# Patient Record
Sex: Male | Born: 1960 | Race: White | Hispanic: No | Marital: Married | Smoking: Former smoker
Health system: Southern US, Community
[De-identification: ages and names within clinical notes are randomized; demographics above are authoritative.]

## PROBLEM LIST (undated history)

## (undated) DIAGNOSIS — K219 Gastro-esophageal reflux disease without esophagitis: Secondary | ICD-10-CM

## (undated) DIAGNOSIS — F1911 Other psychoactive substance abuse, in remission: Secondary | ICD-10-CM

## (undated) DIAGNOSIS — F319 Bipolar disorder, unspecified: Secondary | ICD-10-CM

## (undated) DIAGNOSIS — M1990 Unspecified osteoarthritis, unspecified site: Secondary | ICD-10-CM

## (undated) DIAGNOSIS — I10 Essential (primary) hypertension: Secondary | ICD-10-CM

## (undated) DIAGNOSIS — E785 Hyperlipidemia, unspecified: Secondary | ICD-10-CM

## (undated) HISTORY — PX: COLON SURGERY: SHX602

## (undated) HISTORY — PX: CERVICAL FUSION: SHX112

## (undated) HISTORY — PX: CHOLECYSTECTOMY: SHX55

## (undated) HISTORY — PX: HERNIA REPAIR: SHX51

## (undated) HISTORY — PX: APPENDECTOMY: SHX54

## (undated) HISTORY — DX: Bipolar disorder, unspecified: F31.9

## (undated) HISTORY — DX: Unspecified osteoarthritis, unspecified site: M19.90

## (undated) HISTORY — DX: Hyperlipidemia, unspecified: E78.5

## (undated) HISTORY — DX: Gastro-esophageal reflux disease without esophagitis: K21.9

## (undated) HISTORY — DX: Essential (primary) hypertension: I10

## (undated) HISTORY — PX: ANKLE FRACTURE SURGERY: SHX122

---

## 2015-10-30 DIAGNOSIS — G43909 Migraine, unspecified, not intractable, without status migrainosus: Secondary | ICD-10-CM | POA: Insufficient documentation

## 2015-10-30 DIAGNOSIS — M5481 Occipital neuralgia: Secondary | ICD-10-CM | POA: Insufficient documentation

## 2015-10-30 DIAGNOSIS — F419 Anxiety disorder, unspecified: Secondary | ICD-10-CM | POA: Insufficient documentation

## 2015-10-30 DIAGNOSIS — F329 Major depressive disorder, single episode, unspecified: Secondary | ICD-10-CM | POA: Insufficient documentation

## 2015-10-30 DIAGNOSIS — M542 Cervicalgia: Secondary | ICD-10-CM | POA: Insufficient documentation

## 2015-10-30 DIAGNOSIS — R05 Cough: Secondary | ICD-10-CM | POA: Diagnosis not present

## 2015-10-30 DIAGNOSIS — J0140 Acute pansinusitis, unspecified: Secondary | ICD-10-CM | POA: Diagnosis not present

## 2015-10-30 DIAGNOSIS — R110 Nausea: Secondary | ICD-10-CM | POA: Insufficient documentation

## 2015-10-30 DIAGNOSIS — R0789 Other chest pain: Secondary | ICD-10-CM | POA: Diagnosis not present

## 2015-11-06 DIAGNOSIS — J189 Pneumonia, unspecified organism: Secondary | ICD-10-CM | POA: Diagnosis not present

## 2015-11-28 DIAGNOSIS — J189 Pneumonia, unspecified organism: Secondary | ICD-10-CM | POA: Diagnosis not present

## 2015-11-28 DIAGNOSIS — R05 Cough: Secondary | ICD-10-CM | POA: Diagnosis not present

## 2015-12-10 DIAGNOSIS — G4733 Obstructive sleep apnea (adult) (pediatric): Secondary | ICD-10-CM | POA: Diagnosis not present

## 2015-12-10 DIAGNOSIS — J181 Lobar pneumonia, unspecified organism: Secondary | ICD-10-CM | POA: Diagnosis not present

## 2015-12-10 DIAGNOSIS — R0602 Shortness of breath: Secondary | ICD-10-CM | POA: Diagnosis not present

## 2015-12-10 DIAGNOSIS — J986 Disorders of diaphragm: Secondary | ICD-10-CM | POA: Diagnosis not present

## 2015-12-13 DIAGNOSIS — Q791 Other congenital malformations of diaphragm: Secondary | ICD-10-CM | POA: Diagnosis not present

## 2015-12-13 DIAGNOSIS — J986 Disorders of diaphragm: Secondary | ICD-10-CM | POA: Diagnosis not present

## 2015-12-27 DIAGNOSIS — I10 Essential (primary) hypertension: Secondary | ICD-10-CM | POA: Diagnosis not present

## 2015-12-27 DIAGNOSIS — E785 Hyperlipidemia, unspecified: Secondary | ICD-10-CM | POA: Diagnosis not present

## 2015-12-27 DIAGNOSIS — Z0000 Encounter for general adult medical examination without abnormal findings: Secondary | ICD-10-CM | POA: Diagnosis not present

## 2016-01-02 DIAGNOSIS — Z125 Encounter for screening for malignant neoplasm of prostate: Secondary | ICD-10-CM | POA: Diagnosis not present

## 2016-01-02 DIAGNOSIS — R69 Illness, unspecified: Secondary | ICD-10-CM | POA: Diagnosis not present

## 2016-01-02 DIAGNOSIS — J309 Allergic rhinitis, unspecified: Secondary | ICD-10-CM | POA: Diagnosis not present

## 2016-01-02 DIAGNOSIS — Z0000 Encounter for general adult medical examination without abnormal findings: Secondary | ICD-10-CM | POA: Diagnosis not present

## 2016-01-02 DIAGNOSIS — K219 Gastro-esophageal reflux disease without esophagitis: Secondary | ICD-10-CM | POA: Diagnosis not present

## 2016-01-02 DIAGNOSIS — E785 Hyperlipidemia, unspecified: Secondary | ICD-10-CM | POA: Diagnosis not present

## 2016-01-02 DIAGNOSIS — K589 Irritable bowel syndrome without diarrhea: Secondary | ICD-10-CM | POA: Diagnosis not present

## 2016-01-02 DIAGNOSIS — I10 Essential (primary) hypertension: Secondary | ICD-10-CM | POA: Diagnosis not present

## 2016-01-02 DIAGNOSIS — G4733 Obstructive sleep apnea (adult) (pediatric): Secondary | ICD-10-CM | POA: Diagnosis not present

## 2016-03-19 DIAGNOSIS — R69 Illness, unspecified: Secondary | ICD-10-CM | POA: Diagnosis not present

## 2016-03-19 DIAGNOSIS — Z79899 Other long term (current) drug therapy: Secondary | ICD-10-CM | POA: Diagnosis not present

## 2016-05-04 ENCOUNTER — Encounter: Payer: Self-pay | Admitting: Physician Assistant

## 2016-05-04 ENCOUNTER — Ambulatory Visit (INDEPENDENT_AMBULATORY_CARE_PROVIDER_SITE_OTHER): Payer: Self-pay | Admitting: Physician Assistant

## 2016-05-04 VITALS — BP 123/77 | HR 107 | Ht 71.0 in | Wt 228.0 lb

## 2016-05-04 DIAGNOSIS — M62830 Muscle spasm of back: Secondary | ICD-10-CM

## 2016-05-04 DIAGNOSIS — M545 Low back pain: Secondary | ICD-10-CM

## 2016-05-04 MED ORDER — KETOROLAC TROMETHAMINE 60 MG/2ML IM SOLN
60.0000 mg | Freq: Once | INTRAMUSCULAR | Status: AC
Start: 2016-05-04 — End: 2016-05-04
  Administered 2016-05-04: 60 mg via INTRAMUSCULAR

## 2016-05-04 MED ORDER — METHYLPREDNISOLONE ACETATE 40 MG/ML IJ SUSP
40.0000 mg | Freq: Once | INTRAMUSCULAR | Status: AC
Start: 2016-05-04 — End: 2016-05-04
  Administered 2016-05-04: 40 mg via INTRAMUSCULAR

## 2016-05-04 MED ORDER — METHYLPREDNISOLONE SODIUM SUCC 125 MG IJ SOLR
125.0000 mg | Freq: Once | INTRAMUSCULAR | Status: AC
  Administered 2016-05-04: 125 mg via INTRAMUSCULAR

## 2016-05-04 MED ORDER — MELOXICAM 15 MG PO TABS: 15.0000 mg | ORAL_TABLET | Freq: Every day | ORAL | 1 refills | Status: DC

## 2016-05-04 NOTE — Patient Instructions (Addendum)
3 shots given today.  Consider massage, heat, ice, biofreeze.  Start [employer]. Can take with tylenol up to 4000mg  daily.  Do stretches/exercises 2-3 times a day.  No work until Friday.

## 2016-05-04 NOTE — Progress Notes (Signed)
Subjective:    Patient ID: Jake [name], male    DOB: 02-04-1961, 55 y.o.   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 Patient works for [employer] and does do a lot of lifting. On Thursday approximately 5 days ago he was doing some lifting and he felt a twinge in his left lower back. It went away and he continued working. 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 The pain gradually got worse during his shift. That night his whole left side radiating into his left posterior leg and into his calf worsen. At rest there is little to no pain. With any twisting or movement or lifting of left leg there is pain. He has some numbness and tingling radiating down his left leg but he does have a history of neuropathy. He denies any bowel or bladder dysfunction. He denies any saddle anesthesia. He is taking Tylenol regularly for the last 2 days with little relief.    .. Active Ambulatory Problems    Diagnosis Date Noted  . Low back pain 05/05/2016  . Muscle spasm of back 05/05/2016   Resolved Ambulatory Problems    Diagnosis Date Noted  . No Resolved Ambulatory Problems   Past Medical History:  Diagnosis Date  . Bipolar disorder ([place])   . GERD (gastroesophageal reflux disease)   . Hyperlipidemia   . Hypertension    .[name] Kitchen Family History  Problem Relation Age of Onset  . Cancer Mother     breast  . Depression Mother   . Hyperlipidemia Mother   . Alcohol abuse Father   . Cancer Father     skin  . Hyperlipidemia Father   . Alzheimer's disease Father   . Cancer Maternal Grandfather   . Cancer Paternal Grandmother    .[name] Kitchen Social History   Social History  . Marital status: Married    Spouse name: N/A  . Number of  children: N/A  . Years of education: N/A   Occupational History  . Not on file.   Social History Main Topics  . Smoking status: Former Research scientist (life sciences)  . Smokeless tobacco: Never Used  . Alcohol use No  . Drug use: No  . Sexual activity: Yes   Other Topics Concern  . Not on file   Social History Narrative  . No narrative on file     Review of Systems See HPI.     Objective:   Physical Exam  Constitutional: He is oriented to person, place, and time. He appears well-developed and well-nourished.  HENT:  Head: Normocephalic and atraumatic.  Musculoskeletal:  Rock hard tightness and tenderness over left lower back and paraspinous muscles. No pain over lumbar spine.  Left, Straight leg test increased pain in left lower back but no radiation of pain down left leg.  ROM at waist decreased in all directions due to pain.   Neurological: He is alert and oriented to person, place, and time.  Psychiatric: He has a normal mood and affect. His behavior is normal.          Assessment & Plan:  Left lower back pain with sciatca/muscle spasms of back- will avoid all addictive drugs with patient. Oral prednisone makes patient agitated. Solumedrol  125mg , Depo Medrol 40mg  and toradol 60mg  IM given today. Tomorrow start with mobic. Discussed low back exercises to do after heating back up then icing and using biofreeze. Consider tens unit. Written out of work through Thursday. May need to consider PT.since there is some sciatica involvement and has a hx of lumbar disc herniation need to consider disc herniation . Will continue to monitor. Follow up in 2-4 weeks or as needed.

## 2016-05-05 ENCOUNTER — Encounter: Payer: Self-pay | Admitting: Physician Assistant

## 2016-05-05 DIAGNOSIS — M62830 Muscle spasm of back: Secondary | ICD-10-CM | POA: Insufficient documentation

## 2016-05-05 DIAGNOSIS — M545 Low back pain, unspecified: Secondary | ICD-10-CM | POA: Insufficient documentation

## 2016-05-19 ENCOUNTER — Encounter: Payer: Self-pay | Admitting: Physician Assistant

## 2016-05-19 DIAGNOSIS — I10 Essential (primary) hypertension: Secondary | ICD-10-CM | POA: Insufficient documentation

## 2016-05-19 DIAGNOSIS — K5730 Diverticulosis of large intestine without perforation or abscess without bleeding: Secondary | ICD-10-CM | POA: Insufficient documentation

## 2016-05-19 DIAGNOSIS — J309 Allergic rhinitis, unspecified: Secondary | ICD-10-CM | POA: Insufficient documentation

## 2016-05-19 DIAGNOSIS — E781 Pure hyperglyceridemia: Secondary | ICD-10-CM | POA: Insufficient documentation

## 2016-05-19 DIAGNOSIS — K219 Gastro-esophageal reflux disease without esophagitis: Secondary | ICD-10-CM | POA: Insufficient documentation

## 2016-05-19 DIAGNOSIS — F411 Generalized anxiety disorder: Secondary | ICD-10-CM | POA: Insufficient documentation

## 2016-05-19 DIAGNOSIS — G4733 Obstructive sleep apnea (adult) (pediatric): Secondary | ICD-10-CM | POA: Insufficient documentation

## 2016-05-19 DIAGNOSIS — Z9989 Dependence on other enabling machines and devices: Secondary | ICD-10-CM | POA: Insufficient documentation

## 2016-05-19 DIAGNOSIS — K589 Irritable bowel syndrome without diarrhea: Secondary | ICD-10-CM | POA: Insufficient documentation

## 2016-05-19 DIAGNOSIS — E785 Hyperlipidemia, unspecified: Secondary | ICD-10-CM | POA: Insufficient documentation

## 2016-05-19 DIAGNOSIS — R4586 Emotional lability: Secondary | ICD-10-CM | POA: Insufficient documentation

## 2016-06-09 ENCOUNTER — Encounter: Payer: Self-pay | Admitting: Family Medicine

## 2016-06-09 ENCOUNTER — Ambulatory Visit (INDEPENDENT_AMBULATORY_CARE_PROVIDER_SITE_OTHER): Payer: Medicare HMO

## 2016-06-09 ENCOUNTER — Ambulatory Visit (INDEPENDENT_AMBULATORY_CARE_PROVIDER_SITE_OTHER): Payer: Medicare HMO | Admitting: Family Medicine

## 2016-06-09 VITALS — BP 143/85 | HR 96 | Wt 238.0 lb

## 2016-06-09 DIAGNOSIS — M5442 Lumbago with sciatica, left side: Secondary | ICD-10-CM

## 2016-06-09 DIAGNOSIS — M4726 Other spondylosis with radiculopathy, lumbar region: Secondary | ICD-10-CM | POA: Diagnosis not present

## 2016-06-09 DIAGNOSIS — M545 Low back pain: Secondary | ICD-10-CM

## 2016-06-09 DIAGNOSIS — M5441 Lumbago with sciatica, right side: Secondary | ICD-10-CM

## 2016-06-09 MED ORDER — METHYLPREDNISOLONE ACETATE 80 MG/ML IJ SUSP
80.0000 mg | Freq: Once | INTRAMUSCULAR | Status: AC
  Administered 2016-06-09: 80 mg via INTRAMUSCULAR

## 2016-06-09 NOTE — Progress Notes (Signed)
   Subjective:    I'm seeing this patient as a consultation for:  [name], PA-C   CC: Back pain  HPI: Patient notes a several day history of left-sided low back pain radiating to the left leg. Symptoms are moderate to severe however he denies any weakness or numbness. Leg pain is worse with sitting and laying extension. He had a similar episode a month ago that was treated with in the office intramuscular steroid injections. He notes he is intolerant to high-dose long-term oral steroids due to mental health issues. He denies any injury or bladder dysfunction fevers or chills. He's tried meloxicam which did not help his current pain.  Past medical history, Surgical history, Family history not pertinant except as noted below, Social history, Allergies, and medications have been entered into the medical record, reviewed, and no changes needed.   Review of Systems: No headache, visual changes, nausea, vomiting, diarrhea, constipation, dizziness, abdominal pain, skin rash, fevers, chills, night sweats, weight loss, swollen lymph nodes, body aches, joint swelling, muscle aches, chest pain, shortness of breath, mood changes, visual or auditory hallucinations.   Objective:    Vitals:   06/09/16 1114  BP: (!) 143/85  Pulse: 96   General: Well Developed, well nourished, and in no acute distress.  Neuro/Psych: Alert and oriented x3, extra-ocular muscles intact, able to move all 4 extremities, sensation grossly intact. Skin: Warm and dry, no rashes noted.  Respiratory: Not using accessory muscles, speaking in full sentences, trachea midline.  Cardiovascular: Pulses palpable, no extremity edema. Abdomen: Does not appear distended. MSK: L spine: Nontender to midline. Tender palpation left lumbar paraspinal muscle group. Lumbar motion is limited in flexion by pain. Positive left-sided slump test. Lower extremity reflexes strength and sensation are equal and normal throughout. Normal  gait.   No results found for this or any previous visit (from the past 24 hour(s)). No results found.  Impression and Recommendations:    Assessment and Plan: 55 y.o. male with Lumbago with left-sided sciatica. 80 mg of intramuscular Depo-Medrol were given prior to discharge. Additionally we'll obtain x-ray of lumbar spine and refer to physical therapy. If worsening will return to clinic soon otherwise recheck in 4 weeks. If not better would recommend MRI for epidural steroid injection planning..   Discussed warning signs or symptoms. Please see discharge instructions. Patient expresses understanding.

## 2016-06-09 NOTE — Patient Instructions (Signed)
Thank you for coming in today. Attend PT.  Return in 4 weeks or sooner if needed.  Come back or go to the emergency room if you notice new weakness new numbness problems walking or bowel or bladder problems.   Sciatica Sciatica is pain, weakness, numbness, or tingling along the path of the sciatic nerve. The nerve starts in the lower back and runs down the back of each leg. The nerve controls the muscles in the lower leg and in the back of the knee, while also providing sensation to the back of the thigh, lower leg, and the sole of your foot. Sciatica is a symptom of another medical condition. For instance, nerve damage or certain conditions, such as a herniated disk or bone spur on the spine, pinch or put pressure on the sciatic nerve. This causes the pain, weakness, or other sensations normally associated with sciatica. Generally, sciatica only affects one side of the body. CAUSES   Herniated or slipped disc.  Degenerative disk disease.  A pain disorder involving the narrow muscle in the buttocks (piriformis syndrome).  Pelvic injury or fracture.  Pregnancy.  Tumor (rare). SYMPTOMS  Symptoms can vary from mild to very severe. The symptoms usually travel from the low back to the buttocks and down the back of the leg. Symptoms can include:  Mild tingling or dull aches in the lower back, leg, or hip.  Numbness in the back of the calf or sole of the foot.  Burning sensations in the lower back, leg, or hip.  Sharp pains in the lower back, leg, or hip.  Leg weakness.  Severe back pain inhibiting movement. These symptoms may get worse with coughing, sneezing, laughing, or prolonged sitting or standing. Also, being overweight may worsen symptoms. DIAGNOSIS  Your caregiver will perform a physical exam to look for common symptoms of sciatica. He or she may ask you to do certain movements or activities that would trigger sciatic nerve pain. Other tests may be performed to find the cause  of the sciatica. These may include:  Blood tests.  X-rays.  Imaging tests, such as an MRI or CT scan. TREATMENT  Treatment is directed at the cause of the sciatic pain. Sometimes, treatment is not necessary and the pain and discomfort goes away on its own. If treatment is needed, your caregiver may suggest:  Over-the-counter medicines to relieve pain.  Prescription medicines, such as anti-inflammatory medicine, muscle relaxants, or narcotics.  Applying heat or ice to the painful area.  Steroid injections to lessen pain, irritation, and inflammation around the nerve.  Reducing activity during periods of pain.  Exercising and stretching to strengthen your abdomen and improve flexibility of your spine. Your caregiver may suggest losing weight if the extra weight makes the back pain worse.  Physical therapy.  Surgery to eliminate what is pressing or pinching the nerve, such as a bone spur or part of a herniated disk. HOME CARE INSTRUCTIONS   Only take over-the-counter or prescription medicines for pain or discomfort as directed by your caregiver.  Apply ice to the affected area for 20 minutes, 3-4 times a day for the first 48-72 hours. Then try heat in the same way.  Exercise, stretch, or perform your usual activities if these do not aggravate your pain.  Attend physical therapy sessions as directed by your caregiver.  Keep all follow-up appointments as directed by your caregiver.  Do not wear high heels or shoes that do not provide proper support.  Check your mattress to  see if it is too soft. A firm mattress may lessen your pain and discomfort. SEEK IMMEDIATE MEDICAL CARE IF:   You lose control of your bowel or bladder (incontinence).  You have increasing weakness in the lower back, pelvis, buttocks, or legs.  You have redness or swelling of your back.  You have a burning sensation when you urinate.  You have pain that gets worse when you lie down or awakens you at  night.  Your pain is worse than you have experienced in the past.  Your pain is lasting longer than 4 weeks.  You are suddenly losing weight without reason. MAKE SURE YOU:  Understand these instructions.  Will watch your condition.  Will get help right away if you are not doing well or get worse.   This information is not intended to replace advice given to you by your health care provider. Make sure you discuss any questions you have with your health care provider.   Document Released: 08/18/2001 Document Revised: 05/15/2015 Document Reviewed: 01/03/2012 Elsevier Interactive Patient Education [employer].

## 2016-06-14 DIAGNOSIS — R69 Illness, unspecified: Secondary | ICD-10-CM | POA: Diagnosis not present

## 2016-06-16 ENCOUNTER — Other Ambulatory Visit: Payer: Self-pay | Admitting: *Deleted

## 2016-06-16 MED ORDER — SODIUM BICARBONATE 650 MG PO TABS: ORAL_TABLET | ORAL | 3 refills | Status: DC

## 2016-06-26 ENCOUNTER — Encounter: Payer: Self-pay | Admitting: Rehabilitative and Restorative Service Providers"

## 2016-06-26 ENCOUNTER — Ambulatory Visit (INDEPENDENT_AMBULATORY_CARE_PROVIDER_SITE_OTHER): Payer: Medicare HMO | Admitting: Rehabilitative and Restorative Service Providers"

## 2016-06-26 DIAGNOSIS — M545 Low back pain: Secondary | ICD-10-CM

## 2016-06-26 DIAGNOSIS — R29898 Other symptoms and signs involving the musculoskeletal system: Secondary | ICD-10-CM

## 2016-06-26 NOTE — Patient Instructions (Signed)
Abdominal Bracing With Pelvic Floor (Hook-Lying)    With neutral spine, tighten pelvic floor and abdominals sucking belly button to back bone; tighten muscles in LB at waist. Hold 10 sec  Repeat _10__ times. Do __several_ times a day. Progress to do this in sitting; standing; walking and with functional activities.  Trunk: Prone Extension (Press-Ups)    Lie on stomach on firm, flat surface. Relax bottom and legs. Raise chest in air with elbows straight. Keep hips flat on surface, sag stomach. Hold __2-3__ seconds. Repeat _10___ times. Do _4-5___ sessions per day. CAUTION: Movement should be gentle and slow. Do this if LE pain increases   Elbow Prop (Extension)    Prop body up on elbows for _1-2 minutes. Slowly lower it. Do __2-3 __ sessions per day.   Backward Bend (Standing)    Arch backward to make hollow of back deeper. Hold _2-3___ seconds. Repeat __-3__ times per set. Do _several___ sessions per day.   TENS UNIT: This is helpful for muscle pain and spasm.   Search and Purchase a TENS 7000 2nd edition at www.tenspros.com. It should be less than $30.     TENS unit instructions: Do not shower or bathe with the unit on Turn the unit off before removing electrodes or batteries If the electrodes lose stickiness add a drop of water to the electrodes after they are disconnected from the unit and place on plastic sheet. If you continued to have difficulty, call the TENS unit company to purchase more electrodes. Do not apply lotion on the skin area prior to use. Make sure the skin is clean and dry as this will help prolong the life of the electrodes. After use, always check skin for unusual red areas, rash or other skin difficulties. If there are any skin problems, does not apply electrodes to the same area. Never remove the electrodes from the unit by pulling the wires. Do not use the TENS unit or electrodes other than as directed. Do not change electrode placement without  consultating your therapist or physician. Keep 2 fingers with between each electrode.   Sleeping on Back  Place pillow under knees. A pillow with cervical support and a roll around waist are also helpful. Copyright  VHI. All rights reserved.  Sleeping on Side Place pillow between knees. Use cervical support under neck and a roll around waist as needed. Copyright  VHI. All rights reserved.   Sleeping on Stomach   If this is the only desirable sleeping position, place pillow under lower legs, and under stomach or chest as needed.  Posture - Sitting   Sit upright, head facing forward. Try using a roll to support lower back. Keep shoulders relaxed, and avoid rounded back. Keep hips level with knees. Avoid crossing legs for long periods. Stand to Sit / Sit to Stand   To sit: Bend knees to lower self onto front edge of chair, then scoot back on seat. To stand: Reverse sequence by placing one foot forward, and scoot to front of seat. Use rocking motion to stand up.   Work Height and Reach  Ideal work height is no more than 2 to 4 inches below elbow level when standing, and at elbow level when sitting. Reaching should be limited to arm's length, with elbows slightly bent.  Bending  Bend at hips and knees, not back. Keep feet shoulder-width apart.    Posture - Standing   Good posture is important. Avoid slouching and forward head thrust. Maintain curve in low  back and align ears over shoul- ders, hips over ankles.  Alternating Positions   Alternate tasks and change positions frequently to reduce fatigue and muscle tension. Take rest breaks. Computer Work   Position work to Programmer, multimedia. Use proper work and seat height. Keep shoulders back and down, wrists straight, and elbows at right angles. Use chair that provides full back support. Add footrest and lumbar roll as needed.  Getting Into / Out of Car  Lower self onto seat, scoot back, then bring in one leg at a time.  Reverse sequence to get out.  Dressing  Lie on back to pull socks or slacks over feet, or sit and bend leg while keeping back straight.    Housework - Sink  Place one foot on ledge of cabinet under sink when standing at sink for prolonged periods.   Pushing / Pulling  Pushing is preferable to pulling. Keep back in proper alignment, and use leg muscles to do the work.  Deep Squat   Squat and lift with both arms held against upper trunk. Tighten stomach muscles without holding breath. Use smooth movements to avoid jerking.  Avoid Twisting   Avoid twisting or bending back. Pivot around using foot movements, and bend at knees if needed when reaching for articles.  Carrying Luggage   Distribute weight evenly on both sides. Use a cart whenever possible. Do not twist trunk. Move body as a unit.   Lifting Principles .Maintain proper posture and head alignment. .Slide object as close as possible before lifting. .Move obstacles out of the way. .Test before lifting; ask for help if too heavy. .Tighten stomach muscles without holding breath. .Use smooth movements; do not jerk. .Use legs to do the work, and pivot with feet. .Distribute the work load symmetrically and close to the center of trunk. .Push instead of pull whenever possible.   Ask For Help   Ask for help and delegate to others when possible. Coordinate your movements when lifting together, and maintain the low back curve.  Log Roll   Lying on back, bend left knee and place left arm across chest. Roll all in one movement to the right. Reverse to roll to the left. Always move as one unit. Housework - Sweeping  Use long-handled equipment to avoid stooping.   Housework - Wiping  Position yourself as close as possible to reach work surface. Avoid straining your back.  Laundry - Unloading Wash   To unload small items at bottom of washer, lift leg opposite to arm being used to reach.  [place] close to area to be raked. Use arm movements to do the work. Keep back straight and avoid twisting.     Cart  When reaching into cart with one arm, lift opposite leg to keep back straight.   Getting Into / Out of Bed  Lower self to lie down on one side by raising legs and lowering head at the same time. Use arms to assist moving without twisting. Bend both knees to roll onto back if desired. To sit up, start from lying on side, and use same move-ments in reverse. Housework - Vacuuming  Hold the vacuum with arm held at side. Step back and forth to move it, keeping head up. Avoid twisting.   Laundry - IT consultant so that bending and twisting can be avoided.   Laundry - Unloading Dryer  Squat down to reach into clothes dryer or use a reacher.  Gardening - Weeding / Probation officer or Kneel. Knee pads may be helpful.

## 2016-06-26 NOTE — Therapy (Addendum)
[place], [place], [address] Phone: [phone]   Fax:  [phone]  Physical Therapy Evaluation  Patient Details  Name: Jake [name] MRN: [id number] Date of Birth: 1960/12/23 Referring Provider: Dr Lynne Leader   Encounter Date: 06/26/2016      PT End of Session - 06/26/16 1448    Visit Number 1   Number of Visits 12   Date for PT Re-Evaluation 08/07/16   PT Start Time 7824   PT Stop Time 1448   PT Time Calculation (min) 61 min   Activity Tolerance Patient tolerated treatment well      Past Medical History:  Diagnosis Date  . Bipolar disorder ([place])   . GERD (gastroesophageal reflux disease)   . Hyperlipidemia   . Hypertension     Past Surgical History:  Procedure Laterality Date  . ANKLE FRACTURE SURGERY Left   . APPENDECTOMY    . CERVICAL FUSION    . CHOLECYSTECTOMY    . COLON SURGERY    . HERNIA REPAIR      There were no vitals filed for this visit.       Subjective Assessment - 06/26/16 1350    Subjective Patient reports 3-4 week history of LBP and Lt LE pain after lifting something wrong at work. He felt a pop and then experienced pain from the LB into the back of both legs - was just in the Lt LE but is now in both.    Pertinent History HNP x2 lumbar spine ~ 12 years ago; cervical disc surgery C6/7 fusion ~15 yrs ago; gall bladder colon; 2 hernia surgeries within the past 10 years; Lt ankle surgery 1982; peripherial neuropathy bilat feet x 3 yrs no known cause    How long can you sit comfortably? 5-10 min    How long can you stand comfortably? 1 hour    How long can you walk comfortably? 2 hours    Diagnostic tests xrays    Patient Stated Goals get rid of the pain    Currently in Pain? Yes   Pain Score 5    Pain Location Back   Pain Orientation Lower;Mid   Pain Descriptors / Indicators Throbbing   Pain Type Acute pain   Pain Radiating Towards into both buttocks and  posterior thighs to knees    Pain Onset More than a month ago   Pain Frequency Constant   Aggravating Factors  sitting; bend forward; lifting; holding weight in front away from body   Pain Relieving Factors heat; tylenol             [facility] PT Assessment - 06/26/16 0001      Assessment   Medical Diagnosis LBP; bilat LE pain    Referring Provider Dr Lynne Leader    Onset Date/Surgical Date 05/27/16   Hand Dominance Right   Next MD Visit 07/07/16   Prior Therapy yes years ago for HNP      Precautions   Precautions None;Other (comment)   Precaution Comments no heavy lifting      Balance Screen   Has the patient fallen in the past 6 months No   Has the patient had a decrease in activity level because of a fear of falling?  No   Is the patient reluctant to leave their home because of a fear of falling?  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 Standing - head forward; shoudlers rounded and elevated; increased thoracic kyphosis; scapulae abducted and rotated along the thoracic wall; slightly flexed forward at hips      AROM   Overall AROM Comments tightness noted through end ranges bilat hips    AROM Assessment Site --  radiating pain bilat LE's with fwd flex; lbp with ext    Lumbar Flexion 60%   Lumbar Extension 50%   Lumbar - Right Side Bend 65%   Lumbar - Left Side Bend 60%   Lumbar - Right Rotation 30%   Lumbar - Left Rotation 20%     Strength   Overall  Strength Comments 5/5 bilat LE's pain with hip extension    Strength Assessment Site --  poor core/abdominal bracing      Flexibility   Hamstrings tight with LE pain at ~ 65 deg 60 deg Lt    Quadriceps tight ~ 115 deg bilat with increase in LBP    ITB tight bilat with increase in LBP    Piriformis tight Lt > Rt      Palpation   Spinal mobility pain with CPA and UPA - L1 to L5    Palpation comment pain with palpation bilat lumbar and QL; Lt piriformis and hip abductors      Special Tests    Special Tests --  (+) SLR at 65 deg Rt; 60 deg LT; pain w/ figure 4                   [facility] Adult PT Treatment/Exercise - 06/26/16 0001      Therapeutic Activites    Therapeutic Activities --  education re sitting & lying positions/spine care initiated     Lumbar Exercises: Stretches   Standing Extension --  3 reps x 2-3 sec    Prone on Elbows Stretch --  2-3 min x 1   Press Ups --  2-3 sec x 10      Lumbar Exercises: Supine   Ab Set --  trial of 3 part core - patient has difficulty w/ ab bracing   AB Set Limitations unable to preform core stabilization with mult trials including verbal and tactile cues - will continue to try belly button to back bone at home  - no pain with efforts but he does hold his breath      Moist Heat Therapy   Number Minutes Moist Heat 20 Minutes   Moist Heat Location Lumbar Spine     Electrical Stimulation   Electrical Stimulation Location bilat lumbar   Electrical Stimulation Action IFC   Electrical Stimulation Parameters to tolerance   Electrical Stimulation Goals Pain                PT Education - 06/26/16 1445    Education provided Yes   Education Details HEP - extension program; TENS; back care    Person(s) Educated Patient   Methods Explanation;Demonstration;Tactile cues;Verbal cues;Handout   Comprehension Verbalized understanding;Returned demonstration;Verbal cues required;Tactile cues required  PT Long  Term Goals - 06/26/16 1502      PT LONG TERM GOAL #1   Title Imrprove core stabilization allowing patient to preform 10-15 min of core stabilization exercises without difficulty 08/07/16   Time 6   Period Weeks   Status New     PT LONG TERM GOAL #2   Title Improve sitting tolerance to 20-30 min with no increase in radicular symptoms 08/07/16   Time 6   Period Weeks   Status New     PT LONG TERM GOAL #3   Title Decrease radicular LE pain with patient to report resolution of bilat buttock and posterior thigh pain 80-90% of the day 08/07/16   Time 6   Period Weeks   Status New     PT LONG TERM GOAL #4   Title Independent in HEP and proper back care 08/07/16   Time 6   Period Weeks   Status New     PT LONG TERM GOAL #5   Title Improve FOTO to </= 31% limitation 08/07/16   Time 6   Period Weeks   Status New               Plan - 06/26/16 1449    Clinical Impression Statement Patient presents with signs and symptoms consistent with lumbar dysfunction which is discogenic in nature. He has a history of recurrent intermittent LBP in the past; limited trunk and LE mobilty and ROM; pain with spring testing lumbar spine; pain at rest and with functional activities.    Rehab Potential Good   PT Frequency 2x / week   PT Duration 6 weeks   PT Treatment/Interventions Patient/family education;ADLs/Self Care Home Management;Cryotherapy;Electrical Stimulation;Iontophoresis 73m/ml Dexamethasone;Moist Heat;Traction;Ultrasound;Dry needling;Manual techniques;Therapeutic activities;Therapeutic exercise   PT Next Visit Plan progress with extension program; core stabilization; manaul work as indicated; consider trial of lumbar traction if radicular symptoms have not responded to extension and neutral spine rest   Consulted and Agree with Plan of Care Patient      Patient will benefit from skilled therapeutic intervention in order to improve the following deficits and impairments:  Postural  dysfunction, Improper body mechanics, Pain, Increased muscle spasms, Decreased range of motion, Decreased mobility, Decreased activity tolerance  Visit Diagnosis: Acute bilateral low back pain, with sciatica presence unspecified - Plan: PT plan of care cert/re-cert  Other symptoms and signs involving the musculoskeletal system - Plan: PT plan of care cert/re-cert     Problem List Patient Active Problem List   Diagnosis Date Noted  . Hyperlipidemia 05/19/2016  . Hypertriglyceridemia 05/19/2016  . Diverticulosis of colon without hemorrhage 05/19/2016  . IBS (irritable bowel syndrome) 05/19/2016  . Essential hypertension, benign 05/19/2016  . GERD (gastroesophageal reflux disease) 05/19/2016  . Allergic rhinitis 05/19/2016  . OSA on CPAP 05/19/2016  . Generalized anxiety disorder 05/19/2016  . Mood changes ([place] 05/19/2016  . Low back pain 05/05/2016  . Muscle spasm of back 05/05/2016    Jake [name] PNilda SimmerPT, MPH  06/26/2016, 3:09 PM  [facility] [address] [address]Phone: [phone]  Fax:  [phone] Name: Jake FortinMRN: [id number] of Birth: [birth year] PHYSICAL THERAPY DISCHARGE SUMMARY  Visits from Start of Care: Evaluation only  Current functional level related to goals / functional outcomes: Unchanged    Remaining deficits: Unchanged    Education / Equipment: Initial home exercise program Plan: Patient agrees to discharge.  Patient goals were not  met. Patient is being discharged due to not returning since the last visit.  ?????    Jake Gutierres P. Helene Kelp PT, MPH 08/19/16 10:07 AM

## 2016-07-07 ENCOUNTER — Ambulatory Visit: Payer: Medicare HMO | Admitting: Family Medicine

## 2016-07-22 ENCOUNTER — Other Ambulatory Visit: Payer: Self-pay | Admitting: Physician Assistant

## 2016-07-28 ENCOUNTER — Other Ambulatory Visit: Payer: Self-pay | Admitting: *Deleted

## 2016-07-28 ENCOUNTER — Telehealth: Payer: Self-pay | Admitting: *Deleted

## 2016-07-28 MED ORDER — PANTOPRAZOLE SODIUM 40 MG PO TBEC: 40.0000 mg | DELAYED_RELEASE_TABLET | Freq: Every day | ORAL | 2 refills | Status: DC

## 2016-07-28 MED ORDER — LOSARTAN POTASSIUM 50 MG PO TABS: 50.0000 mg | ORAL_TABLET | Freq: Every day | ORAL | 2 refills | Status: DC

## 2016-07-28 MED ORDER — DICYCLOMINE HCL 20 MG PO TABS: 20.0000 mg | ORAL_TABLET | Freq: Three times a day (TID) | ORAL | 2 refills | Status: DC

## 2016-07-28 NOTE — Telephone Encounter (Signed)
Ok to refill for 2 months. Need med follow up before next refill to check BP.

## 2016-07-28 NOTE — Telephone Encounter (Signed)
Refill request for Dicyclomine, pantoprazole and losartan. I have to route  These and initial fill sent by provider because they are all historical medications that have not been prescribed by our office

## 2016-07-29 ENCOUNTER — Other Ambulatory Visit: Payer: Self-pay

## 2016-07-29 MED ORDER — SODIUM BICARBONATE 650 MG PO TABS: ORAL_TABLET | ORAL | 3 refills | Status: DC

## 2016-08-19 ENCOUNTER — Other Ambulatory Visit: Payer: Self-pay | Admitting: *Deleted

## 2016-08-19 MED ORDER — PANTOPRAZOLE SODIUM 40 MG PO TBEC: 40.0000 mg | DELAYED_RELEASE_TABLET | Freq: Two times a day (BID) | ORAL | 2 refills | Status: DC

## 2016-10-12 DIAGNOSIS — R69 Illness, unspecified: Secondary | ICD-10-CM | POA: Diagnosis not present

## 2016-10-31 ENCOUNTER — Other Ambulatory Visit: Payer: Self-pay | Admitting: Physician Assistant

## 2016-11-13 ENCOUNTER — Ambulatory Visit (INDEPENDENT_AMBULATORY_CARE_PROVIDER_SITE_OTHER): Payer: Medicare HMO

## 2016-11-13 ENCOUNTER — Other Ambulatory Visit: Payer: Self-pay | Admitting: Physician Assistant

## 2016-11-13 ENCOUNTER — Encounter: Payer: Self-pay | Admitting: Physician Assistant

## 2016-11-13 ENCOUNTER — Ambulatory Visit (INDEPENDENT_AMBULATORY_CARE_PROVIDER_SITE_OTHER): Payer: Medicare HMO | Admitting: Physician Assistant

## 2016-11-13 VITALS — BP 150/82 | HR 99 | Temp 97.7°F | Ht 71.0 in | Wt 239.0 lb

## 2016-11-13 DIAGNOSIS — R059 Cough, unspecified: Secondary | ICD-10-CM

## 2016-11-13 DIAGNOSIS — R05 Cough: Secondary | ICD-10-CM | POA: Diagnosis not present

## 2016-11-13 DIAGNOSIS — R079 Chest pain, unspecified: Secondary | ICD-10-CM

## 2016-11-13 DIAGNOSIS — J189 Pneumonia, unspecified organism: Secondary | ICD-10-CM

## 2016-11-13 DIAGNOSIS — J181 Lobar pneumonia, unspecified organism: Secondary | ICD-10-CM

## 2016-11-13 DIAGNOSIS — R0989 Other specified symptoms and signs involving the circulatory and respiratory systems: Secondary | ICD-10-CM

## 2016-11-13 DIAGNOSIS — R062 Wheezing: Secondary | ICD-10-CM | POA: Diagnosis not present

## 2016-11-13 MED ORDER — LEVOFLOXACIN 750 MG PO TABS: 750.0000 mg | ORAL_TABLET | Freq: Every day | ORAL | 0 refills | Status: DC

## 2016-11-13 MED ORDER — METHYLPREDNISOLONE SODIUM SUCC 125 MG IJ SOLR
125.0000 mg | Freq: Once | INTRAMUSCULAR | Status: AC
  Administered 2016-11-13: 125 mg via INTRAMUSCULAR

## 2016-11-13 MED ORDER — IPRATROPIUM-ALBUTEROL 0.5-2.5 (3) MG/3ML IN SOLN
3.0000 mL | Freq: Once | RESPIRATORY_TRACT | Status: AC
  Administered 2016-11-13: 3 mL via RESPIRATORY_TRACT

## 2016-11-13 NOTE — Progress Notes (Signed)
   Subjective:    Patient ID: Nochum Fenter, male    DOB: 26-Apr-1961, 56 y.o.   MRN: [id number]  HPI  Pt is a 56 yo male who presents to the clinic with cough, SOB, chest tightness, fatigue, weakness. He had albuterol from previous dx of pneumonia. Albuterol helping some. Taking OtC mucinex and benadral with little relief. Hurts to cough. Not able to cough up anything. Low grade fever at home. No energy.    Review of Systems    see HPI.  Objective:   Physical Exam  Constitutional: He is oriented to person, place, and time. He appears well-developed and well-nourished.  HENT:  Head: Normocephalic and atraumatic.  Eyes: Conjunctivae are normal. Right eye exhibits no discharge. Left eye exhibits no discharge.  Neck: Normal range of motion. Neck supple.  Cardiovascular: Normal rate, regular rhythm and normal heart sounds.   Pulmonary/Chest: Effort normal and breath sounds normal. He has no wheezes.  Lymphadenopathy:    He has no cervical adenopathy.  Neurological: He is alert and oriented to person, place, and time.  Psychiatric: He has a normal mood and affect. His behavior is normal.          Assessment & Plan:  [name] KitchenMarland KitchenHolley was seen today for cough and headache.  Diagnoses and all orders for this visit:  Community acquired pneumonia of left lower lobe of lung (HCC) -     methylPREDNISolone sodium succinate (SOLU-MEDROL) 125 mg/2 mL injection 125 mg; Inject 2 mLs (125 mg total) into the muscle once.  Cough -     ipratropium-albuterol (DUONEB) 0.5-2.5 (3) MG/3ML nebulizer solution 3 mL; Take 3 mLs by nebulization once.  Wheezing -     ipratropium-albuterol (DUONEB) 0.5-2.5 (3) MG/3ML nebulizer solution 3 mL; Take 3 mLs by nebulization once.  Other orders -     Cancel: DG Chest 2 View -     levofloxacin (LEVAQUIN) 750 MG tablet; Take 1 tablet (750 mg total) by mouth daily.  duoneb given in office today.  CXR showed infiltrate at lower left lung.will treat for pneumonia Pulse  ox 93 percent.  Solumedrol 125mg  given in office today.  Start levaquin for 10 days.  Continue albuterol as needed.  Discussed deep breathing exercises.

## 2016-11-13 NOTE — Patient Instructions (Signed)
Community-Acquired Pneumonia, Adult °Pneumonia is an infection of the lungs. One type of pneumonia can happen while a person is in a hospital. A different type can happen when a person is not in a hospital (community-acquired pneumonia). It is easy for this kind to spread from person to person. It can spread to you if you breathe near an infected person who coughs or sneezes. Some symptoms include: °· A dry cough. °· A wet (productive) cough. °· Fever. °· Sweating. °· Chest pain. °Follow these instructions at home: °· Take over-the-counter and prescription medicines only as told by your doctor. °¨ Only take cough medicine if you are losing sleep. °¨ If you were prescribed an antibiotic medicine, take it as told by your doctor. Do not stop taking the antibiotic even if you start to feel better. °· Sleep with your head and neck raised (elevated). You can do this by putting a few pillows under your head, or you can sleep in a recliner. °· Do not use tobacco products. These include cigarettes, chewing tobacco, and e-cigarettes. If you need help quitting, ask your doctor. °· Drink enough water to keep your pee (urine) clear or pale yellow. °A shot (vaccine) can help prevent pneumonia. Shots are often suggested for: °· People older than 56 years of age. °· People older than 56 years of age: °¨ Who are having cancer treatment. °¨ Who have long-term (chronic) lung disease. °¨ Who have problems with their body's defense system (immune system). °You may also prevent pneumonia if you take these actions: °· Get the flu (influenza) shot every year. °· Go to the dentist as often as told. °· Wash your hands often. If soap and water are not available, use hand sanitizer. °Contact a doctor if: °· You have a fever. °· You lose sleep because your cough medicine does not help. °Get help right away if: °· You are short of breath and it gets worse. °· You have more chest pain. °· Your sickness gets worse. This is very serious if: °¨ You  are an older adult. °¨ Your body's defense system is weak. °· You cough up blood. °This information is not intended to replace advice given to you by your health care provider. Make sure you discuss any questions you have with your health care provider. °Document Released: 02/10/2008 Document Revised: 01/30/2016 Document Reviewed: 12/19/2014 °Elsevier Interactive Patient Education © 2017 [employer] Inc. ° °

## 2016-11-13 NOTE — Addendum Note (Signed)
Addended by: Donella Stade on: 11/13/2016 05:11 PM   Modules accepted: Level of Service

## 2016-11-16 ENCOUNTER — Other Ambulatory Visit: Payer: Self-pay | Admitting: Physician Assistant

## 2016-11-20 ENCOUNTER — Encounter: Payer: Self-pay | Admitting: Physician Assistant

## 2016-11-20 ENCOUNTER — Ambulatory Visit (INDEPENDENT_AMBULATORY_CARE_PROVIDER_SITE_OTHER): Payer: Medicare HMO | Admitting: Physician Assistant

## 2016-11-20 VITALS — BP 97/67 | HR 102 | Ht 71.0 in | Wt 236.0 lb

## 2016-11-20 DIAGNOSIS — J9811 Atelectasis: Secondary | ICD-10-CM

## 2016-11-20 DIAGNOSIS — J189 Pneumonia, unspecified organism: Secondary | ICD-10-CM | POA: Insufficient documentation

## 2016-11-20 DIAGNOSIS — J181 Lobar pneumonia, unspecified organism: Secondary | ICD-10-CM

## 2016-11-20 MED ORDER — METHYLPREDNISOLONE ACETATE 80 MG/ML IJ SUSP
80.0000 mg | Freq: Once | INTRAMUSCULAR | Status: AC
  Administered 2016-11-20: 80 mg via INTRAMUSCULAR

## 2016-11-20 MED ORDER — METHYLPREDNISOLONE 4 MG PO TBPK: ORAL_TABLET | ORAL | 0 refills | Status: DC

## 2016-11-20 MED ORDER — METHYLPREDNISOLONE SODIUM SUCC 125 MG IJ SOLR
125.0000 mg | Freq: Once | INTRAMUSCULAR | Status: AC
  Administered 2016-11-20: 125 mg via INTRAMUSCULAR

## 2016-11-20 NOTE — Patient Instructions (Signed)
4 times a day lung rehab with peak flow meter.  2 times a day albuterol inhaler.  flinish levaquin.  2 steroid shots given in office today.  If needed medrol dose pak.

## 2016-11-20 NOTE — Progress Notes (Signed)
   Subjective:    Patient ID: Jake [name], male    DOB: 25-May-1961, 56 y.o.   MRN: [id number]  HPI Pt is a 56 yo male who presents to the clinic for 1 week follow up on CAP left lobe pneumonia. He feels 70 percent better but still complains of SOB, cough, wheezing. No fever, chills body aches. Cough is mildly productive. He still has 2 days of levaquin left. He is using as needed albuterol inhaler.   Review of Systems See HPI.     Objective:   Physical Exam  Constitutional: He is oriented to person, place, and time. He appears well-developed and well-nourished.  HENT:  Head: Normocephalic and atraumatic.  Right Ear: External ear normal.  Left Ear: External ear normal.  Nose: Nose normal.  Mouth/Throat: Oropharynx is clear and moist. No oropharyngeal exudate.  Eyes: Conjunctivae are normal. Right eye exhibits no discharge. Left eye exhibits no discharge.  Neck: Normal range of motion. Neck supple.  Cardiovascular: Normal rate, regular rhythm and normal heart sounds.   Pulmonary/Chest: Effort normal.  Crackles at the bilateral lung bases. No wheezing or rhonchi.  Lymphadenopathy:    He has no cervical adenopathy.  Neurological: He is alert and oriented to person, place, and time.  Psychiatric: He has a normal mood and affect. His behavior is normal.          Assessment & Plan:  [name] KitchenMarland KitchenDiagnoses and all orders for this visit:  Community acquired pneumonia of left lower lobe of lung (HCC) -     methylPREDNISolone sodium succinate (SOLU-MEDROL) 125 mg/2 mL injection 125 mg; Inject 2 mLs (125 mg total) into the muscle once. -     methylPREDNISolone acetate (DEPO-MEDROL) injection 80 mg; Inject 1 mL (80 mg total) into the muscle once.  Atelectasis of both lungs -     methylPREDNISolone sodium succinate (SOLU-MEDROL) 125 mg/2 mL injection 125 mg; Inject 2 mLs (125 mg total) into the muscle once. -     methylPREDNISolone acetate (DEPO-MEDROL) injection 80 mg; Inject 1 mL (80 mg total)  into the muscle once.  Other orders -     methylPREDNISolone (MEDROL DOSEPAK) 4 MG TBPK tablet; Take as directed by package insert.  Peak flows 1 yellow, 2 green.  Pt does not want to do oral prednisone which is what I feel like he needs. Hx of prednisone causing severe mood changes. Medrol dose only if not improving.  Solumedrol 125mg  and depo medrol 80mg  IM today.  Reassured pt that seems to be improving.  4 times a day lung rehab with peak flow meter.  2 times a day albuterol inhaler.  flinish levaquin.

## 2016-11-23 ENCOUNTER — Other Ambulatory Visit: Payer: Self-pay | Admitting: Physician Assistant

## 2016-11-23 ENCOUNTER — Ambulatory Visit (HOSPITAL_COMMUNITY)
Admission: RE | Admit: 2016-11-23 | Discharge: 2016-11-23 | Disposition: A | Discharge status: Home / Self Care | Payer: Medicare HMO | Source: Ambulatory Visit | Attending: Physician Assistant | Admitting: Physician Assistant

## 2016-11-23 ENCOUNTER — Telehealth: Payer: Self-pay | Admitting: Physician Assistant

## 2016-11-23 ENCOUNTER — Encounter (HOSPITAL_COMMUNITY)
Admission: RE | Admit: 2016-11-23 | Discharge: 2016-11-23 | Disposition: A | Discharge status: Home / Self Care | Payer: Medicare HMO | Source: Ambulatory Visit | Attending: Physician Assistant | Admitting: Physician Assistant

## 2016-11-23 DIAGNOSIS — R0600 Dyspnea, unspecified: Secondary | ICD-10-CM | POA: Diagnosis not present

## 2016-11-23 DIAGNOSIS — R0602 Shortness of breath: Secondary | ICD-10-CM | POA: Insufficient documentation

## 2016-11-23 DIAGNOSIS — Z87891 Personal history of nicotine dependence: Secondary | ICD-10-CM

## 2016-11-23 DIAGNOSIS — I10 Essential (primary) hypertension: Secondary | ICD-10-CM | POA: Diagnosis not present

## 2016-11-23 LAB — COMPLETE METABOLIC PANEL WITH GFR
ALK PHOS: 59 U/L (ref 40–115)
ALT: 26 U/L (ref 9–46)
AST: 16 U/L (ref 10–35)
Albumin: 4.4 g/dL (ref 3.6–5.1)
BUN: 24 mg/dL (ref 7–25)
CO2: 30 mmol/L (ref 20–31)
CREATININE: 1.17 mg/dL (ref 0.70–1.33)
Calcium: 9.5 mg/dL (ref 8.6–10.3)
Chloride: 102 mmol/L (ref 98–110)
GFR, Est African American: 81 mL/min (ref >=60)
GFR, Est Non African American: 70 mL/min (ref >=60)
GLUCOSE: 87 mg/dL (ref 65–99)
Potassium: 4 mmol/L (ref 3.5–5.3)
SODIUM: 138 mmol/L (ref 135–146)
Total Bilirubin: 0.4 mg/dL (ref 0.2–1.2)
Total Protein: 6.8 g/dL (ref 6.1–8.1)

## 2016-11-23 LAB — D-DIMER, QUANTITATIVE (NOT AT ARMC): D DIMER QUANT: 0.5 ug{FEU}/mL — AB (ref <0.50)

## 2016-11-23 MED ORDER — TECHNETIUM TC 99M DIETHYLENETRIAME-PENTAACETIC ACID
33.0000 | Freq: Once | INTRAVENOUS | Status: AC | PRN
  Administered 2016-11-23: 33 via RESPIRATORY_TRACT

## 2016-11-23 MED ORDER — TECHNETIUM TO 99M ALBUMIN AGGREGATED
4.0000 | Freq: Once | INTRAVENOUS | Status: AC
  Administered 2016-11-23: 4.2 via INTRAVENOUS

## 2016-11-23 NOTE — Telephone Encounter (Signed)
Labs ordered. Imaging authorized. Imaging notified.

## 2016-11-23 NOTE — Progress Notes (Signed)
Pt was called and results discussed.

## 2016-11-23 NOTE — Addendum Note (Signed)
Addended by: Huel Cote on: 11/23/2016 11:40 AM   Modules accepted: Orders

## 2016-11-23 NOTE — Telephone Encounter (Signed)
Done

## 2016-11-23 NOTE — Telephone Encounter (Signed)
Pts wife called and states that she has been talking to [facility] and she would like to go ahead and order the CT for her husband to have the CT scan

## 2016-11-23 NOTE — Addendum Note (Signed)
Addended by: Huel Cote on: 11/23/2016 11:54 AM   Modules accepted: Orders

## 2016-11-23 NOTE — Addendum Note (Signed)
Addended by: Huel Cote on: 11/23/2016 11:31 AM   Modules accepted: Orders

## 2016-11-23 NOTE — Telephone Encounter (Signed)
CTA ok for downstairs to rule out PE due to SOB for 3 weeks/hx of smoking. Please do STAT today.

## 2016-11-24 ENCOUNTER — Telehealth: Payer: Self-pay | Admitting: Physician Assistant

## 2016-11-24 MED ORDER — BUDESONIDE-FORMOTEROL FUMARATE 160-4.5 MCG/ACT IN AERO: 2.0000 | INHALATION_SPRAY | Freq: Two times a day (BID) | RESPIRATORY_TRACT | 3 refills | Status: DC

## 2016-11-24 NOTE — Telephone Encounter (Signed)
Pt calls and still SOB. He cannot take steroids. He tried again and just feels too angry. Will send symbicort to use for next month. 2 puffs bid.

## 2016-11-25 NOTE — Telephone Encounter (Signed)
Symbicort sample left in urgent care for pt to pick up.

## 2016-11-27 ENCOUNTER — Other Ambulatory Visit: Payer: Self-pay | Admitting: Physician Assistant

## 2016-11-30 ENCOUNTER — Other Ambulatory Visit: Payer: Self-pay | Admitting: Physician Assistant

## 2016-12-01 ENCOUNTER — Other Ambulatory Visit: Payer: Self-pay | Admitting: Physician Assistant

## 2016-12-03 ENCOUNTER — Telehealth: Payer: Self-pay | Admitting: Physician Assistant

## 2016-12-03 MED ORDER — DICYCLOMINE HCL 20 MG PO TABS: ORAL_TABLET | ORAL | 5 refills | Status: DC

## 2016-12-03 NOTE — Telephone Encounter (Signed)
Pt calls in for dicyclomine refill. Sent to pharmacy.

## 2017-01-04 DIAGNOSIS — R69 Illness, unspecified: Secondary | ICD-10-CM | POA: Diagnosis not present

## 2017-01-28 ENCOUNTER — Other Ambulatory Visit: Payer: Self-pay | Admitting: Physician Assistant

## 2017-02-12 ENCOUNTER — Other Ambulatory Visit: Payer: Self-pay | Admitting: Physician Assistant

## 2017-03-01 ENCOUNTER — Other Ambulatory Visit: Payer: Self-pay | Admitting: Physician Assistant

## 2017-03-18 DIAGNOSIS — R69 Illness, unspecified: Secondary | ICD-10-CM | POA: Diagnosis not present

## 2017-04-23 ENCOUNTER — Telehealth: Payer: Self-pay

## 2017-04-23 MED ORDER — COLESTIPOL HCL 1 G PO TABS: 1.0000 g | ORAL_TABLET | Freq: Two times a day (BID) | ORAL | 0 refills | Status: DC

## 2017-04-23 NOTE — Telephone Encounter (Signed)
Jake [name] wants a refill on colestipol. Historical provider.

## 2017-04-23 NOTE — Telephone Encounter (Signed)
Call pt: he does need a physical/med refill appt. Gave one month.

## 2017-04-26 NOTE — Telephone Encounter (Signed)
Pt advised.

## 2017-05-17 ENCOUNTER — Other Ambulatory Visit: Payer: Self-pay | Admitting: Physician Assistant

## 2017-05-26 ENCOUNTER — Encounter: Payer: Self-pay | Admitting: Physician Assistant

## 2017-05-26 ENCOUNTER — Ambulatory Visit (HOSPITAL_BASED_OUTPATIENT_CLINIC_OR_DEPARTMENT_OTHER)
Admission: RE | Admit: 2017-05-26 | Discharge: 2017-05-26 | Disposition: A | Discharge status: Home / Self Care | Payer: Medicare HMO | Source: Ambulatory Visit | Attending: Physician Assistant | Admitting: Physician Assistant

## 2017-05-26 ENCOUNTER — Other Ambulatory Visit: Payer: Self-pay | Admitting: Medical

## 2017-05-26 ENCOUNTER — Ambulatory Visit (INDEPENDENT_AMBULATORY_CARE_PROVIDER_SITE_OTHER): Payer: Medicare HMO | Admitting: Physician Assistant

## 2017-05-26 VITALS — BP 157/72 | HR 101 | Temp 97.5°F | Wt 244.0 lb

## 2017-05-26 DIAGNOSIS — K760 Fatty (change of) liver, not elsewhere classified: Secondary | ICD-10-CM | POA: Diagnosis not present

## 2017-05-26 DIAGNOSIS — R760 Raised antibody titer: Secondary | ICD-10-CM | POA: Diagnosis not present

## 2017-05-26 DIAGNOSIS — K5792 Diverticulitis of intestine, part unspecified, without perforation or abscess without bleeding: Secondary | ICD-10-CM | POA: Diagnosis not present

## 2017-05-26 DIAGNOSIS — R938 Abnormal findings on diagnostic imaging of other specified body structures: Secondary | ICD-10-CM | POA: Insufficient documentation

## 2017-05-26 DIAGNOSIS — R198 Other specified symptoms and signs involving the digestive system and abdomen: Secondary | ICD-10-CM | POA: Diagnosis not present

## 2017-05-26 DIAGNOSIS — R110 Nausea: Secondary | ICD-10-CM | POA: Diagnosis not present

## 2017-05-26 DIAGNOSIS — Z91041 Radiographic dye allergy status: Secondary | ICD-10-CM

## 2017-05-26 DIAGNOSIS — K5732 Diverticulitis of large intestine without perforation or abscess without bleeding: Secondary | ICD-10-CM | POA: Diagnosis not present

## 2017-05-26 DIAGNOSIS — M549 Dorsalgia, unspecified: Secondary | ICD-10-CM

## 2017-05-26 DIAGNOSIS — K56699 Other intestinal obstruction unspecified as to partial versus complete obstruction: Secondary | ICD-10-CM | POA: Diagnosis not present

## 2017-05-26 DIAGNOSIS — R1084 Generalized abdominal pain: Secondary | ICD-10-CM | POA: Diagnosis not present

## 2017-05-26 DIAGNOSIS — I2510 Atherosclerotic heart disease of native coronary artery without angina pectoris: Secondary | ICD-10-CM | POA: Insufficient documentation

## 2017-05-26 DIAGNOSIS — Z8719 Personal history of other diseases of the digestive system: Secondary | ICD-10-CM | POA: Diagnosis not present

## 2017-05-26 LAB — COMPLETE METABOLIC PANEL WITH GFR
AG RATIO: 1.9 (calc) (ref 1.0–2.5)
ALKALINE PHOSPHATASE (APISO): 67 U/L (ref 40–115)
ALT: 46 U/L (ref 9–46)
AST: 27 U/L (ref 10–35)
Albumin: 4.8 g/dL (ref 3.6–5.1)
BILIRUBIN TOTAL: 0.6 mg/dL (ref 0.2–1.2)
BUN: 22 mg/dL (ref 7–25)
CHLORIDE: 100 mmol/L (ref 98–110)
CO2: 28 mmol/L (ref 20–32)
Calcium: 9.9 mg/dL (ref 8.6–10.3)
Creat: 1.15 mg/dL (ref 0.70–1.33)
GFR, Est African American: 82 mL/min/{1.73_m2} (ref >=60)
GFR, Est Non African American: 71 mL/min/{1.73_m2} (ref >=60)
GLOBULIN: 2.5 g/dL (ref 1.9–3.7)
Glucose, Bld: 87 mg/dL (ref 65–99)
POTASSIUM: 4 mmol/L (ref 3.5–5.3)
SODIUM: 136 mmol/L (ref 135–146)
Total Protein: 7.3 g/dL (ref 6.1–8.1)

## 2017-05-26 LAB — CBC WITH DIFFERENTIAL/PLATELET
BASOS PCT: 0.3 %
Basophils Absolute: 24 cells/uL (ref 0–200)
EOS ABS: 128 {cells}/uL (ref 15–500)
EOS PCT: 1.6 %
HCT: 45.8 % (ref 38.5–50.0)
Hemoglobin: 15.7 g/dL (ref 13.2–17.1)
Lymphs Abs: 1896 cells/uL (ref 850–3900)
MCH: 28 pg (ref 27.0–33.0)
MCHC: 34.3 g/dL (ref 32.0–36.0)
MCV: 81.6 fL (ref 80.0–100.0)
MONOS PCT: 10.2 %
MPV: 8.7 fL (ref 7.5–12.5)
NEUTROS ABS: 5136 {cells}/uL (ref 1500–7800)
Neutrophils Relative %: 64.2 %
PLATELETS: 253 10*3/uL (ref 140–400)
RBC: 5.61 10*6/uL (ref 4.20–5.80)
RDW: 15.6 % — ABNORMAL HIGH (ref 11.0–15.0)
TOTAL LYMPHOCYTE: 23.7 %
WBC mixed population: 816 cells/uL (ref 200–950)
WBC: 8 10*3/uL (ref 3.8–10.8)

## 2017-05-26 LAB — POCT URINALYSIS DIPSTICK
BILIRUBIN UA: NEGATIVE
GLUCOSE UA: NEGATIVE
Ketones, UA: NEGATIVE
Leukocytes, UA: NEGATIVE
Nitrite, UA: NEGATIVE
PH UA: 6 (ref 5.0–8.0)
Protein, UA: NEGATIVE
RBC UA: NEGATIVE
SPEC GRAV UA: 1.02 (ref 1.010–1.025)
Urobilinogen, UA: 0.2 E.U./dL

## 2017-05-26 LAB — LIPASE: LIPASE: 24 U/L (ref 7–60)

## 2017-05-26 MED ORDER — CIPROFLOXACIN HCL 500 MG PO TABS: 500.0000 mg | ORAL_TABLET | Freq: Two times a day (BID) | ORAL | 0 refills | Status: DC

## 2017-05-26 MED ORDER — METRONIDAZOLE 500 MG PO TABS: 500.0000 mg | ORAL_TABLET | Freq: Three times a day (TID) | ORAL | 0 refills | Status: DC

## 2017-05-26 NOTE — Progress Notes (Signed)
Subjective:    Patient ID: Jake [name], male    DOB: 09/12/1960, 56 y.o.   MRN: [id number]  HPI  Pt is a 56 yo male with past medical hx of colon resection and diverticulitis and pancreatitis who presents with loose stools, abdominal pain, nausea for 3 days. Symptoms started with nausea without vomiting. He has now stopped going to the bathroom but still has urge 5-10 times a day. No fever, chills. He is taking zofran. No one else is sick. He has not eaten anything out of ordinary. No travel. He has IBS symptoms and takes bentyl twice a day.   .. Active Ambulatory Problems    Diagnosis Date Noted  . Low back pain 05/05/2016  . Muscle spasm of back 05/05/2016  . Hyperlipidemia 05/19/2016  . Hypertriglyceridemia 05/19/2016  . Diverticulosis of colon without hemorrhage 05/19/2016  . IBS (irritable bowel syndrome) 05/19/2016  . Essential hypertension, benign 05/19/2016  . GERD (gastroesophageal reflux disease) 05/19/2016  . Allergic rhinitis 05/19/2016  . OSA on CPAP 05/19/2016  . Generalized anxiety disorder 05/19/2016  . Mood changes ([place]) 05/19/2016  . Atelectasis of both lungs 11/20/2016  . Community acquired pneumonia of left lower lobe of lung ([place]) 11/20/2016  . 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 History of pancreatitis 05/27/2017  . Diverticulitis 05/27/2017   Resolved Ambulatory Problems    Diagnosis Date Noted  . No Resolved Ambulatory Problems   Past Medical History:  Diagnosis Date  . Bipolar disorder ([place])   . GERD (gastroesophageal reflux disease)   . Hyperlipidemia   . Hypertension         Review of Systems See HPI>     Objective:   Physical Exam  Constitutional: He is oriented to person, place, and time. He appears well-developed.  HENT:  Head: Normocephalic and atraumatic.  Cardiovascular: Regular rhythm and normal heart sounds.   Tachycardia   Pulmonary/Chest: Effort normal and breath sounds normal.  Left CVA tenderness.   Abdominal: He  exhibits distension. He exhibits no mass. There is tenderness. There is guarding. There is no rebound.  Generalized abdominal tenderness with guarding and distention.  Bowel sounds decreased in all quadrants.   Neurological: He is alert and oriented to person, place, and time.  Skin: He is diaphoretic.  Psychiatric: He has a normal mood and affect. His behavior is normal.          Assessment & Plan:  [name] KitchenMarland KitchenAbhijot was seen today for diarrhea.  Diagnoses and all orders for this visit:  Diverticulitis -     ciprofloxacin (CIPRO) 500 MG tablet; Take 1 tablet (500 mg total) by mouth 2 (two) times daily. For 10 days. -     metroNIDAZOLE (FLAGYL) 500 MG tablet; Take 1 tablet (500 mg total) by mouth 3 (three) times daily. For 10 days. -     Ambulatory referral to Gastroenterology  Nausea -     CT ABDOMEN PELVIS WO CONTRAST; Future -     CBC with Differential/Platelet -     Lipase -     COMPLETE METABOLIC PANEL WITH GFR  Abdominal guarding -     CT ABDOMEN PELVIS WO CONTRAST; Future -     CBC with Differential/Platelet -     Lipase -     COMPLETE METABOLIC PANEL WITH GFR  CVA tenderness -     CBC with Differential/Platelet -     Lipase -     COMPLETE METABOLIC PANEL WITH GFR -  POCT urinalysis dipstick  Allergic to IV contrast  Generalized abdominal pain -     CT ABDOMEN PELVIS WO CONTRAST; Future -     CT ABDOMEN PELVIS WO CONTRAST -     CBC with Differential/Platelet -     Lipase -     COMPLETE METABOLIC PANEL WITH GFR  History of pancreatitis -     Lipase  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 Showed diverticulitis with possible stricture.  Treated with cipro and flagyl.  Referral made to GI.  BRAT diet.  Reassurance labs are negative for pancreatitis and look quite good.  UA dipstick negative  for leuks, blood, nitrates.

## 2017-05-27 DIAGNOSIS — Z8719 Personal history of other diseases of the digestive system: Secondary | ICD-10-CM | POA: Insufficient documentation

## 2017-05-27 DIAGNOSIS — K56699 Other intestinal obstruction unspecified as to partial versus complete obstruction: Secondary | ICD-10-CM | POA: Insufficient documentation

## 2017-05-27 DIAGNOSIS — K5792 Diverticulitis of intestine, part unspecified, without perforation or abscess without bleeding: Secondary | ICD-10-CM | POA: Insufficient documentation

## 2017-05-31 ENCOUNTER — Other Ambulatory Visit: Payer: Self-pay | Admitting: Physician Assistant

## 2017-06-02 ENCOUNTER — Encounter: Payer: Self-pay | Admitting: Internal Medicine

## 2017-06-07 DIAGNOSIS — K5730 Diverticulosis of large intestine without perforation or abscess without bleeding: Secondary | ICD-10-CM | POA: Diagnosis not present

## 2017-06-07 DIAGNOSIS — K219 Gastro-esophageal reflux disease without esophagitis: Secondary | ICD-10-CM | POA: Diagnosis not present

## 2017-06-07 DIAGNOSIS — Z9049 Acquired absence of other specified parts of digestive tract: Secondary | ICD-10-CM | POA: Diagnosis not present

## 2017-06-08 ENCOUNTER — Encounter: Payer: Medicare HMO | Admitting: Physician Assistant

## 2017-06-09 ENCOUNTER — Encounter: Payer: Self-pay | Admitting: Physician Assistant

## 2017-06-09 ENCOUNTER — Ambulatory Visit (INDEPENDENT_AMBULATORY_CARE_PROVIDER_SITE_OTHER): Payer: Medicare HMO | Admitting: Physician Assistant

## 2017-06-09 VITALS — BP 112/71 | HR 96 | Ht 71.0 in | Wt 237.0 lb

## 2017-06-09 DIAGNOSIS — Z0000 Encounter for general adult medical examination without abnormal findings: Secondary | ICD-10-CM

## 2017-06-09 DIAGNOSIS — E781 Pure hyperglyceridemia: Secondary | ICD-10-CM | POA: Diagnosis not present

## 2017-06-09 DIAGNOSIS — R7301 Impaired fasting glucose: Secondary | ICD-10-CM

## 2017-06-09 DIAGNOSIS — R7989 Other specified abnormal findings of blood chemistry: Secondary | ICD-10-CM | POA: Diagnosis not present

## 2017-06-09 DIAGNOSIS — R5383 Other fatigue: Secondary | ICD-10-CM

## 2017-06-09 DIAGNOSIS — R4586 Emotional lability: Secondary | ICD-10-CM

## 2017-06-09 DIAGNOSIS — N400 Enlarged prostate without lower urinary tract symptoms: Secondary | ICD-10-CM

## 2017-06-09 DIAGNOSIS — Z1329 Encounter for screening for other suspected endocrine disorder: Secondary | ICD-10-CM | POA: Diagnosis not present

## 2017-06-09 DIAGNOSIS — E559 Vitamin D deficiency, unspecified: Secondary | ICD-10-CM | POA: Diagnosis not present

## 2017-06-09 DIAGNOSIS — Z131 Encounter for screening for diabetes mellitus: Secondary | ICD-10-CM

## 2017-06-09 DIAGNOSIS — R69 Illness, unspecified: Secondary | ICD-10-CM | POA: Diagnosis not present

## 2017-06-09 DIAGNOSIS — Z13220 Encounter for screening for lipoid disorders: Secondary | ICD-10-CM | POA: Diagnosis not present

## 2017-06-09 DIAGNOSIS — E782 Mixed hyperlipidemia: Secondary | ICD-10-CM | POA: Diagnosis not present

## 2017-06-09 NOTE — Patient Instructions (Signed)
Benign Prostatic Hyperplasia Treatment Decision Aid Introduction If you have benign prostatic hyperplasia (BPH), you may have several treatment options. This document will review three of those options.  What are my treatment options? ? Medicine ? Active surveillance or "watchful waiting" is a way of monitoring the prostate by letting time pass to see if treatment is needed.: Medicines can help to stop prostate gland growth, shrink the prostate gland, and provide relief of symptoms. ? This may involve having regular checkups to measure the prostate size and keep track of symptoms.: ? : ? Surgery ? Active surveillance or "watchful waiting" is a way of monitoring the prostate by letting time pass to see if treatment is needed.: Surgery or procedures can reduce the size of the prostate or widen the urethra. ? This may involve having regular checkups to measure the prostate size and keep track of symptoms.: Surgery is often done if symptoms are severe or if there are serious complications from an enlarged prostate. ? : Surgery and procedure options may vary, and some may be less invasive than others.  Who is eligible?  Good candidates may include: ? Medicine ? Men with mild to moderate symptoms that are not affecting their quality of life.: Men with mild to moderate symptoms that are affecting their quality of life. ? : Men who are not able to tolerate a procedure or surgery. Medicines may be a good choice for older men. ? : ? Surgery ? Men with mild to moderate symptoms that are not affecting their quality of life.: Men with moderate to severe symptoms. ? : Men who have tried medicines that did not relieve symptoms or were not tolerated. ? : Men with a blocked urethra or kidney problems. What are the benefits?  Benefits may include: ? Medicine ? Not needing to involve treatment, procedures, or recovery time.: Quick relief of symptoms with some medicines. ? Being able to monitor at home to  see if symptoms improve.: Decreased need for a surgery or procedure. ? Surgery ? Not needing to involve treatment, procedures, or recovery time.: No chance for prostate tissue growth because it was removed. ? Being able to monitor at home to see if symptoms improve.: Ability to choose from a variety of options for procedures, including minimally invasive procedures. What are the risks?  Risks may include: ? Medicine ? Difficulty treating symptoms later.: Side effects from medicines that are needed to continuously treat symptoms, including sexual side effects. ? : Possibility of needing a combination of medicines in some cases. ? : ? : ? : ? Surgery ? Difficulty treating symptoms later.: Possibility of surgery complications, such as infection, or reaction to anesthetic medicines. ? : Possibility of pain and discomfort following the surgery or procedure. How long this lasts depends on the procedure. ? : Possibility of needing additional surgery or procedures in the future. ? : Possibility of needing a urinary catheter for a period of time after the surgery or procedure. ? : Side effects, including sexual side effects. What preparation is needed?  Preparation may include: ? Medicine ? None: Tests to determine which medicine will work best. ? : Trying various medicines before finding the one that works best. ? : ? Surgery ? None: Physical evaluation, lab work, and imaging studies (such as X-rays and scans). ? : Tests to measure prostate size and tests for other purposes. (This depends on the surgery or procedure.) ? : Arranging for help at home during the recovery period. The exact tests  and frequency of office visits will be decided by your health care provider based on your individual condition. What are the restrictions after?  Restrictions may include: ? Medicine ? None: Not receiving certain medicines if cataract surgery is needed in the future. ? Surgery ? None: Limited  physical and sexual function during the recovery period. What can I expect from recovery?  You may experience: ? Medicine ? Regular checkups to assess symptoms and measure prostate size.: Regular checkups to assess symptoms and measure prostate size. ? : Side effects from the medicines, such as dizziness and retrograde ejaculation. ? : ? : ? Surgery ? Regular checkups to assess symptoms and measure prostate size.: Pain from incisions or the procedure. ? : Unexpected complications. ? : Additional procedures and tests. ? : Regular checkups to assess symptoms and measure prostate size. What is the impact to quality of life?  Impact to quality of life may include: ? Medicine ? Worsening symptoms if the condition progresses.: Improved symptoms. ? : Needing to take medicine continuously to treat symptoms. ? : Side effects from the medicines. ? : Losing the ability to ejaculate. ? Surgery ? Worsening symptoms if the condition progresses.: Improved symptoms. ? : Limited physical activity during recovery. ? : Making time in your regular schedule for follow-up visits after surgery or procedures. ? : Unexpected side effects, including sexual side effects or complications. The frequency of office visits, therapy, and treatments will be decided by your health care provider and care team based on your individual condition. What is the cost?  Costs may include: ? Medicine - $$ ? Office visits with provider.: Medicine (or combination of medicines). ? Exams.: Office visits with provider. ? Lab tests.: Exams. ? : Lab tests. ? : ? Surgery - $$$ ? Office visits with provider.: Procedures. Keeping you healthy  Get these tests  Blood pressure- Have your blood pressure checked once a year by your healthcare provider.  Normal blood pressure is 120/80  Weight- Have your body mass index (BMI) calculated to screen for obesity.  BMI is a measure of body fat based on height and weight. You can also  calculate your own BMI at [email or web address].  Cholesterol- Have your cholesterol checked every year.  Diabetes- Have your blood sugar checked regularly if you have high blood pressure, high cholesterol, have a family history of diabetes or if you are overweight.  Screening for Colon Cancer- Colonoscopy starting at age 89.  Screening may begin sooner depending on your family history and other health conditions. Follow up colonoscopy as directed by your Gastroenterologist.  Screening for Prostate Cancer- Both blood work (PSA) and a rectal exam help screen for Prostate Cancer.  Screening begins at age 82 with African-American men and at age 40 with Caucasian men.  Screening may begin sooner depending on your family history.  Take these medicines  Aspirin- One aspirin daily can help prevent Heart disease and Stroke.  Flu shot- Every fall.  Tetanus- Every 10 years.  Zostavax- Once after the age of 89 to prevent Shingles.  Pneumonia shot- Once after the age of 75; if you are younger than 67, ask your healthcare provider if you need a Pneumonia shot.  Take these steps  Don't smoke- If you do smoke, talk to your doctor about quitting.  For tips on how to quit, go to www.smokefree.gov or call 1-800-QUIT-NOW.  Be physically active- Exercise 5 days a week for at least 30 minutes.  If you are not already physically active  start slow and gradually work up to 30 minutes of moderate physical activity.  Examples of moderate activity include walking briskly, mowing the yard, dancing, swimming, bicycling, etc.  Eat a healthy diet- Eat a variety of healthy food such as fruits, vegetables, low fat milk, low fat cheese, yogurt, lean meant, poultry, fish, beans, tofu, etc. For more information go to www.thenutritionsource.org  Drink alcohol in moderation- Limit alcohol intake to less than two drinks a day. Never drink and drive.  Dentist- Brush and floss twice daily; visit your dentist twice a  year.  Depression- Your emotional health is as important as your physical health. If you're feeling down, or losing interest in things you would normally enjoy please talk to your healthcare provider.  Eye exam- Visit your eye doctor every year.  Safe sex- If you may be exposed to a sexually transmitted infection, use a condom.  Seat belts- Seat belts can save your life; always wear one.  Smoke/Carbon Monoxide detectors- These detectors need to be installed on the appropriate level of your home.  Replace batteries at least once a year.  Skin cancer- When out in the sun, cover up and use sunscreen 15 SPF or higher.  Violence- If anyone is threatening you, please tell your healthcare provider.  Living Will/ Health care power of attorney- Speak with your healthcare provider and family. Benign Prostatic Hypertrophy The prostate gland is part of the reproductive system of men. A normal prostate is about the size and shape of a walnut. The prostate gland produces a fluid that is mixed with sperm to make semen. This gland surrounds the urethra and is located in front of the rectum and just below the bladder. The bladder is where urine is stored. The urethra is the tube through which urine passes from the bladder to get out of the body. The prostate grows as a man ages. An enlarged prostate not caused by cancer is called benign prostatic hypertrophy (BPH). An enlarged prostate can press on the urethra. This can make it harder to pass urine. In the early stages of enlargement, the bladder can get by with a narrowed urethra by forcing the urine through. If the problem gets worse, medical or surgical treatment may be required. This condition should be followed by your health care provider. The accumulation of urine in the bladder can cause infection. Back pressure and infection can progress to bladder damage and kidney (renal) failure. If needed, your health care provider may refer you to a specialist in  kidney and prostate disease (urologist). What are the causes? BPH is a common health problem in men older than 50 years. This condition is a normal part of aging. However, not all men will develop problems from this condition. If the enlargement grows away from the urethra, then there will not be any compression of the urethra and resistance to urine flow.If the growth is toward the urethra and compresses it, you will experience difficulty urinating. What are the signs or symptoms? Not able to completely empty your bladder. Getting up often during the night to urinate. Need to urinate frequently during the day. Difficultly starting urine flow. Decrease in size and strength of your urine stream. Dribbling after urination. Pain on urination (more common with infection). Inability to pass urine. This needs immediate treatment. The development of a urinary tract infection. How is this diagnosed? These tests will help your health care provider understand your problem: A thorough history and physical examination. A urination history, with the number  of times you urinate, the amounts of urine, the strength of the urine stream, and the feeling of emptiness or fullness after urinating. A postvoid bladder scan that measures any amount of urine that may remain in your bladder after you finish urinating. Digital rectal exam. In a rectal exam, your health care provider checks your prostate by putting a gloved, lubricated finger into your rectum to feel the back of your prostate gland. This exam detects the size of your gland and abnormal lumps or growths. Exam of your urine (urinalysis). Prostate specific antigen (PSA) screening. This is a blood test used to screen for prostate cancer. Rectal ultrasonography. This test uses sound waves to electronically produce a picture of your prostate gland.  How is this treated? Once symptoms begin, your health care provider will monitor your condition. Of the men  with this condition, one third will have symptoms that stabilize, one third will have symptoms that improve, and one third will have symptoms that progress in the first year. Mild symptoms may not need treatment. Simple observation and yearly exams may be all that is required. Medicines and surgery are options for more severe problems. Your health care provider can help you make an informed decision for what is best. Two classes of medicines are available for relief of prostate symptoms: Medicines that shrink the prostate. This helps relieve symptoms. These medicines take time to work, and it may be months before any improvement is seen. Uncommon side effects include problems with sexual function. Medicines to relax the muscle of the prostate. This also relieves the obstruction by reducing any compression on the urethra.This group of medicines work much faster than those that reduce the size of the prostate gland. Usually, one can experience improvement in days to weeks.. Side effects can include dizziness, fatigue, lightheadedness, and retrograde ejaculation (diminished volume of ejaculate).  Several types of surgical treatments are available for relief of prostate symptoms: Transurethral resection of the prostate (TURP)-In this treatment, an instrument is inserted through opening at the tip of the penis. It is used to cut away pieces of the inner core of the prostate. The pieces are removed through the same opening of the penis. This removes the obstruction and helps get rid of the symptoms. Transurethral incision (TUIP)-In this procedure, small cuts are made in the prostate. This lessens the prostates pressure on the urethra. Transurethral microwave thermotherapy (TUMT)-This procedure uses microwaves to create heat. The heat destroys and removes a small amount of prostate tissue. Transurethral needle ablation (TUNA)-This is a procedure that uses radio frequencies to do the same as TUMT. Interstitial  laser coagulation (ILC)-This is a procedure that uses a laser to do the same as TUMT and TUNA. Transurethral electrovaporization (TUVP)-This is a procedure that uses electrodes to do the same as the procedures listed above.  Contact a health care provider if: You develop a fever. There is unexplained back pain. Symptoms are not helped by medicines prescribed. You develop side effects from the medicine you are taking. Your urine becomes very dark or has a bad smell. Your lower abdomen becomes distended and you have difficulty passing your urine. Get help right away if: You are suddenly unable to urinate. This is an emergency. You should be seen immediately. There are large amounts of blood or clots in the urine. Your urinary problems become unmanageable. You develop lightheadedness, severe dizziness, or you feel faint. You develop moderate to severe low back or flank pain. You develop chills or fever. This information is  not intended to replace advice given to you by your health care provider. Make sure you discuss any questions you have with your health care provider. Document Released: 08/24/2005 Document Revised: 02/05/2016 Document Reviewed: 03/09/2013 Elsevier Interactive Patient Education  2017 [employer]. ? Exams.: Hospital. ? Lab tests.: Follow-up visits with provider. ? : Exams. ? : Lab tests. Exact costs will vary based on your location and insurance plan. Contact your insurance company to find out what is covered. Questions to ask your health care provider:  Ask: ? Medicine ? Am I eligible for this option?: Am I eligible for this option? ? How often will I need blood tests?: How often will I need blood tests? ? How often will I need an office appointment?: How often will I need an office appointment? ? What imaging tests are recommended?: What imaging tests are recommended? ? How often will I need imaging tests?: How often will I need imaging tests? ? What symptoms  should I watch for that indicate my condition is changing?: What symptoms should I watch for that indicate my condition is changing? ? What are my personal risks?: What are the side effects? ? : How likely am I to experience loss of sexual function? Will it ever return? ? : What are the costs? ? : What are my personal risks? ? : ? : ? Surgery ? Am I eligible for this option?: Am I eligible for this option? ? How often will I need blood tests?: How often will I need blood tests? ? How often will I need an office appointment?: How often will I need an office appointment? ? What imaging tests are recommended?: What imaging tests are recommended? ? How often will I need imaging tests?: How often will I need imaging tests? ? What symptoms should I watch for that indicate my condition is changing?: What symptoms should I watch for that indicate my condition is changing? ? What are my personal risks?: What are the side effects? ? : What are the potential complications? ? : How likely am I to experience loss of sexual function? Will it ever return? ? : How effective is surgery for men my age? ? : What are the costs? ? : What are my personal risks? Follow these instructions at home: Review these options and decide which one may be right for you.  My decision: ? Active Surveillance ? This is right for me:: ? This is not right for me:: ? I am unsure right now:: ? Medicine ? This is right for me:: ? This is not right for me:: ? I am unsure right now:: ? Surgery ? This is right for me:: ? This is not right for me:: ? I am unsure right now::  This information is not intended to replace advice given to you by your health care provider. Make sure you discuss any questions you have with your health care provider. Document Released: 07/16/2016 Document Revised: 07/16/2016 Document Reviewed: 07/16/2016 Elsevier Interactive Patient Education  [employer].

## 2017-06-09 NOTE — Progress Notes (Signed)
Subjective:    Patient ID: Jake [name], male    DOB: 1961/09/05, 56 y.o.   MRN: [id number]  HPI Pt is a 56 yo male who presents to the clinic for CPE.   He does complain of no energy. He has a mood disorder and sees psychiatry. He just wants to make sure "his labs are ok".   He did have recent episode of diverticulitis. He was seen by [facility] and given another round of cipro/flagyl. He will follow up in one month.   .. Active Ambulatory Problems    Diagnosis Date Noted  . Low back pain 05/05/2016  . Muscle spasm of back 05/05/2016  . Hyperlipidemia 05/19/2016  . Hypertriglyceridemia 05/19/2016  . Diverticulosis of colon without hemorrhage 05/19/2016  . IBS (irritable bowel syndrome) 05/19/2016  . Essential hypertension, benign 05/19/2016  . GERD (gastroesophageal reflux disease) 05/19/2016  . Allergic rhinitis 05/19/2016  . OSA on CPAP 05/19/2016  . Generalized anxiety disorder 05/19/2016  . Mood changes 05/19/2016  . Atelectasis of both lungs 11/20/2016  . Community acquired pneumonia of left lower lobe of lung ([place]) 11/20/2016  . 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 History of pancreatitis 05/27/2017  . Diverticulitis 05/27/2017  . Elevated fasting glucose 06/10/2017  . Low testosterone 06/10/2017  . BPH without obstruction/lower urinary tract symptoms 06/12/2017  . No energy 06/12/2017   Resolved Ambulatory Problems    Diagnosis Date Noted  . No Resolved Ambulatory Problems   Past Medical History:  Diagnosis Date  . Bipolar disorder ([place])   . GERD (gastroesophageal reflux disease)   . Hyperlipidemia   . Hypertension    .[name] Kitchen Family History  Problem Relation Age of Onset  . Cancer Mother        breast  . Depression Mother   . Hyperlipidemia Mother   . Alcohol abuse Father   . Cancer Father        skin  . Hyperlipidemia Father   . Alzheimer's disease Father   . Cancer Maternal Grandfather   . Cancer Paternal Grandmother       Review of  Systems  All other systems reviewed and are negative.      Objective:   Physical Exam  BP 112/71   Pulse 96   Ht 5\' 11"  (1.803 m)   Wt 237 lb (107.5 kg)   BMI 33.05 kg/m   General Appearance:    Alert, cooperative, no distress, appears stated age  Head:    Normocephalic, without obvious abnormality, atraumatic  Eyes:    PERRL, conjunctiva/corneas clear, EOM's intact, fundi    benign, both eyes       Ears:    Normal TM's and external ear canals, both ears  Nose:   Nares normal, septum midline, mucosa normal, no drainage    or sinus tenderness  Throat:   Lips, mucosa, and tongue normal; teeth and gums normal  Neck:   Supple, symmetrical, trachea midline, no adenopathy;       thyroid:  No enlargement/tenderness/nodules; no carotid   bruit or JVD  Back:     Symmetric, no curvature, ROM normal, no CVA tenderness  Lungs:     Clear to auscultation bilaterally, respirations unlabored  Chest wall:    No tenderness or deformity  Heart:    Regular rate and rhythm, S1 and S2 normal, no murmur, rub   or gallop  Abdomen:     Soft, non-tender, bowel sounds active all four quadrants,    no masses,  no organomegaly        Extremities:   Extremities normal, atraumatic, no cyanosis or edema  Pulses:   2+ and symmetric all extremities  Skin:   Skin color, texture, turgor normal, no rashes or lesions. Multiple skin tags around neck and back. Irritated SK's on left lower back.   Lymph nodes:   Cervical, supraclavicular, and axillary nodes normal  Neurologic:   CNII-XII intact. Normal strength, sensation and reflexes      throughout        Assessment & Plan:   [name] KitchenMarland KitchenLamark was seen today for annual exam.  Diagnoses and all orders for this visit:  Encounter for Medicare annual wellness exam -     Lipid Panel w/reflex Direct LDL -     COMPLETE METABOLIC PANEL WITH GFR -     TSH -     PSA, total and free -     CBC with Differential/Platelet -     Testosterone -     VITAMIN D 25 Hydroxy  (Vit-D Deficiency, Fractures) -     Vitamin B12  Screening for lipid disorders -     Lipid Panel w/reflex Direct LDL  Screening for diabetes mellitus -     COMPLETE METABOLIC PANEL WITH GFR  BPH without obstruction/lower urinary tract symptoms -     PSA, total and free  No energy -     TSH -     PSA, total and free -     CBC with Differential/Platelet -     Testosterone -     VITAMIN D 25 Hydroxy (Vit-D Deficiency, Fractures) -     Vitamin B12  Mood changes  Other orders -     Hemoglobin A1C w/out eAG -     TEST AUTHORIZATION                                                                                             .. Depression screen PHQ 2/9 06/09/2017  Decreased Interest 1  Down, Depressed, Hopeless 1  PHQ - 2 Score 2  Altered sleeping 0  Tired, decreased energy 3  Change in appetite 0  Feeling bad or failure about yourself  1  Trouble concentrating 0  Moving slowly or fidgety/restless 0  Suicidal thoughts 0  PHQ-9 Score 6  Difficult doing work/chores Somewhat difficult   AUA is 10. Discussed options of treating. Pt wishes to try saw palmetto first. PSA ordred. Pt decilined DRE.   [name] Kitchen Discussed 150 minutes of exercise a week.  Encouraged vitamin D 1000 units and Calcium 1300mg  or 4 servings of dairy a day.  Strongly encouraged some weight loss.   Pt is seeing GI and will get colonoscopy.  Pt declined flu shot.   Will send labs to pyschiatry, dr. Ellene Route.

## 2017-06-10 ENCOUNTER — Encounter: Payer: Self-pay | Admitting: Physician Assistant

## 2017-06-10 DIAGNOSIS — R7989 Other specified abnormal findings of blood chemistry: Secondary | ICD-10-CM | POA: Insufficient documentation

## 2017-06-10 DIAGNOSIS — R7301 Impaired fasting glucose: Secondary | ICD-10-CM | POA: Insufficient documentation

## 2017-06-12 DIAGNOSIS — R3915 Urgency of urination: Secondary | ICD-10-CM

## 2017-06-12 DIAGNOSIS — R5383 Other fatigue: Secondary | ICD-10-CM | POA: Insufficient documentation

## 2017-06-12 DIAGNOSIS — N401 Enlarged prostate with lower urinary tract symptoms: Secondary | ICD-10-CM | POA: Insufficient documentation

## 2017-06-12 LAB — COMPLETE METABOLIC PANEL WITH GFR
AG Ratio: 2 (calc) (ref 1.0–2.5)
ALBUMIN MSPROF: 4.7 g/dL (ref 3.6–5.1)
ALT: 43 U/L (ref 9–46)
AST: 24 U/L (ref 10–35)
Alkaline phosphatase (APISO): 61 U/L (ref 40–115)
BUN: 21 mg/dL (ref 7–25)
CALCIUM: 9.8 mg/dL (ref 8.6–10.3)
CO2: 29 mmol/L (ref 20–32)
CREATININE: 1.19 mg/dL (ref 0.70–1.33)
Chloride: 104 mmol/L (ref 98–110)
GFR, EST AFRICAN AMERICAN: 79 mL/min/{1.73_m2} (ref >=60)
GFR, Est Non African American: 68 mL/min/{1.73_m2} (ref >=60)
GLUCOSE: 105 mg/dL — AB (ref 65–99)
Globulin: 2.3 g/dL (calc) (ref 1.9–3.7)
Potassium: 4.2 mmol/L (ref 3.5–5.3)
Sodium: 140 mmol/L (ref 135–146)
TOTAL PROTEIN: 7 g/dL (ref 6.1–8.1)
Total Bilirubin: 0.5 mg/dL (ref 0.2–1.2)

## 2017-06-12 LAB — TESTOSTERONE: TESTOSTERONE: 301 ng/dL (ref 250–827)

## 2017-06-12 LAB — CBC WITH DIFFERENTIAL/PLATELET
BASOS ABS: 37 {cells}/uL (ref 0–200)
Basophils Relative: 0.6 %
EOS PCT: 2.1 %
Eosinophils Absolute: 130 cells/uL (ref 15–500)
HEMATOCRIT: 45.5 % (ref 38.5–50.0)
HEMOGLOBIN: 15.5 g/dL (ref 13.2–17.1)
LYMPHS ABS: 1308 {cells}/uL (ref 850–3900)
MCH: 27.5 pg (ref 27.0–33.0)
MCHC: 34.1 g/dL (ref 32.0–36.0)
MCV: 80.7 fL (ref 80.0–100.0)
MPV: 9.7 fL (ref 7.5–12.5)
Monocytes Relative: 11.8 %
NEUTROS ABS: 3993 {cells}/uL (ref 1500–7800)
Neutrophils Relative %: 64.4 %
Platelets: 284 10*3/uL (ref 140–400)
RBC: 5.64 10*6/uL (ref 4.20–5.80)
RDW: 14.4 % (ref 11.0–15.0)
Total Lymphocyte: 21.1 %
WBC mixed population: 732 cells/uL (ref 200–950)
WBC: 6.2 10*3/uL (ref 3.8–10.8)

## 2017-06-12 LAB — LIPID PANEL W/REFLEX DIRECT LDL
Cholesterol: 175 mg/dL (ref <200)
HDL: 38 mg/dL — ABNORMAL LOW (ref >40)
LDL Cholesterol (Calc): 103 mg/dL (calc) — ABNORMAL HIGH
Non-HDL Cholesterol (Calc): 137 mg/dL (calc) — ABNORMAL HIGH (ref <130)
Total CHOL/HDL Ratio: 4.6 (calc) (ref <5.0)
Triglycerides: 226 mg/dL — ABNORMAL HIGH (ref <150)

## 2017-06-12 LAB — PSA, TOTAL AND FREE
PSA, % FREE: 17 % — AB (ref >25)
PSA, Free: 0.2 ng/mL
PSA, Total: 1.2 ng/mL (ref <=4.0)

## 2017-06-12 LAB — TEST AUTHORIZATION

## 2017-06-12 LAB — HEMOGLOBIN A1C W/OUT EAG: Hgb A1c MFr Bld: 5.9 % of total Hgb — ABNORMAL HIGH (ref <5.7)

## 2017-06-12 LAB — TSH: TSH: 1.32 m[IU]/L (ref 0.40–4.50)

## 2017-06-12 LAB — VITAMIN D 25 HYDROXY (VIT D DEFICIENCY, FRACTURES): Vit D, 25-Hydroxy: 35 ng/mL (ref 30–100)

## 2017-06-12 LAB — VITAMIN B12: VITAMIN B 12: 479 pg/mL (ref 200–1100)

## 2017-06-12 NOTE — Progress Notes (Signed)
Please send copy of labs to schubert, psychiatrist..

## 2017-06-16 ENCOUNTER — Other Ambulatory Visit: Payer: Self-pay | Admitting: Physician Assistant

## 2017-06-16 DIAGNOSIS — R197 Diarrhea, unspecified: Secondary | ICD-10-CM | POA: Diagnosis not present

## 2017-06-16 MED ORDER — COLESTIPOL HCL 1 G PO TABS: 1.0000 g | ORAL_TABLET | Freq: Every day | ORAL | 4 refills | Status: DC

## 2017-06-21 ENCOUNTER — Other Ambulatory Visit: Payer: Self-pay | Admitting: *Deleted

## 2017-06-21 DIAGNOSIS — R69 Illness, unspecified: Secondary | ICD-10-CM | POA: Diagnosis not present

## 2017-06-21 MED ORDER — COLESTIPOL HCL 1 G PO TABS: 1.0000 g | ORAL_TABLET | Freq: Every day | ORAL | 4 refills | Status: DC

## 2017-07-12 ENCOUNTER — Ambulatory Visit: Payer: Medicare HMO | Admitting: Physician Assistant

## 2017-07-19 ENCOUNTER — Telehealth: Payer: Self-pay | Admitting: Physician Assistant

## 2017-07-19 DIAGNOSIS — R69 Illness, unspecified: Secondary | ICD-10-CM | POA: Diagnosis not present

## 2017-07-21 ENCOUNTER — Encounter: Payer: Self-pay | Admitting: Physician Assistant

## 2017-07-21 ENCOUNTER — Ambulatory Visit: Payer: Medicare HMO | Admitting: Physician Assistant

## 2017-07-21 VITALS — BP 92/73 | HR 92 | Wt 239.0 lb

## 2017-07-21 DIAGNOSIS — R05 Cough: Secondary | ICD-10-CM | POA: Diagnosis not present

## 2017-07-21 DIAGNOSIS — G478 Other sleep disorders: Secondary | ICD-10-CM | POA: Diagnosis not present

## 2017-07-21 DIAGNOSIS — G4733 Obstructive sleep apnea (adult) (pediatric): Secondary | ICD-10-CM | POA: Diagnosis not present

## 2017-07-21 DIAGNOSIS — R7989 Other specified abnormal findings of blood chemistry: Secondary | ICD-10-CM

## 2017-07-21 DIAGNOSIS — R059 Cough, unspecified: Secondary | ICD-10-CM

## 2017-07-21 DIAGNOSIS — Z23 Encounter for immunization: Secondary | ICD-10-CM | POA: Diagnosis not present

## 2017-07-21 DIAGNOSIS — Z87891 Personal history of nicotine dependence: Secondary | ICD-10-CM

## 2017-07-21 DIAGNOSIS — Z9989 Dependence on other enabling machines and devices: Secondary | ICD-10-CM | POA: Diagnosis not present

## 2017-07-21 MED ORDER — SODIUM BICARBONATE 650 MG PO TABS: 650.0000 mg | ORAL_TABLET | Freq: Four times a day (QID) | ORAL | 5 refills | Status: DC

## 2017-07-21 NOTE — Progress Notes (Signed)
flu

## 2017-07-21 NOTE — Progress Notes (Signed)
Subjective:    Patient ID: Jake [name], male    DOB: 1961-06-14, 56 y.o.   MRN: [id number]  HPI  Pt is a 56 yo male who presents to the clinic with his wife to discuss sleep apnea. He was dx with sleep apnea more than 5 years ago and still has the same machine. He has not been using lately because does not seem to be helping. He was interested in new machine. He admits to waking up feeling not well rested.   Pt does have a dry cough intermittent but ongoing. Hx of smoking 30 years 2 packs a day. Given symbicort after bad case of bronchitis but stopped using after 1 month. No wheezing. No fever, chills, sinus pressure. Rescue inhaler helps some.   Had previous low T lab. He has not had his 2nd recheck. He wonders if this could be contributing to his no energy.   His psychiatrist is also allowing him to taper down off bipolar medication. Pt believes that when he was DX of bipolar he was also in detox and not convienced he has bipolar. He is down to zyprexa 15mg  a night.   .. Active Ambulatory Problems    Diagnosis Date Noted  . Low back pain 05/05/2016  . Muscle spasm of back 05/05/2016  . Hyperlipidemia 05/19/2016  . Hypertriglyceridemia 05/19/2016  . Diverticulosis of colon without hemorrhage 05/19/2016  . IBS (irritable bowel syndrome) 05/19/2016  . Essential hypertension, benign 05/19/2016  . GERD (gastroesophageal reflux disease) 05/19/2016  . Allergic rhinitis 05/19/2016  . OSA on CPAP 05/19/2016  . Generalized anxiety disorder 05/19/2016  . Mood changes 05/19/2016  . Atelectasis of both lungs 11/20/2016  . 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 History of pancreatitis 05/27/2017  . Diverticulitis 05/27/2017  . Elevated fasting glucose 06/10/2017  . Low testosterone 06/10/2017  . BPH without obstruction/lower urinary tract symptoms 06/12/2017  . No energy 06/12/2017  . Former smoker 07/25/2017  . Cough 07/25/2017  . 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  Non-restorative sleep 07/25/2017   Resolved Ambulatory Problems    Diagnosis Date Noted  . Community acquired pneumonia of left lower lobe of lung ([place]) 11/20/2016   Past Medical History:  Diagnosis Date  . Bipolar disorder ([place])   . GERD (gastroesophageal reflux disease)   . Hyperlipidemia   . Hypertension      Review of Systems  All other systems reviewed and are negative.      Objective:   Physical Exam  Constitutional: He is oriented to person, place, and time. He appears well-developed and well-nourished.  Obese.   HENT:  Head: Normocephalic and atraumatic.  Cardiovascular: Normal rate, regular rhythm and normal heart sounds.  Pulmonary/Chest: Effort normal and breath sounds normal.  Neurological: He is alert and oriented to person, place, and time.  Psychiatric: He has a normal mood and affect. His behavior is normal.          Assessment & Plan:  [name] KitchenMarland KitchenBexley was seen today for sleep apnea.  Diagnoses and all orders for this visit:  OSA on CPAP -     Home sleep test  Low testosterone in male -     Testosterone  Influenza vaccine needed -     Flu Vaccine QUAD 6+ mos PF IM (Fluarix Quad PF)  Former smoker  Cough  Non-restorative sleep -     Home sleep test  Other orders -     sodium bicarbonate 650 MG tablet; Take 1 tablet (  650 mg total) 4 (four) times daily by mouth.   Will schedule new sleep study to look into new machines.   Concern with cough. Needs spironmetry. Come back for this.   Certainly pt has a few potential causes for no energy. Pt needs to get back on CPAP, will evaluate lungs, psychiatry working on [employer]. Perhaps medications are making him more lethargic.   Discussed testosterone supplementation and side effects. Pt aware of risk. Would like to try topical testosterone first if levels are not where they should be.

## 2017-07-22 LAB — TESTOSTERONE: Testosterone: 278 ng/dL (ref 250–827)

## 2017-07-25 DIAGNOSIS — R05 Cough: Secondary | ICD-10-CM | POA: Insufficient documentation

## 2017-07-25 DIAGNOSIS — G478 Other sleep disorders: Secondary | ICD-10-CM | POA: Insufficient documentation

## 2017-07-25 DIAGNOSIS — R059 Cough, unspecified: Secondary | ICD-10-CM | POA: Insufficient documentation

## 2017-07-25 DIAGNOSIS — R7989 Other specified abnormal findings of blood chemistry: Secondary | ICD-10-CM | POA: Insufficient documentation

## 2017-07-25 DIAGNOSIS — Z87891 Personal history of nicotine dependence: Secondary | ICD-10-CM | POA: Insufficient documentation

## 2017-07-25 MED ORDER — TESTOSTERONE 4 MG/24HR TD PT24: 1.0000 | MEDICATED_PATCH | Freq: Every day | TRANSDERMAL | 1 refills | Status: DC

## 2017-07-25 NOTE — Telephone Encounter (Signed)
Call pt: testosterone remained low. Sent patches to try. Once patch every night. Recheck in 6 weeks testosteorne level.

## 2017-07-26 NOTE — Telephone Encounter (Signed)
LMOM for pt to return call. 

## 2017-07-27 ENCOUNTER — Ambulatory Visit: Payer: Medicare HMO | Admitting: Internal Medicine

## 2017-07-27 NOTE — Telephone Encounter (Signed)
Pt called back & was notified of results & rx.

## 2017-08-03 ENCOUNTER — Telehealth: Payer: Self-pay | Admitting: *Deleted

## 2017-08-03 NOTE — Telephone Encounter (Signed)
Pre Authorization sent to cover my meds. Jake [name]

## 2017-08-04 NOTE — Telephone Encounter (Signed)
Medication has been approved. Effective dates are 09/05/2016 through 09/06/2018. Tried to call patient and vm states unavailable and to try again later. vm left with pharmacy

## 2017-08-05 ENCOUNTER — Other Ambulatory Visit: Payer: Self-pay | Admitting: Physician Assistant

## 2017-08-05 MED ORDER — TESTOSTERONE 10 MG/ACT (2%) TD GEL: 40.0000 mg | TRANSDERMAL | 2 refills | Status: DC

## 2017-08-05 NOTE — Progress Notes (Signed)
Patch was on backorder sent gel.

## 2017-08-12 ENCOUNTER — Telehealth: Payer: Self-pay | Admitting: Physician Assistant

## 2017-08-12 NOTE — Telephone Encounter (Signed)
Pt states he has still not been called for the sleep study. Please follow up on this. He request the home study not in the lab.

## 2017-08-17 ENCOUNTER — Other Ambulatory Visit: Payer: Self-pay | Admitting: Physician Assistant

## 2017-08-25 ENCOUNTER — Other Ambulatory Visit: Payer: Self-pay | Admitting: Physician Assistant

## 2017-08-25 MED ORDER — IPRATROPIUM BROMIDE 0.06 % NA SOLN: 2.0000 | Freq: Four times a day (QID) | NASAL | 1 refills | Status: DC

## 2017-08-27 ENCOUNTER — Ambulatory Visit: Payer: Medicare HMO | Admitting: Physician Assistant

## 2017-08-27 ENCOUNTER — Encounter: Payer: Self-pay | Admitting: Physician Assistant

## 2017-08-27 VITALS — BP 143/70 | HR 106 | Temp 98.3°F | Ht 71.0 in | Wt 235.0 lb

## 2017-08-27 DIAGNOSIS — J329 Chronic sinusitis, unspecified: Secondary | ICD-10-CM | POA: Diagnosis not present

## 2017-08-27 DIAGNOSIS — J0100 Acute maxillary sinusitis, unspecified: Secondary | ICD-10-CM | POA: Diagnosis not present

## 2017-08-27 MED ORDER — METHYLPREDNISOLONE SODIUM SUCC 125 MG IJ SOLR
125.0000 mg | Freq: Once | INTRAMUSCULAR | Status: AC
  Administered 2017-08-27: 125 mg via INTRAMUSCULAR

## 2017-08-27 MED ORDER — AMOXICILLIN-POT CLAVULANATE 875-125 MG PO TABS: 1.0000 | ORAL_TABLET | Freq: Two times a day (BID) | ORAL | 0 refills | Status: DC

## 2017-08-27 NOTE — Patient Instructions (Signed)

## 2017-08-28 NOTE — Progress Notes (Signed)
   Subjective:    Patient ID: Jake [name], male    DOB: Jun 18, 1961, 56 y.o.   MRN: [id number]  HPI Pt is a 56 yo male who presents to the clinic with sinus pressure, headache, nasal congestion, ST, dry cough for 2 weeks but continues to progress. His wife and daughter have both been sick. No fever, chills, body aches. He wants to make sure he is seen before christmas and the holiday. He has been trying dayquil and nyquil.   .. Active Ambulatory Problems    Diagnosis Date Noted  . Low back pain 05/05/2016  . Muscle spasm of back 05/05/2016  . Hyperlipidemia 05/19/2016  . Hypertriglyceridemia 05/19/2016  . Diverticulosis of colon without hemorrhage 05/19/2016  . IBS (irritable bowel syndrome) 05/19/2016  . Essential hypertension, benign 05/19/2016  . GERD (gastroesophageal reflux disease) 05/19/2016  . Allergic rhinitis 05/19/2016  . OSA on CPAP 05/19/2016  . Generalized anxiety disorder 05/19/2016  . Mood changes 05/19/2016  . Atelectasis of both lungs 11/20/2016  . 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 History of pancreatitis 05/27/2017  . Diverticulitis 05/27/2017  . Elevated fasting glucose 06/10/2017  . Low testosterone 06/10/2017  . BPH without obstruction/lower urinary tract symptoms 06/12/2017  . No energy 06/12/2017  . Former smoker 07/25/2017  . Cough 07/25/2017  . 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 Non-restorative sleep 07/25/2017   Resolved Ambulatory Problems    Diagnosis Date Noted  . Community acquired pneumonia of left lower lobe of lung ([place]) 11/20/2016   Past Medical History:  Diagnosis Date  . Bipolar disorder ([place])   . GERD (gastroesophageal reflux disease)   . Hyperlipidemia   . Hypertension       Review of Systems  All other systems reviewed and are negative.      Objective:   Physical Exam  Constitutional: He is oriented to person, place, and time. He appears well-developed and well-nourished.  HENT:  Head: Normocephalic and  atraumatic.  Right Ear: External ear normal.  Left Ear: External ear normal.  Mouth/Throat: Oropharynx is clear and moist.  TM's clear bilaterally.  Tenderness over maxillary sinuses.  Nasal turbinates swollen.   Eyes: Conjunctivae are normal. Right eye exhibits no discharge. Left eye exhibits no discharge.  Neck: Normal range of motion. Neck supple. No thyromegaly present.  Cardiovascular: Normal rate, regular rhythm and normal heart sounds.  Pulmonary/Chest: Effort normal and breath sounds normal. He has no wheezes.  Lymphadenopathy:    He has cervical adenopathy.  Neurological: He is alert and oriented to person, place, and time.  Psychiatric: He has a normal mood and affect. His behavior is normal.          Assessment & Plan:  [name] KitchenMarland KitchenDiagnoses and all orders for this visit:  Acute non-recurrent maxillary sinusitis -     methylPREDNISolone sodium succinate (SOLU-MEDROL) 125 mg/2 mL injection 125 mg -     amoxicillin-clavulanate (AUGMENTIN) 875-125 MG tablet; Take 1 tablet by mouth 2 (two) times daily. For 10 days.   abx and solumedrol given. HO for symptomatic care. Follow up if not improving.

## 2017-09-03 ENCOUNTER — Other Ambulatory Visit: Payer: Self-pay | Admitting: Physician Assistant

## 2017-09-03 MED ORDER — AMBULATORY NON FORMULARY MEDICATION: 0 refills | Status: DC

## 2017-09-03 NOTE — Progress Notes (Unsigned)
Call pt: let him know home sleep study received. Confirmed moderate sleep apnea. I am placing order for CPAP and supplies.

## 2017-09-03 NOTE — Progress Notes (Signed)
Pt advised. Will send off order.

## 2017-09-03 NOTE — Progress Notes (Signed)
Sent to [facility].  

## 2017-09-22 DIAGNOSIS — G4733 Obstructive sleep apnea (adult) (pediatric): Secondary | ICD-10-CM | POA: Diagnosis not present

## 2017-09-24 DIAGNOSIS — R69 Illness, unspecified: Secondary | ICD-10-CM | POA: Diagnosis not present

## 2017-10-23 DIAGNOSIS — G4733 Obstructive sleep apnea (adult) (pediatric): Secondary | ICD-10-CM | POA: Diagnosis not present

## 2017-11-11 ENCOUNTER — Other Ambulatory Visit: Payer: Self-pay | Admitting: Physician Assistant

## 2017-11-11 ENCOUNTER — Other Ambulatory Visit: Payer: Self-pay | Admitting: *Deleted

## 2017-11-20 DIAGNOSIS — G4733 Obstructive sleep apnea (adult) (pediatric): Secondary | ICD-10-CM | POA: Diagnosis not present

## 2017-11-21 ENCOUNTER — Other Ambulatory Visit: Payer: Self-pay | Admitting: Physician Assistant

## 2017-11-21 MED ORDER — OSELTAMIVIR PHOSPHATE 75 MG PO CAPS: 75.0000 mg | ORAL_CAPSULE | Freq: Every day | ORAL | 0 refills | Status: DC

## 2017-11-21 NOTE — Progress Notes (Signed)
Household exposure to flu

## 2017-12-13 ENCOUNTER — Other Ambulatory Visit: Payer: Self-pay | Admitting: Physician Assistant

## 2017-12-14 NOTE — Telephone Encounter (Signed)
Is refill appropriate  

## 2017-12-15 MED ORDER — DICYCLOMINE HCL 20 MG PO TABS: ORAL_TABLET | ORAL | 5 refills | Status: DC

## 2017-12-15 NOTE — Addendum Note (Signed)
Addended by: Narda Rutherford on: 12/15/2017 09:47 AM   Modules accepted: Orders

## 2017-12-30 ENCOUNTER — Ambulatory Visit (INDEPENDENT_AMBULATORY_CARE_PROVIDER_SITE_OTHER): Payer: Medicare HMO | Admitting: Family Medicine

## 2017-12-30 ENCOUNTER — Encounter: Payer: Self-pay | Admitting: Family Medicine

## 2017-12-30 VITALS — BP 123/80 | HR 80 | Ht 70.98 in | Wt 233.0 lb

## 2017-12-30 DIAGNOSIS — M76892 Other specified enthesopathies of left lower limb, excluding foot: Secondary | ICD-10-CM | POA: Diagnosis not present

## 2017-12-30 DIAGNOSIS — S39012A Strain of muscle, fascia and tendon of lower back, initial encounter: Secondary | ICD-10-CM | POA: Diagnosis not present

## 2017-12-30 MED ORDER — METHYLPREDNISOLONE ACETATE 80 MG/ML IJ SUSP
80.0000 mg | Freq: Once | INTRAMUSCULAR | Status: AC
  Administered 2017-12-30: 80 mg via INTRAMUSCULAR

## 2017-12-30 NOTE — Progress Notes (Signed)
Jevon Shells is a 57 y.o. male who presents to [facility] today for back pain.  Miciah notes a one-week history of left low back pain radiating to the left posterior thigh.  He denies any injury.  He notes the pain worsened in the shower.  He denies any pain radiating below the level of his knees weakness or numbness prior bladder dysfunction.  He denies any fevers or chills vomiting.  He has tried heat ice TENS unit Biofreeze and ibuprofen which helped a little.  He had a similar episode in 2017 that resolved with one episode of physical therapy with home exercise program and one intramuscular Depo-Medrol injection.  He would like to repeat this course of treatment if possible.   Past Medical History:  Diagnosis Date  . Bipolar disorder ([place])   . GERD (gastroesophageal reflux disease)   . Hyperlipidemia   . Hypertension    Past Surgical History:  Procedure Laterality Date  . ANKLE FRACTURE SURGERY Left   . APPENDECTOMY    . CERVICAL FUSION    . CHOLECYSTECTOMY    . COLON SURGERY    . HERNIA REPAIR     Social History   Tobacco Use  . Smoking status: Former Research scientist (life sciences)  . Smokeless tobacco: Never Used  Substance Use Topics  . Alcohol use: No     ROS:  As above   Medications: Current Outpatient Medications  Medication Sig Dispense Refill  . AMBULATORY NON FORMULARY MEDICATION Continuous positive airway pressure (CPAP) machine set at autotitration of H2O pressure, with all supplemental supplies as needed. 1 each 0  . colestipol (COLESTID) 1 g tablet Take 1 tablet (1 g total) by mouth daily. 90 tablet 4  . dicyclomine (BENTYL) 20 MG tablet TAKE 1 TABLET BY MOUTH THREE TIMES DAILY -  MUST  SCHEDULE  FOLLOW  UP  APPOINTMENT 90 tablet 5  . ipratropium (ATROVENT) 0.06 % nasal spray Place 2 sprays into both nostrils 4 (four) times daily. 15 mL 1  . loratadine (CLARITIN) 10 MG tablet Take 10 mg by mouth daily.    [name] Kitchen OLANZapine (ZYPREXA)  15 MG tablet Take 15 mg by mouth at bedtime.    . ondansetron (ZOFRAN) 8 MG tablet Take by mouth as needed for nausea or vomiting.    . pantoprazole (PROTONIX) 40 MG tablet TAKE 1 TABLET BY MOUTH TWICE DAILY 180 tablet 1  . sertraline (ZOLOFT) 100 MG tablet Take 100 mg daily by mouth.    . sodium bicarbonate 650 MG tablet Take 1 tablet (650 mg total) 4 (four) times daily by mouth. 120 tablet 5   No current facility-administered medications for this visit.    Allergies  Allergen Reactions  . Nitroglycerin Nausea And Vomiting  . Contrast Media [Iodinated Diagnostic Agents] Palpitations     Exam:  BP 123/80   Pulse 80   Ht 5' 10.98" (1.803 m)   Wt 233 lb (105.7 kg)   SpO2 96%   BMI 32.51 kg/m  General: Well Developed, well nourished, and in no acute distress.  Neuro/Psych: Alert and oriented x3, extra-ocular muscles intact, able to move all 4 extremities, sensation grossly intact. Skin: Warm and dry, no rashes noted.  Respiratory: Not using accessory muscles, speaking in full sentences, trachea midline.  Cardiovascular: Pulses palpable, no extremity edema. Abdomen: Does not appear distended. MSK:  L-spine: Nontender to palpation left lumbar paraspinal muscle group SI joint and upper buttocks. Lumbar motion significantly limited by pain. Lower extremity  strength is intact throughout. Reflexes are intact throughout. Negative slump test bilaterally. Normal sensation.  CT scan Abd and pelvis from 05/26/17 images reviewed showing mild or moderate degenerative changes mostly at L5-S1    Assessment and Plan: 57 y.o. male with Lumbar pain likely myofascial spasm and dysfunction.  Doubtful for neuropathy as pain does not radiate below the knee which is normal for S1.  Additionally slump test is negative. Plan 80 mg IM Depo-Medrol, physical therapy, TENS unit, heating pad.  Recheck if not better.     Orders Placed This Encounter  Procedures  . Ambulatory referral to Physical  Therapy    Referral Priority:   Routine    Referral Type:   Physical Medicine    Referral Reason:   Specialty Services Required    Requested Specialty:   Physical Therapy   Meds ordered this encounter  Medications  . methylPREDNISolone acetate (DEPO-MEDROL) injection 80 mg    Discussed warning signs or symptoms. Please see discharge instructions. Patient expresses understanding.

## 2017-12-30 NOTE — Patient Instructions (Signed)
Thank you for coming in today. We will do the depomedrol shot now.  Attend PT as needed.  Use a heating pad and tens unit.  Recheck with me if not getting better.  Come back or go to the emergency room if you notice new weakness new numbness problems walking or bowel or bladder problems.

## 2017-12-31 ENCOUNTER — Ambulatory Visit: Payer: Medicare HMO | Admitting: Family Medicine

## 2018-01-12 ENCOUNTER — Telehealth: Payer: Self-pay | Admitting: *Deleted

## 2018-01-12 ENCOUNTER — Other Ambulatory Visit: Payer: Self-pay | Admitting: Physician Assistant

## 2018-01-12 DIAGNOSIS — S39012S Strain of muscle, fascia and tendon of lower back, sequela: Secondary | ICD-10-CM

## 2018-01-12 NOTE — Telephone Encounter (Signed)
Pt left vm stating that he had talked with you about dry needling maybe.  He wants to know if you can order that for him or if you would need to see him first.  Please advise.

## 2018-01-12 NOTE — Telephone Encounter (Signed)
Made PT referral.

## 2018-01-12 NOTE — Telephone Encounter (Signed)
Pt.notified

## 2018-01-18 ENCOUNTER — Encounter: Payer: Self-pay | Admitting: Physical Therapy

## 2018-01-18 ENCOUNTER — Ambulatory Visit (INDEPENDENT_AMBULATORY_CARE_PROVIDER_SITE_OTHER): Payer: Medicare HMO | Admitting: Physical Therapy

## 2018-01-18 DIAGNOSIS — R29898 Other symptoms and signs involving the musculoskeletal system: Secondary | ICD-10-CM

## 2018-01-18 DIAGNOSIS — M545 Low back pain: Secondary | ICD-10-CM | POA: Diagnosis not present

## 2018-01-18 DIAGNOSIS — M62830 Muscle spasm of back: Secondary | ICD-10-CM | POA: Diagnosis not present

## 2018-01-18 NOTE — Therapy (Signed)
[facility] [address] [place] [place], [place], [address] Phone: [phone]   Fax:  [phone]  Physical Therapy Evaluation  Patient Details  Name: Jake [name] MRN: [id number] Date of Birth: 05-14-1961 Referring Provider: [name] PA   Encounter Date: 01/18/2018  PT End of Session - 01/18/18 1150    Visit Number  1    Number of Visits  4    Date for PT Re-Evaluation  02/15/18    PT Start Time  1150    PT Stop Time  1257    PT Time Calculation (min)  67 min    Activity Tolerance  Patient tolerated treatment well       Past Medical History:  Diagnosis Date  . Bipolar disorder ([place])   . GERD (gastroesophageal reflux disease)   . Hyperlipidemia   . Hypertension     Past Surgical History:  Procedure Laterality Date  . ANKLE FRACTURE SURGERY Left   . APPENDECTOMY    . CERVICAL FUSION    . CHOLECYSTECTOMY    . COLON SURGERY    . HERNIA REPAIR      There were no vitals filed for this visit.   Subjective Assessment - 01/18/18 1151    Subjective  Pt reports he bent over to pick something up about 3 wks ago, had a pop and the back has been hurting since.  Has been stretching - LTR, KTC, HS stretching - helps some, using ibuprofen and rest .  Has tried heat, tens and ice.  Also had an injection with minimal results.     How long can you sit comfortably?  no limitations, 2/10    How long can you walk comfortably?  10-20'    Patient Stated Goals  get rid of pain and go back to work.  WOrks in garden center at [employer]    Currently in Pain?  Yes    Pain Score  6     Pain Location  Back    Pain Orientation  Left;Right;Mid;Lower lumbar    Pain Descriptors / Indicators  Tightness    Pain Type  Acute pain    Pain Onset  1 to 4 weeks ago    Pain Frequency  Intermittent    Aggravating Factors   transitioning sit to stand, in/out of car.     Pain Relieving Factors  stretches some.          [facility] PT Assessment  - 01/18/18 0001      Assessment   Medical Diagnosis  Lumbosacral strain    Referring Provider  [name] PA    Onset Date/Surgical Date  12/28/17    Hand Dominance  Right    Next MD Visit  PRN    Prior Therapy  a couple years ago      Precautions   Precautions  None      Balance Screen   Has the patient fallen in the past 6 months  No      [place] residence    Living Arrangements  Spouse/significant other      Prior Function   Level of Independence  Needs assistance with ADLs difficulty with reaching feet    Vocation  Full time employment    Vocation Requirements  in garden center , currently out of work    Leisure  working outside in yard.       Observation/Other Assessments   Focus on Therapeutic  Outcomes (FOTO)   66% limited      Posture/Postural Control   Posture/Postural Control  Postural limitations    Postural Limitations  Rounded Shoulders;Forward head      ROM / Strength   AROM / PROM / Strength  AROM;Strength      AROM   AROM Assessment Site  Lumbar    Lumbar Flexion  mid shin, pain    Lumbar Extension  50% with pain    Lumbar - Right Rotation  WNL slight pain     Lumbar - Left Rotation  WNL slight pain.       Strength   Strength Assessment Site  Lumbar;Hip;Knee;Ankle    Right/Left Hip  -- grossly WNL    Right/Left Knee  -- WNL    Right/Left Ankle  -- WNL    Lumbar Flexion  -- TA poor    Lumbar Extension  -- multifidi poor      Flexibility   Soft Tissue Assessment /Muscle Length  yes    Hamstrings  supine SLR Lt 82, Rt 85    Piriformis  sligth tightness bilat      Palpation   Spinal mobility  hypomobile and tender through out sacrum and lumbar spine with CPA mobs UPA not assessed due to tight muscles.     Palpation comment  tight and tender in bilat lumbar paraspinals Lt > Rt and bilat gluts.       Special Tests    Special Tests  Lumbar    Lumbar Tests  Slump Test;Straight Leg Raise      Slump test    Findings  Positive    Side  Left    Comment  pain up leg and back.       Straight Leg Raise   Findings  Negative    Side   Left                Objective measurements completed on examination: See above findings.      [facility] Adult PT Treatment/Exercise - 01/18/18 0001      Self-Care   Self-Care  Other Self-Care Comments    Other Self-Care Comments   wallet out of the back pocket and self trigger point release with a ball on wall, VC for form to protect knees.       Exercises   Exercises  Lumbar      Lumbar Exercises: Stretches   Double Knee to Chest Stretch  1 rep;30 seconds    Lower Trunk Rotation  2 reps    Other Lumbar Stretch Exercise  cat/cow x 10       Modalities   Modalities  Electrical Stimulation;Moist Heat      Moist Heat Therapy   Number Minutes Moist Heat  20 Minutes    Moist Heat Location  Lumbar Spine      Electrical Stimulation   Electrical Stimulation Location  lumbar bilat    Electrical Stimulation Action  IFC    Electrical Stimulation Parameters  to tolerance    Electrical Stimulation Goals  Tone;Pain      Manual Therapy   Manual Therapy  Soft tissue mobilization    Soft tissue mobilization  STM bilat lumbar paraspinals and gluts       Trigger Point Dry Needling - 01/18/18 1219    Consent Given?  Yes    Education Handout Provided  Yes    Muscles Treated Upper Body  Longissimus    Longissimus Response  Palpable increased muscle length;Twitch  response elicited with stim, bilat lumbar paraspinals           PT Education - 01/18/18 1218    Education provided  Yes    Education Details  DN    Person(s) Educated  Patient    Methods  Explanation;Handout    Comprehension  Verbalized understanding          PT Long Term Goals - 01/18/18 1305      PT LONG TERM GOAL #1   Title  I with advanced HEP ( 02/15/18)     Time  4    Period  Weeks    Status  New      PT LONG TERM GOAL #2   Title  report =/> 75% reduction of LBP to allow  him to return to work ( 02/15/18)     Time  4    Period  Weeks    Status  New      PT LONG TERM GOAL #3   Title  demo lumbar ROM WFL and minimal to no pain ( 02/15/18)     Time  4    Period  Weeks    Status  New      PT LONG TERM GOAL #4   Title  improve FOTO =/< 49% limited ( 02/15/18)     Time  4    Period  Weeks    Status  New             Plan - 01/18/18 1303    Clinical Impression Statement  57 yo male with ~ 3 wk h/o low back pain after bending over and hearing a pop.  He has signifcant tightness in his lumbar paraspinals and gluts. These are limiting his ROM and ability to perform work and ADLs.     Clinical Presentation  Stable    Clinical Decision Making  Low    Rehab Potential  Excellent    PT Frequency  1x / week    PT Duration  4 weeks    PT Treatment/Interventions  Dry needling;Manual techniques;Moist Heat;Traction;Ultrasound;Patient/family education;Taping;Therapeutic exercise;Cryotherapy;Electrical Stimulation    PT Next Visit Plan  assess response to DN/manual work continue PRN, teach deep core strengthening.     Consulted and Agree with Plan of Care  Patient       Patient will benefit from skilled therapeutic intervention in order to improve the following deficits and impairments:  Pain, Increased muscle spasms, Decreased range of motion, Obesity  Visit Diagnosis: Acute bilateral low back pain, with sciatica presence unspecified - Plan: PT plan of care cert/re-cert  Other symptoms and signs involving the musculoskeletal system - Plan: PT plan of care cert/re-cert  Muscle spasm of back - Plan: PT plan of care cert/re-cert     Problem List Patient Active Problem List   Diagnosis Date Noted  . Former smoker 07/25/2017  . Cough 07/25/2017  . 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 Non-restorative sleep 07/25/2017  . BPH without obstruction/lower urinary tract symptoms 06/12/2017  . No energy 06/12/2017  . Elevated fasting glucose 06/10/2017  . Low  testosterone 06/10/2017  . 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 History of pancreatitis 05/27/2017  . Diverticulitis 05/27/2017  . Atelectasis of both lungs 11/20/2016  . Hyperlipidemia 05/19/2016  . Hypertriglyceridemia 05/19/2016  . Diverticulosis of colon without hemorrhage 05/19/2016  . IBS (irritable bowel syndrome) 05/19/2016  . Essential hypertension, benign 05/19/2016  . GERD (gastroesophageal reflux disease) 05/19/2016  . Allergic rhinitis 05/19/2016  .  OSA on CPAP 05/19/2016  . Generalized anxiety disorder 05/19/2016  . Mood changes 05/19/2016  . Low back pain 05/05/2016  . Muscle spasm of back 05/05/2016    Jeral Pinch PT  01/18/2018, 1:11 PM  [facility] [address] [place], [place], [address] Phone: [phone]   Fax:  [phone]  Name: Jake [name] MRN: [id number] Date of Birth: [birth year]

## 2018-01-18 NOTE — Patient Instructions (Signed)

## 2018-01-25 DIAGNOSIS — R69 Illness, unspecified: Secondary | ICD-10-CM | POA: Diagnosis not present

## 2018-01-28 ENCOUNTER — Encounter: Payer: Self-pay | Admitting: Physical Therapy

## 2018-01-28 ENCOUNTER — Ambulatory Visit (INDEPENDENT_AMBULATORY_CARE_PROVIDER_SITE_OTHER): Payer: Medicare HMO | Admitting: Physical Therapy

## 2018-01-28 DIAGNOSIS — M62830 Muscle spasm of back: Secondary | ICD-10-CM

## 2018-01-28 DIAGNOSIS — R29898 Other symptoms and signs involving the musculoskeletal system: Secondary | ICD-10-CM

## 2018-01-28 DIAGNOSIS — M545 Low back pain: Secondary | ICD-10-CM | POA: Diagnosis not present

## 2018-01-28 NOTE — Therapy (Addendum)
[facility] [place], [place], [address] Phone: [phone]   Fax:  [phone]  Physical Therapy Treatment  Patient Details  Name: Jake [name] MRN: [id number] Date of Birth: 08-04-1961 Referring Provider: [name] PA   Encounter Date: 01/28/2018  PT End of Session - 01/28/18 1404    Visit Number  2    Number of Visits  4    Date for PT Re-Evaluation  02/15/18    PT Start Time  6606    PT Stop Time  1505    PT Time Calculation (min)  61 min    Activity Tolerance  Patient tolerated treatment well       Past Medical History:  Diagnosis Date  . Bipolar disorder ([place])   . GERD (gastroesophageal reflux disease)   . Hyperlipidemia   . Hypertension     Past Surgical History:  Procedure Laterality Date  . ANKLE FRACTURE SURGERY Left   . APPENDECTOMY    . CERVICAL FUSION    . CHOLECYSTECTOMY    . COLON SURGERY    . HERNIA REPAIR      There were no vitals filed for this visit.  Subjective Assessment - 01/28/18 1405    Subjective  Pt reports that the needling helped however he is still having back pain, states 30% improvement,  He is still out of work . Rt shoulder and arm are doing numb everyday - icing neck helps it.     Patient Stated Goals  get rid of pain and go back to work.  WOrks in garden center at [employer]    Currently in Pain?  Yes    Pain Score  5     Pain Location  Back    Pain Orientation  Lower    Pain Descriptors / Indicators  Tightness    Pain Type  Acute pain    Pain Onset  More than a month ago    Pain Frequency  Intermittent    Aggravating Factors   bending forward, trying to lift    Pain Relieving Factors  stretches and ice on neck.                        [facility] PT Treatment/Exercise - 01/28/18 0001      Lumbar Exercises: Aerobic   UBE (Upper Arm Bike)  L2x4' in standing using some trunk rotation, FWD/BWD      Lumbar Exercises: Supine    Pelvic Tilt  10 reps;5 seconds    Bridge  10 reps      Lumbar Exercises: Prone   Opposite Arm/Leg Raise  Right arm/Left leg;Left arm/Right leg;10 reps      Modalities   Modalities  Electrical Stimulation;Moist Heat      Moist Heat Therapy   Number Minutes Moist Heat  20 Minutes    Moist Heat Location  Lumbar Spine      Electrical Stimulation   Electrical Stimulation Location  lumbar bilat    Electrical Stimulation Action  IFC    Electrical Stimulation Parameters  to tolerance    Electrical Stimulation Goals  Tone;Pain      Manual Therapy   Manual Therapy  Soft tissue mobilization    Soft tissue mobilization  STM to bilat lumbar and lower thoracic paraspinals, Rt tighter than Lt, TPR into Rt QL and piriformis       Trigger Point Dry Needling - 01/28/18 1417    Consent  Given?  Yes    Education Handout Provided  No    Muscles Treated Upper Body  Longissimus;Quadratus Lumborum Rt with stim to QL     Longissimus Response  Palpable increased muscle length;Twitch response elicited P6-P95 with stim           PT Education - 01/28/18 1410    Education provided  Yes    Education Details  HEP     Person(s) Educated  Patient    Methods  Explanation;Handout    Comprehension  Returned demonstration;Verbalized understanding          PT Long Term Goals - 01/28/18 1434      PT LONG TERM GOAL #1   Title  I with advanced HEP ( 02/15/18)     Status  On-going      PT LONG TERM GOAL #2   Title  report =/> 75% reduction of LBP to allow him to return to work ( 02/15/18)     Status  On-going 30% improvement      PT LONG TERM GOAL #3   Title  demo lumbar ROM WFL and minimal to no pain ( 02/15/18)     Status  On-going      PT LONG TERM GOAL #4   Title  improve FOTO =/< 49% limited ( 02/15/18)     Status  On-going            Plan - 01/28/18 1438    Clinical Impression Statement  Ronalee Belts has not been able to return to work yet, still very limited in function due to back  pain/tightness.  He is beginning to have neck and arm symptoms most likely due to restrictions in lumbar spine placing stress up the chain. The lumbar paraspinals are not as tight as last week however they are still tight. It is his econd treatment, no goals met yet.  Ronalee Belts was encouraged to increase his walking.     PT Treatment/Interventions  Dry needling;Manual techniques;Moist Heat;Traction;Ultrasound;Patient/family education;Taping;Therapeutic exercise;Cryotherapy;Electrical Stimulation    PT Next Visit Plan  cont DN and manual work, when read add core strengthening    Consulted and Agree with Plan of Care  Patient       Patient will benefit from skilled therapeutic intervention in order to improve the following deficits and impairments:     Visit Diagnosis: Acute bilateral low back pain, with sciatica presence unspecified  Other symptoms and signs involving the musculoskeletal system  Muscle spasm of back     Problem List Patient Active Problem List   Diagnosis Date Noted  . Former smoker 07/25/2017  . Cough 07/25/2017  . 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 Non-restorative sleep 07/25/2017  . BPH without obstruction/lower urinary tract symptoms 06/12/2017  . No energy 06/12/2017  . Elevated fasting glucose 06/10/2017  . Low testosterone 06/10/2017  . 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 History of pancreatitis 05/27/2017  . Diverticulitis 05/27/2017  . Atelectasis of both lungs 11/20/2016  . Hyperlipidemia 05/19/2016  . Hypertriglyceridemia 05/19/2016  . Diverticulosis of colon without hemorrhage 05/19/2016  . IBS (irritable bowel syndrome) 05/19/2016  . Essential hypertension, benign 05/19/2016  . GERD (gastroesophageal reflux disease) 05/19/2016  . Allergic rhinitis 05/19/2016  . OSA on CPAP 05/19/2016  . Generalized anxiety disorder 05/19/2016  . Mood changes 05/19/2016  . Low back pain 05/05/2016  . Muscle spasm of back 05/05/2016    Jeral Pinch PT   01/28/2018, 2:56 PM  [facility] [phone]  [place], [place], [address] Phone: [phone]   Fax:  [phone]  Name: Jakwon Gayton MRN: [id number] Date of Birth: Mar 17, 1961   PHYSICAL THERAPY DISCHARGE SUMMARY  Visits from Start of Care: 2  Current functional level related to goals / functional outcomes: Unknown, patient has not returned since this visit and MD note reports he is going to have an MRI   Remaining deficits: unknown   Education / Equipment: Initial HEP and DN Plan: Patient agrees to discharge.  Patient goals were not met. Patient is being discharged due to the patient's request.  ?????   Jeral Pinch, PT 02/16/18 9:15 AM

## 2018-01-28 NOTE — Patient Instructions (Addendum)
Flexibility: Neck Retraction    Pull head straight back, keeping eyes and jaw level. Repeat _5-10___ times per set. Do __1__ sets per session. Do __multiple__ sessions per day.  Scapular Retraction (Standing)    With arms at sides, pinch shoulder blades together. Repeat _5-10___ times per set. Do __1__ sets per session. Do _multiple___ sessions per day.

## 2018-02-07 ENCOUNTER — Ambulatory Visit: Payer: Medicare HMO | Admitting: Family Medicine

## 2018-02-07 VITALS — BP 160/80 | HR 95 | Ht 71.0 in | Wt 237.0 lb

## 2018-02-07 DIAGNOSIS — M545 Low back pain, unspecified: Secondary | ICD-10-CM

## 2018-02-07 NOTE — Patient Instructions (Signed)
Thank you for coming in today. Recheck a day or two after MRI.   You should hear about MRI soon.

## 2018-02-07 NOTE — Progress Notes (Signed)
Jake [name] is a 57 y.o. male who presents to [facility] today for follow up back pain.   Jake [name] is here to follow up back pain. He developed pain on or around 12/24/17. He was seen in clinic on 12/30/17. He was though to have a lumbosacral strain.  CT scan of the abdomen pelvis images from September 2018 reviewed showing mild L-spine DDD.  He was treated conservatively with physical therapy trial.  He notes he continues to experience moderate back pain interfering with his ability to work.  He has difficulty bending prolonged standing or sitting squatting.  He has difficulties with his activities at home and has trouble cleaning occasionally bathing or putting clothes on.  He denies severe radiating pain weakness or new numbness.  He notes that physical therapy was not very helpful.    ROS:  As above  Exam:  BP (!) 160/80   Pulse 95   Ht 5\' 11"  (1.803 m)   Wt 237 lb (107.5 kg)   BMI 33.05 kg/m  General: Well Developed, well nourished, and in no acute distress.  Neuro/Psych: Alert and oriented x3, extra-ocular muscles intact, able to move all 4 extremities, sensation grossly intact. Skin: Warm and dry, no rashes noted.  Respiratory: Not using accessory muscles, speaking in full sentences, trachea midline.  Cardiovascular: Pulses palpable, no extremity edema. Abdomen: Does not appear distended. MSK:  L-spine: Nontender to palpation left lumbar paraspinal muscle group SI joint and upper buttocks. Lumbar motion significantly limited by pain. Lower extremity strength is intact throughout. Reflexes are intact throughout.   Lab and Radiology Results CT scan Abd and pelvis from 05/26/17 images reviewed showing mild or moderate degenerative changes mostly at L5-S1  Assessment and Plan: 57 y.o. male with  Low back pain interfering with work.  Patient at this point has failed conservative management and will obtain MRI.  He had a trial of  physical therapy longer than 6 weeks and is still painful.  Plan for MRI for Facet injection trial.   I spent 15 minutes with this patient, greater than 50% was face-to-face time counseling regarding treatment plan and facet injection and medial branch ablation if needed.  ST disability form filled out.   Orders Placed This Encounter  Procedures  . MR Lumbar Spine Wo Contrast    Standing Status:   Future    Standing Expiration Date:   04/10/2019    Order Specific Question:   What is the patient's sedation requirement?    Answer:   No Sedation    Order Specific Question:   Does the patient have a pacemaker or implanted devices?    Answer:   No    Order Specific Question:   Preferred imaging location?    Answer:   Product/process development scientist (table limit-350lbs)    Order Specific Question:   Radiology Contrast Protocol - do NOT remove file path    Answer:   [email or web address]   No orders of the defined types were placed in this encounter.   Historical information moved to improve visibility of documentation.  Past Medical History:  Diagnosis Date  . Bipolar disorder ([place])   . GERD (gastroesophageal reflux disease)   . Hyperlipidemia   . Hypertension    Past Surgical History:  Procedure Laterality Date  . ANKLE FRACTURE SURGERY Left   . APPENDECTOMY    . CERVICAL FUSION    . CHOLECYSTECTOMY    . COLON SURGERY    . HERNIA REPAIR  Social History   Tobacco Use  . Smoking status: Former Research scientist (life sciences)  . Smokeless tobacco: Never Used  Substance Use Topics  . Alcohol use: No   family history includes Alcohol abuse in his father; Alzheimer's disease in his father; Cancer in his father, maternal grandfather, mother, and paternal grandmother; Depression in his mother; Hyperlipidemia in his father and mother.  Medications: Current Outpatient Medications  Medication Sig Dispense Refill  . AMBULATORY NON FORMULARY MEDICATION Continuous positive airway pressure  (CPAP) machine set at autotitration of H2O pressure, with all supplemental supplies as needed. 1 each 0  . colestipol (COLESTID) 1 g tablet Take 1 tablet (1 g total) by mouth daily. 90 tablet 4  . dicyclomine (BENTYL) 20 MG tablet TAKE 1 TABLET BY MOUTH THREE TIMES DAILY -  MUST  SCHEDULE  FOLLOW  UP  APPOINTMENT 90 tablet 5  . ipratropium (ATROVENT) 0.06 % nasal spray Place 2 sprays into both nostrils 4 (four) times daily. 15 mL 1  . loratadine (CLARITIN) 10 MG tablet Take 10 mg by mouth daily.    [name] Kitchen OLANZapine (ZYPREXA) 15 MG tablet Take 15 mg by mouth at bedtime.    . ondansetron (ZOFRAN) 8 MG tablet Take by mouth as needed for nausea or vomiting.    . pantoprazole (PROTONIX) 40 MG tablet TAKE 1 TABLET BY MOUTH TWICE DAILY 180 tablet 1  . sertraline (ZOLOFT) 100 MG tablet Take 100 mg daily by mouth.    . sodium bicarbonate 650 MG tablet Take 1 tablet (650 mg total) 4 (four) times daily by mouth. 120 tablet 5   No current facility-administered medications for this visit.    Allergies  Allergen Reactions  . Nitroglycerin Nausea And Vomiting  . Contrast Media [Iodinated Diagnostic Agents] Palpitations      Discussed warning signs or symptoms. Please see discharge instructions. Patient expresses understanding.

## 2018-02-14 ENCOUNTER — Ambulatory Visit (INDEPENDENT_AMBULATORY_CARE_PROVIDER_SITE_OTHER): Payer: Medicare HMO

## 2018-02-14 DIAGNOSIS — M47816 Spondylosis without myelopathy or radiculopathy, lumbar region: Secondary | ICD-10-CM

## 2018-02-14 DIAGNOSIS — M545 Low back pain, unspecified: Secondary | ICD-10-CM

## 2018-02-16 ENCOUNTER — Encounter: Payer: Self-pay | Admitting: Family Medicine

## 2018-02-16 ENCOUNTER — Ambulatory Visit: Payer: Medicare HMO | Admitting: Family Medicine

## 2018-02-16 VITALS — BP 152/72 | HR 85 | Wt 238.0 lb

## 2018-02-16 DIAGNOSIS — N401 Enlarged prostate with lower urinary tract symptoms: Secondary | ICD-10-CM | POA: Diagnosis not present

## 2018-02-16 DIAGNOSIS — M545 Low back pain, unspecified: Secondary | ICD-10-CM

## 2018-02-16 DIAGNOSIS — R3915 Urgency of urination: Secondary | ICD-10-CM | POA: Diagnosis not present

## 2018-02-16 MED ORDER — TAMSULOSIN HCL 0.4 MG PO CAPS: 0.4000 mg | ORAL_CAPSULE | Freq: Every day | ORAL | 3 refills | Status: DC

## 2018-02-16 NOTE — Patient Instructions (Signed)
Thank you for coming in today. Start flomax for BPH symptoms.  You should hear about injections soon.  Let me know if you do not hear anything.  Return as needed.    Benign Prostatic Hyperplasia Benign prostatic hyperplasia (BPH) is an enlarged prostate gland that is caused by the normal aging process and not by cancer. The prostate is a walnut-sized gland that is involved in the production of semen. It is located in front of the rectum and below the bladder. The bladder stores urine and the urethra is the tube that carries the urine out of the body. The prostate may get bigger as a man gets older. An enlarged prostate can press on the urethra. This can make it harder to pass urine. The build-up of urine in the bladder can cause infection. Back pressure and infection may progress to bladder damage and kidney (renal) failure. What are the causes? This condition is part of a normal aging process. However, not all men develop problems from this condition. If the prostate enlarges away from the urethra, urine flow will not be blocked. If it enlarges toward the urethra and compresses it, there will be problems passing urine. What increases the risk? This condition is more likely to develop in men over the age of 41 years. What are the signs or symptoms? Symptoms of this condition include:  Getting up often during the night to urinate.  Needing to urinate frequently during the day.  Difficulty starting urine flow.  Decrease in size and strength of your urine stream.  Leaking (dribbling) after urinating.  Inability to pass urine. This needs immediate treatment.  Inability to completely empty your bladder.  Pain when you pass urine. This is more common if there is also an infection.  Urinary tract infection (UTI).  How is this diagnosed? This condition is diagnosed based on your medical history, a physical exam, and your symptoms. Tests will also be done, such as:  A post-void bladder  scan. This measures any amount of urine that may remain in your bladder after you finish urinating.  A digital rectal exam. In a rectal exam, your health care provider checks your prostate by putting a lubricated, gloved finger into your rectum to feel the back of your prostate gland. This exam detects the size of your gland and any abnormal lumps or growths.  An exam of your urine (urinalysis).  A prostate specific antigen (PSA) screening. This is a blood test used to screen for prostate cancer.  An ultrasound. This test uses sound waves to electronically produce a picture of your prostate gland.  Your health care provider may refer you to a specialist in kidney and prostate diseases (urologist). How is this treated? Once symptoms begin, your health care provider will monitor your condition (active surveillance or watchful waiting). Treatment for this condition will depend on the severity of your condition. Treatment may include:  Observation and yearly exams. This may be the only treatment needed if your condition and symptoms are mild.  Medicines to relieve your symptoms, including: ? Medicines to shrink the prostate. ? Medicines to relax the muscle of the prostate.  Surgery in severe cases. Surgery may include: ? Prostatectomy. In this procedure, the prostate tissue is removed completely through an open incision or with a laparascope or robotics. ? Transurethral resection of the prostate (TURP). In this procedure, a tool is inserted through the opening at the tip of the penis (urethra). It is used to cut away tissue of the  inner core of the prostate. The pieces are removed through the same opening of the penis. This removes the blockage. ? Transurethral incision (TUIP). In this procedure, small cuts are made in the prostate. This lessens the prostate's pressure on the urethra. ? Transurethral microwave thermotherapy (TUMT). This procedure uses microwaves to create heat. The heat destroys  and removes a small amount of prostate tissue. ? Transurethral needle ablation (TUNA). This procedure uses radio frequencies to destroy and remove a small amount of prostate tissue. ? Interstitial laser coagulation ([place]). This procedure uses a laser to destroy and remove a small amount of prostate tissue. ? Transurethral electrovaporization (TUVP). This procedure uses electrodes to destroy and remove a small amount of prostate tissue. ? Prostatic urethral lift. This procedure inserts an implant to push the lobes of the prostate away from the urethra.  Follow these instructions at home:  Take over-the-counter and prescription medicines only as told by your health care provider.  Monitor your symptoms for any changes. Contact your health care provider with any changes.  Avoid drinking large amounts of liquid before going to bed or out in public.  Avoid or reduce how much caffeine or alcohol you drink.  Give yourself time when you urinate.  Keep all follow-up visits as told by your health care provider. This is important. Contact a health care provider if:  You have unexplained back pain.  Your symptoms do not get better with treatment.  You develop side effects from the medicine you are taking.  Your urine becomes very dark or has a bad smell.  Your lower abdomen becomes distended and you have trouble passing your urine. Get help right away if:  You have a fever or chills.  You suddenly cannot urinate.  You feel lightheaded, or very dizzy, or you faint.  There are large amounts of blood or clots in the urine.  Your urinary problems become hard to manage.  You develop moderate to severe low back or flank pain. The flank is the side of your body between the ribs and the hip. These symptoms may represent a serious problem that is an emergency. Do not wait to see if the symptoms will go away. Get medical help right away. Call your local emergency services (911 in the [place] Do  not drive yourself to the hospital. Summary  Benign prostatic hyperplasia (BPH) is an enlarged prostate that is caused by the normal aging process and not by cancer.  An enlarged prostate can press on the urethra. This can make it hard to pass urine.  This condition is part of a normal aging process and is more likely to develop in men over the age of 68 years.  Get help right away if you suddenly cannot urinate. This information is not intended to replace advice given to you by your health care provider. Make sure you discuss any questions you have with your health care provider. Document Released: 08/24/2005 Document Revised: 09/28/2016 Document Reviewed: 09/28/2016 Elsevier Interactive Patient Education  [employer].

## 2018-02-16 NOTE — Progress Notes (Addendum)
Jake [name] is a 57 y.o. male who presents to [facility] [facility] today for follow-up lumbar MRI.  Jake [name] was seen starting in late April for low back pain.  He denies any radiating pain.  He had a trial of physical therapy and failed to improve.  On recheck on June 3 at that point we proceeded to obtain an MRI of his lumbar spine for facet injection planning.  MRI was obtained on Monday, June 10 and he is here today for recheck he notes continued bothersome low back pain worse with activity better with rest.  MRI significant for facet arthritis at L4-L5 and L5-S1.  Additionally there was an incidentally noted hemangioma at the S1 vertebral body.  Patient brings up that he has urinary frequency and urgency.  He notes a pertinent history for BPH.  He denies severe nocturia.  He notes that he often has to urinate during the day and finds it annoying.  He does not currently take any medications for the symptoms. AUA symptom score 15/35 Quality of life score 3/6 (score will be scanned)   ROS as above:  Exam:  BP (!) 152/72   Pulse 85   Wt 238 lb (108 kg)   BMI 33.19 kg/m  Gen: Well NAD HEENT: EOMI,  MMM Lungs: Normal work of breathing. CTABL Heart: RRR no MRG Abd: NABS, Soft. Nondistended, Nontender Exts: Brisk capillary refill, warm and well perfused.  L-spine: Nontender.  Lower extreme strength is intact normal gait. Rectal exam normal rectal tone.  Large nontender smooth prostate  Lab and Radiology Results No results found for this or any previous visit (from the past 72 hour(s)). Mr Lumbar Spine Wo Contrast  Result Date: 02/14/2018 CLINICAL DATA:  Low back pain for 5 weeks. EXAM: MRI LUMBAR SPINE WITHOUT CONTRAST TECHNIQUE: Multiplanar, multisequence MR imaging of the lumbar spine was performed. No intravenous contrast was administered. COMPARISON:  None. FINDINGS:  Segmentation:  Standard. Alignment:  Physiologic. Vertebrae: No fracture, evidence of discitis, or aggressive bone lesion. T1 and T2 hyperintense S1 vertebral body bone lesion most consistent with a hemangioma. Conus medullaris and cauda equina: Conus extends to the T12-L1 level. Conus and cauda equina appear normal. Paraspinal and other soft tissues: No acute paraspinal abnormality. Disc levels: Disc spaces: Disc desiccation throughout the lumbar spine. T12-L1: No significant disc bulge. No evidence of neural foraminal stenosis. No central canal stenosis. L1-L2: No significant disc bulge. No evidence of neural foraminal stenosis. No central canal stenosis. L2-L3: No significant disc bulge. No evidence of neural foraminal stenosis. No central canal stenosis. L3-L4: Broad-based disc bulge eccentric towards the right. Right lateral annular fissure abutting the right L3 nerve root. No evidence of neural foraminal stenosis. No central canal stenosis. Bilateral lateral recess narrowing. L4-L5: Broad-based disc bulge with a central annular fissure. Mild bilateral facet arthropathy. Bilateral lateral recess narrowing. No evidence of neural foraminal stenosis. No central canal stenosis. L5-S1: Small broad central disc protrusion. Mild bilateral facet arthropathy. No evidence of neural foraminal stenosis. No central canal stenosis. IMPRESSION: 1.  No acute osseous injury of the lumbar spine. 2. Lumbar spine spondylosis as described above. Electronically Signed   By: Kathreen Devoid   On: 02/14/2018 11:18  I personally (independently) visualized and performed the interpretation of the images attached in this note.  Additionally CT scan of the abdomen and pelvis from September 2018 images reviewed.  There appears to be a stable sized hypodensity seen  in the sacral vertebral body consistent in location with hemangioma.   Last PSA values reviewed.  PSA 1.2 06/09/2017    Assessment and Plan: 57 y.o. male with  Lumbar  pain: After discussion with patient plan to proceed with L4-L5 bilateral facet injection. I think the hemangioma is unlikely to be the main cause of pain.  It appears to be stable based on appearance of CT scan of the abdomen and pelvis from 2018.  However I will discuss with radiology.  Additionally patient has lower urinary tract symptoms consistent with BPH.  He has enlarged prostate and normal PSA on previous testing.  Plan to start tamsulosin and have patient follow-up with PCP for further evaluation treatment of this issue.   Orders Placed This Encounter  Procedures  . DG FACET JT INJ L /S SINGLE LEVEL LEFT W/FL/CT    Order Specific Question:   Reason for exam:    Answer:   BL L4-5    Order Specific Question:   Preferred imaging location?    Answer:   [facility]   Meds ordered this encounter  Medications  . tamsulosin (FLOMAX) 0.4 MG CAPS capsule    Sig: Take 1 capsule (0.4 mg total) by mouth daily.    Dispense:  30 capsule    Refill:  3     Historical information moved to improve visibility of documentation.  Past Medical History:  Diagnosis Date  . Bipolar disorder ([place])   . GERD (gastroesophageal reflux disease)   . Hyperlipidemia   . Hypertension    Past Surgical History:  Procedure Laterality Date  . ANKLE FRACTURE SURGERY Left   . APPENDECTOMY    . CERVICAL FUSION    . CHOLECYSTECTOMY    . COLON SURGERY    . HERNIA REPAIR     Social History   Tobacco Use  . Smoking status: Former Research scientist (life sciences)  . Smokeless tobacco: Never Used  Substance Use Topics  . Alcohol use: No   family history includes Alcohol abuse in his father; Alzheimer's disease in his father; Cancer in his father, maternal grandfather, mother, and paternal grandmother; Depression in his mother; Hyperlipidemia in his father and mother.  Medications: Current Outpatient Medications  Medication Sig Dispense Refill  . AMBULATORY NON FORMULARY MEDICATION Continuous positive airway pressure  (CPAP) machine set at autotitration of H2O pressure, with all supplemental supplies as needed. 1 each 0  . colestipol (COLESTID) 1 g tablet Take 1 tablet (1 g total) by mouth daily. 90 tablet 4  . dicyclomine (BENTYL) 20 MG tablet TAKE 1 TABLET BY MOUTH THREE TIMES DAILY -  MUST  SCHEDULE  FOLLOW  UP  APPOINTMENT 90 tablet 5  . ipratropium (ATROVENT) 0.06 % nasal spray Place 2 sprays into both nostrils 4 (four) times daily. 15 mL 1  . loratadine (CLARITIN) 10 MG tablet Take 10 mg by mouth daily.    [name] Kitchen OLANZapine (ZYPREXA) 15 MG tablet Take 15 mg by mouth at bedtime.    . ondansetron (ZOFRAN) 8 MG tablet Take by mouth as needed for nausea or vomiting.    . pantoprazole (PROTONIX) 40 MG tablet TAKE 1 TABLET BY MOUTH TWICE DAILY 180 tablet 1  . sertraline (ZOLOFT) 100 MG tablet Take 100 mg daily by mouth.    . sodium bicarbonate 650 MG tablet Take 1 tablet (650 mg total) 4 (four) times daily by mouth. 120 tablet 5  . tamsulosin (FLOMAX) 0.4 MG CAPS capsule Take 1 capsule (0.4 mg total) by mouth  daily. 30 capsule 3   No current facility-administered medications for this visit.    Allergies  Allergen Reactions  . Nitroglycerin Nausea And Vomiting  . Contrast Media [Iodinated Diagnostic Agents] Palpitations    Health Maintenance Health Maintenance  Topic Date Due  . Hepatitis C Screening  [birth year]  . COLONOSCOPY  01/06/2011  . HIV Screening  06/09/2018 (Originally 01/[birth year]/1976)  . INFLUENZA VACCINE  04/07/2018  . TETANUS/TDAP  04/14/2025    Discussed warning signs or symptoms. Please see discharge instructions. Patient expresses understanding.

## 2018-02-23 ENCOUNTER — Other Ambulatory Visit: Payer: Self-pay | Admitting: Family Medicine

## 2018-02-23 DIAGNOSIS — M545 Low back pain, unspecified: Secondary | ICD-10-CM

## 2018-02-24 ENCOUNTER — Ambulatory Visit
Admission: RE | Admit: 2018-02-24 | Discharge: 2018-02-24 | Disposition: A | Discharge status: Home / Self Care | Payer: Medicare HMO | Source: Ambulatory Visit | Attending: Family Medicine | Admitting: Family Medicine

## 2018-02-24 DIAGNOSIS — M545 Low back pain, unspecified: Secondary | ICD-10-CM

## 2018-02-24 DIAGNOSIS — M4696 Unspecified inflammatory spondylopathy, lumbar region: Secondary | ICD-10-CM | POA: Diagnosis not present

## 2018-02-24 MED ORDER — METHYLPREDNISOLONE ACETATE 40 MG/ML INJ SUSP (RADIOLOG
120.0000 mg | Freq: Once | INTRAMUSCULAR | Status: AC
  Administered 2018-02-24: 120 mg via INTRA_ARTICULAR

## 2018-02-24 MED ORDER — IOPAMIDOL (ISOVUE-M 200) INJECTION 41%
1.0000 mL | Freq: Once | INTRAMUSCULAR | Status: AC
  Administered 2018-02-24: 1 mL via INTRA_ARTICULAR

## 2018-02-24 NOTE — Discharge Instructions (Signed)

## 2018-02-25 DIAGNOSIS — R69 Illness, unspecified: Secondary | ICD-10-CM | POA: Diagnosis not present

## 2018-03-02 ENCOUNTER — Other Ambulatory Visit: Payer: Medicare HMO

## 2018-03-07 ENCOUNTER — Encounter: Payer: Self-pay | Admitting: Family Medicine

## 2018-03-07 ENCOUNTER — Ambulatory Visit: Payer: Medicare HMO | Admitting: Family Medicine

## 2018-03-07 VITALS — BP 150/79 | HR 91 | Wt 238.0 lb

## 2018-03-07 DIAGNOSIS — M545 Low back pain, unspecified: Secondary | ICD-10-CM

## 2018-03-07 NOTE — Progress Notes (Signed)
Jake [name] is a 57 y.o. male who presents to [facility] today for back pain.  Jake [name] has been seen multiple times for back pain.  He received a facet injection to BL L4-5 on 02/24/18. He notes that the pain was only temporarily controlled.  He notes the pain has returned.  He is interested in more physical therapy as he thinks he could do better with core stabilization and strengthening techniques as well as potential facet nerve ablation.  He denies any radiating pain weakness or numbness fevers or chills.     ROS:  As above  Exam:  BP (!) 150/79   Pulse 91   Wt 238 lb (108 kg)   BMI 33.19 kg/m  General: Well Developed, well nourished, and in no acute distress.  Neuro/Psych: Alert and oriented x3, extra-ocular muscles intact, able to move all 4 extremities, sensation grossly intact. Skin: Warm and dry, no rashes noted.  Respiratory: Not using accessory muscles, speaking in full sentences, trachea midline.  Cardiovascular: Pulses palpable, no extremity edema. Abdomen: Does not appear distended. MSK:  L-spine nontender to midline.  Normal gait.  Lower extremity strength is intact.      Assessment and Plan: 57 y.o. male with  Low back pain: Proceed with medial branch block and facet nerve ablation at L4-L5 bilaterally.  Additionally retry physical therapy to work on core stabilization techniques.  Recheck in about 6 weeks or so.  I spent 15 minutes with this patient, greater than 50% was face-to-face time counseling regarding plan and facet injections.     Orders Placed This Encounter  Procedures  . DG Facet Jt Neuro Destruct Sing L/S w/Img Guide    Order Specific Question:   Reason for exam:    Answer:   Need confirmatory medial branch block, and if responsive, radiofrequency ablation of: L4-5    Order Specific Question:   Preferred imaging location?    Answer:   [facility]  . Ambulatory referral to Physical  Therapy    Referral Priority:   Routine    Referral Type:   Physical Medicine    Referral Reason:   Specialty Services Required    Requested Specialty:   Physical Therapy   No orders of the defined types were placed in this encounter.   Historical information moved to improve visibility of documentation.  Past Medical History:  Diagnosis Date  . Bipolar disorder ([place])   . GERD (gastroesophageal reflux disease)   . Hyperlipidemia   . Hypertension    Past Surgical History:  Procedure Laterality Date  . ANKLE FRACTURE SURGERY Left   . APPENDECTOMY    . CERVICAL FUSION    . CHOLECYSTECTOMY    . COLON SURGERY    . HERNIA REPAIR     Social History   Tobacco Use  . Smoking status: Former Research scientist (life sciences)  . Smokeless tobacco: Never Used  Substance Use Topics  . Alcohol use: No   family history includes Alcohol abuse in his father; Alzheimer's disease in his father; Cancer in his father, maternal grandfather, mother, and paternal grandmother; Depression in his mother; Hyperlipidemia in his father and mother.  Medications: Current Outpatient Medications  Medication Sig Dispense Refill  . AMBULATORY NON FORMULARY MEDICATION Continuous positive airway pressure (CPAP) machine set at autotitration of H2O pressure, with all supplemental supplies as needed. 1 each 0  . colestipol (COLESTID) 1 g tablet Take 1 tablet (1 g total) by mouth daily. 90 tablet 4  .  dicyclomine (BENTYL) 20 MG tablet TAKE 1 TABLET BY MOUTH THREE TIMES DAILY -  MUST  SCHEDULE  FOLLOW  UP  APPOINTMENT 90 tablet 5  . ipratropium (ATROVENT) 0.06 % nasal spray Place 2 sprays into both nostrils 4 (four) times daily. 15 mL 1  . loratadine (CLARITIN) 10 MG tablet Take 10 mg by mouth daily.    [name] Kitchen OLANZapine (ZYPREXA) 15 MG tablet Take 15 mg by mouth at bedtime.    . ondansetron (ZOFRAN) 8 MG tablet Take by mouth as needed for nausea or vomiting.    . pantoprazole (PROTONIX) 40 MG tablet TAKE 1 TABLET BY MOUTH TWICE DAILY 180 tablet  1  . sertraline (ZOLOFT) 100 MG tablet Take 100 mg daily by mouth.    . sodium bicarbonate 650 MG tablet Take 1 tablet (650 mg total) 4 (four) times daily by mouth. 120 tablet 5  . tamsulosin (FLOMAX) 0.4 MG CAPS capsule Take 1 capsule (0.4 mg total) by mouth daily. 30 capsule 3   No current facility-administered medications for this visit.    Allergies  Allergen Reactions  . Nitroglycerin Nausea And Vomiting  . Contrast Media [Iodinated Diagnostic Agents] Palpitations      Discussed warning signs or symptoms. Please see discharge instructions. Patient expresses understanding.

## 2018-03-07 NOTE — Patient Instructions (Signed)
Thank you for coming in today. Next step is medial branch block and ablation.  Also work on core strength in PT.  Recheck in about 6 weeks or sooner if not doing well.

## 2018-03-15 ENCOUNTER — Other Ambulatory Visit: Payer: Self-pay | Admitting: Physician Assistant

## 2018-03-16 ENCOUNTER — Ambulatory Visit (INDEPENDENT_AMBULATORY_CARE_PROVIDER_SITE_OTHER): Payer: Medicare HMO | Admitting: Physical Therapy

## 2018-03-16 ENCOUNTER — Encounter: Payer: Self-pay | Admitting: Physical Therapy

## 2018-03-16 DIAGNOSIS — M545 Low back pain, unspecified: Secondary | ICD-10-CM

## 2018-03-16 DIAGNOSIS — M62830 Muscle spasm of back: Secondary | ICD-10-CM

## 2018-03-16 DIAGNOSIS — G8929 Other chronic pain: Secondary | ICD-10-CM

## 2018-03-16 NOTE — Therapy (Signed)
[place], [place], [address] Phone: [phone]   Fax:  [phone]  Physical Therapy Evaluation  Patient Details  Name: Jake [name] MRN: [id number] Date of Birth: [birth year] Referring Provider: Georgina Snell   Encounter Date: 03/16/2018  PT End of Session - 03/16/18 1618    Visit Number  1    Number of Visits  12    Date for PT Re-Evaluation  04/27/18    PT Start Time  1510    PT Stop Time  1600    PT Time Calculation (min)  50 min    Activity Tolerance  Patient tolerated treatment well    Behavior During Therapy  [facility] for tasks assessed/performed       Past Medical History:  Diagnosis Date  . Bipolar disorder ([place])   . GERD (gastroesophageal reflux disease)   . Hyperlipidemia   . Hypertension     Past Surgical History:  Procedure Laterality Date  . ANKLE FRACTURE SURGERY Left   . APPENDECTOMY    . CERVICAL FUSION    . CHOLECYSTECTOMY    . COLON SURGERY    . HERNIA REPAIR      There were no vitals filed for this visit.   Subjective Assessment - 03/16/18 1524    Subjective  Pt relays 2 months ago he injured his back picking something up, he felt a pop and has pain ever since. He had PT in the past with minor relief. He relays he will have nerve ablation within the next month after injection did not help. He now wants to know how he can increase his core.     How long can you sit comfortably?  15 min    How long can you stand comfortably?  15 min    Diagnostic tests  MRI shows "Lumbar spine spondylosis"    Currently in Pain?  Yes    Pain Score  7     Pain Location  Back    Pain Orientation  Right;Left;Lower    Pain Descriptors / Indicators  Aching;Sore    Pain Type  Chronic pain    Pain Onset  More than a month ago    Pain Frequency  Constant    Aggravating Factors   lifting, sitting or standing too long    Pain Relieving Factors  sometimes ice or heat         [facility] PT  Assessment - 03/16/18 0001      Assessment   Medical Diagnosis  Low back pain    Referring Provider  Georgina Snell    Onset Date/Surgical Date  12/28/17    Hand Dominance  Right    Next MD Visit  PRN    Prior Therapy  PT a few months ago      Precautions   Precautions  None      Balance Screen   Has the patient fallen in the past 6 months  No      [place] residence      Prior Function   Level of Independence  Independent    Vocation  Full time employment    Vocation Requirements  in garden center , currently out of work    Leisure  working outside in yard.       Observation/Other Assessments   Focus on Therapeutic Outcomes (FOTO)   53% limited      Posture/Postural Control   Posture/Postural Control  Postural  limitations    Postural Limitations  Rounded Shoulders;Forward head      ROM / Strength   AROM / PROM / Strength  AROM;Strength      AROM   AROM Assessment Site  Lumbar    Lumbar Flexion  75%    Lumbar Extension  25%    Lumbar - Right Side Bend  75%    Lumbar - Left Side Bend  50%    Lumbar - Right Rotation  50%    Lumbar - Left Rotation  50%      Strength   Right/Left Hip  -- Grossly WNL but 4/5 for hip flexion    Right/Left Knee  -- WNL    Right/Left Ankle  -- WNL    Lumbar Flexion  -- poor    Lumbar Extension  -- poor      Flexibility   Hamstrings  70 on Rt and 75 left SLR      Special Tests    Special Tests  Lumbar    Lumbar Tests  FABER test;Slump Test;Straight Leg Raise      FABER test   findings  Positive    Side  -- bilat      Slump test   Findings  Positive    Side  -- bilat      Straight Leg Raise   Findings  Positive    Side   -- bilat                Objective measurements completed on examination: See above findings.      [facility] Adult PT Treatment/Exercise - 03/16/18 0001      Exercises   Exercises  Lumbar      Lumbar Exercises: Stretches   Prone on Elbows Stretch  1 rep;60  seconds      Lumbar Exercises: Seated   Other Seated Lumbar Exercises  seated chops for core X 10 reps bilat      Lumbar Exercises: Supine   Ab Set  -- 3 part core 10 sec X 10    Bridge  10 reps;5 seconds    Other Supine Lumbar Exercises  resisted marches with core engaged 5 sec X 5              PT Education - 03/16/18 1617    Education provided  Yes    Education Details  HEP and POC     Person(s) Educated  Patient    Methods  Explanation;Demonstration;Handout;Verbal cues    Comprehension  Verbalized understanding;Returned demonstration;Need further instruction          PT Long Term Goals - 03/16/18 1625      PT LONG TERM GOAL #1   Title  I with advanced HEP 6 weeks (04/27/18)    Status  New      PT LONG TERM GOAL #2   Title  report =/> 75% reduction of LBP to allow him to return to work. 6 weeks (04/27/18)    Status  New      PT LONG TERM GOAL #3   Title  demo lumbar ROM WFL and minimal to no pain 6 weeks (04/27/18)    Status  New      PT LONG TERM GOAL #4   Title  improve FOTO =/< 45% limited. 6 weeks (04/27/18)     Status  New             Plan - 03/16/18 1619    Clinical Impression Statement  Pt presents with now chronic LBP from lifting injury a few months ago. He has had MRI which onlys show some lumbar spondylosis. He is still out of work and has not had much relief with conservative therapy. He was referred to PT for core strength as he awaits nerve ablation. He does have moderate to severe deficits in core strength, lumbar ROM, hip flexion strength, LE/lumbar tightness and lifting abilites.     Clinical Presentation  Stable    Clinical Decision Making  Moderate    Rehab Potential  Fair    PT Frequency  2x / week 1-2 X/week    PT Duration  6 weeks    PT Treatment/Interventions  Dry needling;Manual techniques;Moist Heat;Traction;Ultrasound;Patient/family education;Taping;Therapeutic exercise;Cryotherapy;Electrical Stimulation    PT Next Visit Plan   review HEP, add slump/LE nerve glide, progress core strength and stretching as able    Consulted and Agree with Plan of Care  Patient       Patient will benefit from skilled therapeutic intervention in order to improve the following deficits and impairments:  Pain, Increased muscle spasms, Decreased range of motion, Obesity, Decreased activity tolerance, Decreased strength  Visit Diagnosis: Chronic bilateral low back pain without sciatica  Muscle spasm of back     Problem List Patient Active Problem List   Diagnosis Date Noted  . Former smoker 07/25/2017  . Cough 07/25/2017  . 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 Non-restorative sleep 07/25/2017  . Benign prostatic hyperplasia (BPH) with urinary urgency 06/12/2017  . No energy 06/12/2017  . Elevated fasting glucose 06/10/2017  . Low testosterone 06/10/2017  . 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 History of pancreatitis 05/27/2017  . Diverticulitis 05/27/2017  . Atelectasis of both lungs 11/20/2016  . Hyperlipidemia 05/19/2016  . Hypertriglyceridemia 05/19/2016  . Diverticulosis of colon without hemorrhage 05/19/2016  . IBS (irritable bowel syndrome) 05/19/2016  . Essential hypertension, benign 05/19/2016  . GERD (gastroesophageal reflux disease) 05/19/2016  . Allergic rhinitis 05/19/2016  . OSA on CPAP 05/19/2016  . Generalized anxiety disorder 05/19/2016  . Mood changes 05/19/2016  . Low back pain 05/05/2016  . Muscle spasm of back 05/05/2016    Debbe Odea, PT, DPT 03/16/2018, 4:52 PM  [facility] [place], [place], [address] Phone: [phone]   Fax:  [phone]  Name: Jermaine Neuharth MRN: [id number] Date of Birth: 1960/11/08

## 2018-03-16 NOTE — Patient Instructions (Signed)
Access Code: [email or web address]  URL: [email or web address]  Date: 03/16/2018  Prepared by: Elsie Ra   Exercises  Supine Piriformis Stretch - 10 reps - 3 sets - 2x daily - 6x weekly  Supine Hamstring Stretch with Strap - 10 reps - 3 sets - 2x daily - 6x weekly  Hooklying Transversus Abdominis Palpation - 10 reps - 1-2 sets - 2x daily - 6x weekly  Supine Bridge - 10 reps - 1-3 sets - 2x daily - 6x weekly  Hooklying Sequential Leg March and Lower - 10 reps - 3 sets - 2x daily - 6x weekly  Seated Diagonal Chop - 10 reps - 1-3 sets - 2x daily - 6x weekly  Static Prone on Elbows - 3 sets - 30 to 60 sec hold - 2x daily - 6x weekly   Pt provided illustrated copy

## 2018-03-21 ENCOUNTER — Other Ambulatory Visit: Payer: Self-pay | Admitting: Family Medicine

## 2018-03-21 DIAGNOSIS — M545 Low back pain, unspecified: Secondary | ICD-10-CM

## 2018-03-29 ENCOUNTER — Encounter: Payer: Self-pay | Admitting: Physical Therapy

## 2018-03-29 ENCOUNTER — Ambulatory Visit (INDEPENDENT_AMBULATORY_CARE_PROVIDER_SITE_OTHER): Payer: Medicare HMO | Admitting: Physical Therapy

## 2018-03-29 DIAGNOSIS — M545 Low back pain, unspecified: Secondary | ICD-10-CM

## 2018-03-29 DIAGNOSIS — G8929 Other chronic pain: Secondary | ICD-10-CM

## 2018-03-29 DIAGNOSIS — M62830 Muscle spasm of back: Secondary | ICD-10-CM | POA: Diagnosis not present

## 2018-03-29 NOTE — Therapy (Signed)
[place], [place], [address] Phone: [phone]   Fax:  [phone]  Physical Therapy Treatment  Patient Details  Name: Jake [name] MRN: [id number] Date of Birth: 12/28/1960 Referring Provider: Georgina Snell   Encounter Date: 03/29/2018  PT End of Session - 03/29/18 1005    Visit Number  2    Number of Visits  12    Date for PT Re-Evaluation  04/27/18    PT Start Time  0930    PT Stop Time  1020    PT Time Calculation (min)  50 min    Activity Tolerance  Patient tolerated treatment well    Behavior During Therapy  [facility] for tasks assessed/performed       Past Medical History:  Diagnosis Date  . Bipolar disorder ([place])   . GERD (gastroesophageal reflux disease)   . Hyperlipidemia   . Hypertension     Past Surgical History:  Procedure Laterality Date  . ANKLE FRACTURE SURGERY Left   . APPENDECTOMY    . CERVICAL FUSION    . CHOLECYSTECTOMY    . COLON SURGERY    . HERNIA REPAIR      There were no vitals filed for this visit.  Subjective Assessment - 03/29/18 0940    Subjective  Pt relays he feels really tight today, he relays he was on vacation so has not done many of his exercises. He has appt for nerve block thursday and then possible injections next    How long can you sit comfortably?  15 min    How long can you stand comfortably?  15 min    Diagnostic tests  MRI shows "Lumbar spine spondylosis"    Patient Stated Goals  get rid of pain and go back to work.  WOrks in garden [employer] at [employer]    Currently in Pain?  Yes    Pain Score  6     Pain Location  Back    Pain Orientation  Right;Left    Pain Descriptors / Indicators  Aching;Sore    Pain Type  Chronic pain    Pain Radiating Towards  none    Pain Onset  More than a month ago    Pain Frequency  Constant    Aggravating Factors   lifting and standing    Pain Relieving Factors  sometimes heat or ice                        [facility] Adult PT Treatment/Exercise - 03/29/18 0001      Neuro Re-ed    Neuro Re-ed Details   core stregthening      Exercises   Exercises  Lumbar      Lumbar Exercises: Stretches   Passive Hamstring Stretch  2 reps;30 seconds;Right;Left    Single Knee to Chest Stretch  2 reps;30 seconds;Right;Left    Lower Trunk Rotation  3 reps;30 seconds    Pelvic Tilt  --    Prone on Elbows Stretch  3 reps;30 seconds    Piriformis Stretch  Right;Left;2 reps;30 seconds      Lumbar Exercises: Supine   Ab Set  10 reps 3 part core 10 sec hold    Pelvic Tilt  20 reps    Dead Bug  10 reps    Bridge  20 reps;5 reps    Other Supine Lumbar Exercises  resisted marches with core engaged 5 sec X 10  Modalities   Modalities  Electrical Stimulation;Moist Heat      Moist Heat Therapy   Number Minutes Moist Heat  15 Minutes    Moist Heat Location  Lumbar Spine      Electrical Stimulation   Electrical Stimulation Location  lumbar bilat    Electrical Stimulation Action  IFC    Electrical Stimulation Parameters  tolerance    Electrical Stimulation Goals  Tone;Pain      Manual Therapy   Manual Therapy  Soft tissue mobilization;Myofascial release    Soft tissue mobilization  STM to bilat lumbar P.S    Myofascial Release  lumbar             PT Education - 03/29/18 1005    Education Details  technique for new exercises and cuing for core engagement    Person(s) Educated  Patient    Methods  Explanation;Demonstration;Tactile cues;Verbal cues    Comprehension  Verbalized understanding;Returned demonstration;Need further instruction          PT Long Term Goals - 03/29/18 1010      PT LONG TERM GOAL #1   Title  I with advanced HEP 6 weeks (04/27/18)    Status  On-going      PT LONG TERM GOAL #2   Title  report =/> 75% reduction of LBP to allow him to return to work. 6 weeks (04/27/18)    Status  On-going      PT LONG TERM GOAL #3   Title  demo  lumbar ROM WFL and minimal to no pain 6 weeks (04/27/18)    Status  On-going      PT LONG TERM GOAL #4   Title  improve FOTO =/< 45% limited. 6 weeks (04/27/18)     Status  On-going            Plan - 03/29/18 1006    Clinical Impression Statement  Session focused on lumbar/LE stretching and core strengthening followed by manual therapy and modalites all to decrease pain and ST restrictions and to increase ROM and lumbar stability. Pt had good effort and tolerance with exercises. Progress as able.    Rehab Potential  Fair    PT Frequency  2x / week    PT Duration  6 weeks    PT Treatment/Interventions  Dry needling;Manual techniques;Moist Heat;Traction;Ultrasound;Patient/family education;Taping;Therapeutic exercise;Cryotherapy;Electrical Stimulation    PT Next Visit Plan  slump held off as he did not have radiculopathy, progress core and stretching as able    Consulted and Agree with Plan of Care  Patient       Patient will benefit from skilled therapeutic intervention in order to improve the following deficits and impairments:  Pain, Increased muscle spasms, Decreased range of motion, Obesity, Decreased activity tolerance, Decreased strength  Visit Diagnosis: Chronic bilateral low back pain without sciatica  Muscle spasm of back     Problem List Patient Active Problem List   Diagnosis Date Noted  . Former smoker 07/25/2017  . Cough 07/25/2017  . 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 Non-restorative sleep 07/25/2017  . Benign prostatic hyperplasia (BPH) with urinary urgency 06/12/2017  . No energy 06/12/2017  . Elevated fasting glucose 06/10/2017  . Low testosterone 06/10/2017  . 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 History of pancreatitis 05/27/2017  . Diverticulitis 05/27/2017  . Atelectasis of both lungs 11/20/2016  . Hyperlipidemia 05/19/2016  . Hypertriglyceridemia 05/19/2016  . Diverticulosis of colon without hemorrhage 05/19/2016  . IBS (irritable bowel  syndrome)  05/19/2016  . Essential hypertension, benign 05/19/2016  . GERD (gastroesophageal reflux disease) 05/19/2016  . Allergic rhinitis 05/19/2016  . OSA on CPAP 05/19/2016  . Generalized anxiety disorder 05/19/2016  . Mood changes 05/19/2016  . Low back pain 05/05/2016  . Muscle spasm of back 05/05/2016    Debbe Odea, PT, DPT 03/29/2018, 10:11 AM  [facility] [place], [place], [address] Phone: [phone]   Fax:  [phone]  Name: Dailen Mcclish MRN: [id number] Date of Birth: [birth year]

## 2018-03-31 ENCOUNTER — Other Ambulatory Visit: Payer: Self-pay | Admitting: Family Medicine

## 2018-03-31 ENCOUNTER — Ambulatory Visit
Admission: RE | Admit: 2018-03-31 | Discharge: 2018-03-31 | Disposition: A | Discharge status: Home / Self Care | Payer: Medicare HMO | Source: Ambulatory Visit | Attending: Family Medicine | Admitting: Family Medicine

## 2018-03-31 DIAGNOSIS — M545 Low back pain, unspecified: Secondary | ICD-10-CM

## 2018-03-31 NOTE — Discharge Instructions (Signed)

## 2018-04-05 ENCOUNTER — Ambulatory Visit: Payer: Medicare HMO | Admitting: Physical Therapy

## 2018-04-05 ENCOUNTER — Encounter: Payer: Self-pay | Admitting: Physical Therapy

## 2018-04-05 DIAGNOSIS — M545 Low back pain, unspecified: Secondary | ICD-10-CM

## 2018-04-05 DIAGNOSIS — G8929 Other chronic pain: Secondary | ICD-10-CM

## 2018-04-05 DIAGNOSIS — M62830 Muscle spasm of back: Secondary | ICD-10-CM | POA: Diagnosis not present

## 2018-04-05 NOTE — Therapy (Signed)
[place], [place], [address] Phone: [phone]   Fax:  [phone]  Physical Therapy Treatment  Patient Details  Name: Jake [name] MRN: [id number] Date of Birth: August 21, 1961 Referring Provider: Georgina Snell   Encounter Date: 04/05/2018  PT End of Session - 04/05/18 1006    Visit Number  3    Number of Visits  12    Date for PT Re-Evaluation  04/27/18    PT Start Time  0935    PT Stop Time  1020    PT Time Calculation (min)  45 min    Activity Tolerance  Patient tolerated treatment well    Behavior During Therapy  [facility] for tasks assessed/performed       Past Medical History:  Diagnosis Date  . Bipolar disorder ([place])   . GERD (gastroesophageal reflux disease)   . Hyperlipidemia   . Hypertension     Past Surgical History:  Procedure Laterality Date  . ANKLE FRACTURE SURGERY Left   . APPENDECTOMY    . CERVICAL FUSION    . CHOLECYSTECTOMY    . COLON SURGERY    . HERNIA REPAIR      There were no vitals filed for this visit.  Subjective Assessment - 04/05/18 0953    Subjective  Pt relays continued tightness, he relays he will have nerve ablation tommorow.    Currently in Pain?  Yes    Pain Score  5     Pain Location  Back    Pain Orientation  Right;Left    Pain Descriptors / Indicators  Aching;Tightness    Pain Type  Chronic pain    Pain Radiating Towards  none    Pain Onset  More than a month ago    Pain Frequency  Constant    Aggravating Factors   lifting and standing    Pain Relieving Factors  heat         [facility] PT Assessment - 04/05/18 0001      Assessment   Medical Diagnosis  Low back pain      AROM   AROM Assessment Site  Lumbar    Lumbar Flexion  75%    Lumbar Extension  75%    Lumbar - Right Side Bend  75%    Lumbar - Left Side Bend  75%    Lumbar - Right Rotation  75%    Lumbar - Left Rotation  75%                   [facility] Adult PT Treatment/Exercise  - 04/05/18 1003      Neuro Re-ed    Neuro Re-ed Details   core stregthening      Exercises   Exercises  Lumbar      Lumbar Exercises: Stretches   Passive Hamstring Stretch  2 reps;30 seconds;Right;Left    Single Knee to Chest Stretch  2 reps;30 seconds;Right;Left    Lower Trunk Rotation  -- 5 sec X 10 bilat    Prone on Elbows Stretch  3 reps;30 seconds    Piriformis Stretch  Right;Left;2 reps;30 seconds      Lumbar Exercises: Standing   Row  20 reps    Theraband Level (Row)  Level 3 (Green)    Shoulder Extension  20 reps    Theraband Level (Shoulder Extension)  Level 3 (Green)      Lumbar Exercises: Supine   Ab Set  10 reps 3 part core 10 sec  hold    Pelvic Tilt  --    Dead Bug  20 reps    Bridge  20 reps;5 reps    Other Supine Lumbar Exercises  resisted marches with core engaged 5 sec X 10      Modalities   Modalities  Electrical Stimulation;Moist Heat      Moist Heat Therapy   Number Minutes Moist Heat  15 Minutes with TENS    Moist Heat Location  Lumbar Spine      Electrical Stimulation   Electrical Stimulation Location  lumbar bilat    Electrical Stimulation Action  TENS    Electrical Stimulation Parameters  tolerance    Electrical Stimulation Goals  Tone;Pain      Manual Therapy   Manual Therapy  --    Soft tissue mobilization  --    Myofascial Release  --                  PT Long Term Goals - 03/29/18 1010      PT LONG TERM GOAL #1   Title  I with advanced HEP 6 weeks (04/27/18)    Status  On-going      PT LONG TERM GOAL #2   Title  report =/> 75% reduction of LBP to allow him to return to work. 6 weeks (04/27/18)    Status  On-going      PT LONG TERM GOAL #3   Title  demo lumbar ROM WFL and minimal to no pain 6 weeks (04/27/18)    Status  On-going      PT LONG TERM GOAL #4   Title  improve FOTO =/< 45% limited. 6 weeks (04/27/18)     Status  On-going            Plan - 04/05/18 1103    Clinical Impression Statement  Pt continues  to have significant pain and tightness and session focused on stretching program and core strengthening. He did have some improvements in lumbar AROM today see updated measurements. Heat and TENS applied post sesssion to decrease pain and tightness.    Rehab Potential  Fair    PT Frequency  2x / week    PT Duration  6 weeks    PT Treatment/Interventions  Dry needling;Manual techniques;Moist Heat;Traction;Ultrasound;Patient/family education;Taping;Therapeutic exercise;Cryotherapy;Electrical Stimulation    PT Next Visit Plan  assess his response to nerve ablation, progress stretching and core as able.    Consulted and Agree with Plan of Care  Patient       Patient will benefit from skilled therapeutic intervention in order to improve the following deficits and impairments:  Pain, Increased muscle spasms, Decreased range of motion, Obesity, Decreased activity tolerance, Decreased strength  Visit Diagnosis: Chronic bilateral low back pain without sciatica  Muscle spasm of back     Problem List Patient Active Problem List   Diagnosis Date Noted  . Former smoker 07/25/2017  . Cough 07/25/2017  . 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 Non-restorative sleep 07/25/2017  . Benign prostatic hyperplasia (BPH) with urinary urgency 06/12/2017  . No energy 06/12/2017  . Elevated fasting glucose 06/10/2017  . Low testosterone 06/10/2017  . 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 History of pancreatitis 05/27/2017  . Diverticulitis 05/27/2017  . Atelectasis of both lungs 11/20/2016  . Hyperlipidemia 05/19/2016  . Hypertriglyceridemia 05/19/2016  . Diverticulosis of colon without hemorrhage 05/19/2016  . IBS (irritable bowel syndrome) 05/19/2016  . Essential hypertension, benign 05/19/2016  . GERD (gastroesophageal  reflux disease) 05/19/2016  . Allergic rhinitis 05/19/2016  . OSA on CPAP 05/19/2016  . Generalized anxiety disorder 05/19/2016  . Mood changes 05/19/2016  . Low back pain  05/05/2016  . Muscle spasm of back 05/05/2016    Debbe Odea, PT, DPT 04/05/2018, 11:26 AM  [facility] [place], [place], [address] Phone: [phone]   Fax:  [phone]  Name: Jake [name] MRN: [id number] Date of Birth: 08-Jul-1961

## 2018-04-06 ENCOUNTER — Telehealth: Payer: Self-pay | Admitting: Family Medicine

## 2018-04-06 ENCOUNTER — Ambulatory Visit
Admission: RE | Admit: 2018-04-06 | Discharge: 2018-04-06 | Disposition: A | Discharge status: Home / Self Care | Payer: Medicare HMO | Source: Ambulatory Visit | Attending: Family Medicine | Admitting: Family Medicine

## 2018-04-06 ENCOUNTER — Other Ambulatory Visit: Payer: Self-pay | Admitting: Family Medicine

## 2018-04-06 DIAGNOSIS — M545 Low back pain, unspecified: Secondary | ICD-10-CM

## 2018-04-06 DIAGNOSIS — F1911 Other psychoactive substance abuse, in remission: Secondary | ICD-10-CM

## 2018-04-06 HISTORY — DX: Other psychoactive substance abuse, in remission: F19.11

## 2018-04-06 MED ORDER — SODIUM CHLORIDE 0.9 % IV SOLN
Freq: Once | INTRAVENOUS | Status: AC
  Administered 2018-04-06: 08:00:00 via INTRAVENOUS

## 2018-04-06 MED ORDER — MIDAZOLAM HCL 2 MG/2ML IJ SOLN
1.0000 mg | INTRAMUSCULAR | Status: DC | PRN
  Administered 2018-04-06 (×4): 1 mg via INTRAVENOUS

## 2018-04-06 MED ORDER — METHYLPREDNISOLONE ACETATE 40 MG/ML INJ SUSP (RADIOLOG
120.0000 mg | Freq: Once | INTRAMUSCULAR | Status: AC
  Administered 2018-04-06: 120 mg via INTRAMUSCULAR

## 2018-04-06 MED ORDER — KETOROLAC TROMETHAMINE 30 MG/ML IJ SOLN
30.0000 mg | Freq: Once | INTRAMUSCULAR | Status: AC
  Administered 2018-04-06: 30 mg via INTRAVENOUS

## 2018-04-06 MED ORDER — FENTANYL CITRATE (PF) 100 MCG/2ML IJ SOLN
25.0000 ug | INTRAMUSCULAR | Status: DC | PRN
  Administered 2018-04-06: 50 ug via INTRAVENOUS

## 2018-04-06 NOTE — Discharge Instructions (Signed)
Radio Frequency Ablation Post Procedure Discharge Instructions ° °1. May resume a regular diet and any medications that you routinely take (including pain medications). °2. No driving day of procedure. °3. Upon discharge go home and rest for at least 4 hours.  May use an ice pack as needed to injection sites on back. °4. Remove bandades later, today. ° ° ° °Please contact our office at [phone] for the following symptoms: ° °· Fever greater than 100 degrees °· Increased swelling, pain, or redness at injection site. ° ° °Thank you for visiting [facility]. °

## 2018-04-06 NOTE — Progress Notes (Signed)
Patient ID: Jake [name], male   DOB: 1961-03-22, 57 y.o.   MRN: [id number]   The patient's wife became upset and agitated when she found out after the procedure that the patient received Fentanyl as part of the RFA.  The patient was informed before the procedure, in the fluoroscopic suite, that he would be receiving Versed and Fentanyl, but voiced no concerns about this.    Patient's wife was upset because [name] is, according to her, a recovering drug addict, and "now he has narcotics in his system."  She also voiced her displeasure because "it is everywhere in his medical record" that he is a recovering drug addict and we should have know this.  We informed the patient's wife that there was no documentation in the medical record under his Problem List, Medical History, Snapshot, or most recent Progress Notes that there was concern for narcotic addiction.  To hopefully alert others who might be taking care of the patient in the future, All Controlled Substances was added to Allergies as an "Other"  and notes were made in the History/Drug Use section of the Medical Record.  The above incident and responses were reviewed with the patient by myself, and he voiced understanding and no concerns.  Patient was pain free in the Recovery area, and did not request additional drugs.  Ordering Provider aware.

## 2018-04-06 NOTE — Telephone Encounter (Signed)
I spoke with Jake [name]. He had a RFA today and had moderate sedation as part of the procedure that included Fentanyl.  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    He is feeling well   Lengthy discussion with patient and wife.  She is quite upset given his history.

## 2018-04-07 ENCOUNTER — Encounter: Payer: Self-pay | Admitting: Family Medicine

## 2018-04-07 DIAGNOSIS — F1911 Other psychoactive substance abuse, in remission: Secondary | ICD-10-CM | POA: Insufficient documentation

## 2018-04-08 ENCOUNTER — Ambulatory Visit (INDEPENDENT_AMBULATORY_CARE_PROVIDER_SITE_OTHER): Payer: Medicare HMO | Admitting: Physician Assistant

## 2018-04-08 ENCOUNTER — Encounter: Payer: Self-pay | Admitting: Physician Assistant

## 2018-04-08 VITALS — BP 145/91 | HR 78 | Ht 71.0 in | Wt 241.0 lb

## 2018-04-08 DIAGNOSIS — G4733 Obstructive sleep apnea (adult) (pediatric): Secondary | ICD-10-CM

## 2018-04-08 NOTE — Progress Notes (Signed)
   Subjective:    Patient ID: Jake [name], male    DOB: [birth year], 57 y.o.   MRN: [id number]  HPI  Patient is a 57 year old male who presents to the clinic to follow-up on his CPAP machine.  He has been using machine for actually the last 3 to 4 months but was unaware he needed to make an appointment for compliance.  I have his compliance report from 03/06/2018 through 04/04/2018 with a 96.7% compliance of greater than 4 hours.  He does feel like this is helping his sleep.  He is not as tired during the day.  He does have a medium mask and is hoping to get a large mask.  He cannot get this until insurance starts to pay after this office visit.  .. Active Ambulatory Problems    Diagnosis Date Noted  . Low back pain 05/05/2016  . Muscle spasm of back 05/05/2016  . Hyperlipidemia 05/19/2016  . Hypertriglyceridemia 05/19/2016  . Diverticulosis of colon without hemorrhage 05/19/2016  . IBS (irritable bowel syndrome) 05/19/2016  . Essential hypertension, benign 05/19/2016  . GERD (gastroesophageal reflux disease) 05/19/2016  . Allergic rhinitis 05/19/2016  . OSA on CPAP 05/19/2016  . Generalized anxiety disorder 05/19/2016  . Mood changes 05/19/2016  . Atelectasis of both lungs 11/20/2016  . 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 History of pancreatitis 05/27/2017  . Diverticulitis 05/27/2017  . Elevated fasting glucose 06/10/2017  . Low testosterone 06/10/2017  . Benign prostatic hyperplasia (BPH) with urinary urgency 06/12/2017  . No energy 06/12/2017  . Former smoker 07/25/2017  . Cough 07/25/2017  . 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 Non-restorative sleep 07/25/2017  . Anxiety 10/30/2015  . Chronic nausea 10/30/2015  . Major depressive disorder 10/30/2015  . Migraine 10/30/2015  . Neck pain 10/30/2015  . Occipital neuralgia 10/30/2015  . Substance abuse in remission ([place]) 04/07/2018   Resolved Ambulatory Problems    Diagnosis Date Noted  . Community acquired pneumonia  of left lower lobe of lung ([place]) 11/20/2016   Past Medical History:  Diagnosis Date  . Bipolar disorder ([place])   . GERD (gastroesophageal reflux disease)   . Hyperlipidemia   . Hypertension   . Substance abuse in remission ([place]) 04/06/2018      Review of Systems  All other systems reviewed and are negative.      Objective:   Physical Exam  Constitutional: He is oriented to person, place, and time. He appears well-developed and well-nourished.  HENT:  Head: Normocephalic and atraumatic.  Cardiovascular: Normal rate and regular rhythm.  Pulmonary/Chest: Effort normal and breath sounds normal.  Neurological: He is alert and oriented to person, place, and time.  Psychiatric: He has a normal mood and affect. His behavior is normal.          Assessment & Plan:  [name] KitchenMarland KitchenDiagnoses and all orders for this visit:  OSA (obstructive sleep apnea)   Patient has been compliant more than 70% of the time with a 96.7% usage rate of greater than 4 hours nightly.  He also admits that his symptoms of fatigue have improved.  I feel like it is very prudent for him to continue on the CPAP machine and request that he be given a large mass to see if there is even greater improvement in his symptoms.

## 2018-04-14 ENCOUNTER — Encounter: Payer: Medicare HMO | Admitting: Physical Therapy

## 2018-04-15 ENCOUNTER — Ambulatory Visit: Payer: Medicare HMO | Admitting: Physical Therapy

## 2018-04-15 DIAGNOSIS — M62830 Muscle spasm of back: Secondary | ICD-10-CM

## 2018-04-15 DIAGNOSIS — M545 Low back pain, unspecified: Secondary | ICD-10-CM

## 2018-04-15 DIAGNOSIS — G8929 Other chronic pain: Secondary | ICD-10-CM | POA: Diagnosis not present

## 2018-04-15 NOTE — Therapy (Signed)
[place], [place], [address] Phone: [phone]   Fax:  [phone]  Physical Therapy Treatment  Patient Details  Name: Jake [name] MRN: [id number] Date of Birth: 17-Nov-1960 Referring Provider: Georgina Snell   Encounter Date: 04/15/2018  PT End of Session - 04/15/18 1530    Visit Number  4    Number of Visits  12    Date for PT Re-Evaluation  04/27/18    PT Start Time  0200    PT Stop Time  0255    PT Time Calculation (min)  55 min    Activity Tolerance  Patient tolerated treatment well    Behavior During Therapy  [facility] for tasks assessed/performed       Past Medical History:  Diagnosis Date  . Bipolar disorder ([place])   . GERD (gastroesophageal reflux disease)   . Hyperlipidemia   . Hypertension   . Substance abuse in remission ([place]) 04/06/2018   AVOID OPIATES!    Past Surgical History:  Procedure Laterality Date  . ANKLE FRACTURE SURGERY Left   . APPENDECTOMY    . CERVICAL FUSION    . CHOLECYSTECTOMY    . COLON SURGERY    . HERNIA REPAIR      There were no vitals filed for this visit.  Subjective Assessment - 04/15/18 1542    Subjective  Pt relays he had nerve ablation last wednesday with relief initially but now the pain is coming back and he is having back spasms bilat                       [facility] Adult PT Treatment/Exercise - 04/15/18 1528      Neuro Re-ed    Neuro Re-ed Details   core stregthening      Exercises   Exercises  Lumbar      Lumbar Exercises: Stretches   Passive Hamstring Stretch  2 reps;30 seconds;Right;Left    Single Knee to Chest Stretch  2 reps;30 seconds;Right;Left    Lower Trunk Rotation  --   5 sec X 10 bilat   Prone on Elbows Stretch  3 reps;30 seconds    Piriformis Stretch  Right;Left;2 reps;30 seconds    Other Lumbar Stretch Exercise  child pose 30 sec X 2 fwd, Lt, Rt      Lumbar Exercises: Aerobic   Stationary Bike  8 min with MHP       Lumbar Exercises: Standing   Row  --    Theraband Level (Row)  --    Shoulder Extension  --    Theraband Level (Shoulder Extension)  --      Lumbar Exercises: Supine   Ab Set  --   3 part core 10 sec hold   Dead Bug  20 reps    Bridge  20 reps;5 reps    Other Supine Lumbar Exercises  --      Modalities   Modalities  Electrical Stimulation;Moist Heat      Moist Heat Therapy   Number Minutes Moist Heat  15 Minutes   with TENS post tx, 8 min with bike pre tx   Moist Heat Location  Lumbar Spine      Electrical Stimulation   Electrical Stimulation Location  lumbar bilat    Electrical Stimulation Action  TENS    Electrical Stimulation Parameters  tolerance    Electrical Stimulation Goals  Tone;Pain      Manual Therapy   Soft tissue  mobilization  STM/IASTM to bilat lumbar P.S    Myofascial Release  lumbar                  PT Long Term Goals - 03/29/18 1010      PT LONG TERM GOAL #1   Title  I with advanced HEP 6 weeks (04/27/18)    Status  On-going      PT LONG TERM GOAL #2   Title  report =/> 75% reduction of LBP to allow him to return to work. 6 weeks (04/27/18)    Status  On-going      PT LONG TERM GOAL #3   Title  demo lumbar ROM WFL and minimal to no pain 6 weeks (04/27/18)    Status  On-going      PT LONG TERM GOAL #4   Title  improve FOTO =/< 45% limited. 6 weeks (04/27/18)     Status  On-going            Plan - 04/15/18 1612    Clinical Impression Statement  Pt had increased tightness and spasm in lumbar P.S. today. [place] focused on stretching and STM to reduce Soft tissue restrictions along with Moist heat and TENS to further relax muscles and decrease pain.     PT Frequency  2x / week    PT Duration  6 weeks    PT Treatment/Interventions  Dry needling;Manual techniques;Moist Heat;Traction;Ultrasound;Patient/family education;Taping;Therapeutic exercise;Cryotherapy;Electrical Stimulation    PT Next Visit Plan  stretching, core, STM, may  try [place]       Patient will benefit from skilled therapeutic intervention in order to improve the following deficits and impairments:  Pain, Increased muscle spasms, Decreased range of motion, Obesity, Decreased activity tolerance, Decreased strength  Visit Diagnosis: Chronic bilateral low back pain without sciatica  Muscle spasm of back     Problem List Patient Active Problem List   Diagnosis Date Noted  . Substance abuse in remission ([place]) 04/07/2018  . Former smoker 07/25/2017  . Cough 07/25/2017  . 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 Non-restorative sleep 07/25/2017  . Benign prostatic hyperplasia (BPH) with urinary urgency 06/12/2017  . No energy 06/12/2017  . Elevated fasting glucose 06/10/2017  . Low testosterone 06/10/2017  . 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 History of pancreatitis 05/27/2017  . Diverticulitis 05/27/2017  . Atelectasis of both lungs 11/20/2016  . Hyperlipidemia 05/19/2016  . Hypertriglyceridemia 05/19/2016  . Diverticulosis of colon without hemorrhage 05/19/2016  . IBS (irritable bowel syndrome) 05/19/2016  . Essential hypertension, benign 05/19/2016  . GERD (gastroesophageal reflux disease) 05/19/2016  . Allergic rhinitis 05/19/2016  . OSA on CPAP 05/19/2016  . Generalized anxiety disorder 05/19/2016  . Mood changes 05/19/2016  . Low back pain 05/05/2016  . Muscle spasm of back 05/05/2016  . Anxiety 10/30/2015  . Chronic nausea 10/30/2015  . Major depressive disorder 10/30/2015  . Migraine 10/30/2015  . Neck pain 10/30/2015  . Occipital neuralgia 10/30/2015    Debbe Odea, PT, DPT 04/15/2018, 4:15 PM  [facility] [place], [place], [address] Phone: [phone]   Fax:  [phone]  Name: Devlon Dosher MRN: [id number] Date of Birth: 1961/07/26

## 2018-04-18 ENCOUNTER — Ambulatory Visit: Payer: Medicare HMO | Admitting: Family Medicine

## 2018-04-22 DIAGNOSIS — G4733 Obstructive sleep apnea (adult) (pediatric): Secondary | ICD-10-CM | POA: Diagnosis not present

## 2018-04-28 ENCOUNTER — Ambulatory Visit: Payer: Medicare HMO | Admitting: Physical Therapy

## 2018-04-28 DIAGNOSIS — M62830 Muscle spasm of back: Secondary | ICD-10-CM | POA: Diagnosis not present

## 2018-04-28 DIAGNOSIS — M545 Low back pain, unspecified: Secondary | ICD-10-CM

## 2018-04-28 DIAGNOSIS — G8929 Other chronic pain: Secondary | ICD-10-CM | POA: Diagnosis not present

## 2018-04-28 NOTE — Therapy (Signed)
[facility] [place], [place], [address] Phone: [phone]   Fax:  [phone]  Physical Therapy Treatment/Discharge  Patient Details  Name: Jake [name] MRN: [id number] Date of Birth: Oct 07, 1960 Referring Provider: Tommi Rumps   Encounter Date: 04/28/2018  PT End of Session - 04/28/18 8889    Visit Number  5    Number of Visits  12    Date for PT Re-Evaluation  05/26/18    PT Start Time  1694    PT Stop Time  1230    PT Time Calculation (min)  45 min    Activity Tolerance  Patient tolerated treatment well       Past Medical History:  Diagnosis Date  . Bipolar disorder ([place])   . GERD (gastroesophageal reflux disease)   . Hyperlipidemia   . Hypertension   . Substance abuse in remission ([place]) 04/06/2018   AVOID OPIATES!    Past Surgical History:  Procedure Laterality Date  . ANKLE FRACTURE SURGERY Left   . APPENDECTOMY    . CERVICAL FUSION    . CHOLECYSTECTOMY    . COLON SURGERY    . HERNIA REPAIR      There were no vitals filed for this visit.  Subjective Assessment - 04/28/18 1149    Subjective  Pt reayas his back spams have calmed down some and his back is feeling better, he feels ready for DC.    Currently in Pain?  Yes    Pain Score  3     Pain Location  Back         [facility] PT Assessment - 04/28/18 0001      Assessment   Medical Diagnosis  Low back pain    Referring Provider  [facility]      Observation/Other Assessments   Focus on Therapeutic Outcomes (FOTO)   27% limit      ROM / Strength   AROM / PROM / Strength  AROM;Strength      AROM   AROM Assessment Site  Lumbar    Lumbar Flexion  WFL    Lumbar Extension  WFL    Lumbar - Right Side Bend  [facility]    Lumbar - Left Side Bend  WFL    Lumbar - Right Rotation  [facility]    Lumbar - Left Rotation  [facility]      Strength   Overall Strength  Within functional limits for tasks performed                   [facility] Adult PT  Treatment/Exercise - 04/28/18 1150      Neuro Re-ed    Neuro Re-ed Details   core stregthening      Exercises   Exercises  Lumbar      Lumbar Exercises: Stretches   Passive Hamstring Stretch  2 reps;30 seconds;Right;Left    Single Knee to Chest Stretch  2 reps;30 seconds;Right;Left    Lower Trunk Rotation  --   5 sec X 10 bilat   Prone on Elbows Stretch  3 reps;30 seconds    Piriformis Stretch  Right;Left;2 reps;30 seconds    Other Lumbar Stretch Exercise  child pose 30 sec X 2 fwd, Lt, Rt      Lumbar Exercises: Aerobic   Stationary Bike  6 min      Lumbar Exercises: Standing   Row  20 reps    Theraband Level (Row)  Level 3 (Green)    Shoulder Extension  20 reps    Theraband Level (Shoulder Extension)  Level 3 (Green)    Other Standing Lumbar Exercises  step ups 6 inch 15 bilat    Other Standing Lumbar Exercises  wall squats with Pball  x15      Lumbar Exercises: Supine   Ab Set  --   3 part core 10 sec hold   Clam  20 reps    Clam Limitations  Green    Dead Bug  --    Bridge  20 reps;5 reps      Modalities   Modalities  Electrical Stimulation;Moist Heat      Moist Heat Therapy   Number Minutes Moist Heat  15 Minutes    Moist Heat Location  Lumbar Spine      Electrical Stimulation   Electrical Stimulation Location  lumbar bilat    Electrical Stimulation Action  Tens    Electrical Stimulation Parameters  tolerance    Electrical Stimulation Goals  Tone;Pain      Manual Therapy   Soft tissue mobilization  --    Myofascial Release  --             PT Education - 04/28/18 1339    Education Details  Continue HEP and build activity tolerance for lifting and return to work.    Person(s) Educated  Patient    Methods  Explanation;Demonstration;Verbal cues    Comprehension  Verbalized understanding          PT Long Term Goals - 04/28/18 1221      PT LONG TERM GOAL #1   Title  I with advanced HEP 6 weeks (04/27/18)    Time  4    Period  Weeks    Status   Achieved      PT LONG TERM GOAL #2   Title  report =/> 75% reduction of LBP to allow him to return to work. 6 weeks (04/27/18)    Time  4    Period  Weeks    Status  Partially Met      PT LONG TERM GOAL #3   Title  demo lumbar ROM WFL and minimal to no pain 6 weeks (04/27/18)    Time  4    Period  Weeks    Status  Achieved      PT LONG TERM GOAL #4   Title  improve FOTO =/< 45% limited. 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 He has made good progress toward his goals and would continue to benefit from further PT but pt feels conficident he can continue to progress on his own with HEP and wishes to DC. He had no further questions or concerns.    Rehab Potential  Good    PT Frequency  --    PT Duration  --    PT Treatment/Interventions  Dry needling;Manual techniques;Moist Heat;Traction;Ultrasound;Patient/family education;Taping;Therapeutic exercise;Cryotherapy;Electrical Stimulation    PT Next Visit Plan  D/C today    Consulted and Agree with Plan of Care  Patient       Patient will benefit from skilled therapeutic intervention in order to improve the following deficits and impairments:  Pain, Increased muscle spasms, Decreased range of motion, Obesity, Decreased activity tolerance, Decreased strength  Visit Diagnosis: Chronic bilateral low back pain without sciatica  Muscle spasm of back     Problem List Patient Active Problem List   Diagnosis Date Noted  . Substance abuse in remission ([place]) 04/07/2018  . Former smoker 07/25/2017  . Cough 07/25/2017  . 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 Non-restorative  sleep 07/25/2017  . Benign prostatic hyperplasia (BPH) with urinary urgency 06/12/2017  . No energy 06/12/2017  . Elevated fasting glucose 06/10/2017  . Low testosterone 06/10/2017  . 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 History of pancreatitis 05/27/2017  . Diverticulitis 05/27/2017  . Atelectasis of both lungs 11/20/2016  . Hyperlipidemia 05/19/2016  . Hypertriglyceridemia 05/19/2016  . Diverticulosis of colon without hemorrhage 05/19/2016  . IBS (irritable bowel syndrome) 05/19/2016  . Essential hypertension, benign 05/19/2016  . GERD (gastroesophageal reflux disease) 05/19/2016  . Allergic rhinitis 05/19/2016  . OSA on CPAP 05/19/2016  . Generalized anxiety disorder 05/19/2016  . Mood changes 05/19/2016  . Low back pain 05/05/2016  . Muscle spasm of back 05/05/2016  . Anxiety 10/30/2015  . Chronic nausea 10/30/2015  . Major depressive disorder 10/30/2015  . Migraine 10/30/2015  . Neck pain 10/30/2015  . Occipital neuralgia 10/30/2015    Debbe Odea, PT, DPT 04/28/2018, 1:44 PM  [facility] [place], [place], [address] Phone: [phone]   Fax:  [phone]  Name: Jake [name] MRN: [id number] Date of Birth: Jul 26, 1961  PHYSICAL THERAPY DISCHARGE SUMMARY  Visits from Start of Care: 5 Current functional level related to goals / functional outcomes: 27% limit via FOTO, see above for objective findings   Remaining deficits: See above   Education / Equipment: Continue HEP Plan: Patient agrees to discharge.  Patient goals were partially met. Patient is being discharged due to being pleased with the current functional level.  ?????    Elsie Ra, PT, DPT 04/28/18 1:46 PM

## 2018-05-03 DIAGNOSIS — G4733 Obstructive sleep apnea (adult) (pediatric): Secondary | ICD-10-CM | POA: Diagnosis not present

## 2018-05-04 ENCOUNTER — Telehealth: Payer: Self-pay | Admitting: Family Medicine

## 2018-05-04 NOTE — Telephone Encounter (Signed)
Updated disability paperwork for [employer]

## 2018-05-09 ENCOUNTER — Other Ambulatory Visit: Payer: Self-pay | Admitting: Physician Assistant

## 2018-05-16 ENCOUNTER — Telehealth: Payer: Self-pay | Admitting: Physician Assistant

## 2018-05-16 NOTE — Telephone Encounter (Signed)
Pt states that he has been out of work since he seen Dr.corey and is better and would like to know if Dr.Corey will give him a return to work note. Please let pt know

## 2018-05-17 ENCOUNTER — Encounter: Payer: Self-pay | Admitting: Family Medicine

## 2018-05-17 NOTE — Telephone Encounter (Signed)
Pt advised. He also has a form to be signed from his employer, he will drop that off when he picks up the letter.

## 2018-05-17 NOTE — Telephone Encounter (Signed)
Letter written to return to work immediately to full duty with no restrictions.    If no restrictions no need for follow-up visit.  However If there are still restrictions that I need to address I probably should have a follow-up visit.    If the date needs to be different please let me know.

## 2018-05-19 ENCOUNTER — Telehealth: Payer: Self-pay | Admitting: Family Medicine

## 2018-05-19 NOTE — Telephone Encounter (Signed)
Pt advised. Will come get form

## 2018-05-19 NOTE — Telephone Encounter (Signed)
Return to work form completed and ready for pickup.

## 2018-05-23 DIAGNOSIS — G4733 Obstructive sleep apnea (adult) (pediatric): Secondary | ICD-10-CM | POA: Diagnosis not present

## 2018-05-27 ENCOUNTER — Telehealth: Payer: Self-pay | Admitting: Family Medicine

## 2018-05-27 ENCOUNTER — Encounter: Payer: Self-pay | Admitting: Family Medicine

## 2018-05-27 NOTE — Telephone Encounter (Signed)
Called and left a message about return to work date and details for [employer] form.  I will try calling back later.

## 2018-06-10 ENCOUNTER — Ambulatory Visit (INDEPENDENT_AMBULATORY_CARE_PROVIDER_SITE_OTHER): Payer: Medicare HMO | Admitting: Physician Assistant

## 2018-06-10 ENCOUNTER — Encounter: Payer: Self-pay | Admitting: Physician Assistant

## 2018-06-10 VITALS — BP 125/84 | HR 90 | Temp 97.5°F | Wt 243.0 lb

## 2018-06-10 DIAGNOSIS — J302 Other seasonal allergic rhinitis: Secondary | ICD-10-CM | POA: Diagnosis not present

## 2018-06-10 DIAGNOSIS — J309 Allergic rhinitis, unspecified: Secondary | ICD-10-CM

## 2018-06-10 MED ORDER — METHYLPREDNISOLONE SODIUM SUCC 125 MG IJ SOLR
125.0000 mg | Freq: Once | INTRAMUSCULAR | Status: AC
  Administered 2018-06-10: 125 mg via INTRAMUSCULAR

## 2018-06-10 NOTE — Progress Notes (Signed)
Subjective:     Patient ID: Jake [name], male   DOB: 09/05/1961, 57 y.o.   MRN: [id number]  HPI Patient is a 57 yo male who presents today complaining of allergies. He states that he typically gets seasonal allergies and this feels the same. His symptoms started 3-4 days ago. He complains of sinus pressure, congestion, and rhinorrhea. He complains of postnasal drip, but denies a sore throat. He complains of a cough that is sometimes productive and sometimes not. When it is productive the mucous is white or clear. He states sometimes he has a hard time getting a deep breath. He states that his eyes are watery and he wakes up with some crust in the corners, but denies any discharge. He denies chest pain. He has been taking Tylenol Sinus and Mucinex Cold and Flu but neither have helped. He also uses a nasal spray. He has gotten a shot for allergies before and states that it helped.     .. Active Ambulatory Problems    Diagnosis Date Noted  . Low back pain 05/05/2016  . Muscle spasm of back 05/05/2016  . Hyperlipidemia 05/19/2016  . Hypertriglyceridemia 05/19/2016  . Diverticulosis of colon without hemorrhage 05/19/2016  . IBS (irritable bowel syndrome) 05/19/2016  . Essential hypertension, benign 05/19/2016  . GERD (gastroesophageal reflux disease) 05/19/2016  . Allergic rhinitis 05/19/2016  . OSA on CPAP 05/19/2016  . Generalized anxiety disorder 05/19/2016  . Mood changes 05/19/2016  . Atelectasis of both lungs 11/20/2016  . 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 History of pancreatitis 05/27/2017  . Diverticulitis 05/27/2017  . Elevated fasting glucose 06/10/2017  . Low testosterone 06/10/2017  . Benign prostatic hyperplasia (BPH) with urinary urgency 06/12/2017  . No energy 06/12/2017  . Former smoker 07/25/2017  . Cough 07/25/2017  . 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 Non-restorative sleep 07/25/2017  . Anxiety 10/30/2015  . Chronic nausea 10/30/2015  . Major depressive  disorder 10/30/2015  . Migraine 10/30/2015  . Neck pain 10/30/2015  . Occipital neuralgia 10/30/2015  . Substance abuse in remission ([place]) 04/07/2018   Resolved Ambulatory Problems    Diagnosis Date Noted  . Community acquired pneumonia of left lower lobe of lung ([place]) 11/20/2016   Past Medical History:  Diagnosis Date  . Bipolar disorder ([place])   . Hypertension      Review of Systems  HENT: Positive for congestion, postnasal drip, rhinorrhea and sinus pressure. Negative for ear pain and sore throat.   Eyes: Positive for discharge.  Respiratory: Positive for cough and shortness of breath. Negative for wheezing.   Cardiovascular: Negative for chest pain.       Objective:   Physical Exam  Constitutional: He is oriented to person, place, and time. He appears well-developed and well-nourished.  HENT:  Head: Normocephalic and atraumatic.  Right Ear: Tympanic membrane, external ear and ear canal normal.  Left Ear: Tympanic membrane, external ear and ear canal normal.  Nose: Rhinorrhea present.  Mouth/Throat: Oropharynx is clear and moist.  Cardiovascular: Normal rate, regular rhythm and normal heart sounds.  Pulmonary/Chest: Effort normal and breath sounds normal.  Neurological: He is alert and oriented to person, place, and time.  Psychiatric: He has a normal mood and affect. His behavior is normal.       Assessment:     [name] KitchenMarland KitchenZen was seen today for sinusitis.  Diagnoses and all orders for this visit:  Allergic sinusitis -     methylPREDNISolone sodium succinate (SOLU-MEDROL) 125 mg/2 mL injection  125 mg  Seasonal allergic rhinitis, unspecified trigger       Plan:     Solumedrol given in office today for allegies. Patient encouraged to continue use of flonase nasal spray to help improve symptoms. Stay on claritin. Encouraged sinus rinses. Call if symptoms worsen.   [name] KitchenVernetta Honey PA-C, have reviewed and agree with the above documentation in it's entirety.

## 2018-06-10 NOTE — Patient Instructions (Signed)
Allergies An allergy is when your body reacts to a substance in a way that is not normal. An allergic reaction can happen after you:  Eat something.  Breathe in something.  Touch something.  You can be allergic to:  Things that are only around during certain seasons, like molds and pollens.  Foods.  Drugs.  Insects.  Animal dander.  What are the signs or symptoms?  Puffiness (swelling). This may happen on the lips, face, tongue, mouth, or throat.  Sneezing.  Coughing.  Breathing loudly (wheezing).  Stuffy nose.  Tingling in the mouth.  A rash.  Itching.  Itchy, red, puffy areas of skin (hives).  Watery eyes.  Throwing up (vomiting).  Watery poop (diarrhea).  Dizziness.  Feeling faint or fainting.  Trouble breathing or swallowing.  A tight feeling in the chest.  A fast heartbeat. How is this diagnosed? Allergies can be diagnosed with:  A medical and family history.  Skin tests.  Blood tests.  A food diary. A food diary is a record of all the foods, drinks, and symptoms you have each day.  The results of an elimination diet. This diet involves making sure not to eat certain foods and then seeing what happens when you start eating them again.  How is this treated? There is no cure for allergies, but allergic reactions can be treated with medicine. Severe reactions usually need to be treated at a hospital. How is this prevented? The best way to prevent an allergic reaction is to avoid the thing you are allergic to. Allergy shots and medicines can also help prevent reactions in some cases. This information is not intended to replace advice given to you by your health care provider. Make sure you discuss any questions you have with your health care provider. Document Released: 12/19/2012 Document Revised: 04/20/2016 Document Reviewed: 06/05/2014 Elsevier Interactive Patient Education  2018 Elsevier Inc.  

## 2018-06-13 ENCOUNTER — Encounter: Payer: Self-pay | Admitting: Physician Assistant

## 2018-06-13 ENCOUNTER — Ambulatory Visit (INDEPENDENT_AMBULATORY_CARE_PROVIDER_SITE_OTHER): Payer: Medicare HMO | Admitting: Physician Assistant

## 2018-06-13 VITALS — BP 136/80 | HR 76 | Temp 98.6°F | Ht 71.0 in | Wt 242.0 lb

## 2018-06-13 DIAGNOSIS — J329 Chronic sinusitis, unspecified: Secondary | ICD-10-CM | POA: Diagnosis not present

## 2018-06-13 DIAGNOSIS — J40 Bronchitis, not specified as acute or chronic: Secondary | ICD-10-CM

## 2018-06-13 MED ORDER — AZITHROMYCIN 250 MG PO TABS
ORAL_TABLET | ORAL | 0 refills | Status: DC
Start: 2018-06-13 — End: 2018-08-22

## 2018-06-13 MED ORDER — AZITHROMYCIN 250 MG PO TABS: ORAL_TABLET | ORAL | 0 refills | Status: DC

## 2018-06-13 MED ORDER — ALBUTEROL SULFATE HFA 108 (90 BASE) MCG/ACT IN AERS: 2.0000 | INHALATION_SPRAY | Freq: Four times a day (QID) | RESPIRATORY_TRACT | 1 refills | Status: DC | PRN

## 2018-06-13 NOTE — Patient Instructions (Signed)

## 2018-06-13 NOTE — Progress Notes (Signed)
Subjective:     Patient ID: Jake [name], male   DOB: 1960-09-16, 57 y.o.   MRN: [id number]  HPI This is a 57 yo patient with a history of allergic rhinitis and atelectasis who presents today complaining of chest congestion and cough. Patient was seen in the office last Friday (06/10/18) for allergies for which he was given a Solumedrol injection. He states his symptoms have not improved and his cough has worsened. He says he cant sleep lying down because of the cough and he sleeps while sitting up in a chair, which he has done so for the past 3 nights. He complains of inability to get a deep breath and feels like there is a pressure in his chest. He states he has had some wheezing and SOB that gets worse on exertion. He used to have an inhaler, but does not have one anymore. He is unable to get any of the mucus out when he coughs. He denies chest pain, but does say he feels like he pulled a muscle from coughing. He has been blowing his nose and states the mucus is bloody and gray. He complains of sinus pressure and congestion that has not improved since Friday and a headache. He denies a fever or chills.   .. Active Ambulatory Problems    Diagnosis Date Noted  . Low back pain 05/05/2016  . Muscle spasm of back 05/05/2016  . Hyperlipidemia 05/19/2016  . Hypertriglyceridemia 05/19/2016  . Diverticulosis of colon without hemorrhage 05/19/2016  . IBS (irritable bowel syndrome) 05/19/2016  . Essential hypertension, benign 05/19/2016  . GERD (gastroesophageal reflux disease) 05/19/2016  . Allergic rhinitis 05/19/2016  . OSA on CPAP 05/19/2016  . Generalized anxiety disorder 05/19/2016  . Mood changes 05/19/2016  . Atelectasis of both lungs 11/20/2016  . 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 History of pancreatitis 05/27/2017  . Diverticulitis 05/27/2017  . Elevated fasting glucose 06/10/2017  . Low testosterone 06/10/2017  . Benign prostatic hyperplasia (BPH) with urinary urgency 06/12/2017   . No energy 06/12/2017  . Former smoker 07/25/2017  . Cough 07/25/2017  . 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 Non-restorative sleep 07/25/2017  . Anxiety 10/30/2015  . Chronic nausea 10/30/2015  . Major depressive disorder 10/30/2015  . Migraine 10/30/2015  . Neck pain 10/30/2015  . Occipital neuralgia 10/30/2015  . Substance abuse in remission ([place]) 04/07/2018   Resolved Ambulatory Problems    Diagnosis Date Noted  . Community acquired pneumonia of left lower lobe of lung ([place]) 11/20/2016   Past Medical History:  Diagnosis Date  . Bipolar disorder ([place])   . Hypertension      Review of Systems  Constitutional: Negative for chills and fever.  HENT: Positive for congestion and sinus pressure.   Respiratory: Positive for cough, chest tightness, shortness of breath and wheezing.   Cardiovascular: Negative for chest pain.  Neurological: Positive for headaches.       Objective:   Physical Exam  Constitutional: He is oriented to person, place, and time. He appears well-developed and well-nourished.  HENT:  Head: Normocephalic and atraumatic.  Right Ear: External ear normal.  Left Ear: External ear normal.  Nose: Nose normal.  Mouth/Throat: Oropharynx is clear and moist.  Cardiovascular: Normal rate, regular rhythm and normal heart sounds.  Pulmonary/Chest: He has rhonchi in the right lower field and the left lower field.  Neurological: He is alert and oriented to person, place, and time.  Skin: Skin is warm and dry.  Psychiatric: He  has a normal mood and affect.       Assessment:     [name] KitchenMarland KitchenDiagnoses and all orders for this visit:  Sinobronchitis -     azithromycin (ZITHROMAX) 250 MG tablet; Take 2 tablets now and then one tablet for 4 days. -     albuterol (PROVENTIL HFA;VENTOLIN HFA) 108 (90 Base) MCG/ACT inhaler; Inhale 2 puffs into the lungs every 6 (six) hours as needed for wheezing.  Other orders -     Discontinue: albuterol (PROVENTIL HFA;VENTOLIN HFA) 108  (90 Base) MCG/ACT inhaler; Inhale 2 puffs into the lungs every 6 (six) hours as needed for wheezing. -     Discontinue: azithromycin (ZITHROMAX) 250 MG tablet; Take 2 tablets now and then one tablet for 4 days.       Plan:     Patient given Duoneb treatment in office today. Lungs sounded like they cleared some with neb.  Prescribed azithromycin for likely sinobronchitis as well as an albuterol inhaler for patient to use as needed for shortness of breath. Bloody dry mucus likely due to use of Flonase. Delsym for cough. Encouraged cough drops.  Patient should call if symptoms do not improve or worsen within the next 2-3 days.   [name] KitchenVernetta Honey PA-C, have reviewed and agree with the above documentation in it's entirety.

## 2018-06-15 ENCOUNTER — Other Ambulatory Visit: Payer: Self-pay | Admitting: Family Medicine

## 2018-06-22 DIAGNOSIS — G4733 Obstructive sleep apnea (adult) (pediatric): Secondary | ICD-10-CM | POA: Diagnosis not present

## 2018-06-23 ENCOUNTER — Ambulatory Visit: Payer: Self-pay | Admitting: Psychiatry

## 2018-06-24 ENCOUNTER — Ambulatory Visit: Payer: Self-pay | Admitting: Psychiatry

## 2018-07-04 ENCOUNTER — Encounter: Payer: Self-pay | Admitting: Emergency Medicine

## 2018-07-04 DIAGNOSIS — F319 Bipolar disorder, unspecified: Secondary | ICD-10-CM

## 2018-07-15 ENCOUNTER — Ambulatory Visit: Payer: Self-pay | Admitting: Psychiatry

## 2018-07-23 DIAGNOSIS — G4733 Obstructive sleep apnea (adult) (pediatric): Secondary | ICD-10-CM | POA: Diagnosis not present

## 2018-07-29 ENCOUNTER — Ambulatory Visit: Payer: Medicare HMO | Admitting: Psychiatry

## 2018-07-29 DIAGNOSIS — R69 Illness, unspecified: Secondary | ICD-10-CM | POA: Diagnosis not present

## 2018-07-29 DIAGNOSIS — F411 Generalized anxiety disorder: Secondary | ICD-10-CM

## 2018-07-29 DIAGNOSIS — F319 Bipolar disorder, unspecified: Secondary | ICD-10-CM | POA: Diagnosis not present

## 2018-07-29 MED ORDER — OLANZAPINE 10 MG PO TABS: 10.0000 mg | ORAL_TABLET | Freq: Every day | ORAL | 4 refills | Status: DC

## 2018-07-29 MED ORDER — SERTRALINE HCL 100 MG PO TABS: 100.0000 mg | ORAL_TABLET | Freq: Every day | ORAL | 4 refills | Status: DC

## 2018-07-29 NOTE — Progress Notes (Signed)
[facility]  Patient ID: Jake [name],  MRN: [id number]  PCP: Donella Stade, PA-C  Date of Evaluation: 07/29/2018 Time spent:20 minutes  Chief Complaint:   HISTORY/CURRENT STATUS: HPI patient seen 01/23/2018.  Diagnosis of bipolar disorder and anxiety.  He is done well.  Does complain of a low-grade depression.  Individual Medical History/ Review of Systems: Changes? :No   Allergies: Fentanyl; Other; Aspirin; Contrast media [iodinated diagnostic agents]; and Nitroglycerin  Current Medications:  Current Outpatient Medications:  .  albuterol (PROVENTIL HFA;VENTOLIN HFA) 108 (90 Base) MCG/ACT inhaler, Inhale 2 puffs into the lungs every 6 (six) hours as needed for wheezing., Disp: 2 Inhaler, Rfl: 1 .  AMBULATORY NON FORMULARY MEDICATION, Continuous positive airway pressure (CPAP) machine set at autotitration of H2O pressure, with all supplemental supplies as needed., Disp: 1 each, Rfl: 0 .  azithromycin (ZITHROMAX) 250 MG tablet, Take 2 tablets now and then one tablet for 4 days., Disp: 6 tablet, Rfl: 0 .  colestipol (COLESTID) 1 g tablet, Take 1 tablet (1 g total) by mouth daily., Disp: 90 tablet, Rfl: 4 .  dicyclomine (BENTYL) 20 MG tablet, TAKE 1 TABLET BY MOUTH THREE TIMES DAILY -  MUST  SCHEDULE  FOLLOW  UP  APPOINTMENT, Disp: 90 tablet, Rfl: 5 .  ipratropium (ATROVENT) 0.06 % nasal spray, Place 2 sprays into both nostrils 4 (four) times daily., Disp: 15 mL, Rfl: 1 .  loratadine (CLARITIN) 10 MG tablet, Take 10 mg by mouth daily., Disp: , Rfl:  .  OLANZapine (ZYPREXA) 10 MG tablet, Take 1 tablet (10 mg total) by mouth at bedtime., Disp: 30 tablet, Rfl: 4 .  ondansetron (ZOFRAN) 8 MG tablet, Take by mouth as needed for nausea or vomiting., Disp: , Rfl:  .  pantoprazole (PROTONIX) 40 MG tablet, TAKE 1 TABLET BY MOUTH TWICE DAILY, Disp: 180 tablet, Rfl: 1 .  sertraline (ZOLOFT) 100 MG tablet, Take 1 tablet (100 mg total) by mouth daily., Disp: 30 tablet, Rfl: 4 .   sodium bicarbonate 650 MG tablet, TAKE 1 TABLET BY MOUTH 4 TIMES DAILY, Disp: 360 tablet, Rfl: 4 .  tamsulosin (FLOMAX) 0.4 MG CAPS capsule, TAKE 1 CAPSULE BY MOUTH ONCE DAILY, Disp: 90 capsule, Rfl: 1 Medication Side Effects: none  Family Medical/ Social History: Changes? No  MENTAL HEALTH EXAM:  There were no vitals taken for this visit.There is no height or weight on file to calculate BMI.  General Appearance: Neat  Eye Contact:  Good  Speech:  Normal Rate  Volume:  Normal  Mood:  Euthymic  Affect:  Appropriate  Thought Process:  Linear  Orientation:  Full (Time, Place, and Person)  Thought Content: Logical   Suicidal Thoughts:  No  Homicidal Thoughts:  No  Memory:  WNL  Judgement:  Good  Insight:  Good  Psychomotor Activity:  Normal  Concentration:  Concentration: Good  Recall:  Good  Fund of Knowledge: Good  Language: Good  Assets:  Social Support  ADL's:  Intact  Cognition: WNL  Prognosis:  Good    DIAGNOSES:    ICD-10-CM   1. Bipolar affective disorder, remission status unspecified ([place]) F31.9   2. Generalized anxiety disorder F41.1     Receiving Psychotherapy: No    RECOMMENDATIONS: Patient is doing well.  He is aware that Zyprexa can increase his cholesterol and glucose but he wants to stay on Zyprexa.  See him again in 4 months.   Comer Locket, PA-C

## 2018-08-15 NOTE — Progress Notes (Deleted)
Subjective:   Jake [name] is a 57 y.o. male who presents for Medicare Annual/Subsequent preventive examination.  Review of Systems:  No ROS.  Medicare Wellness Visit. Additional risk factors are reflected in the social history.    Sleep patterns:    Home Safety/Smoke Alarms: Feels safe in home. Smoke alarms in place.  Living environment;  Seat Belt Safety/Bike Helmet: Wears seat belt.    Male:   CCS-     PSA- No results found for: PSA       Objective:    Vitals: There were no vitals taken for this visit.  There is no height or weight on file to calculate BMI.  Advanced Directives 03/16/2018 01/18/2018 06/26/2016 05/04/2016  Does Patient Have a Medical Advance Directive? No No No No  Would patient like information on creating a medical advance directive? No - Patient declined No - Patient declined No - patient declined information No - patient declined information    Tobacco Social History   Tobacco Use  Smoking Status Former Smoker  Smokeless Tobacco Never Used     Counseling given: Not Answered   Clinical Intake:                       Past Medical History:  Diagnosis Date  . Bipolar disorder ([place])   . GERD (gastroesophageal reflux disease)   . Hyperlipidemia   . Hypertension   . 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   Past Surgical History:  Procedure Laterality Date  . ANKLE FRACTURE SURGERY Left   . APPENDECTOMY    . CERVICAL FUSION    . CHOLECYSTECTOMY    . COLON SURGERY    . HERNIA REPAIR     Family History  Problem Relation Age of Onset  . Cancer Mother        breast  . Depression Mother   . Hyperlipidemia Mother   . Alcohol abuse Father   . Cancer Father        skin  . Hyperlipidemia Father   . Alzheimer's disease Father   . Cancer Maternal Grandfather   . Cancer Paternal Grandmother    Social History   Socioeconomic History  . Marital status: Married    Spouse name: Not on file  . Number  of children: Not on file  . Years of education: Not on file  . Highest education level: Not on file  Occupational History  . Not on file  Social Needs  . Financial resource strain: Not on file  . Food insecurity:    Worry: Not on file    Inability: Not on file  . Transportation needs:    Medical: Not on file    Non-medical: Not on file  Tobacco Use  . Smoking status: Former Research scientist (life sciences)  . Smokeless tobacco: Never Used  Substance and Sexual Activity  . Alcohol use: No  . Drug use: No    Comment: 04/06/2018 Patient's wife wanted [place] to be very aware that her husband is a recovering narcotic abuser  . Sexual activity: Yes  Lifestyle  . Physical activity:    Days per week: Not on file    Minutes per session: Not on file  . Stress: Not on file  Relationships  . Social connections:    Talks on phone: Not on file    Gets together: Not on file    Attends religious service: Not on file    Active member of club  or organization: Not on file    Attends meetings of clubs or organizations: Not on file    Relationship status: Not on file  Other Topics Concern  . Not on file  Social History Narrative  . Not on file    Outpatient Encounter Medications as of 08/22/2018  Medication Sig  . albuterol (PROVENTIL HFA;VENTOLIN HFA) 108 (90 Base) MCG/ACT inhaler Inhale 2 puffs into the lungs every 6 (six) hours as needed for wheezing.  . AMBULATORY NON FORMULARY MEDICATION Continuous positive airway pressure (CPAP) machine set at autotitration of H2O pressure, with all supplemental supplies as needed.  [name] Kitchen azithromycin (ZITHROMAX) 250 MG tablet Take 2 tablets now and then one tablet for 4 days.  . colestipol (COLESTID) 1 g tablet Take 1 tablet (1 g total) by mouth daily.  [name] Kitchen dicyclomine (BENTYL) 20 MG tablet TAKE 1 TABLET BY MOUTH THREE TIMES DAILY -  MUST  SCHEDULE  FOLLOW  UP  APPOINTMENT  . ipratropium (ATROVENT) 0.06 % nasal spray Place 2 sprays into both nostrils 4 (four) times daily.  [name] Kitchen loratadine  (CLARITIN) 10 MG tablet Take 10 mg by mouth daily.  [name] Kitchen OLANZapine (ZYPREXA) 10 MG tablet Take 1 tablet (10 mg total) by mouth at bedtime.  . ondansetron (ZOFRAN) 8 MG tablet Take by mouth as needed for nausea or vomiting.  . pantoprazole (PROTONIX) 40 MG tablet TAKE 1 TABLET BY MOUTH TWICE DAILY  . sertraline (ZOLOFT) 100 MG tablet Take 1 tablet (100 mg total) by mouth daily.  . sodium bicarbonate 650 MG tablet TAKE 1 TABLET BY MOUTH 4 TIMES DAILY  . tamsulosin (FLOMAX) 0.4 MG CAPS capsule TAKE 1 CAPSULE BY MOUTH ONCE DAILY   No facility-administered encounter medications on file as of 08/22/2018.     Activities of Daily Living No flowsheet data found.  Patient Care Team: Lavada Mesi as PCP - General (Family Medicine) Karie Chimera, PA-C as Referring Physician (Pulmonary Disease)   Assessment:   This is a routine wellness examination for [name].Physical assessment deferred to PCP.   Exercise Activities and Dietary recommendations   Diet  Breakfast: Lunch:  Dinner:       Goals   None     Fall Risk Fall Risk  02/16/2018  Falls in the past year? No   Is the patient's home free of loose throw rugs in walkways, pet beds, electrical cords, etc?   {Blank single:19197::"yes","no"}      Grab bars in the bathroom? {Blank single:19197::"yes","no"}      Handrails on the stairs?   {Blank single:19197::"yes","no"}      Adequate lighting?   {Blank single:19197::"yes","no"}   Depression Screen PHQ 2/9 Scores 02/16/2018 06/09/2017  PHQ - 2 Score 0 2  PHQ- 9 Score - 6    Cognitive Function        Immunization History  Administered Date(s) Administered  . Influenza,inj,Quad PF,6+ Mos 07/21/2017  . Influenza-Unspecified 07/16/2014, 06/14/2016  . Td 04/15/2015  . Tdap 10/10/2009    Screening Tests Health Maintenance  Topic Date Due  . Hepatitis C Screening  1960/09/19  . HIV Screening  01/06/1976  . COLONOSCOPY  01/06/2011  . INFLUENZA VACCINE  04/07/2018  .  TETANUS/TDAP  04/14/2025        Plan:   ***  I have personally reviewed and noted the following in the patient's chart:   . Medical and social history . Use of alcohol, tobacco or illicit drugs  . Current medications and supplements . Functional ability and  status . Nutritional status . Physical activity . Advanced directives . List of other physicians . Hospitalizations, surgeries, and ER visits in previous 12 months . Vitals . Screenings to include cognitive, depression, and falls . Referrals and appointments  In addition, I have reviewed and discussed with patient certain preventive protocols, quality metrics, and best practice recommendations. A written personalized care plan for preventive services as well as general preventive health recommendations were provided to patient.     Joanne Chars, LPN  [id number]

## 2018-08-22 ENCOUNTER — Ambulatory Visit (INDEPENDENT_AMBULATORY_CARE_PROVIDER_SITE_OTHER): Payer: Medicare HMO | Admitting: *Deleted

## 2018-08-22 ENCOUNTER — Ambulatory Visit: Payer: Medicare HMO

## 2018-08-22 VITALS — BP 138/72 | HR 78 | Ht 71.0 in | Wt 246.0 lb

## 2018-08-22 DIAGNOSIS — Z1159 Encounter for screening for other viral diseases: Secondary | ICD-10-CM | POA: Diagnosis not present

## 2018-08-22 DIAGNOSIS — G4733 Obstructive sleep apnea (adult) (pediatric): Secondary | ICD-10-CM | POA: Diagnosis not present

## 2018-08-22 DIAGNOSIS — R351 Nocturia: Secondary | ICD-10-CM

## 2018-08-22 DIAGNOSIS — Z0000 Encounter for general adult medical examination without abnormal findings: Secondary | ICD-10-CM

## 2018-08-22 DIAGNOSIS — Z23 Encounter for immunization: Secondary | ICD-10-CM | POA: Diagnosis not present

## 2018-08-22 NOTE — Patient Instructions (Addendum)
Jake [name] , Thank you for taking time to come for your Medicare Wellness Visit. I appreciate your ongoing commitment to your health goals. Please review the following plan we discussed and let me know if I can assist you in the future.   Please schedule your next medicare wellness visit with me in 1 yr. Continue doing brain stimulating activities (puzzles, reading, adult coloring books, staying active) to keep memory sharp.    These are the goals we discussed: Goals    . Exercise 3x per week (30 min per time)     Exercise at least 3 times a week for 30 minutes at a time. Would like to loose weight in the coming year as well

## 2018-08-22 NOTE — Progress Notes (Signed)
Subjective:   Jake [name] is a 57 y.o. male who presents for Medicare Annual/Subsequent preventive examination.  Review of Systems:  No ROS.  Medicare Wellness Visit. Additional risk factors are reflected in the social history.  Cardiac Risk Factors include: advanced age (>8men, >63 women);male gender;sedentary lifestyle;obesity (BMI >30kg/m2) Sleep patterns: getting 6-8 hours of sleep a night . Feels rested when wakes up does where CPAP machine and it helps. Doesn't wake up during the night to go to the bathroom.    Home Safety/Smoke Alarms: Feels safe in home. Smoke alarms in place.  Living environment; Lives with wife in 1 story home. No steps. Shower is a step over tub and no grab bars in place. Seat Belt Safety/Bike Helmet: Wears seat belt.    Male:   CCS-  Discussed with pt. He will think about it and let us know   PSA- ordered      Objective:    Vitals: BP 138/72   Pulse 78   Ht 5\' 11"  (1.803 m)   Wt 246 lb (111.6 kg)   SpO2 96%   BMI 34.31 kg/m   Body mass index is 34.31 kg/m.  Advanced Directives 08/22/2018 03/16/2018 01/18/2018 06/26/2016 05/04/2016  Does Patient Have a Medical Advance Directive? No No No No No  Would patient like information on creating a medical advance directive? No - Patient declined No - Patient declined No - Patient declined No - patient declined information No - patient declined information    Tobacco Social History   Tobacco Use  Smoking Status Former Smoker  Smokeless Tobacco Never Used     Counseling given: Not Answered   Clinical Intake:  Pre-visit preparation completed: Yes  Pain : No/denies pain     Nutritional Risks: None Diabetes: No  How often do you need to have someone help you when you read instructions, pamphlets, or other written materials from your doctor or pharmacy?: 1 - Never What is the last grade level you completed in school?: 12  Interpreter Needed?: No  Information entered by :: Orlie Dakin,  LPN  Past Medical History:  Diagnosis Date  . Arthritis    lower back  . Bipolar disorder ([place])   . GERD (gastroesophageal reflux disease)   . Hyperlipidemia   . Hypertension   . 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   Past Surgical History:  Procedure Laterality Date  . ANKLE FRACTURE SURGERY Left   . APPENDECTOMY    . CERVICAL FUSION    . CHOLECYSTECTOMY    . COLON SURGERY    . HERNIA REPAIR     Family History  Problem Relation Age of Onset  . Cancer Mother        breast  . Depression Mother   . Hyperlipidemia Mother   . Alcohol abuse Father   . Cancer Father        skin  . Hyperlipidemia Father   . Alzheimer's disease Father   . Cancer Maternal Grandfather   . Cancer Paternal Grandmother    Social History   Socioeconomic History  . Marital status: Married    Spouse name: Dawn  . Number of children: 1  . Years of education: 78  . Highest education level: Not on file  Occupational History  . Occupation: part time    Comment: home [employer]  . Financial resource strain: Not hard at all  . Food insecurity:    Worry: Never true  Inability: Never true  . Transportation needs:    Medical: No    Non-medical: No  Tobacco Use  . Smoking status: Former Research scientist (life sciences)  . Smokeless tobacco: Never Used  Substance and Sexual Activity  . Alcohol use: No  . Drug use: No    Comment: 04/06/2018 Patient's wife wanted [place] to be very aware that her husband is a recovering narcotic abuser  . Sexual activity: Yes  Lifestyle  . Physical activity:    Days per week: 0 days    Minutes per session: 0 min  . Stress: Not at all  Relationships  . Social connections:    Talks on phone: Twice a week    Gets together: Once a week    Attends religious service: More than 4 times per year    Active member of club or organization: Yes    Attends meetings of clubs or organizations: More than 4 times per year    Relationship status: Married  Other  Topics Concern  . Not on file  Social History Narrative   Works part time for [employer] Does not exercise due to back issues in the past and strenous job. Daily caffeine use 2-3 drinks a day.    Outpatient Encounter Medications as of 08/22/2018  Medication Sig  . AMBULATORY NON FORMULARY MEDICATION Continuous positive airway pressure (CPAP) machine set at autotitration of H2O pressure, with all supplemental supplies as needed.  . colestipol (COLESTID) 1 g tablet Take 1 tablet (1 g total) by mouth daily.  [name] Kitchen dicyclomine (BENTYL) 20 MG tablet TAKE 1 TABLET BY MOUTH THREE TIMES DAILY -  MUST  SCHEDULE  FOLLOW  UP  APPOINTMENT  . ipratropium (ATROVENT) 0.06 % nasal spray Place 2 sprays into both nostrils 4 (four) times daily.  [name] Kitchen loratadine (CLARITIN) 10 MG tablet Take 10 mg by mouth daily.  [name] Kitchen OLANZapine (ZYPREXA) 10 MG tablet Take 1 tablet (10 mg total) by mouth at bedtime.  . ondansetron (ZOFRAN) 8 MG tablet Take by mouth as needed for nausea or vomiting.  . pantoprazole (PROTONIX) 40 MG tablet TAKE 1 TABLET BY MOUTH TWICE DAILY  . sertraline (ZOLOFT) 100 MG tablet Take 1 tablet (100 mg total) by mouth daily.  . sodium bicarbonate 650 MG tablet TAKE 1 TABLET BY MOUTH 4 TIMES DAILY  . tamsulosin (FLOMAX) 0.4 MG CAPS capsule TAKE 1 CAPSULE BY MOUTH ONCE DAILY  . albuterol (PROVENTIL HFA;VENTOLIN HFA) 108 (90 Base) MCG/ACT inhaler Inhale 2 puffs into the lungs every 6 (six) hours as needed for wheezing. (Patient not taking: Reported on 08/22/2018)  . [DISCONTINUED] azithromycin (ZITHROMAX) 250 MG tablet Take 2 tablets now and then one tablet for 4 days.   No facility-administered encounter medications on file as of 08/22/2018.     Activities of Daily Living In your present state of health, do you have any difficulty performing the following activities: 08/22/2018  Hearing? N  Vision? N  Difficulty concentrating or making decisions? N  Walking or climbing stairs? N  Dressing or bathing? N   Doing errands, shopping? N  Preparing Food and eating ? N  Using the Toilet? N  In the past six months, have you accidently leaked urine? N  Do you have problems with loss of bowel control? N  Managing your Medications? N  Managing your Finances? N  Housekeeping or managing your Housekeeping? N  Some recent data might be hidden    Patient Care Team: Lavada Mesi as PCP - General (Family  Medicine) Karie Chimera, PA-C as Referring Physician (Pulmonary Disease)   Assessment:   This is a routine wellness examination for [place]lee.Physical assessment deferred to PCP.   Exercise Activities and Dietary recommendations Current Exercise Habits: The patient does not participate in regular exercise at present, Exercise limited by: psychological condition(s) Diet eats a healthy diet, does eat yogurt and cheese for dairy Breakfast: cereal Lunch: sandwich Dinner: goes out to eat where vegetable and meat.   Does watch the carb intake due to turning to sugar. Drinks 2 bottles of water daily.  Goals    . Exercise 3x per week (30 min per time)     Exercise at least 3 times a week for 30 minutes at a time. Would like to loose weight in the coming year as well       Fall Risk Fall Risk  08/22/2018 02/16/2018  Falls in the past year? 0 No  Follow up Falls prevention discussed -   Is the patient's home free of loose throw rugs in walkways, pet beds, electrical cords, etc?   yes      Grab bars in the bathroom? no      Handrails on the stairs?   no      Adequate lighting?   yes    Depression Screen PHQ 2/9 Scores 08/22/2018 02/16/2018 06/09/2017  PHQ - 2 Score 1 0 2  PHQ- 9 Score - - 6    Cognitive Function     6CIT Screen 08/22/2018  What Year? 0 points  What month? 0 points  What time? 0 points  Count back from 20 0 points  Months in reverse 0 points  Repeat phrase 2 points  Total Score 2    Immunization History  Administered Date(s) Administered  .  Influenza,inj,Quad PF,6+ Mos 07/21/2017  . Influenza-Unspecified 07/16/2014, 06/14/2016  . Td 04/15/2015  . Tdap 10/10/2009    Screening Tests Health Maintenance  Topic Date Due  . Hepatitis C Screening  [birth year]  . HIV Screening  01/06/1976  . COLONOSCOPY  01/06/2011  . INFLUENZA VACCINE  04/07/2018  . TETANUS/TDAP  04/14/2025         Plan:    Mr. Paragas , Thank you for taking time to come for your Medicare Wellness Visit. I appreciate your ongoing commitment to your health goals. Please review the following plan we discussed and let me know if I can assist you in the future.   Please schedule your next medicare wellness visit with me in 1 yr. Continue doing brain stimulating activities (puzzles, reading, adult coloring books, staying active) to keep memory sharp.    These are the goals we discussed: Goals    . Exercise 3x per week (30 min per time)     Exercise at least 3 times a week for 30 minutes at a time. Would like to loose weight in the coming year as well       This is a list of the screening recommended for you and due dates:  Health Maintenance  Topic Date Due  .  Hepatitis C: One time screening is recommended by Center for Disease Control  (CDC) for  adults born from [birth year] through 1965.   1961/04/28  . HIV Screening  01/06/1976  . Colon Cancer Screening  01/06/2011  . Flu Shot  04/07/2018  . Tetanus Vaccine  04/14/2025     I have personally reviewed and noted the following in the patient's chart:   . Medical and social history .  Use of alcohol, tobacco or illicit drugs  . Current medications and supplements . Functional ability and status . Nutritional status . Physical activity . Advanced directives . List of other physicians . Hospitalizations, surgeries, and ER visits in previous 12 months . Vitals . Screenings to include cognitive, depression, and falls . Referrals and appointments  In addition, I have reviewed and discussed with patient  certain preventive protocols, quality metrics, and best practice recommendations. A written personalized care plan for preventive services as well as general preventive health recommendations were provided to patient.     Joanne Chars, LPN  [id number]/22/[id number]

## 2018-08-23 ENCOUNTER — Other Ambulatory Visit: Payer: Self-pay | Admitting: Physician Assistant

## 2018-08-23 LAB — PSA: PSA: 1.2 ng/mL (ref <=4.0)

## 2018-08-23 LAB — HEPATITIS C ANTIBODY
Hepatitis C Ab: NONREACTIVE
SIGNAL TO CUT-OFF: 0.01 (ref <1.00)

## 2018-08-23 NOTE — Progress Notes (Signed)
Call pt: PSA is great. Normal range.

## 2018-08-23 NOTE — Progress Notes (Signed)
Hep C negative.

## 2018-09-02 ENCOUNTER — Ambulatory Visit (INDEPENDENT_AMBULATORY_CARE_PROVIDER_SITE_OTHER): Payer: Medicare HMO | Admitting: Family Medicine

## 2018-09-02 ENCOUNTER — Encounter: Payer: Self-pay | Admitting: Family Medicine

## 2018-09-02 VITALS — BP 136/74 | HR 97 | Temp 98.5°F | Ht 71.0 in | Wt 245.0 lb

## 2018-09-02 DIAGNOSIS — J029 Acute pharyngitis, unspecified: Secondary | ICD-10-CM | POA: Diagnosis not present

## 2018-09-02 DIAGNOSIS — J018 Other acute sinusitis: Secondary | ICD-10-CM

## 2018-09-02 LAB — POCT RAPID STREP A (OFFICE): Rapid Strep A Screen: NEGATIVE

## 2018-09-02 NOTE — Progress Notes (Signed)
Acute Office Visit  Subjective:    Patient ID: Jake [name], male    DOB: 12-10-1960, 57 y.o.   MRN: [id number]  Chief Complaint  Patient presents with  . Sinusitis    x 2 days, he has been taking tylenol severe sinus, c/o facial pressure,ear pain,throat feeling scratchy. he denies fever, he did experience some chills last night    HPI Patient is in today for sinus sxs x 2 days.  he has been taking tylenol severe sinus, c/o facial pressure,ear pain,throat feeling scratchy. he denies fever, he did experience some chills last night. No cough. Using Tylenol Severe Sinus and says not really helping. No fever.  No chills.  Takes claritin regularly.  + tired. No GI sxs.  + watery eyes.     Past Medical History:  Diagnosis Date  . Arthritis    lower back  . Bipolar disorder ([place])   . GERD (gastroesophageal reflux disease)   . Hyperlipidemia   . Hypertension   . 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    Past Surgical History:  Procedure Laterality Date  . ANKLE FRACTURE SURGERY Left   . APPENDECTOMY    . CERVICAL FUSION    . CHOLECYSTECTOMY    . COLON SURGERY    . HERNIA REPAIR      Family History  Problem Relation Age of Onset  . Cancer Mother        breast  . Depression Mother   . Hyperlipidemia Mother   . Alcohol abuse Father   . Cancer Father        skin  . Hyperlipidemia Father   . Alzheimer's disease Father   . Cancer Maternal Grandfather   . Cancer Paternal Grandmother     Social History   Socioeconomic History  . Marital status: Married    Spouse name: Dawn  . Number of children: 1  . Years of education: 5  . Highest education level: Not on file  Occupational History  . Occupation: part time    Comment: home [employer]  . Financial resource strain: Not hard at all  . Food insecurity:    Worry: Never true    Inability: Never true  . Transportation needs:    Medical: No    Non-medical: No  Tobacco Use  .  Smoking status: Former Research scientist (life sciences)  . Smokeless tobacco: Never Used  Substance and Sexual Activity  . Alcohol use: No  . Drug use: No    Comment: 04/06/2018 Patient's wife wanted [place] to be very aware that her husband is a recovering narcotic abuser  . Sexual activity: Yes  Lifestyle  . Physical activity:    Days per week: 0 days    Minutes per session: 0 min  . Stress: Not at all  Relationships  . Social connections:    Talks on phone: Twice a week    Gets together: Once a week    Attends religious service: More than 4 times per year    Active member of club or organization: Yes    Attends meetings of clubs or organizations: More than 4 times per year    Relationship status: Married  . Intimate partner violence:    Fear of current or ex partner: No    Emotionally abused: No    Physically abused: No    Forced sexual activity: No  Other Topics Concern  . Not on file  Social History Narrative   Works part  time for [employer]. Does not exercise due to back issues in the past and strenous job. Daily caffeine use 2-3 drinks a day.    Outpatient Medications Prior to Visit  Medication Sig Dispense Refill  . AMBULATORY NON FORMULARY MEDICATION Continuous positive airway pressure (CPAP) machine set at autotitration of H2O pressure, with all supplemental supplies as needed. 1 each 0  . colestipol (COLESTID) 1 g tablet Take 1 tablet (1 g total) by mouth daily. 90 tablet 4  . dicyclomine (BENTYL) 20 MG tablet TAKE 1 TABLET BY MOUTH THREE TIMES DAILY 90 tablet 0  . ipratropium (ATROVENT) 0.06 % nasal spray Place 2 sprays into both nostrils 4 (four) times daily. 15 mL 1  . loratadine (CLARITIN) 10 MG tablet Take 10 mg by mouth daily.    [name] Kitchen OLANZapine (ZYPREXA) 10 MG tablet Take 1 tablet (10 mg total) by mouth at bedtime. 30 tablet 4  . pantoprazole (PROTONIX) 40 MG tablet TAKE 1 TABLET BY MOUTH TWICE DAILY 180 tablet 1  . sertraline (ZOLOFT) 100 MG tablet Take 1 tablet (100 mg total) by mouth daily.  30 tablet 4  . sodium bicarbonate 650 MG tablet TAKE 1 TABLET BY MOUTH 4 TIMES DAILY 360 tablet 4  . tamsulosin (FLOMAX) 0.4 MG CAPS capsule TAKE 1 CAPSULE BY MOUTH ONCE DAILY 90 capsule 1  . albuterol (PROVENTIL HFA;VENTOLIN HFA) 108 (90 Base) MCG/ACT inhaler Inhale 2 puffs into the lungs every 6 (six) hours as needed for wheezing. (Patient not taking: Reported on 08/22/2018) 2 Inhaler 1  . ondansetron (ZOFRAN) 8 MG tablet Take by mouth as needed for nausea or vomiting.     No facility-administered medications prior to visit.     Allergies  Allergen Reactions  . Fentanyl Other (See Comments)    Avoid opiates  . Other     04/06/18 Patient is a recovering narcotic abuser and, per wife, is NEVER to receive controlled substances (e.g. Narcotics, sedatives, etc.)  . Aspirin Other (See Comments)    Per pt  . Contrast Media [Iodinated Diagnostic Agents] Palpitations  . Nitroglycerin Nausea And Vomiting    ROS     Objective:    Physical Exam  Constitutional: He is oriented to person, place, and time. He appears well-developed and well-nourished.  HENT:  Head: Normocephalic and atraumatic.  Right Ear: External ear normal.  Left Ear: External ear normal.  Nose: Nose normal.  Mouth/Throat: Oropharynx is clear and moist.  TMs and canals are clear. Few petechia on back of throat. Mild bilateral conjunctivitis.   Eyes: Pupils are equal, round, and reactive to light. Conjunctivae and EOM are normal.  Neck: Neck supple. No thyromegaly present.  Cardiovascular: Normal rate and normal heart sounds.  Pulmonary/Chest: Effort normal and breath sounds normal.  Lymphadenopathy:    He has no cervical adenopathy.  Neurological: He is alert and oriented to person, place, and time.  Skin: Skin is warm and dry.  Psychiatric: He has a normal mood and affect.    BP 136/74   Pulse 97   Temp 98.5 F (36.9 C)   Ht 5\' 11"  (1.803 m)   Wt 245 lb (111.1 kg)   SpO2 94%   BMI 34.17 kg/m  Wt Readings  from Last 3 Encounters:  09/02/18 245 lb (111.1 kg)  08/22/18 246 lb (111.6 kg)  06/13/18 242 lb (109.8 kg)    Health Maintenance Due  Topic Date Due  . HIV Screening  01/06/1976  . COLONOSCOPY  01/06/2011  There are no preventive care reminders to display for this patient.   Lab Results  Component Value Date   TSH 1.32 06/09/2017   Lab Results  Component Value Date   WBC 6.2 06/09/2017   HGB 15.5 06/09/2017   HCT 45.5 06/09/2017   MCV 80.7 06/09/2017   PLT 284 06/09/2017   Lab Results  Component Value Date   NA 140 06/09/2017   K 4.2 06/09/2017   CO2 29 06/09/2017   GLUCOSE 105 (H) 06/09/2017   BUN 21 06/09/2017   CREATININE 1.19 06/09/2017   BILITOT 0.5 06/09/2017   ALKPHOS 59 11/23/2016   AST 24 06/09/2017   ALT 43 06/09/2017   PROT 7.0 06/09/2017   ALBUMIN 4.4 11/23/2016   CALCIUM 9.8 06/09/2017   Lab Results  Component Value Date   CHOL 175 06/09/2017   Lab Results  Component Value Date   HDL 38 (L) 06/09/2017   Lab Results  Component Value Date   LDLCALC 103 (H) 06/09/2017   Lab Results  Component Value Date   TRIG 226 (H) 06/09/2017   Lab Results  Component Value Date   CHOLHDL 4.6 06/09/2017   Lab Results  Component Value Date   HGBA1C 5.9 (H) 06/09/2017       Assessment & Plan:   Problem List Items Addressed This Visit    None    Visit Diagnoses    Sore throat    -  Primary   Relevant Orders   POCT rapid strep A (Completed)   Acute non-recurrent sinusitis of other sinus         Dx consistant with URI vs acute sinusitis. No fever.  Recommend symptomatic care.  Run humidifier at night. Call if not better in one week and can tx with antibiotic if not better. Call sooner if doesn't run a fever.    No orders of the defined types were placed in this encounter.    Beatrice Lecher, MD

## 2018-09-02 NOTE — Patient Instructions (Signed)

## 2018-09-05 ENCOUNTER — Encounter: Payer: Self-pay | Admitting: Physician Assistant

## 2018-09-05 ENCOUNTER — Ambulatory Visit (INDEPENDENT_AMBULATORY_CARE_PROVIDER_SITE_OTHER): Payer: Medicare HMO | Admitting: Physician Assistant

## 2018-09-05 VITALS — BP 129/75 | HR 117 | Temp 98.9°F | Ht 71.0 in | Wt 242.0 lb

## 2018-09-05 DIAGNOSIS — J329 Chronic sinusitis, unspecified: Secondary | ICD-10-CM

## 2018-09-05 DIAGNOSIS — J40 Bronchitis, not specified as acute or chronic: Secondary | ICD-10-CM

## 2018-09-05 MED ORDER — METHYLPREDNISOLONE SODIUM SUCC 125 MG IJ SOLR
125.0000 mg | Freq: Once | INTRAMUSCULAR | Status: AC
  Administered 2018-09-05: 125 mg via INTRAMUSCULAR

## 2018-09-05 MED ORDER — AZITHROMYCIN 250 MG PO TABS: ORAL_TABLET | ORAL | 0 refills | Status: DC

## 2018-09-05 MED ORDER — IPRATROPIUM BROMIDE 0.06 % NA SOLN: 2.0000 | Freq: Four times a day (QID) | NASAL | 1 refills | Status: DC

## 2018-09-05 NOTE — Patient Instructions (Signed)
Acute Bronchitis, Adult Acute bronchitis is when air tubes (bronchi) in the lungs suddenly get swollen. The condition can make it hard to breathe. It can also cause these symptoms:  A cough.  Coughing up clear, yellow, or green mucus.  Wheezing.  Chest congestion.  Shortness of breath.  A fever.  Body aches.  Chills.  A sore throat. Follow these instructions at home:  Medicines  Take over-the-counter and prescription medicines only as told by your doctor.  If you were prescribed an antibiotic medicine, take it as told by your doctor. Do not stop taking the antibiotic even if you start to feel better. General instructions  Rest.  Drink enough fluids to keep your pee (urine) pale yellow.  Avoid smoking and secondhand smoke. If you smoke and you need help quitting, ask your doctor. Quitting will help your lungs heal faster.  Use an inhaler, cool mist vaporizer, or humidifier as told by your doctor.  Keep all follow-up visits as told by your doctor. This is important. How is this prevented? To lower your risk of getting this condition again:  Wash your hands often with soap and water. If you cannot use soap and water, use hand sanitizer.  Avoid contact with people who have cold symptoms.  Try not to touch your hands to your mouth, nose, or eyes.  Make sure to get the flu shot every year. Contact a doctor if:  Your symptoms do not get better in 2 weeks. Get help right away if:  You cough up blood.  You have chest pain.  You have very bad shortness of breath.  You become dehydrated.  You faint (pass out) or keep feeling like you are going to pass out.  You keep throwing up (vomiting).  You have a very bad headache.  Your fever or chills gets worse. This information is not intended to replace advice given to you by your health care provider. Make sure you discuss any questions you have with your health care provider. Document Released: 02/10/2008 Document  Revised: 04/07/2017 Document Reviewed: 02/12/2016 Elsevier Interactive Patient Education  2019 Elsevier Inc.  

## 2018-09-05 NOTE — Progress Notes (Signed)
Subjective:    Patient ID: Jake [name], male    DOB: [birth year], 57 y.o.   MRN: [id number]  HPI  The pt is a 57 yo male who presents to the clinic to follow-up on sinus infection and sore throat.  He was seen on 09/02/2018 and given symptomatic care for likely viral infection.  His symptoms have continued and worsened.  He is having a more productive cough and sputum production.  He has a lot more sinus pressure and ear pain.  At this point is been going on for about 6 days.  He has been using his albuterol with some relief.  He is also been using Robitussin, Afrin, Mucinex with little relief.  No fever, chills.  He is very fatigued.  .. Active Ambulatory Problems    Diagnosis Date Noted  . Low back pain 05/05/2016  . Muscle spasm of back 05/05/2016  . Hyperlipidemia 05/19/2016  . Hypertriglyceridemia 05/19/2016  . Diverticulosis of colon without hemorrhage 05/19/2016  . IBS (irritable bowel syndrome) 05/19/2016  . Essential hypertension, benign 05/19/2016  . GERD (gastroesophageal reflux disease) 05/19/2016  . Allergic rhinitis 05/19/2016  . OSA on CPAP 05/19/2016  . Generalized anxiety disorder 05/19/2016  . Mood changes 05/19/2016  . Atelectasis of both lungs 11/20/2016  . 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[place]) 05/27/2017  . History of pancreatitis 05/27/2017  . Diverticulitis 05/27/2017  . Elevated fasting glucose 06/10/2017  . Low testosterone 06/10/2017  . Benign prostatic hyperplasia (BPH) with urinary urgency 06/12/2017  . No energy 06/12/2017  . Former smoker 07/25/2017  . Cough 07/25/2017  . 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 Non-restorative sleep 07/25/2017  . Anxiety 10/30/2015  . Chronic nausea 10/30/2015  . Major depressive disorder 10/30/2015  . Migraine 10/30/2015  . Neck pain 10/30/2015  . Occipital neuralgia 10/30/2015  . Substance abuse in remission ([place]) 04/07/2018  . Bipolar disorder ([place]) 07/04/2018   Resolved Ambulatory Problems    Diagnosis Date Noted   . Community acquired pneumonia of left lower lobe of lung ([place]) 11/20/2016   Past Medical History:  Diagnosis Date  . Arthritis   . Hypertension       Review of Systems See HPI.     Objective:   Physical Exam Vitals signs reviewed.  Constitutional:      Appearance: Normal appearance.  HENT:     Head: Normocephalic and atraumatic.     Right Ear: Tympanic membrane and ear canal normal.     Left Ear: Tympanic membrane and ear canal normal.     Ears:     Comments: Tenderness over bilateral maxillary sinuses to palpation.     Nose: Congestion (swollen red bilateral nasal turbinates. ) present.     Mouth/Throat:     Mouth: Mucous membranes are moist.     Pharynx: No oropharyngeal exudate.  Eyes:     Conjunctiva/sclera: Conjunctivae normal.     Pupils: Pupils are equal, round, and reactive to light.  Neck:     Musculoskeletal: Normal range of motion.  Cardiovascular:     Rate and Rhythm: Regular rhythm. Tachycardia present.  Pulmonary:     Effort: Pulmonary effort is normal.     Breath sounds: Rhonchi present.  Lymphadenopathy:     Cervical: Cervical adenopathy present.  Neurological:     General: No focal deficit present.     Mental Status: He is alert and oriented to person, place, and time.  Psychiatric:        Mood and Affect: Mood  normal.        Behavior: Behavior normal.           Assessment & Plan:  [name] KitchenMarland KitchenRhone was seen today for nasal congestion and cough.  Diagnoses and all orders for this visit:  Sinobronchitis -     ipratropium (ATROVENT) 0.06 % nasal spray; Place 2 sprays into both nostrils 4 (four) times daily. -     azithromycin (ZITHROMAX) 250 MG tablet; 2 tablets now and then one tablet for 4 days. -     methylPREDNISolone sodium succinate (SOLU-MEDROL) 125 mg/2 mL injection 125 mg   Started zpak.  Refilled ipratropium for nasal spray to replace Afrin.  Warned of rebound nasal congestion with Afrin.  Solu-Medrol was given in office  today.continue albuterol. Rest and hydrate. Follow up as needed.

## 2018-09-14 ENCOUNTER — Other Ambulatory Visit: Payer: Self-pay | Admitting: Physician Assistant

## 2018-09-22 DIAGNOSIS — G4733 Obstructive sleep apnea (adult) (pediatric): Secondary | ICD-10-CM | POA: Diagnosis not present

## 2018-10-07 ENCOUNTER — Other Ambulatory Visit: Payer: Self-pay | Admitting: Physician Assistant

## 2018-10-12 ENCOUNTER — Other Ambulatory Visit: Payer: Self-pay | Admitting: Physician Assistant

## 2018-10-23 DIAGNOSIS — G4733 Obstructive sleep apnea (adult) (pediatric): Secondary | ICD-10-CM | POA: Diagnosis not present

## 2018-11-14 ENCOUNTER — Other Ambulatory Visit: Payer: Self-pay | Admitting: Physician Assistant

## 2018-11-15 DIAGNOSIS — G4733 Obstructive sleep apnea (adult) (pediatric): Secondary | ICD-10-CM | POA: Diagnosis not present

## 2018-11-23 ENCOUNTER — Other Ambulatory Visit: Payer: Self-pay | Admitting: Family Medicine

## 2018-11-23 ENCOUNTER — Other Ambulatory Visit: Payer: Self-pay | Admitting: Physician Assistant

## 2018-11-24 ENCOUNTER — Ambulatory Visit: Payer: Medicare HMO | Admitting: Psychiatry

## 2018-12-21 ENCOUNTER — Telehealth: Payer: Self-pay | Admitting: Physician Assistant

## 2018-12-21 MED ORDER — ONDANSETRON HCL 8 MG PO TABS: 8.0000 mg | ORAL_TABLET | Freq: Three times a day (TID) | ORAL | 5 refills | Status: DC | PRN

## 2018-12-21 NOTE — Telephone Encounter (Signed)
Called for refill on zofran. Sent.

## 2018-12-22 ENCOUNTER — Ambulatory Visit: Payer: Medicare HMO | Admitting: Psychiatry

## 2019-01-03 ENCOUNTER — Other Ambulatory Visit: Payer: Self-pay | Admitting: Physician Assistant

## 2019-01-03 NOTE — Telephone Encounter (Signed)
Message sent to front office to schedule patient.

## 2019-01-03 NOTE — Telephone Encounter (Signed)
We can do a visit to get all medications up to date. I will send this now though.

## 2019-01-03 NOTE — Telephone Encounter (Signed)
Does he need a follow up with you? Please advise.

## 2019-01-16 ENCOUNTER — Ambulatory Visit (INDEPENDENT_AMBULATORY_CARE_PROVIDER_SITE_OTHER): Payer: Medicare HMO | Admitting: Physician Assistant

## 2019-01-16 ENCOUNTER — Encounter: Payer: Self-pay | Admitting: Physician Assistant

## 2019-01-16 VITALS — BP 138/83 | HR 84 | Temp 98.2°F | Ht 71.0 in | Wt 246.0 lb

## 2019-01-16 DIAGNOSIS — M62830 Muscle spasm of back: Secondary | ICD-10-CM | POA: Diagnosis not present

## 2019-01-16 DIAGNOSIS — M545 Low back pain, unspecified: Secondary | ICD-10-CM

## 2019-01-16 DIAGNOSIS — H6121 Impacted cerumen, right ear: Secondary | ICD-10-CM | POA: Diagnosis not present

## 2019-01-16 DIAGNOSIS — H9191 Unspecified hearing loss, right ear: Secondary | ICD-10-CM | POA: Diagnosis not present

## 2019-01-16 MED ORDER — CYCLOBENZAPRINE HCL 10 MG PO TABS: 10.0000 mg | ORAL_TABLET | Freq: Three times a day (TID) | ORAL | 0 refills | Status: DC | PRN

## 2019-01-16 MED ORDER — METHYLPREDNISOLONE SODIUM SUCC 125 MG IJ SOLR
125.0000 mg | Freq: Once | INTRAMUSCULAR | Status: AC
  Administered 2019-01-16: 125 mg via INTRAMUSCULAR

## 2019-01-16 MED ORDER — METHYLPREDNISOLONE ACETATE 80 MG/ML IJ SUSP
80.0000 mg | Freq: Once | INTRAMUSCULAR | Status: AC
  Administered 2019-01-16: 80 mg via INTRAMUSCULAR

## 2019-01-16 MED ORDER — IBUPROFEN 800 MG PO TABS: 800.0000 mg | ORAL_TABLET | Freq: Three times a day (TID) | ORAL | 2 refills | Status: DC | PRN

## 2019-01-16 MED ORDER — CARBAMIDE PEROXIDE 6.5 % OT SOLN: 10.0000 [drp] | Freq: Two times a day (BID) | OTIC | 0 refills | Status: DC

## 2019-01-16 NOTE — Patient Instructions (Signed)
Low Back Strain Rehab  Ask your health care provider which exercises are safe for you. Do exercises exactly as told by your health care provider and adjust them as directed. It is normal to feel mild stretching, pulling, tightness, or discomfort as you do these exercises, but you should stop right away if you feel sudden pain or your pain gets worse. Do not begin these exercises until told by your health care provider.  Stretching and range of motion exercises  These exercises warm up your muscles and joints and improve the movement and flexibility of your back. These exercises also help to relieve pain, numbness, and tingling.  Exercise A: Single knee to chest    1. Lie on your back on a firm surface with both legs straight.  2. Bend one of your knees. Use your hands to move your knee up toward your chest until you feel a gentle stretch in your lower back and buttock.  ? Hold your leg in this position by holding onto the front of your knee.  ? Keep your other leg as straight as possible.  3. Hold for __________ seconds.  4. Slowly return to the starting position.  5. Repeat with your other leg.  Repeat __________ times. Complete this exercise __________ times a day.  Exercise B: Prone extension on elbows    1. Lie on your abdomen on a firm surface.  2. Prop yourself up on your elbows.  3. Use your arms to help lift your chest up until you feel a gentle stretch in your abdomen and your lower back.  ? This will place some of your body weight on your elbows. If this is uncomfortable, try stacking pillows under your chest.  ? Your hips should stay down, against the surface that you are lying on. Keep your hip and back muscles relaxed.  4. Hold for __________ seconds.  5. Slowly relax your upper body and return to the starting position.  Repeat __________ times. Complete this exercise __________ times a day.  Strengthening exercises  These exercises build strength and endurance in your back. Endurance is the ability to  use your muscles for a long time, even after they get tired.  Exercise C: Pelvic tilt  1. Lie on your back on a firm surface. Bend your knees and keep your feet flat.  2. Tense your abdominal muscles. Tip your pelvis up toward the ceiling and flatten your lower back into the floor.  ? To help with this exercise, you may place a small towel under your lower back and try to push your back into the towel.  3. Hold for __________ seconds.  4. Let your muscles relax completely before you repeat this exercise.  Repeat __________ times. Complete this exercise __________ times a day.  Exercise D: Alternating arm and leg raises    1. Get on your hands and knees on a firm surface. If you are on a hard floor, you may want to use padding to cushion your knees, such as an exercise mat.  2. Line up your arms and legs. Your hands should be below your shoulders, and your knees should be below your hips.  3. Lift your left leg behind you. At the same time, raise your right arm and straighten it in front of you.  ? Do not lift your leg higher than your hip.  ? Do not lift your arm higher than your shoulder.  ? Keep your abdominal and back muscles tight.  ?   Keep your hips facing the ground.  ? Do not arch your back.  ? Keep your balance carefully, and do not hold your breath.  4. Hold for __________ seconds.  5. Slowly return to the starting position and repeat with your right leg and your left arm.  Repeat __________ times. Complete this exercise __________times a day.  Exercise J: Single leg lower with bent knees  1. Lie on your back on a firm surface.  2. Tense your abdominal muscles and lift your feet off the floor, one foot at a time, so your knees and hips are bent in an "L" shape (at about 90 degrees).  ? Your knees should be over your hips and your lower legs should be parallel to the floor.  3. Keeping your abdominal muscles tense and your knee bent, slowly lower one of your legs so your toe touches the ground.  4. Lift your  leg back up to return to the starting position.  ? Do not hold your breath.  ? Do not let your back arch. Keep your back flat against the ground.  5. Repeat with your other leg.  Repeat __________ times. Complete this exercise __________ times a day.  Posture and body mechanics    Body mechanics refers to the movements and positions of your body while you do your daily activities. Posture is part of body mechanics. Good posture and healthy body mechanics can help to relieve stress in your body's tissues and joints. Good posture means that your spine is in its natural S-curve position (your spine is neutral), your shoulders are pulled back slightly, and your head is not tipped forward. The following are general guidelines for applying improved posture and body mechanics to your everyday activities.  Standing     When standing, keep your spine neutral and your feet about hip-width apart. Keep a slight bend in your knees. Your ears, shoulders, and hips should line up.   When you do a task in which you stand in one place for a long time, place one foot up on a stable object that is 2-4 inches (5-10 cm) high, such as a footstool. This helps keep your spine neutral.  Sitting     When sitting, keep your spine neutral and keep your feet flat on the floor. Use a footrest, if necessary, and keep your thighs parallel to the floor. Avoid rounding your shoulders, and avoid tilting your head forward.   When working at a desk or a computer, keep your desk at a height where your hands are slightly lower than your elbows. Slide your chair under your desk so you are close enough to maintain good posture.   When working at a computer, place your monitor at a height where you are looking straight ahead and you do not have to tilt your head forward or downward to look at the screen.  Resting     When lying down and resting, avoid positions that are most painful for you.   If you have pain with activities such as sitting, bending,  stooping, or squatting (flexion-based activities), lie in a position in which your body does not bend very much. For example, avoid curling up on your side with your arms and knees near your chest (fetal position).   If you have pain with activities such as standing for a long time or reaching with your arms (extension-based activities), lie with your spine in a neutral position and bend your knees slightly. Try the   following positions:  ? Lying on your side with a pillow between your knees.  ? Lying on your back with a pillow under your knees.  Lifting     When lifting objects, keep your feet at least shoulder-width apart and tighten your abdominal muscles.   Bend your knees and hips and keep your spine neutral. It is important to lift using the strength of your legs, not your back. Do not lock your knees straight out.   Always ask for help to lift heavy or awkward objects.  This information is not intended to replace advice given to you by your health care provider. Make sure you discuss any questions you have with your health care provider.  Document Released: 08/24/2005 Document Revised: 04/30/2016 Document Reviewed: 06/05/2015  Elsevier Interactive Patient Education  2019 [employer]

## 2019-01-16 NOTE — Progress Notes (Signed)
Subjective:    Patient ID: Jake [name], male    DOB: [birth year], 58 y.o.   MRN: [id number]  HPI  Pt is a 58 yo male with chronic low back pain who presents to the clinic with acute flare. He was working in the yard and trenching out for his wife's garden. No injury but woke up the next morning in a lot of pain and tightness. Pain and tightness starts in low back and goes through mid back. Rates 7-8 out of 10. He has been using ibuprofen and tylenol and heat. He denies any radiation of pain into legs, no saddle anesthesia, no bowel or bladder dysfunction, no leg weakness.   In the last 6 month he has had epidural injections which helped but he does not want to do that again.   His wife complains that he cannot hear. He does feel like at times he has problems hearing out of right ear. No pain.   .. Active Ambulatory Problems    Diagnosis Date Noted  . Low back pain 05/05/2016  . Muscle spasm of back 05/05/2016  . Hyperlipidemia 05/19/2016  . Hypertriglyceridemia 05/19/2016  . Diverticulosis of colon without hemorrhage 05/19/2016  . IBS (irritable bowel syndrome) 05/19/2016  . Essential hypertension, benign 05/19/2016  . GERD (gastroesophageal reflux disease) 05/19/2016  . Allergic rhinitis 05/19/2016  . OSA on CPAP 05/19/2016  . Generalized anxiety disorder 05/19/2016  . Mood changes 05/19/2016  . Atelectasis of both lungs 11/20/2016  . 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 History of pancreatitis 05/27/2017  . Diverticulitis 05/27/2017  . Elevated fasting glucose 06/10/2017  . Low testosterone 06/10/2017  . Benign prostatic hyperplasia (BPH) with urinary urgency 06/12/2017  . No energy 06/12/2017  . Former smoker 07/25/2017  . Cough 07/25/2017  . 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 Non-restorative sleep 07/25/2017  . Anxiety 10/30/2015  . Chronic nausea 10/30/2015  . Major depressive disorder 10/30/2015  . Migraine 10/30/2015  . Neck pain 10/30/2015  . Occipital  neuralgia 10/30/2015  . Substance abuse in remission ([place]) 04/07/2018  . Bipolar disorder ([place]) 07/04/2018  . Hearing difficulty of right ear 01/16/2019  . Impacted cerumen of right ear 01/16/2019   Resolved Ambulatory Problems    Diagnosis Date Noted  . Community acquired pneumonia of left lower lobe of lung ([place]) 11/20/2016   Past Medical History:  Diagnosis Date  . Arthritis   . Hypertension        Review of Systems See HPI.     Objective:   Physical Exam Vitals signs reviewed.  Constitutional:      Appearance: Normal appearance.  HENT:     Head: Normocephalic and atraumatic.     Right Ear: There is impacted cerumen.     Left Ear: Tympanic membrane, ear canal and external ear normal. There is no impacted cerumen.     Nose: Nose normal. No congestion.  Eyes:     Conjunctiva/sclera: Conjunctivae normal.     Pupils: Pupils are equal, round, and reactive to light.  Cardiovascular:     Rate and Rhythm: Normal rate and regular rhythm.     Pulses: Normal pulses.  Pulmonary:     Effort: Pulmonary effort is normal.     Breath sounds: Normal breath sounds.  Musculoskeletal:     Comments: Limited ROM due to pain at the waist.  Tightness and some tenderness of paraspinal muscles of mid and low back.  No tenderness over spine to palpation.  Negative straight  leg.   Neurological:     General: No focal deficit present.     Mental Status: He is alert and oriented to person, place, and time.  Psychiatric:        Mood and Affect: Mood normal.        Behavior: Behavior normal.           Assessment & Plan:  [name] KitchenMarland KitchenJanes was seen today for back pain.  Diagnoses and all orders for this visit:  Acute midline low back pain without sciatica -     cyclobenzaprine (FLEXERIL) 10 MG tablet; Take 1 tablet (10 mg total) by mouth 3 (three) times daily as needed for muscle spasms. -     ibuprofen (ADVIL) 800 MG tablet; Take 1 tablet (800 mg total) by mouth every 8 (eight) hours as  needed. -     methylPREDNISolone acetate (DEPO-MEDROL) injection 80 mg -     methylPREDNISolone sodium succinate (SOLU-MEDROL) 125 mg/2 mL injection 125 mg  Impacted cerumen of right ear -     carbamide peroxide (DEBROX) 6.5 % OTIC solution; Place 10 drops into both ears 2 (two) times daily. Dispense 1 bottle  Muscle spasm of back -     cyclobenzaprine (FLEXERIL) 10 MG tablet; Take 1 tablet (10 mg total) by mouth 3 (three) times daily as needed for muscle spasms. -     ibuprofen (ADVIL) 800 MG tablet; Take 1 tablet (800 mg total) by mouth every 8 (eight) hours as needed. -     methylPREDNISolone acetate (DEPO-MEDROL) injection 80 mg -     methylPREDNISolone sodium succinate (SOLU-MEDROL) 125 mg/2 mL injection 125 mg  Hearing difficulty of right ear   I suspect pain is more muscle spasm. Pt given IM injections of steroid to help with inflammation. Can use 800mg  of ibuprofen as needed for the next few days. Flexeril as needed. Pt has tens unit at home. Encouraged 15-20 minute use 2-3 times a day with heating pad. Can use icy hot patches during the day. Discussed stretches and exercises for low back. Pt has hx of substances abuse. No narcotics to be given. Follow up as needed.   Cerumen irrigation attempted today but too impacted. Debrox given to use may return for irrigation in next week or so.   [name] Kitchen.Cerumen Removal Template: Indication: Cerumen impaction of the ear(s) Medical necessity statement: On physical examination, cerumen impairs clinically significant portions of the external auditory canal, and tympanic membrane. Noted obstructive, copious cerumen that cannot be removed without magnification and instrumentations requiring physician skills Consent: Discussed benefits and risks of procedure and verbal consent obtained Procedure: Patient was prepped for the procedure. Utilized an otoscope to assess and take note of the ear canal, the tympanic membrane, and the presence, amount, and  placement of the cerumen. Gentle water irrigation and soft plastic curette was utilized to remove cerumen.  Post procedure examination shows cerumen was completely removed. Patient tolerated procedure well. The patient is made aware that they may experience temporary vertigo, temporary hearing loss, and temporary discomfort. If these symptom last for more than 24 hours to call the clinic or proceed to the ED.   Hearing test was normal.

## 2019-01-17 ENCOUNTER — Encounter: Payer: Self-pay | Admitting: Physician Assistant

## 2019-01-19 ENCOUNTER — Ambulatory Visit: Payer: Medicare HMO | Admitting: Psychiatry

## 2019-02-09 ENCOUNTER — Other Ambulatory Visit: Payer: Self-pay | Admitting: Physician Assistant

## 2019-02-22 ENCOUNTER — Ambulatory Visit: Payer: Medicare HMO | Admitting: Psychiatry

## 2019-03-08 DIAGNOSIS — G4733 Obstructive sleep apnea (adult) (pediatric): Secondary | ICD-10-CM | POA: Diagnosis not present

## 2019-03-09 ENCOUNTER — Other Ambulatory Visit: Payer: Self-pay | Admitting: Family Medicine

## 2019-03-17 ENCOUNTER — Encounter: Payer: Self-pay | Admitting: Family Medicine

## 2019-03-17 ENCOUNTER — Ambulatory Visit (INDEPENDENT_AMBULATORY_CARE_PROVIDER_SITE_OTHER): Payer: Medicare HMO

## 2019-03-17 ENCOUNTER — Telehealth: Payer: Self-pay | Admitting: Physician Assistant

## 2019-03-17 ENCOUNTER — Other Ambulatory Visit: Payer: Self-pay

## 2019-03-17 ENCOUNTER — Ambulatory Visit: Payer: Medicare HMO | Admitting: Family Medicine

## 2019-03-17 ENCOUNTER — Ambulatory Visit (INDEPENDENT_AMBULATORY_CARE_PROVIDER_SITE_OTHER): Payer: Medicare HMO | Admitting: Family Medicine

## 2019-03-17 VITALS — BP 148/89 | HR 88 | Ht 71.0 in | Wt 251.0 lb

## 2019-03-17 DIAGNOSIS — H814 Vertigo of central origin: Secondary | ICD-10-CM

## 2019-03-17 DIAGNOSIS — R9431 Abnormal electrocardiogram [ECG] [EKG]: Secondary | ICD-10-CM | POA: Diagnosis not present

## 2019-03-17 DIAGNOSIS — R7301 Impaired fasting glucose: Secondary | ICD-10-CM | POA: Diagnosis not present

## 2019-03-17 DIAGNOSIS — R519 Headache, unspecified: Secondary | ICD-10-CM

## 2019-03-17 DIAGNOSIS — R42 Dizziness and giddiness: Secondary | ICD-10-CM

## 2019-03-17 DIAGNOSIS — R51 Headache: Secondary | ICD-10-CM

## 2019-03-17 DIAGNOSIS — H938X1 Other specified disorders of right ear: Secondary | ICD-10-CM | POA: Diagnosis not present

## 2019-03-17 DIAGNOSIS — R030 Elevated blood-pressure reading, without diagnosis of hypertension: Secondary | ICD-10-CM | POA: Diagnosis not present

## 2019-03-17 DIAGNOSIS — R079 Chest pain, unspecified: Secondary | ICD-10-CM | POA: Diagnosis not present

## 2019-03-17 NOTE — Patient Instructions (Signed)
Keep a home blood pressure log through the weekend and give [place] a call on Monday to let us know how you are doing and what your blood pressure is doing.

## 2019-03-17 NOTE — Telephone Encounter (Signed)
Called insurance and CT head without contrast case number [id number] was authorized. Authorization number is [id number]. Given to [employer] to enter into system. Called imaging and gave Apolonio Schneiders the Josem Kaufmann #. [place] LPN

## 2019-03-17 NOTE — Progress Notes (Signed)
Established Patient Office Visit  Subjective:  Patient ID: Jake [name], male    DOB: 10/26/1960  Age: 58 y.o. MRN: [id number]  CC:  Chief Complaint  Patient presents with  . Dizziness    HPI Jake [name] presents for Headaches and dizziness.  He says yesterday afternoon he actually felt a little dizzy.  Somewhat room spinning.  He denies any feeling like he was going to pass out and then last night while he and his wife are watching TV he said it almost came in like a wave or a rash over his body or he suddenly felt intensely dizzy and then started developing a headache.  He said he just went to his bedroom to lay down.  He finally fell asleep and then this morning when he woke up he felt a little better but still has some headache as well as some of the dizziness is not completely resolved.  He denies any other neurologic symptoms such as speech impediment, weakness, vision changes etc.  He denies any recent upper respiratory symptoms fevers chills or sweats.  He denies any nausea vomiting or diarrhea with the dizziness.  He denies any sore throat.  He denies any recent swelling.  He has had some pain in his right ear on and off for about the last 2 days it feels just more like pressure.  But no drainage.  He reports that he had a history of positional vertigo about 7 to 8 years ago that eventually resolved and has not had problems since.  He says it feels somewhat similar but it also feels different to him.  He also reports of the last couple months since being home with COVID and gaining some weight he felt like he is become gradually short of breath.  He just chalked it up to weight gain which has been significant.  His wife reports that she has noticed a worsening of the shortness of breath really in the last 2 weeks to the point that she is noticed him heavy breathing quite frequently.  Scribes the dizziness as a feeling like you are going to fall forward.  He reports that his  headache last night was a 8 out of 10.  His wife is here with him for the visit as well.  Past Medical History:  Diagnosis Date  . Arthritis    lower back  . Bipolar disorder ([place])   . GERD (gastroesophageal reflux disease)   . Hyperlipidemia   . Hypertension   . Substance abuse in remission ([place]) 04/06/2018   AVOID OPIATES!    Past Surgical History:  Procedure Laterality Date  . ANKLE FRACTURE SURGERY Left   . APPENDECTOMY    . CERVICAL FUSION    . CHOLECYSTECTOMY    . COLON SURGERY    . HERNIA REPAIR      Family History  Problem Relation Age of Onset  . Cancer Mother        breast  . Depression Mother   . Hyperlipidemia Mother   . Alcohol abuse Father   . Cancer Father        skin  . Hyperlipidemia Father   . Alzheimer's disease Father   . Cancer Maternal Grandfather   . Cancer Paternal Grandmother     Social History   Socioeconomic History  . Marital status: Married    Spouse name: Dawn  . Number of children: 1  . Years of education: 25  . Highest education level: Not on file  Occupational History  . Occupation: part time    Comment: home [place]  . Financial resource strain: Not hard at all  . Food insecurity    Worry: Never true    Inability: Never true  . Transportation needs    Medical: No    Non-medical: No  Tobacco Use  . Smoking status: Former Research scientist (life sciences)  . Smokeless tobacco: Never Used  Substance and Sexual Activity  . Alcohol use: No  . Drug use: No    Comment: 04/06/2018 Patient's wife wanted [place] to be very aware that her husband is a recovering narcotic abuser  . Sexual activity: Yes  Lifestyle  . Physical activity    Days per week: 0 days    Minutes per session: 0 min  . Stress: Not at all  Relationships  . Social Herbalist on phone: Twice a week    Gets together: Once a week    Attends religious service: More than 4 times per year    Active member of club or organization: Yes    Attends meetings of clubs  or organizations: More than 4 times per year    Relationship status: Married  . Intimate partner violence    Fear of current or ex partner: No    Emotionally abused: No    Physically abused: No    Forced sexual activity: No  Other Topics Concern  . Not on file  Social History Narrative   Works part time for [employer] Does not exercise due to back issues in the past and strenous job. Daily caffeine use 2-3 drinks a day.    Outpatient Medications Prior to Visit  Medication Sig Dispense Refill  . AMBULATORY NON FORMULARY MEDICATION Continuous positive airway pressure (CPAP) machine set at autotitration of H2O pressure, with all supplemental supplies as needed. 1 each 0  . colestipol (COLESTID) 1 g tablet Take 1 tablet by mouth once daily 90 tablet 4  . dicyclomine (BENTYL) 20 MG tablet TAKE 1 TABLET BY MOUTH THREE TIMES DAILY 90 tablet 2  . ibuprofen (ADVIL) 800 MG tablet Take 1 tablet (800 mg total) by mouth every 8 (eight) hours as needed. 60 tablet 2  . ipratropium (ATROVENT) 0.06 % nasal spray Place 2 sprays into both nostrils 4 (four) times daily. 15 mL 1  . loratadine (CLARITIN) 10 MG tablet Take 10 mg by mouth daily.    [name] Kitchen OLANZapine (ZYPREXA) 10 MG tablet Take 1 tablet (10 mg total) by mouth at bedtime. 30 tablet 4  . ondansetron (ZOFRAN) 8 MG tablet Take 1 tablet (8 mg total) by mouth every 8 (eight) hours as needed for nausea or vomiting. 20 tablet 5  . pantoprazole (PROTONIX) 40 MG tablet TAKE 1 TABLET BY MOUTH TWICE DAILY 180 tablet 3  . sertraline (ZOLOFT) 100 MG tablet Take 1 tablet (100 mg total) by mouth daily. 30 tablet 4  . sodium bicarbonate 650 MG tablet TAKE 1 TABLET BY MOUTH 4 TIMES DAILY 360 tablet 4  . tamsulosin (FLOMAX) 0.4 MG CAPS capsule Take 1 capsule by mouth once daily 90 capsule 0  . carbamide peroxide (DEBROX) 6.5 % OTIC solution Place 10 drops into both ears 2 (two) times daily. Dispense 1 bottle 1 mL 0  . cyclobenzaprine (FLEXERIL) 10 MG tablet Take 1  tablet (10 mg total) by mouth 3 (three) times daily as needed for muscle spasms. 30 tablet 0   No facility-administered medications prior to visit.  Allergies  Allergen Reactions  . Fentanyl Other (See Comments)    Avoid opiates  . Other     04/06/18 Patient is a recovering narcotic abuser and, per wife, is NEVER to receive controlled substances (e.g. Narcotics, sedatives, etc.)  . Aspirin Other (See Comments)    Per pt  . Contrast Media [Iodinated Diagnostic Agents] Palpitations  . Nitroglycerin Nausea And Vomiting    ROS Review of Systems    Objective:    Physical Exam  Constitutional: He is oriented to person, place, and time. He appears well-developed and well-nourished.  HENT:  Head: Normocephalic and atraumatic.  Right Ear: External ear normal.  Left Ear: External ear normal.  Nose: Nose normal.  TM and canals clear.  Right TM is blocked by cerumen but the canal is normal.  Eyes: Pupils are equal, round, and reactive to light. Conjunctivae and EOM are normal. Right eye exhibits no discharge. Left eye exhibits no discharge.  Neck: Neck supple. No thyromegaly present.  Cardiovascular: Normal rate and normal heart sounds.  Pulmonary/Chest: Effort normal and breath sounds normal.  Musculoskeletal:        General: No edema.  Lymphadenopathy:    He has no cervical adenopathy.  Neurological: He is alert and oriented to person, place, and time. He has normal reflexes. No cranial nerve deficit. Coordination normal.  Skin: Skin is warm and dry.  Psychiatric: He has a normal mood and affect. His behavior is normal.    BP (!) 148/89   Pulse 88   Ht 5\' 11"  (1.803 m)   Wt 251 lb (113.9 kg)   SpO2 96%   BMI 35.01 kg/m  Wt Readings from Last 3 Encounters:  03/17/19 251 lb (113.9 kg)  01/16/19 246 lb (111.6 kg)  09/05/18 242 lb (109.8 kg)     Health Maintenance Due  Topic Date Due  . HIV Screening  01/06/1976  . COLONOSCOPY  01/06/2011    There are no preventive  care reminders to display for this patient.  Lab Results  Component Value Date   TSH 1.32 06/09/2017   Lab Results  Component Value Date   WBC 6.2 06/09/2017   HGB 15.5 06/09/2017   HCT 45.5 06/09/2017   MCV 80.7 06/09/2017   PLT 284 06/09/2017   Lab Results  Component Value Date   NA 140 06/09/2017   K 4.2 06/09/2017   CO2 29 06/09/2017   GLUCOSE 105 (H) 06/09/2017   BUN 21 06/09/2017   CREATININE 1.19 06/09/2017   BILITOT 0.5 06/09/2017   ALKPHOS 59 11/23/2016   AST 24 06/09/2017   ALT 43 06/09/2017   PROT 7.0 06/09/2017   ALBUMIN 4.4 11/23/2016   CALCIUM 9.8 06/09/2017   Lab Results  Component Value Date   CHOL 175 06/09/2017   Lab Results  Component Value Date   HDL 38 (L) 06/09/2017   Lab Results  Component Value Date   LDLCALC 103 (H) 06/09/2017   Lab Results  Component Value Date   TRIG 226 (H) 06/09/2017   Lab Results  Component Value Date   CHOLHDL 4.6 06/09/2017   Lab Results  Component Value Date   HGBA1C 5.9 (H) 06/09/2017      Assessment & Plan:   Problem List Items Addressed This Visit    None    Visit Diagnoses    Dizziness    -  Primary   Relevant Orders   COMPLETE METABOLIC PANEL WITH GFR   CBC with Differential/Platelet   Hemoglobin  A1c   EKG 12-Lead   CT Head Wo Contrast   B Nat Peptide   DG Chest 2 View   TSH   ECHOCARDIOGRAM COMPLETE   Acute intractable headache, unspecified headache type       Relevant Orders   COMPLETE METABOLIC PANEL WITH GFR   CBC with Differential/Platelet   Hemoglobin A1c   CT Head Wo Contrast   B Nat Peptide   DG Chest 2 View   TSH   ECHOCARDIOGRAM COMPLETE   Vertigo of central origin       Relevant Orders   COMPLETE METABOLIC PANEL WITH GFR   CBC with Differential/Platelet   Hemoglobin A1c   CT Head Wo Contrast   B Nat Peptide   DG Chest 2 View   TSH   ECHOCARDIOGRAM COMPLETE   Abnormal EKG       Relevant Orders   COMPLETE METABOLIC PANEL WITH GFR   CBC with  Differential/Platelet   Hemoglobin A1c   CT Head Wo Contrast   B Nat Peptide   DG Chest 2 View   TSH   ECHOCARDIOGRAM COMPLETE   Ear pressure, right       Elevated BP without diagnosis of hypertension          Dizziness-sounds most consistent with vertigo versus near syncope.  Negative Dix-Hallpike maneuver.  Could be sinus related.  Could also be blood pressure related as he does report that years ago he was on blood pressure medication but it eventually came down so he came off of the medication.  He has not checked his blood pressure in a long time until when he checked it this morning at home and his systolic was 102 this morning.  I am also concerned about the possibility of stroke with a sudden onset of dizziness and headache.  Will get CAT scan for further evaluation.  Also check a CBC to evaluate for anemia and infection as well as thyroid disorder and electrolyte disturbance.  Also check renal and liver function.  Abnormal EKG-EKG showing signs of left ventricular hypertrophy.  Rate of 83 bpm, normal sinus rhythm with some left axis deviation and LVH.  We will move forward with getting echocardiogram.  Explained the most common cause people have LVH is uncontrolled hypertension.  Elevated blood pressure-I want him to check his blood pressures through the weekend if they continue to stay elevated then we need to start blood pressure medication on Monday.  The meantime continue to work on diet and low salt intake.  Right ear pain-unable to see the tympanic membrane because of cerumen.  Follow if symptoms are worsening.  Shortness of breath-we will go ahead and get chest x-ray as well.  No orders of the defined types were placed in this encounter.   Follow-up: Return in about 1 week (around 03/24/2019) for recheck BP .    Beatrice Lecher, MD

## 2019-03-17 NOTE — Telephone Encounter (Addendum)
FYI: Spouse called. Jake [name] experienced sudden dizziness and headache last night and bp this morning is 160/182.  I spoke to [facility] in Triage and she recommended that patient either be  seen at  [facility] or go to the ER, pt declined both. He wanted to be seen in the office. He is scheduled with Dr Madilyn Fireman for this morning at  11:15(lives in [place] and can get here at this time).

## 2019-03-18 LAB — CBC WITH DIFFERENTIAL/PLATELET
Absolute Monocytes: 645 cells/uL (ref 200–950)
Basophils Absolute: 31 cells/uL (ref 0–200)
Basophils Relative: 0.5 %
Eosinophils Absolute: 112 cells/uL (ref 15–500)
Eosinophils Relative: 1.8 %
HCT: 42.9 % (ref 38.5–50.0)
Hemoglobin: 14.4 g/dL (ref 13.2–17.1)
Lymphs Abs: 1172 cells/uL (ref 850–3900)
MCH: 28.1 pg (ref 27.0–33.0)
MCHC: 33.6 g/dL (ref 32.0–36.0)
MCV: 83.6 fL (ref 80.0–100.0)
MPV: 9.9 fL (ref 7.5–12.5)
Monocytes Relative: 10.4 %
Neutro Abs: 4241 cells/uL (ref 1500–7800)
Neutrophils Relative %: 68.4 %
Platelets: 242 10*3/uL (ref 140–400)
RBC: 5.13 10*6/uL (ref 4.20–5.80)
RDW: 14.1 % (ref 11.0–15.0)
Total Lymphocyte: 18.9 %
WBC: 6.2 10*3/uL (ref 3.8–10.8)

## 2019-03-18 LAB — COMPLETE METABOLIC PANEL WITH GFR
AG Ratio: 2.2 (calc) (ref 1.0–2.5)
ALT: 55 U/L — ABNORMAL HIGH (ref 9–46)
AST: 33 U/L (ref 10–35)
Albumin: 4.8 g/dL (ref 3.6–5.1)
Alkaline phosphatase (APISO): 66 U/L (ref 35–144)
BUN/Creatinine Ratio: 23 (calc) — ABNORMAL HIGH (ref 6–22)
BUN: 27 mg/dL — ABNORMAL HIGH (ref 7–25)
CO2: 28 mmol/L (ref 20–32)
Calcium: 9.8 mg/dL (ref 8.6–10.3)
Chloride: 103 mmol/L (ref 98–110)
Creat: 1.2 mg/dL (ref 0.70–1.33)
GFR, Est African American: 77 mL/min/{1.73_m2} (ref >=60)
GFR, Est Non African American: 66 mL/min/{1.73_m2} (ref >=60)
Globulin: 2.2 g/dL (calc) (ref 1.9–3.7)
Glucose, Bld: 92 mg/dL (ref 65–99)
Potassium: 4.6 mmol/L (ref 3.5–5.3)
Sodium: 138 mmol/L (ref 135–146)
Total Bilirubin: 0.5 mg/dL (ref 0.2–1.2)
Total Protein: 7 g/dL (ref 6.1–8.1)

## 2019-03-18 LAB — HEMOGLOBIN A1C
Hgb A1c MFr Bld: 6.4 % of total Hgb — ABNORMAL HIGH (ref <5.7)
Mean Plasma Glucose: 137 (calc)
eAG (mmol/L): 7.6 (calc)

## 2019-03-18 LAB — BRAIN NATRIURETIC PEPTIDE: Brain Natriuretic Peptide: <4 pg/mL (ref <100)

## 2019-03-18 LAB — TSH: TSH: 0.96 mIU/L (ref 0.40–4.50)

## 2019-03-20 ENCOUNTER — Encounter: Payer: Self-pay | Admitting: Family Medicine

## 2019-03-20 DIAGNOSIS — R7301 Impaired fasting glucose: Secondary | ICD-10-CM | POA: Insufficient documentation

## 2019-04-03 ENCOUNTER — Ambulatory Visit (INDEPENDENT_AMBULATORY_CARE_PROVIDER_SITE_OTHER): Payer: Medicare HMO | Admitting: Psychiatry

## 2019-04-03 ENCOUNTER — Encounter: Payer: Self-pay | Admitting: Psychiatry

## 2019-04-03 ENCOUNTER — Other Ambulatory Visit: Payer: Self-pay

## 2019-04-03 DIAGNOSIS — R69 Illness, unspecified: Secondary | ICD-10-CM | POA: Diagnosis not present

## 2019-04-03 DIAGNOSIS — F411 Generalized anxiety disorder: Secondary | ICD-10-CM | POA: Diagnosis not present

## 2019-04-03 DIAGNOSIS — F319 Bipolar disorder, unspecified: Secondary | ICD-10-CM

## 2019-04-03 MED ORDER — OLANZAPINE 10 MG PO TABS: 10.0000 mg | ORAL_TABLET | Freq: Every day | ORAL | 1 refills | Status: DC

## 2019-04-03 MED ORDER — SERTRALINE HCL 100 MG PO TABS: 100.0000 mg | ORAL_TABLET | Freq: Every day | ORAL | 1 refills | Status: DC

## 2019-04-03 NOTE — Progress Notes (Signed)
Jake [name] [id number] [birth year] 58 y.o.  Virtual Visit via Video Note  I connected with pt on 04/03/19 at 11:00 AM EDT by a video enabled telemedicine application and verified that I am speaking with the correct person using two identifiers.   I discussed the limitations of evaluation and management by telemedicine and the availability of in person appointments. The patient expressed understanding and agreed to proceed.  I discussed the assessment and treatment plan with the patient. The patient was provided an opportunity to ask questions and all were answered. The patient agreed with the plan and demonstrated an understanding of the instructions.   The patient was advised to call back or seek an in-person evaluation if the symptoms worsen or if the condition fails to improve as anticipated.  I provided 20 minutes of non-face-to-face time during this encounter.  The patient was located at home.  The provider was located at [place].   Thayer Headings, PMHNP   Subjective:   Patient ID:  Jake [name] is a 58 y.o. (DOB 22-Apr-1961) male.  Chief Complaint:  Chief Complaint  Patient presents with  . Follow-up    h/o Bipolar D/O and Anxiety    HPI Jake [name] presents for follow-up of mood and anxiety. His wife also participates in video visit.  He is a previous patient of [facility], [place].  He reports that he has a h/o Bipolar D/O and anxiety. He reports that in the past he has had predominantly more depression than mania. He reports that he has had a stable mood. He reports some mild situational depression and anxiety. He reports that in the past he has had worry and physical s/s with anxiety. Reports h/o panic attacks. Reports some recent mild panic attacks "but nothing debilitating." Wife reports that pt will have periods of obsessive thoughts and will become hyper-focused on certain things. She reports that he will drink sodas excessively and fixate on certain TV  shows.   He reports that he has been sleeping well. He and his wife estimate that he is sleeping 6-8 hours a night. They report in the past he has slept excessively with depression (12-14 hours). He reports some slight decrease in energy and motivation with being out of work during the pandemic. He reports that he has gained weight during the pandemic and is now intentionally trying to lose weight. He reports that his concentration has been ok "once I get going." Denies current or past SI.  They deny any recent manic s/s to include impulsivity or excessive energy. They report that he has not had any manic s/s in years.   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  Saw a psychiatrist for years in [place].   He reports that they are participating in [facility]. He and his wife have been married almost 30 years. Wife reports that pt's mood d/o seemed to be triggered by substance use.   Now working for [employer] and has worked in Press [employer].    Review of Systems:  Review of Systems  Cardiovascular:       Increased BP recently.  Endocrine:       Told he was pre-diabetic.   Musculoskeletal: Negative for gait problem.  Neurological: Negative for tremors.       Denies involuntary movements  Psychiatric/Behavioral:  Please refer to HPI    Medications: I have reviewed the patient's current medications.  Current Outpatient Medications  Medication Sig Dispense Refill  . AMBULATORY NON FORMULARY MEDICATION Continuous positive airway pressure (CPAP) machine set at autotitration of H2O pressure, with all supplemental supplies as needed. 1 each 0  . colestipol (COLESTID) 1 g tablet Take 1 tablet by mouth once daily 90 tablet 4  . dicyclomine (BENTYL) 20 MG tablet TAKE 1 TABLET BY MOUTH THREE TIMES DAILY 90 tablet 2  .  ibuprofen (ADVIL) 800 MG tablet Take 1 tablet (800 mg total) by mouth every 8 (eight) hours as needed. 60 tablet 2  . loratadine (CLARITIN) 10 MG tablet Take 10 mg by mouth daily.    [name] Kitchen OLANZapine (ZYPREXA) 10 MG tablet Take 1 tablet (10 mg total) by mouth at bedtime. 90 tablet 1  . ondansetron (ZOFRAN) 8 MG tablet Take 1 tablet (8 mg total) by mouth every 8 (eight) hours as needed for nausea or vomiting. 20 tablet 5  . pantoprazole (PROTONIX) 40 MG tablet TAKE 1 TABLET BY MOUTH TWICE DAILY 180 tablet 3  . sertraline (ZOLOFT) 100 MG tablet Take 1 tablet (100 mg total) by mouth daily. 90 tablet 1  . sodium bicarbonate 650 MG tablet TAKE 1 TABLET BY MOUTH 4 TIMES DAILY 360 tablet 4  . ipratropium (ATROVENT) 0.06 % nasal spray Place 2 sprays into both nostrils 4 (four) times daily. (Patient not taking: Reported on 04/03/2019) 15 mL 1  . tamsulosin (FLOMAX) 0.4 MG CAPS capsule Take 1 capsule by mouth once daily (Patient not taking: Reported on 04/03/2019) 90 capsule 0   No current facility-administered medications for this visit.     Medication Side Effects: Other: Increased appetite  Allergies:  Allergies  Allergen Reactions  . Fentanyl Other (See Comments)    Avoid opiates  . Other     04/06/18 Patient is a recovering narcotic abuser and, per wife, is NEVER to receive controlled substances (e.g. Narcotics, sedatives, etc.)  . Aspirin Other (See Comments)    Per pt  . Contrast Media [Iodinated Diagnostic Agents] Palpitations  . Nitroglycerin Nausea And Vomiting    Past Medical History:  Diagnosis Date  . Arthritis    lower back  . Bipolar disorder ([place])   . GERD (gastroesophageal reflux disease)   . Hyperlipidemia   . Hypertension   . Substance abuse in remission ([place]) 04/06/2018   AVOID OPIATES!    Family History  Problem Relation Age of Onset  . Cancer Mother        breast  . Depression Mother   . Hyperlipidemia Mother   . Alcohol abuse Father   . Cancer Father        skin   . Hyperlipidemia Father   . Alzheimer's disease Father   . Cancer Maternal Grandfather   . Cancer Paternal Grandmother     Social History   Socioeconomic History  . Marital status: Married    Spouse name: Dawn  . Number of children: 1  . Years of education: 82  . Highest education level: Not on file  Occupational History  . Occupation: part time    Comment: home [employer]  . Financial resource strain: Not hard at all  . Food insecurity    Worry: Never true    Inability: Never true  . Transportation needs    Medical: No    Non-medical: No  Tobacco Use  . Smoking status: Former Research scientist (life sciences)  .  Smokeless tobacco: Never Used  Substance and Sexual Activity  . Alcohol use: No  . Drug use: No    Comment: 04/06/2018 Patient's wife wanted [place] to be very aware that her husband is a recovering narcotic abuser  . Sexual activity: Yes  Lifestyle  . Physical activity    Days per week: 0 days    Minutes per session: 0 min  . Stress: Not at all  Relationships  . Social Herbalist on phone: Twice a week    Gets together: Once a week    Attends religious service: More than 4 times per year    Active member of club or organization: Yes    Attends meetings of clubs or organizations: More than 4 times per year    Relationship status: Married  . Intimate partner violence    Fear of current or ex partner: No    Emotionally abused: No    Physically abused: No    Forced sexual activity: No  Other Topics Concern  . Not on file  Social History Narrative   Works part time for [employer] Does not exercise due to back issues in the past and strenous job. Daily caffeine use 2-3 drinks a day.    Past Medical History, Surgical history, Social history, and Family history were reviewed and updated as appropriate.   Please see review of systems for further details on the patient's review from today.   Objective:   Physical Exam:  There were no vitals taken for this  visit.  Physical Exam Neurological:     Mental Status: He is alert and oriented to person, place, and time.     Cranial Nerves: No dysarthria.  Psychiatric:        Attention and Perception: Attention normal.        Mood and Affect: Mood normal.        Speech: Speech normal.        Behavior: Behavior is cooperative.        Thought Content: Thought content normal. Thought content is not paranoid or delusional. Thought content does not include homicidal or suicidal ideation. Thought content does not include homicidal or suicidal plan.        Cognition and Memory: Cognition and memory normal.        Judgment: Judgment normal.     Lab Review:     Component Value Date/Time   NA 138 03/17/2019 1224   K 4.6 03/17/2019 1224   CL 103 03/17/2019 1224   CO2 28 03/17/2019 1224   GLUCOSE 92 03/17/2019 1224   BUN 27 (H) 03/17/2019 1224   CREATININE 1.20 03/17/2019 1224   CALCIUM 9.8 03/17/2019 1224   PROT 7.0 03/17/2019 1224   ALBUMIN 4.4 11/23/2016 1537   AST 33 03/17/2019 1224   ALT 55 (H) 03/17/2019 1224   ALKPHOS 59 11/23/2016 1537   BILITOT 0.5 03/17/2019 1224   GFRNONAA 66 03/17/2019 1224   GFRAA 77 03/17/2019 1224       Component Value Date/Time   WBC 6.2 03/17/2019 1224   RBC 5.13 03/17/2019 1224   HGB 14.4 03/17/2019 1224   HCT 42.9 03/17/2019 1224   PLT 242 03/17/2019 1224   MCV 83.6 03/17/2019 1224   MCH 28.1 03/17/2019 1224   MCHC 33.6 03/17/2019 1224   RDW 14.1 03/17/2019 1224   LYMPHSABS 1,172 03/17/2019 1224   EOSABS 112 03/17/2019 1224   BASOSABS 31 03/17/2019 1224    No results found  for: POCLITH, LITHIUM   No results found for: PHENYTOIN, PHENOBARB, VALPROATE, CBMZ   .res Assessment: Plan:   Discussed potential metabolic side effects of olanzapine and agree with plan to start lifestyle changes to improve cholesterol and manage weight.  Discussed that metformin is occasionally used off label for antipsychotic induced insulin resistance/weight gain and  discussed potential benefits, risks, and side effects.  Patient and his wife report that he already has frequent diarrhea due to IBS and would prefer to start with lifestyle modifications first.  Discussed that if patient has difficulty controlling glucose levels, cholesterol, or weight despite lifestyle changes and/or his PCP recommends considering alternative, this provider would be glad to discuss alternative treatment options to olanzapine.  Patient and his wife agree that benefits of olanzapine are currently outweighing side effects.  Will therefore continue olanzapine 10 mg at bedtime for mood signs and symptoms.  Will continue sertraline 100 mg daily for anxiety and depression.  Patient to follow-up in 4 months or sooner if clinically indicated. Patient advised to contact office with any questions, adverse effects, or acute worsening in signs and symptoms.  Amani was seen today for follow-up.  Diagnoses and all orders for this visit:  Bipolar affective disorder, remission status unspecified (HCC) -     OLANZapine (ZYPREXA) 10 MG tablet; Take 1 tablet (10 mg total) by mouth at bedtime. -     sertraline (ZOLOFT) 100 MG tablet; Take 1 tablet (100 mg total) by mouth daily.  Generalized anxiety disorder -     sertraline (ZOLOFT) 100 MG tablet; Take 1 tablet (100 mg total) by mouth daily.     Please see After Visit Summary for patient specific instructions.  Future Appointments  Date Time Provider Leon  08/07/2019 11:00 AM Thayer Headings, PMHNP [facility] None  08/28/2019  2:00 PM [facility] None    No orders of the defined types were placed in this encounter.     -------------------------------

## 2019-04-06 ENCOUNTER — Encounter: Payer: Self-pay | Admitting: Psychiatry

## 2019-04-18 ENCOUNTER — Other Ambulatory Visit: Payer: Self-pay | Admitting: Physician Assistant

## 2019-05-07 ENCOUNTER — Other Ambulatory Visit: Payer: Self-pay | Admitting: Family Medicine

## 2019-05-20 ENCOUNTER — Other Ambulatory Visit: Payer: Self-pay | Admitting: Physician Assistant

## 2019-06-09 ENCOUNTER — Other Ambulatory Visit: Payer: Self-pay | Admitting: Family Medicine

## 2019-06-09 ENCOUNTER — Other Ambulatory Visit: Payer: Self-pay | Admitting: Physician Assistant

## 2019-06-12 ENCOUNTER — Telehealth: Payer: Self-pay | Admitting: Neurology

## 2019-06-12 NOTE — Telephone Encounter (Signed)
Cologuard order faxed to [phone] with confirmation received. They will contact the patient directly.   

## 2019-06-13 ENCOUNTER — Ambulatory Visit (INDEPENDENT_AMBULATORY_CARE_PROVIDER_SITE_OTHER): Payer: Medicare HMO

## 2019-06-13 ENCOUNTER — Encounter: Payer: Self-pay | Admitting: Family Medicine

## 2019-06-13 ENCOUNTER — Other Ambulatory Visit: Payer: Self-pay

## 2019-06-13 ENCOUNTER — Ambulatory Visit (INDEPENDENT_AMBULATORY_CARE_PROVIDER_SITE_OTHER): Payer: Medicare HMO | Admitting: Family Medicine

## 2019-06-13 VITALS — BP 177/97 | HR 84 | Wt 242.0 lb

## 2019-06-13 DIAGNOSIS — S90121A Contusion of right lesser toe(s) without damage to nail, initial encounter: Secondary | ICD-10-CM | POA: Diagnosis not present

## 2019-06-13 DIAGNOSIS — M79671 Pain in right foot: Secondary | ICD-10-CM

## 2019-06-13 DIAGNOSIS — M25571 Pain in right ankle and joints of right foot: Secondary | ICD-10-CM | POA: Diagnosis not present

## 2019-06-13 DIAGNOSIS — Z23 Encounter for immunization: Secondary | ICD-10-CM | POA: Diagnosis not present

## 2019-06-13 NOTE — Progress Notes (Signed)
Jake [name] is a 58 y.o. male who presents to [facility] today for right foot and ankle pain.   On Thursday, October 1 Jake [name] was in his normal state of health.  His wife called for health after falling on a treadmill and he ran to helper.  He thinks he rolled his ankle during the run and has had foot and ankle pain since.  He notes pain at the lateral aspect of the ankle and into the dorsal forefoot and lateral midfoot to lateral hindfoot.  He notes bruising and swelling.  He is tried over-the-counter medications for pain along with some rest ice and elevation which is helped a little.  He notes continued pain since then.   ROS:  As above  Exam:  BP (!) 177/97   Pulse 84   Wt 242 lb (109.8 kg)   BMI 33.75 kg/m  Wt Readings from Last 5 Encounters:  06/13/19 242 lb (109.8 kg)  03/17/19 251 lb (113.9 kg)  01/16/19 246 lb (111.6 kg)  09/05/18 242 lb (109.8 kg)  09/02/18 245 lb (111.1 kg)   General: Well Developed, well nourished, and in no acute distress.  Neuro/Psych: Alert and oriented x3, extra-ocular muscles intact, able to move all 4 extremities, sensation grossly intact. Skin: Warm and dry, no rashes noted.  Respiratory: Not using accessory muscles, speaking in full sentences, trachea midline.  Cardiovascular: Pulses palpable, no extremity edema. Abdomen: Does not appear distended. MSK:  Right ankle: Slightly swollen at lateral ankle.  Otherwise normal-appearing Tender palpation lateral malleolus. Somewhat limited ankle motion with guarding.   Ankle stability and strength not tested due to guarding and pain.  Right foot: Slightly swollen across the dorsal midfoot. Tender palpation along dorsal midfoot mildly.  Mildly tender proximal fifth metatarsal and along the lateral calcaneus.  Pulses cap refill and sensation are intact distally.    Lab and Radiology Results Xray foot and ankle right images obtained today  personally and independently reviewed. No fractures foot or ankle.  Minimal degenerative changes.  Soft tissue swelling present. Await formal radiology review     Assessment and Plan: 58 y.o. male with foot and ankle pain after inversion injury.  Likely ankle sprain.  Possible foot sprain as well.  Fracture unlikely however radiology review of x-rays is still pending.  Plan for cam walker boot with home exercise program and plan to advance to ASO brace in near future.  Will use diclofenac gel and over-the-counter medications as well for pain control.  Recheck in 3 weeks.  Return sooner if needed.  Flu vaccine given today prior to discharge. PDMP not reviewed this encounter. Orders Placed This Encounter  Procedures  . DG Foot Complete Right    Standing Status:   Future    Number of Occurrences:   1    Standing Expiration Date:   08/12/2020    Order Specific Question:   Reason for Exam (SYMPTOM  OR DIAGNOSIS REQUIRED)    Answer:   eval foot and ankle pain after inversion    Order Specific Question:   Preferred imaging location?    Answer:   [facility]    Order Specific Question:   Radiology Contrast Protocol - do NOT remove file path    Answer:   \\[email or web address]s.pdf  . DG Ankle Complete Right    Standing Status:   Future    Number of Occurrences:   1    Standing Expiration Date:   08/12/2020  Order Specific Question:   Reason for Exam (SYMPTOM  OR DIAGNOSIS REQUIRED)    Answer:   eval foot and ankle pain after inversion    Order Specific Question:   Preferred imaging location?    Answer:   [facility]    Order Specific Question:   Radiology Contrast Protocol - do NOT remove file path    Answer:   \\[email or web address]\Radiant\DXFluoroContrastProtocols.pdf  . Flu Vaccine QUAD 36+ mos IM   No orders of the defined types were placed in this encounter.   Historical information moved to improve visibility of documentation.   Past Medical History:  Diagnosis Date  . Arthritis    lower back  . Bipolar disorder ([place])   . GERD (gastroesophageal reflux disease)   . Hyperlipidemia   . Hypertension   . Substance abuse in remission ([place]) 04/06/2018   AVOID OPIATES!   Past Surgical History:  Procedure Laterality Date  . ANKLE FRACTURE SURGERY Left   . APPENDECTOMY    . CERVICAL FUSION    . CHOLECYSTECTOMY    . COLON SURGERY    . HERNIA REPAIR     Social History   Tobacco Use  . Smoking status: Former Research scientist (life sciences)  . Smokeless tobacco: Never Used  Substance Use Topics  . Alcohol use: No   family history includes Alcohol abuse in his father; Alzheimer's disease in his father; Cancer in his father, maternal grandfather, mother, and paternal grandmother; Depression in his mother; Hyperlipidemia in his father and mother.  Medications: Current Outpatient Medications  Medication Sig Dispense Refill  . AMBULATORY NON FORMULARY MEDICATION Continuous positive airway pressure (CPAP) machine set at autotitration of H2O pressure, with all supplemental supplies as needed. 1 each 0  . colestipol (COLESTID) 1 g tablet Take 1 tablet by mouth once daily 90 tablet 4  . dicyclomine (BENTYL) 20 MG tablet TAKE 1 TABLET BY MOUTH THREE TIMES DAILY 90 tablet 0  . ibuprofen (ADVIL) 800 MG tablet Take 1 tablet (800 mg total) by mouth every 8 (eight) hours as needed. 60 tablet 2  . ipratropium (ATROVENT) 0.06 % nasal spray Place 2 sprays into both nostrils 4 (four) times daily. (Patient not taking: Reported on 04/03/2019) 15 mL 1  . loratadine (CLARITIN) 10 MG tablet Take 10 mg by mouth daily.    [name] Kitchen OLANZapine (ZYPREXA) 10 MG tablet Take 1 tablet (10 mg total) by mouth at bedtime. 90 tablet 1  . ondansetron (ZOFRAN) 8 MG tablet TAKE 1 TABLET BY MOUTH EVERY 8 HOURS AS NEEDED FOR NAUSEA FOR VOMITING 20 tablet 5  . pantoprazole (PROTONIX) 40 MG tablet TAKE 1 TABLET BY MOUTH TWICE DAILY 180 tablet 3  . sertraline (ZOLOFT) 100 MG tablet Take 1  tablet (100 mg total) by mouth daily. 90 tablet 1  . sodium bicarbonate 650 MG tablet Take 1 tablet by mouth 4 times daily 360 tablet 0  . tamsulosin (FLOMAX) 0.4 MG CAPS capsule Take 1 capsule by mouth once daily 90 capsule 0   No current facility-administered medications for this visit.    Allergies  Allergen Reactions  . Fentanyl Other (See Comments)    Avoid opiates  . Other     04/06/18 Patient is a recovering narcotic abuser and, per wife, is NEVER to receive controlled substances (e.g. Narcotics, sedatives, etc.)  . Aspirin Other (See Comments)    Per pt  . Contrast Media [Iodinated Diagnostic Agents] Palpitations  . Nitroglycerin Nausea And Vomiting      Discussed warning signs or  symptoms. Please see discharge instructions. Patient expresses understanding.

## 2019-06-13 NOTE — Patient Instructions (Addendum)
Thank you for coming in today. I think you probably have a foot and or ankle sprain.  Do the ankle range of motion.  Ok to add restance bands when able.  Use topical diclofenac gel for pain as needed up to 4x daily.  OK to use tylenol for pain.  Recheck in 3 weeks. Use cam walker foot and then lace up ankle brace for pain.   Ankle Sprain, Phase I Rehab An ankle sprain is an injury to the ligaments of your ankle. Ankle sprains cause stiffness, loss of motion, and loss of strength. Ask your health care provider which exercises are safe for you. Do exercises exactly as told by your health care provider and adjust them as directed. It is normal to feel mild stretching, pulling, tightness, or discomfort as you do these exercises. Stop right away if you feel sudden pain or your pain gets worse. Do not begin these exercises until told by your health care provider. Stretching and range-of-motion exercises These exercises warm up your muscles and joints and improve the movement and flexibility of your lower leg and ankle. These exercises also help to relieve pain and stiffness. Gastroc and soleus stretch This exercise is also called a calf stretch. It stretches the muscles in the back of the lower leg. These muscles are the gastrocnemius, or gastroc, and the soleus. 1. Sit on the floor with your left / right leg extended. 2. Loop a belt or towel around the ball of your left / right foot. The ball of your foot is on the walking surface, right under your toes. 3. Keep your left / right ankle and foot relaxed and keep your knee straight while you use the belt or towel to pull your foot toward you. You should feel a gentle stretch behind your calf or knee in your gastroc muscle. 4. Hold this position for __________ seconds, then release to the starting position. 5. Repeat the exercise with your knee bent. You can put a pillow or a rolled bath towel under your knee to support it. You should feel a stretch deep  in your calf in the soleus muscle or at your Achilles tendon. Repeat __________ times. Complete this exercise __________ times a day. Ankle alphabet  1. Sit with your left / right leg supported at the lower leg. ? Do not rest your foot on anything. ? Make sure your foot has room to move freely. 2. Think of your left / right foot as a paintbrush. ? Move your foot to trace each letter of the alphabet in the air. Keep your hip and knee still while you trace. ? Make the letters as large as you can without feeling discomfort. 3. Trace every letter from A to Z. Repeat __________ times. Complete this exercise __________ times a day. Strengthening exercises These exercises build strength and endurance in your ankle and lower leg. Endurance is the ability to use your muscles for a long time, even after they get tired. Ankle dorsiflexion  1. Secure a rubber exercise band or tube to an object, such as a table leg, that will stay still when the band is pulled. Secure the other end around your left / right foot. 2. Sit on the floor facing the object, with your left / right leg extended. The band or tube should be slightly tense when your foot is relaxed. 3. Slowly bring your foot toward you, bringing the top of your foot toward your shin (dorsiflexion), and pulling the band tighter. 4.  Hold this position for __________ seconds. 5. Slowly return your foot to the starting position. Repeat __________ times. Complete this exercise __________ times a day. Ankle plantar flexion  1. Sit on the floor with your left / right leg extended. 2. Loop a rubber exercise tube or band around the ball of your left / right foot. The ball of your foot is on the walking surface, right under your toes. ? Hold the ends of the band or tube in your hands. ? The band or tube should be slightly tense when your foot is relaxed. 3. Slowly point your foot and toes downward to tilt the top of your foot away from your shin (plantar  flexion). 4. Hold this position for __________ seconds. 5. Slowly return your foot to the starting position. Repeat __________ times. Complete this exercise __________ times a day. Ankle eversion 1. Sit on the floor with your legs straight out in front of you. 2. Loop a rubber exercise band or tube around the ball of your left / right foot. The ball of your foot is on the walking surface, right under your toes. ? Hold the ends of the band in your hands, or secure the band to a stable object. ? The band or tube should be slightly tense when your foot is relaxed. 3. Slowly push your foot outward, away from your other leg (eversion). 4. Hold this position for __________ seconds. 5. Slowly return your foot to the starting position. Repeat __________ times. Complete this exercise __________ times a day. This information is not intended to replace advice given to you by your health care provider. Make sure you discuss any questions you have with your health care provider. Document Released: 03/25/2005 Document Revised: 12/13/2018 Document Reviewed: 06/06/2018 Elsevier Patient Education  2020 [employer].

## 2019-06-14 DIAGNOSIS — G4733 Obstructive sleep apnea (adult) (pediatric): Secondary | ICD-10-CM | POA: Diagnosis not present

## 2019-07-04 ENCOUNTER — Ambulatory Visit: Payer: Medicare HMO | Admitting: Family Medicine

## 2019-07-24 ENCOUNTER — Other Ambulatory Visit: Payer: Self-pay | Admitting: Physician Assistant

## 2019-08-07 ENCOUNTER — Ambulatory Visit: Payer: Medicare HMO | Admitting: Psychiatry

## 2019-08-21 NOTE — Progress Notes (Deleted)
Subjective:   Jake [name] is a 58 y.o. male who presents for Medicare Annual/Subsequent preventive examination.  Review of Systems:  No ROS.  Medicare Wellness Virtual Visit.  Visual/audio telehealth visit, UTA vital signs.   See social history for additional risk factors.       Sleep patterns:  Home Safety/Smoke Alarms: Feels safe in home. Smoke alarms in place.  Living environment; Seat Belt Safety/Bike Helmet: Wears seat belt.  Male:   CCS-     PSA- UTD Lab Results  Component Value Date   PSA 1.2 08/22/2018        Objective:    Vitals: There were no vitals taken for this visit.  There is no height or weight on file to calculate BMI.  Advanced Directives 08/22/2018 03/16/2018 01/18/2018 06/26/2016 05/04/2016  Does Patient Have a Medical Advance Directive? No No No No No  Would patient like information on creating a medical advance directive? No - Patient declined No - Patient declined No - Patient declined No - patient declined information No - patient declined information    Tobacco Social History   Tobacco Use  Smoking Status Former Smoker  Smokeless Tobacco Never Used     Counseling given: Not Answered   Clinical Intake:                       Past Medical History:  Diagnosis Date  . Arthritis    lower back  . Bipolar disorder ([place])   . GERD (gastroesophageal reflux disease)   . Hyperlipidemia   . Hypertension   . 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   Past Surgical History:  Procedure Laterality Date  . ANKLE FRACTURE SURGERY Left   . APPENDECTOMY    . CERVICAL FUSION    . CHOLECYSTECTOMY    . COLON SURGERY    . HERNIA REPAIR     Family History  Problem Relation Age of Onset  . Cancer Mother        breast  . Depression Mother   . Hyperlipidemia Mother   . Alcohol abuse Father   . Cancer Father        skin  . Hyperlipidemia Father   . Alzheimer's disease Father   . Cancer Maternal  Grandfather   . Cancer Paternal Grandmother    Social History   Socioeconomic History  . Marital status: Married    Spouse name: Dawn  . Number of children: 1  . Years of education: 39  . Highest education level: Not on file  Occupational History  . Occupation: part time    Comment: [employer]  Tobacco Use  . Smoking status: Former Research scientist (life sciences)  . Smokeless tobacco: Never Used  Substance and Sexual Activity  . Alcohol use: No  . Drug use: No    Comment: 04/06/2018 Patient's wife wanted [place] to be very aware that her husband is a recovering narcotic abuser  . Sexual activity: Yes  Other Topics Concern  . Not on file  Social History Narrative   Works part time for [employer] Does not exercise due to back issues in the past and strenous job. Daily caffeine use 2-3 drinks a day.   Social Determinants of Health   Financial Resource Strain: Low Risk   . Difficulty of Paying Living Expenses: Not hard at all  Food Insecurity: No Food Insecurity  . Worried About Charity fundraiser in the Last Year: Never true  .  Ran Out of Food in the Last Year: Never true  Transportation Needs: No Transportation Needs  . Lack of Transportation (Medical): No  . Lack of Transportation (Non-Medical): No  Physical Activity: Inactive  . Days of Exercise per Week: 0 days  . Minutes of Exercise per Session: 0 min  Stress: No Stress Concern Present  . Feeling of Stress : Not at all  Social Connections: Not Isolated  . Frequency of Communication with Friends and Family: Twice a week  . Frequency of Social Gatherings with Friends and Family: Once a week  . Attends Religious Services: More than 4 times per year  . Active Member of Clubs or Organizations: Yes  . Attends Archivist Meetings: More than 4 times per year  . Marital Status: Married    Outpatient Encounter Medications as of 08/28/2019  Medication Sig  . AMBULATORY NON FORMULARY MEDICATION Continuous positive airway pressure (CPAP)  machine set at autotitration of H2O pressure, with all supplemental supplies as needed.  . colestipol (COLESTID) 1 g tablet Take 1 tablet by mouth once daily  . dicyclomine (BENTYL) 20 MG tablet TAKE 1 TABLET BY MOUTH THREE TIMES DAILY  . ibuprofen (ADVIL) 800 MG tablet Take 1 tablet (800 mg total) by mouth every 8 (eight) hours as needed.  [name] Kitchen ipratropium (ATROVENT) 0.06 % nasal spray Place 2 sprays into both nostrils 4 (four) times daily. (Patient not taking: Reported on 04/03/2019)  . loratadine (CLARITIN) 10 MG tablet Take 10 mg by mouth daily.  [name] Kitchen OLANZapine (ZYPREXA) 10 MG tablet Take 1 tablet (10 mg total) by mouth at bedtime.  . ondansetron (ZOFRAN) 8 MG tablet TAKE 1 TABLET BY MOUTH EVERY 8 HOURS AS NEEDED FOR NAUSEA FOR VOMITING  . pantoprazole (PROTONIX) 40 MG tablet TAKE 1 TABLET BY MOUTH TWICE DAILY  . sertraline (ZOLOFT) 100 MG tablet Take 1 tablet (100 mg total) by mouth daily.  . sodium bicarbonate 650 MG tablet Take 1 tablet by mouth 4 times daily  . tamsulosin (FLOMAX) 0.4 MG CAPS capsule Take 1 capsule by mouth once daily   No facility-administered encounter medications on file as of 08/28/2019.    Activities of Daily Living In your present state of health, do you have any difficulty performing the following activities: 08/22/2018  Hearing? N  Vision? N  Difficulty concentrating or making decisions? N  Walking or climbing stairs? N  Dressing or bathing? N  Doing errands, shopping? N  Preparing Food and eating ? N  Using the Toilet? N  In the past six months, have you accidently leaked urine? N  Do you have problems with loss of bowel control? N  Managing your Medications? N  Managing your Finances? N  Housekeeping or managing your Housekeeping? N  Some recent data might be hidden    Patient Care Team: Lavada Mesi as PCP - General (Family Medicine) Karie Chimera, PA-C as Referring Physician (Pulmonary Disease)   Assessment:   This is a routine wellness  examination for Jake [name].Physical assessment deferred to PCP.   Exercise Activities and Dietary recommendations   Diet  Breakfast: Lunch:  Dinner:       Goals    . Exercise 3x per week (30 min per time)     Exercise at least 3 times a week for 30 minutes at a time. Would like to loose weight in the coming year as well       Fall Risk Fall Risk  08/22/2018 02/16/2018  Falls in the  past year? 0 No  Follow up Falls prevention discussed -   Is the patient's home free of loose throw rugs in walkways, pet beds, electrical cords, etc?   {Blank single:19197::"yes","no"}      Grab bars in the bathroom? {Blank single:19197::"yes","no"}      Handrails on the stairs?   {Blank single:19197::"yes","no"}      Adequate lighting?   {Blank single:19197::"yes","no"}   Depression Screen PHQ 2/9 Scores 08/22/2018 02/16/2018 06/09/2017  PHQ - 2 Score 1 0 2  PHQ- 9 Score - - 6    Cognitive Function     6CIT Screen 08/22/2018  What Year? 0 points  What month? 0 points  What time? 0 points  Count back from 20 0 points  Months in reverse 0 points  Repeat phrase 2 points  Total Score 2    Immunization History  Administered Date(s) Administered  . Influenza,inj,Quad PF,6+ Mos 07/16/2014, 07/21/2017, 08/22/2018, 06/13/2019  . Influenza-Unspecified 07/16/2014, 06/14/2016  . Td 04/15/2015  . Tdap 10/10/2009    Screening Tests Health Maintenance  Topic Date Due  . HIV Screening  01/06/1976  . COLONOSCOPY  01/06/2011  . TETANUS/TDAP  04/14/2025  . INFLUENZA VACCINE  Completed  . Hepatitis C Screening  Completed        Plan:   ***  I have personally reviewed and noted the following in the patient's chart:   . Medical and social history . Use of alcohol, tobacco or illicit drugs  . Current medications and supplements . Functional ability and status . Nutritional status . Physical activity . Advanced directives . List of other physicians . Hospitalizations, surgeries, and ER  visits in previous 12 months . Vitals . Screenings to include cognitive, depression, and falls . Referrals and appointments  In addition, I have reviewed and discussed with patient certain preventive protocols, quality metrics, and best practice recommendations. A written personalized care plan for preventive services as well as general preventive health recommendations were provided to patient.     Joanne Chars, LPN  [id number]

## 2019-08-28 ENCOUNTER — Ambulatory Visit: Payer: Medicare HMO

## 2019-09-07 ENCOUNTER — Other Ambulatory Visit: Payer: Self-pay | Admitting: Physician Assistant

## 2019-09-07 ENCOUNTER — Other Ambulatory Visit: Payer: Self-pay | Admitting: Neurology

## 2019-09-07 MED ORDER — DICYCLOMINE HCL 20 MG PO TABS: ORAL_TABLET | ORAL | 0 refills | Status: DC

## 2019-09-07 NOTE — Telephone Encounter (Signed)
Needs appointment

## 2019-09-16 ENCOUNTER — Other Ambulatory Visit: Payer: Self-pay | Admitting: Physician Assistant

## 2019-09-26 ENCOUNTER — Encounter: Payer: Self-pay | Admitting: Physician Assistant

## 2019-09-26 ENCOUNTER — Telehealth (INDEPENDENT_AMBULATORY_CARE_PROVIDER_SITE_OTHER): Payer: Medicare HMO | Admitting: Physician Assistant

## 2019-09-26 VITALS — Temp 96.4°F | Ht 71.0 in | Wt 242.0 lb

## 2019-09-26 DIAGNOSIS — J0100 Acute maxillary sinusitis, unspecified: Secondary | ICD-10-CM | POA: Diagnosis not present

## 2019-09-26 DIAGNOSIS — K580 Irritable bowel syndrome with diarrhea: Secondary | ICD-10-CM

## 2019-09-26 DIAGNOSIS — R110 Nausea: Secondary | ICD-10-CM | POA: Diagnosis not present

## 2019-09-26 MED ORDER — DICYCLOMINE HCL 20 MG PO TABS: ORAL_TABLET | ORAL | 3 refills | Status: DC

## 2019-09-26 MED ORDER — AZITHROMYCIN 250 MG PO TABS
ORAL_TABLET | ORAL | 0 refills | Status: DC
Start: 2019-09-26 — End: 2019-10-31

## 2019-09-26 MED ORDER — ONDANSETRON HCL 8 MG PO TABS: 8.0000 mg | ORAL_TABLET | Freq: Three times a day (TID) | ORAL | 2 refills | Status: DC | PRN

## 2019-09-26 MED ORDER — METHYLPREDNISOLONE SODIUM SUCC 125 MG IJ SOLR
125.0000 mg | Freq: Once | INTRAMUSCULAR | Status: AC
  Administered 2019-09-26: 16:00:00 125 mg via INTRAMUSCULAR

## 2019-09-26 NOTE — Progress Notes (Signed)
Patient ID: Jaivin Silerio, male   DOB: [birth year], 59 y.o.   MRN: [id number] .[name] KitchenVirtual Visit via Telephone Note  I connected with Cheral Marker on 09/26/2019 at  2:40 PM EST by telephone and verified that I am speaking with the correct person using two identifiers.  Location: Patient: home Provider: clinic   I discussed the limitations, risks, security and privacy concerns of performing an evaluation and management service by telephone and the availability of in person appointments. I also discussed with the patient that there may be a patient responsible charge related to this service. The patient expressed understanding and agreed to proceed.   History of Present Illness: Patient is a 59 year old male who calls into the clinic with left ear pain, sinus pressure, nasal congestion, headache for last week.  He had some azithromycin partial dose at home.  He did start this.  He is mostly better but still has some sinus and nasal congestion.  His ear pain is significantly improved.  He was not able to finish all doses of azithromycin because he did not have them.  He denies any fever, chills, shortness of breath, loss of smell or taste, cough.  He is also taking some over-the-counter decongestants.  He has a history of 1 or 2 sinus infections a year.  Patient also needs refill on his Zofran for chronic nausea and Bentyl for his irritable bowel syndrome. Note pressure- antibiotics felt better 2 tablets first day  .[name] Kitchen Active Ambulatory Problems    Diagnosis Date Noted  . Low back pain 05/05/2016  . Muscle spasm of back 05/05/2016  . Hyperlipidemia 05/19/2016  . Hypertriglyceridemia 05/19/2016  . Diverticulosis of colon without hemorrhage 05/19/2016  . IBS (irritable bowel syndrome) 05/19/2016  . Essential hypertension, benign 05/19/2016  . GERD (gastroesophageal reflux disease) 05/19/2016  . Allergic rhinitis 05/19/2016  . OSA on CPAP 05/19/2016  . Generalized anxiety disorder 05/19/2016  .  Mood changes 05/19/2016  . Atelectasis of both lungs 11/20/2016  . 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 History of pancreatitis 05/27/2017  . Diverticulitis 05/27/2017  . Elevated fasting glucose 06/10/2017  . Low testosterone 06/10/2017  . Benign prostatic hyperplasia (BPH) with urinary urgency 06/12/2017  . No energy 06/12/2017  . Former smoker 07/25/2017  . Cough 07/25/2017  . 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 Non-restorative sleep 07/25/2017  . Anxiety 10/30/2015  . Chronic nausea 10/30/2015  . Major depressive disorder 10/30/2015  . Migraine 10/30/2015  . Neck pain 10/30/2015  . Occipital neuralgia 10/30/2015  . Substance abuse in remission ([place]) 04/07/2018  . Bipolar disorder ([place]) 07/04/2018  . Hearing difficulty of right ear 01/16/2019  . IFG (impaired fasting glucose) 03/20/2019   Resolved Ambulatory Problems    Diagnosis Date Noted  . Community acquired pneumonia of left lower lobe of lung 11/20/2016  . Impacted cerumen of right ear 01/16/2019   Past Medical History:  Diagnosis Date  . Arthritis   . Hypertension    Reviewed med, allergy, problem list.    Observations/Objective: No acute distress.  Normal breathing.  No cough.  .. Today's Vitals   09/26/19 1108  Temp: (!) 96.4 F (35.8 C)  TempSrc: Oral  Weight: 242 lb (109.8 kg)  Height: 5\' 11"  (1.803 m)   Body mass index is 33.75 kg/m.    Assessment and Plan: [name] KitchenMarland KitchenMerlyn was seen today for ear pain and sinus problem.  Diagnoses and all orders for this visit:  Acute maxillary sinusitis, recurrence not specified -  azithromycin (ZITHROMAX) 250 MG tablet; Take 2 tablets now and then one tablet for 5 days. -     methylPREDNISolone sodium succinate (SOLU-MEDROL) 125 mg/2 mL injection 125 mg  Irritable bowel syndrome with diarrhea -     dicyclomine (BENTYL) 20 MG tablet; TAKE 1 TABLET BY MOUTH THREE TIMES DAILY  Chronic nausea -     ondansetron (ZOFRAN) 8 MG tablet; Take 1 tablet (8 mg  total) by mouth every 8 (eight) hours as needed for nausea or vomiting.   Will send over completed dose of azithromycin. Come in for solumedrol shot. Continue flonase. Rest and hydrate. Follow up as needed.   Refilled zofran/bentyl.    Follow Up Instructions:    I discussed the assessment and treatment plan with the patient. The patient was provided an opportunity to ask questions and all were answered. The patient agreed with the plan and demonstrated an understanding of the instructions.   The patient was advised to call back or seek an in-person evaluation if the symptoms worsen or if the condition fails to improve as anticipated.  I provided 11 minutes of non-face-to-face time during this encounter.   [name], PA-C

## 2019-09-26 NOTE — Progress Notes (Signed)
Left ear ache started last week - Took 250 mg azithromycin over the last 4 days (left over)  Now: pressure behind eyes, across bridge of nose  No sore throat, slight cough, has chronic nausea but no new GI issues

## 2019-10-04 ENCOUNTER — Other Ambulatory Visit: Payer: Self-pay | Admitting: Psychiatry

## 2019-10-04 DIAGNOSIS — F319 Bipolar disorder, unspecified: Secondary | ICD-10-CM

## 2019-10-04 DIAGNOSIS — F411 Generalized anxiety disorder: Secondary | ICD-10-CM

## 2019-10-10 DIAGNOSIS — G4733 Obstructive sleep apnea (adult) (pediatric): Secondary | ICD-10-CM | POA: Diagnosis not present

## 2019-10-23 ENCOUNTER — Encounter: Payer: Self-pay | Admitting: Psychiatry

## 2019-10-23 ENCOUNTER — Ambulatory Visit (INDEPENDENT_AMBULATORY_CARE_PROVIDER_SITE_OTHER): Payer: Medicare HMO | Admitting: Psychiatry

## 2019-10-23 DIAGNOSIS — F319 Bipolar disorder, unspecified: Secondary | ICD-10-CM

## 2019-10-23 DIAGNOSIS — F411 Generalized anxiety disorder: Secondary | ICD-10-CM | POA: Diagnosis not present

## 2019-10-23 DIAGNOSIS — R69 Illness, unspecified: Secondary | ICD-10-CM | POA: Diagnosis not present

## 2019-10-23 MED ORDER — OLANZAPINE 10 MG PO TABS: 10.0000 mg | ORAL_TABLET | Freq: Every day | ORAL | 1 refills | Status: DC

## 2019-10-23 MED ORDER — SERTRALINE HCL 100 MG PO TABS: 150.0000 mg | ORAL_TABLET | Freq: Every day | ORAL | 0 refills | Status: DC

## 2019-10-23 NOTE — Progress Notes (Signed)
Malahki Stjuste [id number] May 28, 1961 59 y.o.  Virtual Visit via Telephone Note  I connected with pt on 10/24/19 at 11:00 AM EST by telephone and verified that I am speaking with the correct person using two identifiers.   I discussed the limitations, risks, security and privacy concerns of performing an evaluation and management service by telephone and the availability of in person appointments. I also discussed with the patient that there may be a patient responsible charge related to this service. The patient expressed understanding and agreed to proceed.   I discussed the assessment and treatment plan with the patient. The patient was provided an opportunity to ask questions and all were answered. The patient agreed with the plan and demonstrated an understanding of the instructions.   The patient was advised to call back or seek an in-person evaluation if the symptoms worsen or if the condition fails to improve as anticipated.  I provided 30 minutes of non-face-to-face time during this encounter.  The patient was located at home.  The provider was located at [place].   Thayer Headings, PMHNP   Subjective:   Patient ID:  Jake [name] is a 59 y.o. (DOB 25-Apr-1961) male.  Chief Complaint:  Chief Complaint  Patient presents with  . Depression  . Anxiety    HPI Jake [name] presents for follow-up of depression and anxiety. He reports that his depression and anxiety have been higher than they have been in the past. He reports that he has had progressively worsening depression over the last few months. Motivation and energy have been lower and he is staying in bed until the middle of the day and is staying in his chair the remainder of the day. He reports that anxiety started getting worse before the depression. He reports that his panic attacks have been more frequent and severe. Panic attacks occurring daily. He worries about having a panic attack and has called out  of work as a result. He also notices worry and anxious thoughts. Notices knots in his stomach with anxiety. Wife reports that his anxiety seems to be simmering most of the time. Denies anger or irritability. Reports that he enjoys his family, church, and small group and has limited interests otherwise. Denies difficulty falling or staying asleep. Typically goes to bed around 11 pm. He wakes up in the morning to take the dog out and then goes back to sleep, sometimes until mid-day. Wife reports that he has some obsessive tendencies and has been compulsively watching TV shows. Appetite has been good. They report that he has been eating mindlessly. Concentration has been impaired and he reports that he has been zoning out while watching TV. Denies SI.   They deny any manic s/s or risky behavior. Wife reports that pt was given dx of Bipolar D/O when he was in active addiction. She denies other manic s/s.   Past Psychiatric Medication Trials: Latuda- most effective and had jaw clinching/thrusting.  Olanzapine Risperdal Seroquel  Abilify Lithium Carbamazepine Topamax Effexor Sertraline  Review of Systems:  Review of Systems  Gastrointestinal: Positive for nausea.  Musculoskeletal: Positive for back pain. Negative for gait problem.  Neurological: Negative for tremors.  Psychiatric/Behavioral:       Please refer to HPI    Medications: I have reviewed the patient's current medications.  Current Outpatient Medications  Medication Sig Dispense Refill  . colestipol (COLESTID) 1 g tablet Take 1 tablet by mouth once daily 90 tablet 4  . dicyclomine (BENTYL) 20 MG tablet TAKE  1 TABLET BY MOUTH THREE TIMES DAILY 90 tablet 3  . loratadine (CLARITIN) 10 MG tablet Take 10 mg by mouth daily.    . Multiple Vitamin (MULTIVITAMIN) tablet Take 1 tablet by mouth daily.    . ondansetron (ZOFRAN) 8 MG tablet Take 1 tablet (8 mg total) by mouth every 8 (eight) hours as needed for nausea or vomiting. 20 tablet 2   . pantoprazole (PROTONIX) 40 MG tablet Take 1 tablet by mouth twice daily 180 tablet 0  . sodium bicarbonate 650 MG tablet Take 1 tablet by mouth 4 times daily 360 tablet 0  . tamsulosin (FLOMAX) 0.4 MG CAPS capsule Take 1 capsule (0.4 mg total) by mouth daily. Needs Appointment 90 capsule 0  . AMBULATORY NON FORMULARY MEDICATION Continuous positive airway pressure (CPAP) machine set at autotitration of H2O pressure, with all supplemental supplies as needed. 1 each 0  . azithromycin (ZITHROMAX) 250 MG tablet Take 2 tablets now and then one tablet for 5 days. (Patient not taking: Reported on 10/23/2019) 7 tablet 0  . ibuprofen (ADVIL) 800 MG tablet Take 1 tablet (800 mg total) by mouth every 8 (eight) hours as needed. (Patient not taking: Reported on 10/23/2019) 60 tablet 2  . ipratropium (ATROVENT) 0.06 % nasal spray Place 2 sprays into both nostrils 4 (four) times daily. (Patient not taking: Reported on 10/23/2019) 15 mL 1  . OLANZapine (ZYPREXA) 10 MG tablet Take 1 tablet (10 mg total) by mouth at bedtime. 90 tablet 1  . sertraline (ZOLOFT) 100 MG tablet Take 1.5 tablets (150 mg total) by mouth daily. 135 tablet 0   No current facility-administered medications for this visit.    Medication Side Effects: None  Allergies:  Allergies  Allergen Reactions  . Fentanyl Other (See Comments)    Avoid opiates  . Other     04/06/18 Patient is a recovering narcotic abuser and, per wife, is NEVER to receive controlled substances (e.g. Narcotics, sedatives, etc.)  . Aspirin Other (See Comments)    Per pt  . Prednisone     Can not tolerate oral but can tolerate shots.   . Contrast Media [Iodinated Diagnostic Agents] Palpitations  . Nitroglycerin Nausea And Vomiting    Past Medical History:  Diagnosis Date  . Arthritis    lower back  . Bipolar disorder ([place])   . GERD (gastroesophageal reflux disease)   . Hyperlipidemia   . Hypertension   . Substance abuse in remission ([place]) 04/06/2018   AVOID  OPIATES!    Family History  Problem Relation Age of Onset  . Cancer Mother        breast  . Depression Mother   . Hyperlipidemia Mother   . Alcohol abuse Father   . Cancer Father        skin  . Hyperlipidemia Father   . Alzheimer's disease Father   . Cancer Maternal Grandfather   . Cancer Paternal Grandmother     Social History   Socioeconomic History  . Marital status: Married    Spouse name: Dawn  . Number of children: 1  . Years of education: 46  . Highest education level: Not on file  Occupational History  . Occupation: part time    Comment: [employer]  Tobacco Use  . Smoking status: Former Research scientist (life sciences)  . Smokeless tobacco: Never Used  Substance and Sexual Activity  . Alcohol use: No  . Drug use: No    Comment: 04/06/2018 Patient's wife wanted [place] to be very aware that  her husband is a recovering narcotic abuser  . Sexual activity: Yes  Other Topics Concern  . Not on file  Social History Narrative   Works part time for [employer] Does not exercise due to back issues in the past and strenous job. Daily caffeine use 2-3 drinks a day.   Social Determinants of Health   Financial Resource Strain:   . Difficulty of Paying Living Expenses: Not on file  Food Insecurity:   . Worried About Charity fundraiser in the Last Year: Not on file  . Ran Out of Food in the Last Year: Not on file  Transportation Needs:   . Lack of Transportation (Medical): Not on file  . Lack of Transportation (Non-Medical): Not on file  Physical Activity:   . Days of Exercise per Week: Not on file  . Minutes of Exercise per Session: Not on file  Stress:   . Feeling of Stress : Not on file  Social Connections:   . Frequency of Communication with Friends and Family: Not on file  . Frequency of Social Gatherings with Friends and Family: Not on file  . Attends Religious Services: Not on file  . Active Member of Clubs or Organizations: Not on file  . Attends Archivist Meetings: Not on  file  . Marital Status: Not on file  Intimate Partner Violence:   . Fear of Current or Ex-Partner: Not on file  . Emotionally Abused: Not on file  . Physically Abused: Not on file  . Sexually Abused: Not on file    Past Medical History, Surgical history, Social history, and Family history were reviewed and updated as appropriate.   Please see review of systems for further details on the patient's review from today.   Objective:   Physical Exam:  There were no vitals taken for this visit.  Physical Exam Neurological:     Mental Status: He is alert and oriented to person, place, and time.     Cranial Nerves: No dysarthria.  Psychiatric:        Attention and Perception: Attention and perception normal.        Mood and Affect: Mood is anxious and depressed.        Speech: Speech normal.        Behavior: Behavior is cooperative.        Thought Content: Thought content normal. Thought content is not paranoid or delusional. Thought content does not include homicidal or suicidal ideation. Thought content does not include homicidal or suicidal plan.        Cognition and Memory: Cognition and memory normal.        Judgment: Judgment normal.     Comments: Insight intact     Lab Review:     Component Value Date/Time   NA 138 03/17/2019 1224   K 4.6 03/17/2019 1224   CL 103 03/17/2019 1224   CO2 28 03/17/2019 1224   GLUCOSE 92 03/17/2019 1224   BUN 27 (H) 03/17/2019 1224   CREATININE 1.20 03/17/2019 1224   CALCIUM 9.8 03/17/2019 1224   PROT 7.0 03/17/2019 1224   ALBUMIN 4.4 11/23/2016 1537   AST 33 03/17/2019 1224   ALT 55 (H) 03/17/2019 1224   ALKPHOS 59 11/23/2016 1537   BILITOT 0.5 03/17/2019 1224   GFRNONAA 66 03/17/2019 1224   GFRAA 77 03/17/2019 1224       Component Value Date/Time   WBC 6.2 03/17/2019 1224   RBC 5.13 03/17/2019 1224  HGB 14.4 03/17/2019 1224   HCT 42.9 03/17/2019 1224   PLT 242 03/17/2019 1224   MCV 83.6 03/17/2019 1224   MCH 28.1 03/17/2019  1224   MCHC 33.6 03/17/2019 1224   RDW 14.1 03/17/2019 1224   LYMPHSABS 1,172 03/17/2019 1224   EOSABS 112 03/17/2019 1224   BASOSABS 31 03/17/2019 1224    No results found for: POCLITH, LITHIUM   No results found for: PHENYTOIN, PHENOBARB, VALPROATE, CBMZ   .res Assessment: Plan:   Patient seen for 30 minutes and time spent counseling patient and his wife regarding potential benefits, risks, and side effects of increasing sertraline to 150 mg daily to improve depression and anxiety.  Reviewed medical record and patient experienced improvement in mood and anxiety signs and symptoms in the past following increase in sertraline.  Also discussed history of mood signs and symptoms and patient's wife reports that patient exhibited changes in mood and behavior during active addiction, and did not exhibit any mood lability or manic signs and symptoms before or after her active addiction.  Discussed that patient may possibly need a mood stabilizer and may consider gradual dose reduction of olanzapine in the future when mood and anxiety signs and symptoms have improved. Patient agrees to increase in sertraline to 150 mg daily. Continue olanzapine 10 mg p.o. QHS. Patient to follow-up in 4 weeks or sooner if clinically indicated. Patient advised to contact office with any questions, adverse effects, or acute worsening in signs and symptoms.  Jake [name] was seen today for depression and anxiety.  Diagnoses and all orders for this visit:  Bipolar affective disorder, remission status unspecified (HCC) -     OLANZapine (ZYPREXA) 10 MG tablet; Take 1 tablet (10 mg total) by mouth at bedtime.  Generalized anxiety disorder -     sertraline (ZOLOFT) 100 MG tablet; Take 1.5 tablets (150 mg total) by mouth daily.    Please see After Visit Summary for patient specific instructions.  No future appointments.  No orders of the defined types were placed in this encounter.      -------------------------------

## 2019-10-24 ENCOUNTER — Other Ambulatory Visit (HOSPITAL_BASED_OUTPATIENT_CLINIC_OR_DEPARTMENT_OTHER): Payer: Medicare HMO

## 2019-10-31 ENCOUNTER — Other Ambulatory Visit: Payer: Self-pay

## 2019-10-31 ENCOUNTER — Encounter: Payer: Self-pay | Admitting: Physician Assistant

## 2019-10-31 ENCOUNTER — Ambulatory Visit (INDEPENDENT_AMBULATORY_CARE_PROVIDER_SITE_OTHER): Payer: Medicare HMO

## 2019-10-31 ENCOUNTER — Ambulatory Visit (INDEPENDENT_AMBULATORY_CARE_PROVIDER_SITE_OTHER): Payer: Medicare HMO | Admitting: Physician Assistant

## 2019-10-31 VITALS — BP 162/89 | HR 86 | Ht 71.0 in | Wt 249.0 lb

## 2019-10-31 DIAGNOSIS — M62830 Muscle spasm of back: Secondary | ICD-10-CM | POA: Diagnosis not present

## 2019-10-31 DIAGNOSIS — I2584 Coronary atherosclerosis due to calcified coronary lesion: Secondary | ICD-10-CM

## 2019-10-31 DIAGNOSIS — R0602 Shortness of breath: Secondary | ICD-10-CM

## 2019-10-31 DIAGNOSIS — I700 Atherosclerosis of aorta: Secondary | ICD-10-CM | POA: Diagnosis not present

## 2019-10-31 DIAGNOSIS — Z87891 Personal history of nicotine dependence: Secondary | ICD-10-CM

## 2019-10-31 DIAGNOSIS — K760 Fatty (change of) liver, not elsewhere classified: Secondary | ICD-10-CM

## 2019-10-31 DIAGNOSIS — M545 Low back pain, unspecified: Secondary | ICD-10-CM

## 2019-10-31 DIAGNOSIS — Z131 Encounter for screening for diabetes mellitus: Secondary | ICD-10-CM

## 2019-10-31 DIAGNOSIS — I2510 Atherosclerotic heart disease of native coronary artery without angina pectoris: Secondary | ICD-10-CM | POA: Diagnosis not present

## 2019-10-31 MED ORDER — IBUPROFEN 800 MG PO TABS: 800.0000 mg | ORAL_TABLET | Freq: Three times a day (TID) | ORAL | 1 refills | Status: DC | PRN

## 2019-10-31 MED ORDER — UMECLIDINIUM-VILANTEROL 62.5-25 MCG/INH IN AEPB: 1.0000 | INHALATION_SPRAY | Freq: Every day | RESPIRATORY_TRACT | 1 refills | Status: DC

## 2019-10-31 NOTE — Patient Instructions (Signed)
Will order Echo to look at heart.  Will get CXR today.  Will refer for lung CT due to smoking history.    COPD and Physical Activity Chronic obstructive pulmonary disease (COPD) is a long-term (chronic) condition that affects the lungs. COPD is a general term that can be used to describe many different lung problems that cause lung swelling (inflammation) and limit airflow, including chronic bronchitis and emphysema. The main symptom of COPD is shortness of breath, which makes it harder to do even simple tasks. This can also make it harder to exercise and be active. Talk with your health care provider about treatments to help you breathe better and actions you can take to prevent breathing problems during physical activity. What are the benefits of exercising with COPD? Exercising regularly is an important part of a healthy lifestyle. You can still exercise and do physical activities even though you have COPD. Exercise and physical activity improve your shortness of breath by increasing blood flow (circulation). This causes your heart to pump more oxygen through your body. Moderate exercise can improve your:  Oxygen use.  Energy level.  Shortness of breath.  Strength in your breathing muscles.  Heart health.  Sleep.  Self-esteem and feelings of self-worth.  Depression, stress, and anxiety levels. Exercise can benefit everyone with COPD. The severity of your disease may affect how hard you can exercise, especially at first, but everyone can benefit. Talk with your health care provider about how much exercise is safe for you, and which activities and exercises are safe for you. What actions can I take to prevent breathing problems during physical activity?  Sign up for a pulmonary rehabilitation program. This type of program may include: ? Education about lung diseases. ? Exercise classes that teach you how to exercise and be more active while improving your breathing. This usually  involves:  Exercise using your lower extremities, such as a stationary bicycle.  About 30 minutes of exercise, 2 to 5 times per week, for 6 to 12 weeks  Strength training, such as push ups or leg lifts. ? Nutrition education. ? Group classes in which you can talk with others who also have COPD and learn ways to manage stress.  If you use an oxygen tank, you should use it while you exercise. Work with your health care provider to adjust your oxygen for your physical activity. Your resting flow rate is different from your flow rate during physical activity.  While you are exercising: ? Take slow breaths. ? Pace yourself and do not try to go too fast. ? Purse your lips while breathing out. Pursing your lips is similar to a kissing or whistling position. ? If doing exercise that uses a quick burst of effort, such as weight lifting:  Breathe in before starting the exercise.  Breathe out during the hardest part of the exercise (such as raising the weights). Where to find support You can find support for exercising with COPD from:  Your health care provider.  A pulmonary rehabilitation program.  Your local health department or community health programs.  Support groups, online or in-person. Your health care provider may be able to recommend support groups. Where to find more information You can find more information about exercising with COPD from:  American Lung Association: ClassInsider.se.  COPD Foundation: https://www.rivera.net/. Contact a health care provider if:  Your symptoms get worse.  You have chest pain.  You have nausea.  You have a fever.  You have trouble talking or  catching your breath.  You want to start a new exercise program or a new activity. Summary  COPD is a general term that can be used to describe many different lung problems that cause lung swelling (inflammation) and limit airflow. This includes chronic bronchitis and emphysema.  Exercise and physical  activity improve your shortness of breath by increasing blood flow (circulation). This causes your heart to provide more oxygen to your body.  Contact your health care provider before starting any exercise program or new activity. Ask your health care provider what exercises and activities are safe for you. This information is not intended to replace advice given to you by your health care provider. Make sure you discuss any questions you have with your health care provider. Document Revised: 12/14/2018 Document Reviewed: 09/16/2017 Elsevier Patient Education  2020 [place].  Chronic Obstructive Pulmonary Disease  Chronic obstructive pulmonary disease (COPD) is a long-term (chronic) condition that affects the lungs. COPD is a general term that can be used to describe many different lung problems that cause lung swelling (inflammation) and limit airflow, including chronic bronchitis and emphysema. If you have COPD, your lung function will probably never return to normal. In most cases, it gets worse over time. However, there are steps you can take to slow the progression of the disease and improve your quality of life. What are the causes? This condition may be caused by:  Smoking. This is the most common cause.  Certain genes passed down through families. What increases the risk? The following factors may make you more likely to develop this condition:  Secondhand smoke from cigarettes, pipes, or cigars.  Exposure to chemicals and other irritants such as fumes and dust in the work environment.  Chronic lung conditions or infections. What are the signs or symptoms? Symptoms of this condition include:  Shortness of breath, especially during physical activity.  Chronic cough with a large amount of thick mucus. Sometimes the cough may not have any mucus (dry cough).  Wheezing.  Rapid breaths.  Gray or bluish discoloration (cyanosis) of the skin, especially in your fingers, toes,  or lips.  Feeling tired (fatigue).  Weight loss.  Chest tightness.  Frequent infections.  Episodes when breathing symptoms become much worse (exacerbations).  Swelling in the ankles, feet, or legs. This may occur in later stages of the disease. How is this diagnosed? This condition is diagnosed based on:  Your medical history.  A physical exam. You may also have tests, including:  Lung (pulmonary) function tests. This may include a spirometry test, which measures your ability to exhale properly.  Chest X-ray.  CT scan.  Blood tests. How is this treated? This condition may be treated with:  Medicines. These may include inhaled rescue medicines to treat acute exacerbations as well as long-term, or maintenance, medicines to prevent flare-ups of COPD. ? Bronchodilators help treat COPD by dilating the airways to allow increased airflow and make your breathing more comfortable. ? Steroids can reduce airway inflammation and help prevent exacerbations.  Smoking cessation. If you smoke, your health care provider may ask you to quit, and may also recommend therapy or replacement products to help you quit.  Pulmonary rehabilitation. This may involve working with a team of health care providers and specialists, such as respiratory, occupational, and physical therapists.  Exercise and physical activity. These are beneficial for nearly all people with COPD.  Nutrition therapy to gain weight, if you are underweight.  Oxygen. Supplemental oxygen therapy is only helpful if  you have a low oxygen level in your blood (hypoxemia).  Lung surgery or transplant.  Palliative care. This is to help people with COPD feel comfortable when treatment is no longer working. Follow these instructions at home: Medicines  Take over-the-counter and prescription medicines (inhaled or pills) only as told by your health care provider.  Talk to your health care provider before taking any cough or allergy  medicines. You may need to avoid certain medicines that dry out your airways. Lifestyle  If you are a smoker, the most important thing that you can do is to stop smoking. Do not use any products that contain nicotine or tobacco, such as cigarettes and e-cigarettes. If you need help quitting, ask your health care provider. Continuing to smoke will cause the disease to progress faster.  Avoid exposure to things that irritate your lungs, such as smoke, chemicals, and fumes.  Stay active, but balance activity with periods of rest. Exercise and physical activity will help you maintain your ability to do things you want to do.  Learn and use relaxation techniques to manage stress and to control your breathing.  Get the right amount of sleep and get quality sleep. Most adults need 7 or more hours per night.  Eat healthy foods. Eating smaller, more frequent meals and resting before meals may help you maintain your strength. Controlled breathing Learn and use controlled breathing techniques as directed by your health care provider. Controlled breathing techniques include:  Pursed lip breathing. Start by breathing in (inhaling) through your nose for 1 second. Then, purse your lips as if you were going to whistle and breathe out (exhale) through the pursed lips for 2 seconds.  Diaphragmatic breathing. Start by putting one hand on your abdomen just above your waist. Inhale slowly through your nose. The hand on your abdomen should move out. Then purse your lips and exhale slowly. You should be able to feel the hand on your abdomen moving in as you exhale. Controlled coughing Learn and use controlled coughing to clear mucus from your lungs. Controlled coughing is a series of short, progressive coughs. The steps of controlled coughing are: 1. Lean your head slightly forward. 2. Breathe in deeply using diaphragmatic breathing. 3. Try to hold your breath for 3 seconds. 4. Keep your mouth slightly open while  coughing twice. 5. Spit any mucus out into a tissue. 6. Rest and repeat the steps once or twice as needed. General instructions  Make sure you receive all the vaccines that your health care provider recommends, especially the pneumococcal and influenza vaccines. Preventing infection and hospitalization is very important when you have COPD.  Use oxygen therapy and pulmonary rehabilitation if directed to by your health care provider. If you require home oxygen therapy, ask your health care provider whether you should purchase a pulse oximeter to measure your oxygen level at home.  Work with your health care provider to develop a COPD action plan. This will help you know what steps to take if your condition gets worse.  Keep other chronic health conditions under control as told by your health care provider.  Avoid extreme temperature and humidity changes.  Avoid contact with people who have an illness that spreads from person to person (is contagious), such as viral infections or pneumonia.  Keep all follow-up visits as told by your health care provider. This is important. Contact a health care provider if:  You are coughing up more mucus than usual.  There is a change in the  color or thickness of your mucus.  Your breathing is more labored than usual.  Your breathing is faster than usual.  You have difficulty sleeping.  You need to use your rescue medicines or inhalers more often than expected.  You have trouble doing routine activities such as getting dressed or walking around the house. Get help right away if:  You have shortness of breath while you are resting.  You have shortness of breath that prevents you from: ? Being able to talk. ? Performing your usual physical activities.  You have chest pain lasting longer than 5 minutes.  Your skin color is more blue (cyanotic) than usual.  You measure low oxygen saturations for longer than 5 minutes with a pulse  oximeter.  You have a fever.  You feel too tired to breathe normally. Summary  Chronic obstructive pulmonary disease (COPD) is a long-term (chronic) condition that affects the lungs.  Your lung function will probably never return to normal. In most cases, it gets worse over time. However, there are steps you can take to slow the progression of the disease and improve your quality of life.  Treatment for COPD may include taking medicines, quitting smoking, pulmonary rehabilitation, and changes to diet and exercise. As the disease progresses, you may need oxygen therapy, a lung transplant, or palliative care.  To help manage your condition, do not smoke, avoid exposure to things that irritate your lungs, stay up to date on all vaccines, and follow your health care provider's instructions for taking medicines. This information is not intended to replace advice given to you by your health care provider. Make sure you discuss any questions you have with your health care provider. Document Revised: 08/06/2017 Document Reviewed: 09/28/2016 Elsevier Patient Education  2020 [employer].

## 2019-10-31 NOTE — Progress Notes (Addendum)
Subjective:    Patient ID: Jake [name], male    DOB: April 18, 1961, 59 y.o.   MRN: [id number]  HPI  Pt is a 59 yo male with HTN and former smoker who presents to the clinic with SOB worsening. He has noticed for the last 3-4 months he has stopped doing things he usually does around the house because feeling tired and SOB. He denies any cough, chills, fever. No wheezing.  No swelling.  3 cig high school and college. 1 pack a day 5 years 2 packs for 8 age.    Review of Systems See HPI.     Objective:   Physical Exam Vitals reviewed.  Constitutional:      Appearance: He is well-developed.  HENT:     Head: Normocephalic.  Cardiovascular:     Rate and Rhythm: Normal rate and regular rhythm.     Heart sounds: No murmur.  Pulmonary:     Effort: Pulmonary effort is normal.     Breath sounds: Normal breath sounds. No wheezing or rhonchi.  Chest:     Chest wall: No mass, tenderness or edema.  Musculoskeletal:     Cervical back: Normal range of motion.  Neurological:     General: No focal deficit present.     Mental Status: He is alert.  Psychiatric:        Mood and Affect: Mood normal.           Assessment & Plan:  Jake [name] KitchenMarland KitchenTaytum was seen today for shortness of breath.  Diagnoses and all orders for this visit:  SOB (shortness of breath) on exertion -     DG Chest 2 View -     umeclidinium-vilanterol (ANORO ELLIPTA) 62.5-25 MCG/INH AEPB; Inhale 1 puff into the lungs daily. -     CT Chest Wo Contrast -     ECHOCARDIOGRAM COMPLETE -     COMPLETE METABOLIC PANEL WITH GFR -     CBC with Differential/Platelet  Former smoker -     DG Chest 2 View -     umeclidinium-vilanterol (ANORO ELLIPTA) 62.5-25 MCG/INH AEPB; Inhale 1 puff into the lungs daily. -     CT Chest Wo Contrast  Muscle spasm of back -     ibuprofen (ADVIL) 800 MG tablet; Take 1 tablet (800 mg total) by mouth every 8 (eight) hours as needed.  Acute midline low back pain without sciatica -     ibuprofen  (ADVIL) 800 MG tablet; Take 1 tablet (800 mg total) by mouth every 8 (eight) hours as needed.  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  O2 stats are down. Concern for COPD due to smoking history.  Will start on anoro.  Will get CXR today.  Peak flows started in green but improved by 50 points with albuterol. He does feel better with albuterol.  Needs to come back for  spironmetry. Will get echo.   20 years 2 packs a day.  10 years 1/2 pack a day.  45 year pack year.  Quit 10 years.    CXR showed: 1. Left perihilar and left infrahilar airspace opacities of unknown clinical significance. These may represent atelectasis or infiltrate. A 4-6 week follow-up two-view chest x-ray is recommended to confirm resolution of this finding. 2. Persistent mild elevation of the left hemidiaphragm. 3. Otherwise, stable appearance of the chest.  Due to SOB for 4 months and smoking hx I will order CT of chest.  Added doxycycline for possible infiltrate. When called patient back he was more SOB and coughing.  Come in for Solumedrol 125mg  and depo medrol 40mg .   Addendum:  CT results: 1. Mild coronary artery calcifications are noted suggesting coronary artery disease. 2. Hepatic steatosis. 3. No other significant abnormality seen in the chest. CAD/atherosclerosis- recheck lipid panel. Start livalo due to not tolerating lipitor.  Will make cardiology referral.  Discussed mediterranean  diet.

## 2019-11-01 ENCOUNTER — Other Ambulatory Visit: Payer: Self-pay | Admitting: Physician Assistant

## 2019-11-01 MED ORDER — DOXYCYCLINE HYCLATE 100 MG PO TABS: 100.0000 mg | ORAL_TABLET | Freq: Two times a day (BID) | ORAL | 0 refills | Status: DC

## 2019-11-01 NOTE — Progress Notes (Signed)
Jake [name],   There were some airspace opacities seen in the left lung could be pneumonia or just some alveloar sac collapse. I do want to go ahead and treat you for walking pneumonia with antibiotic. I want you to take 5 good deep full breaths like we talked about every hour. Recheck in 4-6 weeks. If we can get CT that could replace CXR.   You do have mild elevation of left lung as well. Are you having any abdominal pain/pressure/symptoms? At times abdomen can push up on left lung causing it to lift. Last CT was 2018 and no overt abnormalities.

## 2019-11-03 ENCOUNTER — Ambulatory Visit (INDEPENDENT_AMBULATORY_CARE_PROVIDER_SITE_OTHER): Payer: Medicare HMO

## 2019-11-03 ENCOUNTER — Other Ambulatory Visit: Payer: Self-pay

## 2019-11-03 ENCOUNTER — Ambulatory Visit: Payer: Medicare HMO | Admitting: Physician Assistant

## 2019-11-03 ENCOUNTER — Encounter: Payer: Self-pay | Admitting: Physician Assistant

## 2019-11-03 VITALS — BP 137/70 | HR 91

## 2019-11-03 DIAGNOSIS — R0602 Shortness of breath: Secondary | ICD-10-CM | POA: Diagnosis not present

## 2019-11-03 DIAGNOSIS — I700 Atherosclerosis of aorta: Secondary | ICD-10-CM | POA: Insufficient documentation

## 2019-11-03 DIAGNOSIS — Z87891 Personal history of nicotine dependence: Secondary | ICD-10-CM | POA: Diagnosis not present

## 2019-11-03 DIAGNOSIS — R05 Cough: Secondary | ICD-10-CM | POA: Diagnosis not present

## 2019-11-03 DIAGNOSIS — I2510 Atherosclerotic heart disease of native coronary artery without angina pectoris: Secondary | ICD-10-CM | POA: Insufficient documentation

## 2019-11-03 DIAGNOSIS — K760 Fatty (change of) liver, not elsewhere classified: Secondary | ICD-10-CM | POA: Insufficient documentation

## 2019-11-03 MED ORDER — PITAVASTATIN CALCIUM 4 MG PO TABS: 1.0000 | ORAL_TABLET | Freq: Every day | ORAL | 5 refills | Status: DC

## 2019-11-03 MED ORDER — METHYLPREDNISOLONE ACETATE 40 MG/ML IJ SUSP
40.0000 mg | Freq: Once | INTRAMUSCULAR | Status: AC
  Administered 2019-11-03: 40 mg via INTRAMUSCULAR

## 2019-11-03 MED ORDER — METHYLPREDNISOLONE SODIUM SUCC 125 MG IJ SOLR
125.0000 mg | Freq: Once | INTRAMUSCULAR | Status: AC
  Administered 2019-11-03: 125 mg via INTRAMUSCULAR

## 2019-11-03 NOTE — Progress Notes (Signed)
I personally spoke with patient:  Lungs look good.  CAD and aortic atherosclerosis seen on imaging. Not on statin. Did not tolerate in the past. Will start livalo.  Referral to cardiologist  Hepatic steatosis-weight loss.

## 2019-11-03 NOTE — Progress Notes (Signed)
Pt presents for solumedrol 125 mg and Depo medrol 40 mg injections for SOB.   He denies any CP/palpitaions. He has been doing the breathing exercises and  using the inhalers and started ABX as directed with on complications.Maryruth Eve, Lahoma Crocker, CMA

## 2019-11-03 NOTE — Addendum Note (Signed)
Addended by: Donella Stade on: 11/03/2019 12:33 PM   Modules accepted: Orders, Level of Service

## 2019-11-06 ENCOUNTER — Other Ambulatory Visit: Payer: Self-pay

## 2019-11-06 ENCOUNTER — Ambulatory Visit: Payer: Medicare HMO | Admitting: Cardiology

## 2019-11-06 ENCOUNTER — Encounter: Payer: Self-pay | Admitting: Cardiology

## 2019-11-06 ENCOUNTER — Encounter: Payer: Self-pay | Admitting: *Deleted

## 2019-11-06 VITALS — BP 122/86 | HR 95 | Temp 98.5°F | Ht 71.0 in | Wt 244.1 lb

## 2019-11-06 DIAGNOSIS — I10 Essential (primary) hypertension: Secondary | ICD-10-CM

## 2019-11-06 DIAGNOSIS — R072 Precordial pain: Secondary | ICD-10-CM

## 2019-11-06 DIAGNOSIS — G4733 Obstructive sleep apnea (adult) (pediatric): Secondary | ICD-10-CM

## 2019-11-06 DIAGNOSIS — Z9989 Dependence on other enabling machines and devices: Secondary | ICD-10-CM

## 2019-11-06 DIAGNOSIS — E669 Obesity, unspecified: Secondary | ICD-10-CM

## 2019-11-06 DIAGNOSIS — E782 Mixed hyperlipidemia: Secondary | ICD-10-CM | POA: Diagnosis not present

## 2019-11-06 DIAGNOSIS — R0602 Shortness of breath: Secondary | ICD-10-CM

## 2019-11-06 MED ORDER — NITROGLYCERIN 0.4 MG SL SUBL: 0.4000 mg | SUBLINGUAL_TABLET | SUBLINGUAL | 3 refills | Status: DC | PRN

## 2019-11-06 NOTE — Patient Instructions (Signed)
Medication Instructions: Your physician has recommended you make the following change in your medication:   Nitroglycerin 0.4 mg sublingual (under your tongue) as needed for chest pain. If experiencing chest pain, stop what you are doing and sit down. Take 1 nitroglycerin and wait 5 minutes. If chest pain continues, take another nitroglycerin and wait 5 minutes. If chest pain does not subside, take 1 more nitroglycerin and dial 911. You make take a total of 3 nitroglycerin in a 15 minute time frame.    *If you need a refill on your cardiac medications before your next appointment, please call your pharmacy*   Lab Work: None If you have labs (blood work) drawn today and your tests are completely normal, you will receive your results only by: [name] Kitchen MyChart Message (if you have MyChart) OR . A paper copy in the mail If you have any lab test that is abnormal or we need to change your treatment, we will call you to review the results.   Testing/Procedures: Your physician has requested that you have an echocardiogram. Echocardiography is a painless test that uses sound waves to create images of your heart. It provides your doctor with information about the size and shape of your heart and how well your heart's chambers and valves are working. This procedure takes approximately one hour. There are no restrictions for this procedure.  Your physician has requested that you have en exercise stress myoview. For further information please visit [email or web address]. Please follow instruction sheet, as given.    Follow-Up: At [facility], you and your health needs are our priority.  As part of our continuing mission to provide you with exceptional heart care, we have created designated Provider Care Teams.  These Care Teams include your primary Cardiologist (physician) and Advanced Practice Providers (APPs -  Physician Assistants and Nurse Practitioners) who all work together to provide you with the care  you need, when you need it.  We recommend signing up for the patient portal called "MyChart".  Sign up information is provided on this After Visit Summary.  MyChart is used to connect with patients for Virtual Visits (Telemedicine).  Patients are able to view lab/test results, encounter notes, upcoming appointments, etc.  Non-urgent messages can be sent to your provider as well.   To learn more about what you can do with MyChart, go to [email or web address].    Your next appointment:   3 month(s)  The format for your next appointment:   In Person  Provider:   Berniece Salines, DO   Other Instructions

## 2019-11-06 NOTE — Progress Notes (Signed)
Cardiology Office Note:    Date:  11/06/2019   ID:  Cheral Marker, DOB 01/14/1961, MRN [id number]  PCP:  Donella Stade, PA-C  Cardiologist:  Berniece Salines, DO  Electrophysiologist:  None   Referring MD: Donella Stade, PA-C   The patient was referred by his PCP for chest pain.  History of Present Illness:    Jake [name] is a 59 y.o. male with a hx of hypertension, hyperlipidemia, history of substance abuse presents today to be evaluated for chest pain and pressure.  The patient tells me that over the last several months he has been experiencing increasing chest pressure on exertion.  He described as a midsternal chest pain without radiation.  He quantified these as 4-10.  He admits to associated shortness of breath at times.  His chest pressure resolves with rest.   He also notes that he has been having separate shortness of breath on exertion which he tells me does not matter what he is doing he gets significantly short of breath.  No other complaints at this time.  Past Medical History:  Diagnosis Date  . Arthritis    lower back  . Bipolar disorder ([place])   . GERD (gastroesophageal reflux disease)   . Hyperlipidemia   . Hypertension   . Substance abuse in remission ([place]) 04/06/2018   AVOID OPIATES!    Past Surgical History:  Procedure Laterality Date  . ANKLE FRACTURE SURGERY Left   . APPENDECTOMY    . CERVICAL FUSION    . CHOLECYSTECTOMY    . COLON SURGERY    . HERNIA REPAIR      Current Medications: Current Meds  Medication Sig  . AMBULATORY NON FORMULARY MEDICATION Continuous positive airway pressure (CPAP) machine set at autotitration of H2O pressure, with all supplemental supplies as needed.  . colestipol (COLESTID) 1 g tablet Take 1 tablet by mouth once daily  . dicyclomine (BENTYL) 20 MG tablet TAKE 1 TABLET BY MOUTH THREE TIMES DAILY  . doxycycline (VIBRA-TABS) 100 MG tablet Take 1 tablet (100 mg total) by mouth 2 (two) times daily.  [name] Kitchen ibuprofen  (ADVIL) 800 MG tablet Take 1 tablet (800 mg total) by mouth every 8 (eight) hours as needed.  . loratadine (CLARITIN) 10 MG tablet Take 10 mg by mouth daily.  . Multiple Vitamin (MULTIVITAMIN) tablet Take 1 tablet by mouth daily.  [name] Kitchen OLANZapine (ZYPREXA) 10 MG tablet Take 1 tablet (10 mg total) by mouth at bedtime.  . ondansetron (ZOFRAN) 8 MG tablet Take 1 tablet (8 mg total) by mouth every 8 (eight) hours as needed for nausea or vomiting.  . pantoprazole (PROTONIX) 40 MG tablet Take 1 tablet by mouth twice daily  . Pitavastatin Calcium 4 MG TABS Take 1 tablet (4 mg total) by mouth daily.  . sertraline (ZOLOFT) 100 MG tablet Take 1.5 tablets (150 mg total) by mouth daily.  . sodium bicarbonate 650 MG tablet Take 1 tablet by mouth 4 times daily  . tamsulosin (FLOMAX) 0.4 MG CAPS capsule Take 1 capsule (0.4 mg total) by mouth daily. Needs Appointment  . umeclidinium-vilanterol (ANORO ELLIPTA) 62.5-25 MCG/INH AEPB Inhale 1 puff into the lungs daily.     Allergies:   Fentanyl, Other, Aspirin, Lipitor [atorvastatin], Prednisone, Contrast media [iodinated diagnostic agents], and Nitroglycerin   Social History   Socioeconomic History  . Marital status: Married    Spouse name: Dawn  . Number of children: 1  . Years of education: 76  . Highest education level: Not  on file  Occupational History  . Occupation: part time    Comment: [employer]  Tobacco Use  . Smoking status: Former Research scientist (life sciences)  . Smokeless tobacco: Never Used  Substance and Sexual Activity  . Alcohol use: No  . Drug use: No    Comment: 04/06/2018 Patient's wife wanted [place] to be very aware that her husband is a recovering narcotic abuser  . Sexual activity: Yes  Other Topics Concern  . Not on file  Social History Narrative   Works part time for [employer] Does not exercise due to back issues in the past and strenous job. Daily caffeine use 2-3 drinks a day.   Social Determinants of Health   Financial Resource Strain:   .  Difficulty of Paying Living Expenses: Not on file  Food Insecurity:   . Worried About Charity fundraiser in the Last Year: Not on file  . Ran Out of Food in the Last Year: Not on file  Transportation Needs:   . Lack of Transportation (Medical): Not on file  . Lack of Transportation (Non-Medical): Not on file  Physical Activity:   . Days of Exercise per Week: Not on file  . Minutes of Exercise per Session: Not on file  Stress:   . Feeling of Stress : Not on file  Social Connections:   . Frequency of Communication with Friends and Family: Not on file  . Frequency of Social Gatherings with Friends and Family: Not on file  . Attends Religious Services: Not on file  . Active Member of Clubs or Organizations: Not on file  . Attends Archivist Meetings: Not on file  . Marital Status: Not on file     Family History: The patient's family history includes Alcohol abuse in his father; Alzheimer's disease in his father; Cancer in his father, maternal grandfather, mother, and paternal grandmother; Depression in his mother; Hyperlipidemia in his father and mother.  ROS:   Review of Systems  Constitution: Negative for decreased appetite, fever and weight gain.  HENT: Negative for congestion, ear discharge, hoarse voice and sore throat.   Eyes: Negative for discharge, redness, vision loss in right eye and visual halos.  Cardiovascular: Reports chest pain, dyspnea on exertion.  Negative for leg swelling, orthopnea and palpitations.  Respiratory: Negative for cough, hemoptysis, shortness of breath and snoring.   Endocrine: Negative for heat intolerance and polyphagia.  Hematologic/Lymphatic: Negative for bleeding problem. Does not bruise/bleed easily.  Skin: Negative for flushing, nail changes, rash and suspicious lesions.  Musculoskeletal: Negative for arthritis, joint pain, muscle cramps, myalgias, neck pain and stiffness.  Gastrointestinal: Negative for abdominal pain, bowel  incontinence, diarrhea and excessive appetite.  Genitourinary: Negative for decreased libido, genital sores and incomplete emptying.  Neurological: Negative for brief paralysis, focal weakness, headaches and loss of balance.  Psychiatric/Behavioral: Negative for altered mental status, depression and suicidal ideas.  Allergic/Immunologic: Negative for HIV exposure and persistent infections.    EKGs/Labs/Other Studies Reviewed:    The following studies were reviewed today:   EKG:  The ekg ordered today demonstrates sinus rhythm, heart rate 95 bpm with LVH.  Recent Labs: 03/17/2019: ALT 55; Brain Natriuretic Peptide <4; BUN 27; Creat 1.20; Hemoglobin 14.4; Platelets 242; Potassium 4.6; Sodium 138; TSH 0.96  Recent Lipid Panel    Component Value Date/Time   CHOL 175 06/09/2017 1042   TRIG 226 (H) 06/09/2017 1042   HDL 38 (L) 06/09/2017 1042   CHOLHDL 4.6 06/09/2017 1042   LDLCALC 103 (  H) 06/09/2017 1042    Physical Exam:    VS:  BP 122/86   Pulse 95   Temp 98.5 F (36.9 C)   Ht 5\' 11"  (1.803 m)   Wt 244 lb 1.9 oz (110.7 kg)   SpO2 97%   BMI 34.05 kg/m     Wt Readings from Last 3 Encounters:  11/06/19 244 lb 1.9 oz (110.7 kg)  10/31/19 249 lb (112.9 kg)  09/26/19 242 lb (109.8 kg)     GEN: Well nourished, well developed in no acute distress HEENT: Normal NECK: No JVD; No carotid bruits LYMPHATICS: No lymphadenopathy CARDIAC: S1S2 noted,RRR, no murmurs, rubs, gallops RESPIRATORY:  Clear to auscultation without rales, wheezing or rhonchi  ABDOMEN: Soft, non-tender, non-distended, +bowel sounds, no guarding. EXTREMITIES: No edema, No cyanosis, no clubbing MUSCULOSKELETAL:  No deformity  SKIN: Warm and dry NEUROLOGIC:  Alert and oriented x 3, non-focal PSYCHIATRIC:  Normal affect, good insight  ASSESSMENT:    1. Shortness of breath   2. Precordial pain   3. Essential hypertension   4. Mixed hyperlipidemia   5. Obesity (BMI 30-39.9)   6. OSA on CPAP    PLAN:      Patient chest pressure symptoms is concerning.  Therefore at this time even his risk factors (, hypertension, hyperlipidemia, former smoker) unlikely proceed with ischemic valuation.  He is agreeable to proceed with a exercise nuclear stress test.  Have educated patient and his wife about this testing.  In the meantime sublingual nitroglycerin prescription was sent, its protocol and 911 protocol explained and the patient vocalized understanding questions were answered to the patient's satisfaction.  He notes that he has had a glycerin in the past he has some vomiting but per his wife he had multiple medications at that time she does not think it was from the nitroglycerin.  We will do a trial of nitroglycerin if needed and have asked the patient to let my office know if he experiences any unwanted/undesirable side effects.  In terms of her shortness of breath this could be multifactorial but for now transthoracic echocardiogram will be ordered to assess RV/LV function, any valvular abnormalities or any structural abnormalities.  Hypertension-blood pressure deceptively in the office today.  Hyperlipidemia-he recently was started on Pitavastatin 4 mg daily-I reviewed his lipid profile done in 2018 where his LDL was 103.  His PCP has placed him follow lipid profile which I am going to review at the time of the patient getting his testing.  OSA continue patient CPAP.  Obesity-the patient understands the need to lose weight with diet and exercise. We have discussed specific strategies for this.  The patient is in agreement with the above plan. The patient left the office in stable condition.  The patient will follow up in 3 months or sooner if needed   Medication Adjustments/Labs and Tests Ordered: Current medicines are reviewed at length with the patient today.  Concerns regarding medicines are outlined above.  Orders Placed This Encounter  Procedures  . MYOCARDIAL PERFUSION IMAGING  . EKG 12-Lead   . ECHOCARDIOGRAM COMPLETE   Meds ordered this encounter  Medications  . nitroGLYCERIN (NITROSTAT) 0.4 MG SL tablet    Sig: Place 1 tablet (0.4 mg total) under the tongue every 5 (five) minutes as needed.    Dispense:  30 tablet    Refill:  3    Patient Instructions  Medication Instructions: Your physician has recommended you make the following change in your medication:   Nitroglycerin  0.4 mg sublingual (under your tongue) as needed for chest pain. If experiencing chest pain, stop what you are doing and sit down. Take 1 nitroglycerin and wait 5 minutes. If chest pain continues, take another nitroglycerin and wait 5 minutes. If chest pain does not subside, take 1 more nitroglycerin and dial 911. You make take a total of 3 nitroglycerin in a 15 minute time frame.    *If you need a refill on your cardiac medications before your next appointment, please call your pharmacy*   Lab Work: None If you have labs (blood work) drawn today and your tests are completely normal, you will receive your results only by: [name] Kitchen MyChart Message (if you have MyChart) OR . A paper copy in the mail If you have any lab test that is abnormal or we need to change your treatment, we will call you to review the results.   Testing/Procedures: Your physician has requested that you have an echocardiogram. Echocardiography is a painless test that uses sound waves to create images of your heart. It provides your doctor with information about the size and shape of your heart and how well your heart's chambers and valves are working. This procedure takes approximately one hour. There are no restrictions for this procedure.  Your physician has requested that you have en exercise stress myoview. For further information please visit [email or web address]. Please follow instruction sheet, as given.    Follow-Up: At [facility], you and your health needs are our priority.  As part of our continuing mission to provide you  with exceptional heart care, we have created designated Provider Care Teams.  These Care Teams include your primary Cardiologist (physician) and Advanced Practice Providers (APPs -  Physician Assistants and Nurse Practitioners) who all work together to provide you with the care you need, when you need it.  We recommend signing up for the patient portal called "MyChart".  Sign up information is provided on this After Visit Summary.  MyChart is used to connect with patients for Virtual Visits (Telemedicine).  Patients are able to view lab/test results, encounter notes, upcoming appointments, etc.  Non-urgent messages can be sent to your provider as well.   To learn more about what you can do with MyChart, go to [email or web address].    Your next appointment:   3 month(s)  The format for your next appointment:   In Person  Provider:   Berniece Salines, DO   Other Instructions      Adopting a Healthy Lifestyle.  Know what a healthy weight is for you (roughly BMI <25) and aim to maintain this   Aim for 7+ servings of fruits and vegetables daily   65-80+ fluid ounces of water or unsweet tea for healthy kidneys   Limit to max 1 drink of alcohol per day; avoid smoking/tobacco   Limit animal fats in diet for cholesterol and heart health - choose grass fed whenever available   Avoid highly processed foods, and foods high in saturated/trans fats   Aim for low stress - take time to unwind and care for your mental health   Aim for 150 min of moderate intensity exercise weekly for heart health, and weights twice weekly for bone health   Aim for 7-9 hours of sleep daily   When it comes to diets, agreement about the perfect plan isnt easy to find, even among the experts. Experts at the [employer] developed an idea known as the Healthy Eating Plate. Just imagine a  plate divided into logical, healthy portions.   The emphasis is on diet quality:   Load up on vegetables  and fruits - one-half of your plate: Aim for color and variety, and remember that potatoes dont count.   Go for whole grains - one-quarter of your plate: Whole wheat, barley, wheat berries, quinoa, oats, brown rice, and foods made with them. If you want pasta, go with whole wheat pasta.   Protein power - one-quarter of your plate: Fish, chicken, beans, and nuts are all healthy, versatile protein sources. Limit red meat.   The diet, however, does go beyond the plate, offering a few other suggestions.   Use healthy plant oils, such as olive, canola, soy, corn, sunflower and peanut. Check the labels, and avoid partially hydrogenated oil, which have unhealthy trans fats.   If youre thirsty, drink water. Coffee and tea are good in moderation, but skip sugary drinks and limit milk and dairy products to one or two daily servings.   The type of carbohydrate in the diet is more important than the amount. Some sources of carbohydrates, such as vegetables, fruits, whole grains, and beans-are healthier than others.   Finally, stay active  Signed, Berniece Salines, DO  11/06/2019 3:27 PM    [facility]

## 2019-11-07 ENCOUNTER — Telehealth: Payer: Self-pay | Admitting: Physician Assistant

## 2019-11-07 ENCOUNTER — Other Ambulatory Visit: Payer: Self-pay | Admitting: Physician Assistant

## 2019-11-07 DIAGNOSIS — I700 Atherosclerosis of aorta: Secondary | ICD-10-CM | POA: Diagnosis not present

## 2019-11-07 DIAGNOSIS — I2510 Atherosclerotic heart disease of native coronary artery without angina pectoris: Secondary | ICD-10-CM | POA: Diagnosis not present

## 2019-11-07 DIAGNOSIS — R0602 Shortness of breath: Secondary | ICD-10-CM | POA: Diagnosis not present

## 2019-11-07 DIAGNOSIS — I2584 Coronary atherosclerosis due to calcified coronary lesion: Secondary | ICD-10-CM | POA: Diagnosis not present

## 2019-11-07 DIAGNOSIS — Z131 Encounter for screening for diabetes mellitus: Secondary | ICD-10-CM | POA: Diagnosis not present

## 2019-11-07 DIAGNOSIS — R7301 Impaired fasting glucose: Secondary | ICD-10-CM | POA: Diagnosis not present

## 2019-11-07 DIAGNOSIS — R7309 Other abnormal glucose: Secondary | ICD-10-CM | POA: Diagnosis not present

## 2019-11-07 MED ORDER — PITAVASTATIN CALCIUM 4 MG PO TABS: 1.0000 | ORAL_TABLET | Freq: Every day | ORAL | 5 refills | Status: DC

## 2019-11-07 NOTE — Progress Notes (Signed)
Per patient pharmacy does not have livalo. Resent.

## 2019-11-07 NOTE — Telephone Encounter (Signed)
Received fax for PA on Livalo sent through cover my meds waiting on determination. - CF 

## 2019-11-07 NOTE — Telephone Encounter (Signed)
Received fax from [place] and they approved Livalo from 09/08/2019 - 09/06/2020. - CF

## 2019-11-08 ENCOUNTER — Other Ambulatory Visit: Payer: Self-pay | Admitting: Physician Assistant

## 2019-11-08 MED ORDER — PITAVASTATIN MAGNESIUM 2 MG PO TABS: 1.0000 | ORAL_TABLET | Freq: Every day | ORAL | 5 refills | Status: DC

## 2019-11-08 MED ORDER — PITAVASTATIN CALCIUM 2 MG PO TABS: 1.0000 | ORAL_TABLET | Freq: Every day | ORAL | 5 refills | Status: DC

## 2019-11-08 NOTE — Progress Notes (Signed)
[facility] called and asked if RX could be changed to Pitavastatin Magnesium from Pitavastatin Calcium due to cost? Please advise. RX pended.

## 2019-11-08 NOTE — Progress Notes (Signed)
Jake [name],   LDL not to goal and with risk factors needs to be on cholesterol medication. You should have livalo to try at pharmacy.   Your fasting sugar is a little elevated. Will add A1C to further evaluate to labs.   Your elevated liver enzyme improved slightly and stable. One of the best things you could do is start walking daily and try to keep to a Mediterranean diet.   CBC looks good. No anemia.

## 2019-11-08 NOTE — Progress Notes (Signed)
livalo too expensive. Sent to [facility].

## 2019-11-08 NOTE — Addendum Note (Signed)
Addended by: Donella Stade on: 11/08/2019 03:38 PM   Modules accepted: Orders

## 2019-11-08 NOTE — Addendum Note (Signed)
Addended byAnnamaria Helling on: 11/08/2019 03:19 PM   Modules accepted: Orders

## 2019-11-08 NOTE — Progress Notes (Signed)
Done

## 2019-11-09 LAB — CBC WITH DIFFERENTIAL/PLATELET
Absolute Monocytes: 518 cells/uL (ref 200–950)
Basophils Absolute: 22 cells/uL (ref 0–200)
Basophils Relative: 0.4 %
Eosinophils Absolute: 108 cells/uL (ref 15–500)
Eosinophils Relative: 2 %
HCT: 42.9 % (ref 38.5–50.0)
Hemoglobin: 14.4 g/dL (ref 13.2–17.1)
Lymphs Abs: 1291 cells/uL (ref 850–3900)
MCH: 27.6 pg (ref 27.0–33.0)
MCHC: 33.6 g/dL (ref 32.0–36.0)
MCV: 82.3 fL (ref 80.0–100.0)
MPV: 9.3 fL (ref 7.5–12.5)
Monocytes Relative: 9.6 %
Neutro Abs: 3461 cells/uL (ref 1500–7800)
Neutrophils Relative %: 64.1 %
Platelets: 240 10*3/uL (ref 140–400)
RBC: 5.21 10*6/uL (ref 4.20–5.80)
RDW: 14.1 % (ref 11.0–15.0)
Total Lymphocyte: 23.9 %
WBC: 5.4 10*3/uL (ref 3.8–10.8)

## 2019-11-09 LAB — HEMOGLOBIN A1C W/OUT EAG: Hgb A1c MFr Bld: 6.2 % of total Hgb — ABNORMAL HIGH (ref <5.7)

## 2019-11-09 LAB — LIPID PANEL W/REFLEX DIRECT LDL
Cholesterol: 237 mg/dL — ABNORMAL HIGH (ref <200)
HDL: 45 mg/dL (ref >=40)
LDL Cholesterol (Calc): 150 mg/dL (calc) — ABNORMAL HIGH
Non-HDL Cholesterol (Calc): 192 mg/dL (calc) — ABNORMAL HIGH (ref <130)
Total CHOL/HDL Ratio: 5.3 (calc) — ABNORMAL HIGH (ref <5.0)
Triglycerides: 256 mg/dL — ABNORMAL HIGH (ref <150)

## 2019-11-09 LAB — COMPLETE METABOLIC PANEL WITH GFR
AG Ratio: 2 (calc) (ref 1.0–2.5)
ALT: 51 U/L — ABNORMAL HIGH (ref 9–46)
AST: 30 U/L (ref 10–35)
Albumin: 4.7 g/dL (ref 3.6–5.1)
Alkaline phosphatase (APISO): 66 U/L (ref 35–144)
BUN/Creatinine Ratio: 24 (calc) — ABNORMAL HIGH (ref 6–22)
BUN: 28 mg/dL — ABNORMAL HIGH (ref 7–25)
CO2: 28 mmol/L (ref 20–32)
Calcium: 9.6 mg/dL (ref 8.6–10.3)
Chloride: 102 mmol/L (ref 98–110)
Creat: 1.15 mg/dL (ref 0.70–1.33)
GFR, Est African American: 81 mL/min/{1.73_m2} (ref >=60)
GFR, Est Non African American: 70 mL/min/{1.73_m2} (ref >=60)
Globulin: 2.3 g/dL (calc) (ref 1.9–3.7)
Glucose, Bld: 108 mg/dL — ABNORMAL HIGH (ref 65–99)
Potassium: 4.4 mmol/L (ref 3.5–5.3)
Sodium: 138 mmol/L (ref 135–146)
Total Bilirubin: 0.7 mg/dL (ref 0.2–1.2)
Total Protein: 7 g/dL (ref 6.1–8.1)

## 2019-11-10 ENCOUNTER — Encounter: Payer: Self-pay | Admitting: Physician Assistant

## 2019-11-10 ENCOUNTER — Other Ambulatory Visit: Payer: Self-pay

## 2019-11-10 ENCOUNTER — Telehealth: Payer: Self-pay | Admitting: *Deleted

## 2019-11-10 ENCOUNTER — Ambulatory Visit (HOSPITAL_BASED_OUTPATIENT_CLINIC_OR_DEPARTMENT_OTHER)
Admission: RE | Admit: 2019-11-10 | Discharge: 2019-11-10 | Disposition: A | Discharge status: Home / Self Care | Payer: Medicare HMO | Source: Ambulatory Visit | Attending: Cardiology | Admitting: Cardiology

## 2019-11-10 DIAGNOSIS — I712 Thoracic aortic aneurysm, without rupture: Secondary | ICD-10-CM | POA: Insufficient documentation

## 2019-11-10 DIAGNOSIS — R0602 Shortness of breath: Secondary | ICD-10-CM | POA: Diagnosis not present

## 2019-11-10 DIAGNOSIS — I7121 Aneurysm of the ascending aorta, without rupture: Secondary | ICD-10-CM | POA: Insufficient documentation

## 2019-11-10 NOTE — Progress Notes (Signed)
Dr. Harriet Masson, cardiologist did order this FYI and will follow up.

## 2019-11-10 NOTE — Progress Notes (Signed)
  Echocardiogram 2D Echocardiogram has been performed.  Jake [name] 11/10/2019, 11:53 AM

## 2019-11-10 NOTE — Telephone Encounter (Signed)
-----   Message from Berniece Salines, DO sent at 11/10/2019  1:15 PM EST ----- Normal echo please notify patient.

## 2019-11-10 NOTE — Telephone Encounter (Signed)
Usually we repeat testing in 1 year for aortic dilatation.

## 2019-11-10 NOTE — Telephone Encounter (Signed)
I spoke with patient and reviewed echo results with him.  He has seen results in my chart. He has questions regarding the aortic dilatation that was noted and if any further testing is needed regarding this.

## 2019-11-10 NOTE — Telephone Encounter (Signed)
I spoke with patient and gave him information from Dr Harriet Masson

## 2019-11-13 ENCOUNTER — Other Ambulatory Visit (HOSPITAL_COMMUNITY)
Admission: RE | Admit: 2019-11-13 | Discharge: 2019-11-13 | Disposition: A | Discharge status: Home / Self Care | Payer: Medicare HMO | Source: Ambulatory Visit | Attending: Cardiology | Admitting: Cardiology

## 2019-11-13 DIAGNOSIS — Z01812 Encounter for preprocedural laboratory examination: Secondary | ICD-10-CM | POA: Diagnosis not present

## 2019-11-13 DIAGNOSIS — Z20822 Contact with and (suspected) exposure to covid-19: Secondary | ICD-10-CM | POA: Diagnosis not present

## 2019-11-14 ENCOUNTER — Telehealth (HOSPITAL_COMMUNITY): Payer: Self-pay

## 2019-11-14 LAB — SARS CORONAVIRUS 2 (TAT 6-24 HRS): SARS Coronavirus 2: NEGATIVE

## 2019-11-14 NOTE — Telephone Encounter (Signed)
Encounter complete. 

## 2019-11-16 ENCOUNTER — Other Ambulatory Visit: Payer: Self-pay

## 2019-11-16 ENCOUNTER — Ambulatory Visit (HOSPITAL_COMMUNITY)
Admission: RE | Admit: 2019-11-16 | Discharge: 2019-11-16 | Disposition: A | Discharge status: Home / Self Care | Payer: Medicare HMO | Source: Ambulatory Visit | Attending: Cardiology | Admitting: Cardiology

## 2019-11-16 DIAGNOSIS — R072 Precordial pain: Secondary | ICD-10-CM | POA: Insufficient documentation

## 2019-11-16 LAB — MYOCARDIAL PERFUSION IMAGING
LV dias vol: 122 mL (ref 62–150)
LV sys vol: 56 mL
Peak HR: 125 {beats}/min
Rest HR: 80 {beats}/min
SDS: 2
SRS: 0
SSS: 2
TID: 1.08

## 2019-11-16 MED ORDER — TECHNETIUM TC 99M TETROFOSMIN IV KIT
30.1000 | PACK | Freq: Once | INTRAVENOUS | Status: AC | PRN
  Administered 2019-11-16: 30.1 via INTRAVENOUS
  Filled 2019-11-16: qty 31

## 2019-11-16 MED ORDER — TECHNETIUM TC 99M TETROFOSMIN IV KIT
9.6000 | PACK | Freq: Once | INTRAVENOUS | Status: AC | PRN
  Administered 2019-11-16: 9.6 via INTRAVENOUS
  Filled 2019-11-16: qty 10

## 2019-11-16 MED ORDER — REGADENOSON 0.4 MG/5ML IV SOLN
0.4000 mg | Freq: Once | INTRAVENOUS | Status: AC
  Administered 2019-11-16: 0.4 mg via INTRAVENOUS

## 2019-11-20 ENCOUNTER — Other Ambulatory Visit: Payer: Self-pay | Admitting: Psychiatry

## 2019-11-20 ENCOUNTER — Other Ambulatory Visit: Payer: Self-pay

## 2019-11-20 DIAGNOSIS — F411 Generalized anxiety disorder: Secondary | ICD-10-CM

## 2019-11-20 DIAGNOSIS — F319 Bipolar disorder, unspecified: Secondary | ICD-10-CM

## 2019-11-20 MED ORDER — SERTRALINE HCL 100 MG PO TABS: 150.0000 mg | ORAL_TABLET | Freq: Every day | ORAL | 0 refills | Status: DC

## 2019-11-20 MED ORDER — OLANZAPINE 10 MG PO TABS: 10.0000 mg | ORAL_TABLET | Freq: Every day | ORAL | 0 refills | Status: DC

## 2019-11-20 NOTE — Telephone Encounter (Signed)
Pt requesting refills on his Trazadone and Sertraline. Fill at the [facility] scheduled for 3/24.

## 2019-11-20 NOTE — Telephone Encounter (Signed)
[facility] called back stating that the Sertraline has increased his cholesterol.  So while he has asked for a refill he wants it noted that it causes this problem, and wonders if it should be changed.  Please advise

## 2019-11-20 NOTE — Telephone Encounter (Signed)
Rtc to patient and discussed his refill requests, he was requesting Olanzapine not trazodone. Also informed him his increase in cholesterol was likely coming from him Olanzapine not the Sertraline. Advised him that it would be discussed at his apt next week with Janett Billow. He verbalized understanding.

## 2019-11-21 ENCOUNTER — Other Ambulatory Visit: Payer: Self-pay | Admitting: Physician Assistant

## 2019-11-27 ENCOUNTER — Other Ambulatory Visit: Payer: Self-pay | Admitting: Physician Assistant

## 2019-11-27 DIAGNOSIS — R110 Nausea: Secondary | ICD-10-CM

## 2019-11-29 ENCOUNTER — Encounter: Payer: Self-pay | Admitting: Psychiatry

## 2019-11-29 ENCOUNTER — Ambulatory Visit (INDEPENDENT_AMBULATORY_CARE_PROVIDER_SITE_OTHER): Payer: Medicare HMO | Admitting: Psychiatry

## 2019-11-29 DIAGNOSIS — F411 Generalized anxiety disorder: Secondary | ICD-10-CM | POA: Diagnosis not present

## 2019-11-29 DIAGNOSIS — F319 Bipolar disorder, unspecified: Secondary | ICD-10-CM | POA: Diagnosis not present

## 2019-11-29 DIAGNOSIS — R69 Illness, unspecified: Secondary | ICD-10-CM | POA: Diagnosis not present

## 2019-11-29 MED ORDER — SERTRALINE HCL 100 MG PO TABS: 200.0000 mg | ORAL_TABLET | Freq: Every day | ORAL | 0 refills | Status: DC

## 2019-11-29 MED ORDER — OLANZAPINE 7.5 MG PO TABS: 7.5000 mg | ORAL_TABLET | Freq: Every day | ORAL | 1 refills | Status: DC

## 2019-11-29 NOTE — Progress Notes (Signed)
Jake [name] [id number] 1960-12-07 59 y.o.  Virtual Visit via Telephone Note  I connected with pt on 11/29/19 at 12:45 PM EDT by telephone and verified that I am speaking with the correct person using two identifiers.   I discussed the limitations, risks, security and privacy concerns of performing an evaluation and management service by telephone and the availability of in person appointments. I also discussed with the patient that there may be a patient responsible charge related to this service. The patient expressed understanding and agreed to proceed.   I discussed the assessment and treatment plan with the patient. The patient was provided an opportunity to ask questions and all were answered. The patient agreed with the plan and demonstrated an understanding of the instructions.   The patient was advised to call back or seek an in-person evaluation if the symptoms worsen or if the condition fails to improve as anticipated.  I provided 30 minutes of non-face-to-face time during this encounter.  The patient was located at home.  The provider was located at [place].   Thayer Headings, PMHNP   Subjective:   Patient ID:  Jake [name] is a 59 y.o. (DOB December 09, 1960) male.  Chief Complaint:  Chief Complaint  Patient presents with  . Medication Problem    Possible metabolic side effects with Zyprexa  . Anxiety  . Depression    HPI Jake [name] presents for follow-up of anxiety and depression.He reports that he has noticed some improvement in mood and anxiety s/s with increase in Sertraline. He worry is about the same. Reports that anxiety "comes and goes." He has had some occ panic s/s without full blown panic attacks. Some continue He reports that worry is about the same. Less depression with some continued depressive s/s. He has been sleeping well, about 8-10 hours most night. He and his wife report that he is sleeping excessively at times. Appetite is ok. Less  mindless eating. He is actively trying to lose weight. They have eliminated junk foods. Some slight improvement in concentration with continued difficulty. Denies SI.   They deny any recent manic s/s to include risky or impulsive behavior.   He reports that his energy and motivation improved slightly but remain low.   Concerned about increase in cholesterol.   Past Psychiatric Medication Trials: Latuda- most effective and had jaw clinching/thrusting.  Olanzapine Risperdal Seroquel  Abilify Lithium Carbamazepine Topamax Effexor Sertraline  Review of Systems:  Review of Systems  Respiratory: Positive for shortness of breath.   Musculoskeletal: Negative for gait problem.  Neurological: Negative for tremors.  Psychiatric/Behavioral:       Please refer to HPI    Medications: I have reviewed the patient's current medications.  Current Outpatient Medications  Medication Sig Dispense Refill  . colestipol (COLESTID) 1 g tablet Take 1 tablet by mouth once daily 90 tablet 4  . dicyclomine (BENTYL) 20 MG tablet TAKE 1 TABLET BY MOUTH THREE TIMES DAILY 90 tablet 3  . ibuprofen (ADVIL) 800 MG tablet Take 1 tablet (800 mg total) by mouth every 8 (eight) hours as needed. 90 tablet 1  . LIVALO 2 MG TABS TAKE 1 TABLET (2 MG TOTAL) BY MOUTH DAILY. 30 tablet 5  . loratadine (CLARITIN) 10 MG tablet Take 10 mg by mouth daily.    . Multiple Vitamin (MULTIVITAMIN) tablet Take 1 tablet by mouth daily.    [name] Kitchen OLANZapine (ZYPREXA) 7.5 MG tablet Take 1 tablet (7.5 mg total) by mouth at bedtime. 30 tablet 1  .  ondansetron (ZOFRAN) 8 MG tablet TAKE 1 TABLET BY MOUTH EVERY 8 HOURS AS NEEDED FOR NAUSEA FOR VOMITING 20 tablet 0  . pantoprazole (PROTONIX) 40 MG tablet Take 1 tablet by mouth twice daily 180 tablet 0  . Pitavastatin Magnesium 2 MG TABS Take 1 tablet by mouth daily. 30 tablet 5  . sertraline (ZOLOFT) 100 MG tablet Take 2 tablets (200 mg total) by mouth daily. 180 tablet 0  . sodium bicarbonate  650 MG tablet Take 1 tablet by mouth 4 times daily 360 tablet 0  . tamsulosin (FLOMAX) 0.4 MG CAPS capsule Take 1 capsule (0.4 mg total) by mouth daily. Needs Appointment 90 capsule 0  . umeclidinium-vilanterol (ANORO ELLIPTA) 62.5-25 MCG/INH AEPB Inhale 1 puff into the lungs daily. (Patient taking differently: Inhale 1 puff into the lungs daily as needed. ) 60 each 1  . AMBULATORY NON FORMULARY MEDICATION Continuous positive airway pressure (CPAP) machine set at autotitration of H2O pressure, with all supplemental supplies as needed. 1 each 0  . doxycycline (VIBRA-TABS) 100 MG tablet Take 1 tablet (100 mg total) by mouth 2 (two) times daily. (Patient not taking: Reported on 11/29/2019) 20 tablet 0  . nitroGLYCERIN (NITROSTAT) 0.4 MG SL tablet Place 1 tablet (0.4 mg total) under the tongue every 5 (five) minutes as needed. 30 tablet 3   No current facility-administered medications for this visit.    Medication Side Effects: None  Allergies:  Allergies  Allergen Reactions  . Fentanyl Other (See Comments)    Avoid opiates  . Other     04/06/18 Patient is a recovering narcotic abuser and, per wife, is NEVER to receive controlled substances (e.g. Narcotics, sedatives, etc.)  . Aspirin Other (See Comments)    Per pt  . Lipitor [Atorvastatin]     GI side effects.   . Prednisone     Can not tolerate oral but can tolerate shots.   . Contrast Media [Iodinated Diagnostic Agents] Palpitations  . Nitroglycerin Nausea And Vomiting    Past Medical History:  Diagnosis Date  . Arthritis    lower back  . Bipolar disorder ([place])   . GERD (gastroesophageal reflux disease)   . Hyperlipidemia   . Hypertension   . Substance abuse in remission ([place]) 04/06/2018   AVOID OPIATES!    Family History  Problem Relation Age of Onset  . Cancer Mother        breast  . Depression Mother   . Hyperlipidemia Mother   . Alcohol abuse Father   . Cancer Father        skin  . Hyperlipidemia Father   .  Alzheimer's disease Father   . Cancer Maternal Grandfather   . Cancer Paternal Grandmother     Social History   Socioeconomic History  . Marital status: Married    Spouse name: Dawn  . Number of children: 1  . Years of education: 4  . Highest education level: Not on file  Occupational History  . Occupation: part time    Comment: [employer]  Tobacco Use  . Smoking status: Former Research scientist (life sciences)  . Smokeless tobacco: Never Used  Substance and Sexual Activity  . Alcohol use: No  . Drug use: No    Comment: 04/06/2018 Patient's wife wanted [place] to be very aware that her husband is a recovering narcotic abuser  . Sexual activity: Yes  Other Topics Concern  . Not on file  Social History Narrative   Works part time for [employer] Does not exercise  due to back issues in the past and strenous job. Daily caffeine use 2-3 drinks a day.   Social Determinants of Health   Financial Resource Strain:   . Difficulty of Paying Living Expenses:   Food Insecurity:   . Worried About Charity fundraiser in the Last Year:   . Arboriculturist in the Last Year:   Transportation Needs:   . Film/video editor (Medical):   [name] Kitchen Lack of Transportation (Non-Medical):   Physical Activity:   . Days of Exercise per Week:   . Minutes of Exercise per Session:   Stress:   . Feeling of Stress :   Social Connections:   . Frequency of Communication with Friends and Family:   . Frequency of Social Gatherings with Friends and Family:   . Attends Religious Services:   . Active Member of Clubs or Organizations:   . Attends Archivist Meetings:   [name] Kitchen Marital Status:   Intimate Partner Violence:   . Fear of Current or Ex-Partner:   . Emotionally Abused:   [name] Kitchen Physically Abused:   . Sexually Abused:     Past Medical History, Surgical history, Social history, and Family history were reviewed and updated as appropriate.   Please see review of systems for further details on the patient's review from today.    Objective:   Physical Exam:  Wt 236 lb (107 kg)   BMI 32.92 kg/m   Physical Exam Neurological:     Mental Status: He is alert and oriented to person, place, and time.     Cranial Nerves: No dysarthria.  Psychiatric:        Attention and Perception: Attention and perception normal.        Mood and Affect: Mood is anxious and depressed.        Speech: Speech normal.        Behavior: Behavior is cooperative.        Thought Content: Thought content normal. Thought content is not paranoid or delusional. Thought content does not include homicidal or suicidal ideation. Thought content does not include homicidal or suicidal plan.        Cognition and Memory: Cognition and memory normal.        Judgment: Judgment normal.     Comments: Insight intact     Lab Review:     Component Value Date/Time   NA 138 11/07/2019 1049   K 4.4 11/07/2019 1049   CL 102 11/07/2019 1049   CO2 28 11/07/2019 1049   GLUCOSE 108 (H) 11/07/2019 1049   BUN 28 (H) 11/07/2019 1049   CREATININE 1.15 11/07/2019 1049   CALCIUM 9.6 11/07/2019 1049   PROT 7.0 11/07/2019 1049   ALBUMIN 4.4 11/23/2016 1537   AST 30 11/07/2019 1049   ALT 51 (H) 11/07/2019 1049   ALKPHOS 59 11/23/2016 1537   BILITOT 0.7 11/07/2019 1049   GFRNONAA 70 11/07/2019 1049   GFRAA 81 11/07/2019 1049       Component Value Date/Time   WBC 5.4 11/07/2019 1049   RBC 5.21 11/07/2019 1049   HGB 14.4 11/07/2019 1049   HCT 42.9 11/07/2019 1049   PLT 240 11/07/2019 1049   MCV 82.3 11/07/2019 1049   MCH 27.6 11/07/2019 1049   MCHC 33.6 11/07/2019 1049   RDW 14.1 11/07/2019 1049   LYMPHSABS 1,291 11/07/2019 1049   EOSABS 108 11/07/2019 1049   BASOSABS 22 11/07/2019 1049    No results found for: POCLITH, LITHIUM  No results found for: PHENYTOIN, PHENOBARB, VALPROATE, CBMZ   .res Assessment: Plan:   Patient seen for 30 minutes and discussed decreasing dose of olanzapine to minimize risk of metabolic side effects.  Patient agrees  with plan to decrease olanzapine and will decrease to 7.5 mg at bedtime. Discussed that there has been a partial response to his mood and anxiety signs and symptoms with increase in sertraline from 100 to 150 mg daily, however he continues to have some uncontrolled mood and anxiety signs and symptoms.  Therefore recommend increasing sertraline to 200 mg daily for further improvement in the signs and symptoms.  Discussed that if anxiety signs and symptoms are well controlled with sertraline, he may be more likely to tolerate decreases in Olanzapine.  Patient agrees to an increase in sertraline to 200 mg daily for mood and anxiety signs and symptoms. Patient to follow-up in 4 weeks or sooner if clinically indicated. Patient advised to contact office with any questions, adverse effects, or acute worsening in signs and symptoms.  Jake [name] was seen today for medication problem, anxiety and depression.  Diagnoses and all orders for this visit:  Generalized anxiety disorder -     sertraline (ZOLOFT) 100 MG tablet; Take 2 tablets (200 mg total) by mouth daily.  Bipolar affective disorder, remission status unspecified (HCC) -     OLANZapine (ZYPREXA) 7.5 MG tablet; Take 1 tablet (7.5 mg total) by mouth at bedtime.    Please see After Visit Summary for patient specific instructions.  No future appointments.  No orders of the defined types were placed in this encounter.     -------------------------------

## 2019-12-05 ENCOUNTER — Other Ambulatory Visit: Payer: Self-pay | Admitting: Physician Assistant

## 2019-12-11 ENCOUNTER — Other Ambulatory Visit: Payer: Self-pay | Admitting: Physician Assistant

## 2019-12-17 ENCOUNTER — Other Ambulatory Visit: Payer: Self-pay | Admitting: Physician Assistant

## 2019-12-17 DIAGNOSIS — R110 Nausea: Secondary | ICD-10-CM

## 2019-12-27 DIAGNOSIS — G4733 Obstructive sleep apnea (adult) (pediatric): Secondary | ICD-10-CM | POA: Diagnosis not present

## 2020-01-05 ENCOUNTER — Other Ambulatory Visit: Payer: Self-pay | Admitting: Physician Assistant

## 2020-01-05 DIAGNOSIS — R110 Nausea: Secondary | ICD-10-CM

## 2020-01-08 ENCOUNTER — Other Ambulatory Visit: Payer: Self-pay | Admitting: Physician Assistant

## 2020-01-08 DIAGNOSIS — R110 Nausea: Secondary | ICD-10-CM

## 2020-01-09 ENCOUNTER — Telehealth: Payer: Self-pay | Admitting: Physician Assistant

## 2020-01-09 DIAGNOSIS — R110 Nausea: Secondary | ICD-10-CM

## 2020-01-09 MED ORDER — ONDANSETRON HCL 8 MG PO TABS: 8.0000 mg | ORAL_TABLET | Freq: Three times a day (TID) | ORAL | 3 refills | Status: DC | PRN

## 2020-01-09 NOTE — Telephone Encounter (Signed)
Thank you for following up on that. Prescription sent

## 2020-01-09 NOTE — Telephone Encounter (Signed)
I sent a 30 day supply in with refills. If he has not already been seen for the chronic nausea, he needs to have a work-up for this. I will be happy to place a GI referral for him to have this done. We won't be able to continue the daily administration until we know what is actually going on to cause the nausea.

## 2020-01-09 NOTE — Telephone Encounter (Signed)
Wilferd states he has chronic nausea for years and takes at least 1 tablet daily. He is requesting a 30 day supply. Please advise.

## 2020-01-09 NOTE — Telephone Encounter (Signed)
Just filled #[id number] 12/18/2019. He requested it 2 weeks after it was sent. Sent denial stating he needed to contact our office as to why this was needed early.   Before that - 11/27/2019 #20 no RF, 09/26/2019 #20 with 2 refills.

## 2020-01-09 NOTE — Telephone Encounter (Signed)
Jake [name] states he has been worked up for nausea multiple times without a reason to what is causing the nausea. He reports he has seen multiple GI offices here and in [place].

## 2020-01-09 NOTE — Telephone Encounter (Signed)
Patient wanted to get a refill on his ondansetron (ZOFRAN) 8 MG tablet [id number]. States that it keeps getting denied. Please advise.

## 2020-01-23 ENCOUNTER — Other Ambulatory Visit: Payer: Self-pay | Admitting: Physician Assistant

## 2020-01-23 ENCOUNTER — Other Ambulatory Visit: Payer: Self-pay | Admitting: Psychiatry

## 2020-01-23 DIAGNOSIS — F319 Bipolar disorder, unspecified: Secondary | ICD-10-CM

## 2020-01-29 ENCOUNTER — Telehealth (INDEPENDENT_AMBULATORY_CARE_PROVIDER_SITE_OTHER): Payer: Medicare HMO | Admitting: Psychiatry

## 2020-01-29 ENCOUNTER — Encounter: Payer: Self-pay | Admitting: Psychiatry

## 2020-01-29 ENCOUNTER — Telehealth: Payer: Self-pay | Admitting: Psychiatry

## 2020-01-29 VITALS — Wt 230.0 lb

## 2020-01-29 DIAGNOSIS — F411 Generalized anxiety disorder: Secondary | ICD-10-CM

## 2020-01-29 DIAGNOSIS — R69 Illness, unspecified: Secondary | ICD-10-CM | POA: Diagnosis not present

## 2020-01-29 DIAGNOSIS — F39 Unspecified mood [affective] disorder: Secondary | ICD-10-CM

## 2020-01-29 MED ORDER — SERTRALINE HCL 100 MG PO TABS: 200.0000 mg | ORAL_TABLET | Freq: Every day | ORAL | 0 refills | Status: DC

## 2020-01-29 MED ORDER — BUPROPION HCL ER (XL) 150 MG PO TB24: 150.0000 mg | ORAL_TABLET | Freq: Every day | ORAL | 2 refills | Status: DC

## 2020-01-29 NOTE — Progress Notes (Signed)
Rafferty Elsberry [id number] [birth year] 59 y.o.  Virtual Visit via Video Note  I connected with pt @ on 01/29/20 at  8:30 AM EDT by a video enabled telemedicine application and verified that I am speaking with the correct person using two identifiers.   I discussed the limitations of evaluation and management by telemedicine and the availability of in person appointments. The patient expressed understanding and agreed to proceed.  I discussed the assessment and treatment plan with the patient. The patient was provided an opportunity to ask questions and all were answered. The patient agreed with the plan and demonstrated an understanding of the instructions.   The patient was advised to call back or seek an in-person evaluation if the symptoms worsen or if the condition fails to improve as anticipated.  I provided 30 minutes of non-face-to-face time during this encounter.  The patient was located at home.  The provider was located at [place].   Thayer Headings, PMHNP   Subjective:   Patient ID:  Jake [name] is a 59 y.o. (DOB 01-06-1961) male.  Chief Complaint:  Chief Complaint  Patient presents with  . Depression  . Anxiety    HPI Jake [name] presents for follow-up of depression and anxiety. He reports that he recently had to place his mother in a memory care facility. He reports that this was difficult "but I got through it." He reports that he would like to resume Sertraline to 150 mg po qd due to sexual side effects and limited benefit with increase to 200 mg po qd. He reports that he continues to have panic attacks but they are less frequent and less intense. He reports that he has some form of panic a couple of times a day. Worry has been about the same and has been manageable. He reports that he has some "confidence problems" that are interfering with him being able to get a job. He reports that he has anxiety with job interviews. He reports that crowds and social  situations typically do not bother him. His wife reports that he prefers to watch TV and "zone out." She reports that he will not do things unless he has to. She reports that he will not follow through on it. He reports that his motivation is low. Energy is low. He reports that at times he may be fearful of the outcome of things, such as pursuing what cause of SOB could be. He reports other scenarios where he is avoidant and does not make some calls or open mail as a result. He reports some  Intrusive memories. Denies nightmares. Had some re-experiencing when he went to [place] to place mother. He has been picking and chewing his nail. Wife has been with him consistently for years and feels less anxious when wife is not around. He reports that his mood has "been ok." He reports difficulty with concentration and focus. He reports adequate sleep overall with some awakenings the last few nights after traveling. Appetite has been good. He reports some "mindless eating." Continues to lose weight. Denies SI.   Wife results that pt lost his job in the past when he had addiction to prescribed medications. She reports that he had a very difficult childhood and that in the past he coped with using substances.    Past Psychiatric Medication Trials: Latuda- most effective and had jaw clinching/thrusting.  Olanzapine Risperdal Seroquel  Abilify Lithium Carbamazepine Topamax Effexor Sertraline  Review of Systems:  Review of Systems  Respiratory: Positive for  shortness of breath.        Dyspnea on exertion.  Gastrointestinal:       Chronic GI s/s  Musculoskeletal: Negative for gait problem.  Neurological: Negative for tremors.       Denies involuntary movements  Psychiatric/Behavioral:       Please refer to HPI    Medications: I have reviewed the patient's current medications.  Current Outpatient Medications  Medication Sig Dispense Refill  . AMBULATORY NON FORMULARY MEDICATION Continuous  positive airway pressure (CPAP) machine set at autotitration of H2O pressure, with all supplemental supplies as needed. 1 each 0  . buPROPion (WELLBUTRIN XL) 150 MG 24 hr tablet Take 1 tablet (150 mg total) by mouth daily. 30 tablet 2  . colestipol (COLESTID) 1 g tablet Take 1 tablet by mouth once daily 90 tablet 0  . dicyclomine (BENTYL) 20 MG tablet TAKE 1 TABLET BY MOUTH THREE TIMES DAILY 90 tablet 3  . ibuprofen (ADVIL) 800 MG tablet Take 1 tablet (800 mg total) by mouth every 8 (eight) hours as needed. 90 tablet 1  . LIVALO 2 MG TABS TAKE 1 TABLET (2 MG TOTAL) BY MOUTH DAILY. 30 tablet 5  . loratadine (CLARITIN) 10 MG tablet Take 10 mg by mouth daily.    . Multiple Vitamin (MULTIVITAMIN) tablet Take 1 tablet by mouth daily.    . nitroGLYCERIN (NITROSTAT) 0.4 MG SL tablet Place 1 tablet (0.4 mg total) under the tongue every 5 (five) minutes as needed. 30 tablet 3  . OLANZapine (ZYPREXA) 7.5 MG tablet TAKE 1 TABLET BY MOUTH AT BEDTIME 30 tablet 0  . ondansetron (ZOFRAN) 8 MG tablet Take 1 tablet (8 mg total) by mouth every 8 (eight) hours as needed for nausea or vomiting. Take 1 tab (8mg ) every 8 hours as needed for nausea. 30 tablet 3  . pantoprazole (PROTONIX) 40 MG tablet Take 1 tablet by mouth twice daily 180 tablet 0  . Pitavastatin Magnesium 2 MG TABS Take 1 tablet by mouth daily. 30 tablet 5  . sertraline (ZOLOFT) 100 MG tablet Take 2 tablets (200 mg total) by mouth daily. 180 tablet 0  . sodium bicarbonate 650 MG tablet Take 1 tablet by mouth 4 times daily 360 tablet 0  . tamsulosin (FLOMAX) 0.4 MG CAPS capsule TAKE 1 CAPSULE BY MOUTH ONCE DAILY .  NEED  APPOINTMENT. 90 capsule 0  . umeclidinium-vilanterol (ANORO ELLIPTA) 62.5-25 MCG/INH AEPB Inhale 1 puff into the lungs daily. (Patient taking differently: Inhale 1 puff into the lungs daily as needed. ) 60 each 1   No current facility-administered medications for this visit.    Medication Side Effects: Other: Sexual side effects with  Sertraline 200 mg  Allergies:  Allergies  Allergen Reactions  . Fentanyl Other (See Comments)    Avoid opiates  . Other     04/06/18 Patient is a recovering narcotic abuser and, per wife, is NEVER to receive controlled substances (e.g. Narcotics, sedatives, etc.)  . Aspirin Other (See Comments)    Per pt  . Lipitor [Atorvastatin]     GI side effects.   . Prednisone     Can not tolerate oral but can tolerate shots.   . Contrast Media [Iodinated Diagnostic Agents] Palpitations  . Nitroglycerin Nausea And Vomiting    Past Medical History:  Diagnosis Date  . Arthritis    lower back  . Bipolar disorder ([place])   . GERD (gastroesophageal reflux disease)   . Hyperlipidemia   . Hypertension   .  Substance abuse in remission ([place]) 04/06/2018   AVOID OPIATES!    Family History  Problem Relation Age of Onset  . Cancer Mother        breast  . Depression Mother   . Hyperlipidemia Mother   . Alcohol abuse Father   . Cancer Father        skin  . Hyperlipidemia Father   . Alzheimer's disease Father   . Cancer Maternal Grandfather   . Cancer Paternal Grandmother     Social History   Socioeconomic History  . Marital status: Married    Spouse name: Dawn  . Number of children: 1  . Years of education: 12  . Highest education level: Not on file  Occupational History  . Occupation: part time    Comment: [employer]  Tobacco Use  . Smoking status: Former Research scientist (life sciences)  . Smokeless tobacco: Never Used  Substance and Sexual Activity  . Alcohol use: No  . Drug use: No    Comment: 04/06/2018 Patient's wife wanted [place] to be very aware that her husband is a recovering narcotic abuser  . Sexual activity: Yes  Other Topics Concern  . Not on file  Social History Narrative   Works part time for [employer] Does not exercise due to back issues in the past and strenous job. Daily caffeine use 2-3 drinks a day.   Social Determinants of Health   Financial Resource Strain:   . Difficulty of  Paying Living Expenses:   Food Insecurity:   . Worried About Charity fundraiser in the Last Year:   . Arboriculturist in the Last Year:   Transportation Needs:   . Film/video editor (Medical):   [name] Kitchen Lack of Transportation (Non-Medical):   Physical Activity:   . Days of Exercise per Week:   . Minutes of Exercise per Session:   Stress:   . Feeling of Stress :   Social Connections:   . Frequency of Communication with Friends and Family:   . Frequency of Social Gatherings with Friends and Family:   . Attends Religious Services:   . Active Member of Clubs or Organizations:   . Attends Archivist Meetings:   [name] Kitchen Marital Status:   Intimate Partner Violence:   . Fear of Current or Ex-Partner:   . Emotionally Abused:   [name] Kitchen Physically Abused:   . Sexually Abused:     Past Medical History, Surgical history, Social history, and Family history were reviewed and updated as appropriate.   Please see review of systems for further details on the patient's review from today.   Objective:   Physical Exam:  Wt 230 lb (104.3 kg)   BMI 32.08 kg/m   Physical Exam Neurological:     Mental Status: He is alert and oriented to person, place, and time.     Cranial Nerves: No dysarthria.  Psychiatric:        Attention and Perception: Attention and perception normal.        Mood and Affect: Mood is anxious and depressed. Affect is blunt.        Speech: Speech normal.        Behavior: Behavior is cooperative.        Thought Content: Thought content normal. Thought content is not paranoid or delusional. Thought content does not include homicidal or suicidal ideation. Thought content does not include homicidal or suicidal plan.        Cognition and Memory: Cognition and memory  normal.        Judgment: Judgment normal.     Comments: Insight intact     Lab Review:     Component Value Date/Time   NA 138 11/07/2019 1049   K 4.4 11/07/2019 1049   CL 102 11/07/2019 1049   CO2 28  11/07/2019 1049   GLUCOSE 108 (H) 11/07/2019 1049   BUN 28 (H) 11/07/2019 1049   CREATININE 1.15 11/07/2019 1049   CALCIUM 9.6 11/07/2019 1049   PROT 7.0 11/07/2019 1049   ALBUMIN 4.4 11/23/2016 1537   AST 30 11/07/2019 1049   ALT 51 (H) 11/07/2019 1049   ALKPHOS 59 11/23/2016 1537   BILITOT 0.7 11/07/2019 1049   GFRNONAA 70 11/07/2019 1049   GFRAA 81 11/07/2019 1049       Component Value Date/Time   WBC 5.4 11/07/2019 1049   RBC 5.21 11/07/2019 1049   HGB 14.4 11/07/2019 1049   HCT 42.9 11/07/2019 1049   PLT 240 11/07/2019 1049   MCV 82.3 11/07/2019 1049   MCH 27.6 11/07/2019 1049   MCHC 33.6 11/07/2019 1049   RDW 14.1 11/07/2019 1049   LYMPHSABS 1,291 11/07/2019 1049   EOSABS 108 11/07/2019 1049   BASOSABS 22 11/07/2019 1049    No results found for: POCLITH, LITHIUM   No results found for: PHENYTOIN, PHENOBARB, VALPROATE, CBMZ   .res Assessment: Plan:   Will decrease Sertraline to 150 mg daily since patient reports having sexual side effects at the 200 mg dose with little to no additional improvement in mood or anxiety at the higher dose. Discussed potential benefits, risks, and side effects of Wellbutrin XL to improve mood, energy, motivation, and concentration.  Patient agrees to trial of Wellbutrin XL. Will start Wellbutrin XL 150 mg daily. Will continue olanzapine 7.5 mg at bedtime for mood, anxiety, and insomnia.  Discussed that olanzapine may be able to be decreased in the future, however would not recommend dose reduction at this time due to recent worsening in intrusive memories and anxiety with traveling to [place] and having to place mother in long-term care facility. Discussed potential benefits of resuming therapy.  Patient reports that he has not found therapy to be helpful in the past and is not interested in restarting therapy at this time.  Patient advised to contact provider if he decides therapy would be beneficial in the future. Patient to follow-up  in 4 weeks or sooner if clinically indicated. Patient advised to contact office with any questions, adverse effects, or acute worsening in signs and symptoms.  Jake [name] was seen today for depression and anxiety.  Diagnoses and all orders for this visit:  Episodic mood disorder (HCC) -     buPROPion (WELLBUTRIN XL) 150 MG 24 hr tablet; Take 1 tablet (150 mg total) by mouth daily.  Generalized anxiety disorder -     sertraline (ZOLOFT) 100 MG tablet; Take 2 tablets (200 mg total) by mouth daily.     Please see After Visit Summary for patient specific instructions.  No future appointments.  No orders of the defined types were placed in this encounter.     -------------------------------

## 2020-01-29 NOTE — Telephone Encounter (Signed)
Mr. Jake [name], falcon are scheduled for a virtual visit with your provider today.    Just as we do with appointments in the office, we must obtain your consent to participate.  Your consent will be active for this visit and any virtual visit you may have with one of our providers in the next 365 days.    If you have a MyChart account, I can also send a copy of this consent to you electronically.  All virtual visits are billed to your insurance company just like a traditional visit in the office.  As this is a virtual visit, video technology does not allow for your provider to perform a traditional examination.  This may limit your provider's ability to fully assess your condition.  If your provider identifies any concerns that need to be evaluated in person or the need to arrange testing such as labs, EKG, etc, we will make arrangements to do so.    Although advances in technology are sophisticated, we cannot ensure that it will always work on either your end or our end.  If the connection with a video visit is poor, we may have to switch to a telephone visit.  With either a video or telephone visit, we are not always able to ensure that we have a secure connection.   I need to obtain your verbal consent now.   Are you willing to proceed with your visit today?   Jake [name] has provided verbal consent on 01/29/2020 for a virtual visit (video or telephone).   Thayer Headings, PMHNP 01/29/2020  8:29 AM

## 2020-01-31 ENCOUNTER — Other Ambulatory Visit: Payer: Self-pay | Admitting: Physician Assistant

## 2020-01-31 DIAGNOSIS — Z87891 Personal history of nicotine dependence: Secondary | ICD-10-CM

## 2020-01-31 DIAGNOSIS — R0602 Shortness of breath: Secondary | ICD-10-CM

## 2020-02-21 ENCOUNTER — Encounter: Payer: Self-pay | Admitting: Physician Assistant

## 2020-02-21 ENCOUNTER — Other Ambulatory Visit: Payer: Self-pay

## 2020-02-21 ENCOUNTER — Telehealth: Payer: Self-pay | Admitting: Neurology

## 2020-02-21 ENCOUNTER — Ambulatory Visit (INDEPENDENT_AMBULATORY_CARE_PROVIDER_SITE_OTHER): Payer: Medicare HMO | Admitting: Physician Assistant

## 2020-02-21 VITALS — BP 154/77 | HR 89 | Ht 71.0 in | Wt 238.0 lb

## 2020-02-21 DIAGNOSIS — I10 Essential (primary) hypertension: Secondary | ICD-10-CM | POA: Diagnosis not present

## 2020-02-21 DIAGNOSIS — G4733 Obstructive sleep apnea (adult) (pediatric): Secondary | ICD-10-CM | POA: Diagnosis not present

## 2020-02-21 DIAGNOSIS — R0602 Shortness of breath: Secondary | ICD-10-CM | POA: Diagnosis not present

## 2020-02-21 DIAGNOSIS — J984 Other disorders of lung: Secondary | ICD-10-CM | POA: Insufficient documentation

## 2020-02-21 DIAGNOSIS — Z9989 Dependence on other enabling machines and devices: Secondary | ICD-10-CM

## 2020-02-21 DIAGNOSIS — R0902 Hypoxemia: Secondary | ICD-10-CM | POA: Diagnosis not present

## 2020-02-21 MED ORDER — LOSARTAN POTASSIUM 50 MG PO TABS: 50.0000 mg | ORAL_TABLET | Freq: Every day | ORAL | 1 refills | Status: DC

## 2020-02-21 MED ORDER — ALBUTEROL SULFATE HFA 108 (90 BASE) MCG/ACT IN AERS
2.0000 | INHALATION_SPRAY | Freq: Once | RESPIRATORY_TRACT | Status: AC
  Administered 2020-02-21: 2 via RESPIRATORY_TRACT

## 2020-02-21 MED ORDER — METHYLPREDNISOLONE SODIUM SUCC 125 MG IJ SOLR
125.0000 mg | Freq: Once | INTRAMUSCULAR | Status: AC
Start: 2020-02-21 — End: 2020-02-21
  Administered 2020-02-21: 125 mg via INTRAMUSCULAR

## 2020-02-21 MED ORDER — AMBULATORY NON FORMULARY MEDICATION: 0 refills | Status: DC

## 2020-02-21 NOTE — Progress Notes (Signed)
Subjective:    Patient ID: Jake [name], male    DOB: 08/19/1961, 59 y.o.   MRN: [id number]  HPI  Pt is a 59 yo male with hx of HTN, OSA who presents to the clinic with increasing shortness of breath that is very concerning.  He reports he started feeling some of the symptoms about 2 years ago but really over the last 3 months has it significantly worsened.  He has had to quit his job at [employer] due to his shortness of breath.  He is having trouble doing minimal tasks around the house.Anoro has not helped at all.  Albuterol does help some as needed.  He has seen significant benefit to corticosteroids in the past.  He cannot tolerate oral corticosteroids.  Patient denies any cough, fever, chills, body aches, nausea.  We did do a thorough cardiac work-up with echo, CT, stress test.  He did have an aneurysm of the aortic found on CT.  No significant pulmonary changes.  He is not currently on blood pressure medicine however he is feeling like his blood pressure is rising. 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  In the last week there was a recall on his CPAP machine from [employer].  He wonders how he can go about getting another machine.  He has not used his machine in 2 nights.  He can definitely feel the difference not using his machine.  His overall breathing has him very anxious.  .. Active Ambulatory Problems    Diagnosis Date Noted  . Low back pain 05/05/2016  . Muscle spasm of back 05/05/2016  . Hyperlipidemia 05/19/2016  . Hypertriglyceridemia 05/19/2016  . Diverticulosis of colon without hemorrhage 05/19/2016  . IBS (irritable bowel syndrome) 05/19/2016  . Essential hypertension, benign 05/19/2016  . GERD (gastroesophageal reflux disease) 05/19/2016  . Allergic rhinitis 05/19/2016  . OSA on CPAP 05/19/2016  . Generalized anxiety disorder 05/19/2016  . Mood changes 05/19/2016  . Atelectasis of both lungs 11/20/2016  .  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 History of pancreatitis 05/27/2017  . Diverticulitis 05/27/2017  . Elevated fasting glucose 06/10/2017  . Low testosterone 06/10/2017  . Benign prostatic hyperplasia (BPH) with urinary urgency 06/12/2017  . No energy 06/12/2017  . Former smoker 07/25/2017  . Cough 07/25/2017  . 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 Non-restorative sleep 07/25/2017  . Anxiety 10/30/2015  . Chronic nausea 10/30/2015  . Major depressive disorder 10/30/2015  . Migraine 10/30/2015  . Neck pain 10/30/2015  . Occipital neuralgia 10/30/2015  . Substance abuse in remission ([place]) 04/07/2018  . Bipolar disorder ([place]) 07/04/2018  . Hearing difficulty of right ear 01/16/2019  . IFG (impaired fasting glucose) 03/20/2019  . Coronary artery disease due to calcified coronary lesion 11/03/2019  . Thoracic aorta atherosclerosis ([place]) 11/03/2019  . Aortic atherosclerosis ([place]) 11/03/2019  . Hepatic steatosis 11/03/2019  . SOB (shortness of breath) on exertion 11/06/2019  . Precordial pain 11/06/2019  . Essential hypertension 11/06/2019  . Obesity (BMI 30-39.9) 11/06/2019  . Aneurysm of ascending aorta (HCC) 11/10/2019  . Hypoxia 02/21/2020  . 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/2020   Resolved Ambulatory Problems    Diagnosis Date Noted  . Community acquired pneumonia of left lower lobe of lung 11/20/2016  . Impacted cerumen of right ear 01/16/2019   Past Medical History:  Diagnosis Date  . Arthritis   . Hypertension      Review of  Systems See HPI.     Objective:   Physical Exam Vitals reviewed.  Constitutional:      Appearance: He is well-developed. He is obese.  HENT:     Head: Normocephalic.  Neck:     Thyroid: No thyromegaly.     Vascular: No JVD.     Trachea: No tracheal deviation.  Cardiovascular:     Rate and Rhythm: Normal rate and regular rhythm.     Heart sounds: No murmur heard.   Pulmonary:     Effort: No respiratory distress.     Breath  sounds: Normal breath sounds. No stridor. No decreased breath sounds, wheezing, rhonchi or rales.     Comments: Labored breathing at rest and worse with exertion.  Chest:     Chest wall: No mass, deformity, tenderness or edema.  Musculoskeletal:     Cervical back: Neck supple.     Right lower leg: No edema.     Left lower leg: No edema.  Lymphadenopathy:     Cervical: No cervical adenopathy.  Neurological:     General: No focal deficit present.     Mental Status: He is alert and oriented to person, place, and time.  Psychiatric:        Mood and Affect: Mood is anxious.           Assessment & Plan:  [name] KitchenMarland KitchenRonelle was seen today for shortness of breath.  Diagnoses and all orders for this visit:  SOB (shortness of breath) on exertion -     PR EVAL OF BRONCHOSPASM -     albuterol (VENTOLIN HFA) 108 (90 Base) MCG/ACT inhaler 2 puff -     methylPREDNISolone sodium succinate (SOLU-MEDROL) 125 mg/2 mL injection 125 mg -     For home use only DME oxygen -     Ambulatory referral to Pulmonology  Essential hypertension -     losartan (COZAAR) 50 MG tablet; Take 1 tablet (50 mg total) by mouth daily.  Restrictive airway disease -     For home use only DME oxygen -     Ambulatory referral to Pulmonology  OSA on CPAP -     AMBULATORY NON FORMULARY MEDICATION; Continuous positive airway pressure (CPAP) machine set at autotitration of H2O pressure, with all supplemental supplies as needed.  Hypoxia -     For home use only DME oxygen -     Ambulatory referral to Pulmonology   FEV1 was 53 percent FVC was 42 percent. No significant change in FEV or FVC. 41 percent change in FEF 25-75.   95 percent at start. After 4-6 minutes of walking dropped to 89 percent pulse ox.  Qualifies for O2.   Spirometry looks like a restrictive pattern.stop anoro for now. Ok to continue albuterol as needed. Pt does qualify for O2. Will order and get set up to have at home.   Will refer to pulmonology  to rule out causes and get official dx.   New order for CPAP printed. Will get to company to get another machine.   BP elevated today. Start cozaar. Recheck in 4 weeks.   Reminded to return cologuard kit.   Spent 40 minutes with patient.

## 2020-02-21 NOTE — Patient Instructions (Addendum)
Start BP medication back.  Will refer to pulmonology and get O2 out to the house.  Stop anoro for now.

## 2020-02-21 NOTE — Telephone Encounter (Signed)
Spoke with Jeneen Rinks at [facility]. He states since [employer]'s recall is a nationwide problem, there is a Museum/gallery conservator on [employer] (the only alternative). He states Phillip's will replace everyone's machine but it will take time and it will come from [facility] directly. The only thing that the DME companies can do right now is, if requested from the physician, they can supply the patients with temporary bacterial filters which supply an additional layer of filter for the machines until they can be replaced. Please advise.

## 2020-02-21 NOTE — Telephone Encounter (Signed)
Ok call patient and let them know information. I think best he stay on CPAP and use bacterial filler right now. If they do not want to do that then he needs to use his oxygen that is being ordered all night.

## 2020-02-22 ENCOUNTER — Other Ambulatory Visit: Payer: Self-pay | Admitting: Psychiatry

## 2020-02-22 DIAGNOSIS — F319 Bipolar disorder, unspecified: Secondary | ICD-10-CM

## 2020-02-22 DIAGNOSIS — R0602 Shortness of breath: Secondary | ICD-10-CM | POA: Diagnosis not present

## 2020-02-22 DIAGNOSIS — R0902 Hypoxemia: Secondary | ICD-10-CM | POA: Diagnosis not present

## 2020-02-22 NOTE — Telephone Encounter (Signed)
Patient made aware. He wants to just wear oxygen at night. Has already heard from the company and they are delivering today.

## 2020-02-28 ENCOUNTER — Telehealth: Payer: Self-pay | Admitting: Physician Assistant

## 2020-02-28 ENCOUNTER — Other Ambulatory Visit: Payer: Self-pay | Admitting: Physician Assistant

## 2020-02-28 NOTE — Telephone Encounter (Signed)
Pt called his CPAP is a part of the [employer] recall and stated that he called his insurance and they will pay for another CPAP machine.   Can we send this to another company or the same one and ask them to run this?

## 2020-02-28 NOTE — Telephone Encounter (Signed)
Spoke with patient, he called back again about getting a replacement and I explained again that companies are not doing this that it has to come from [employer].

## 2020-02-29 NOTE — Telephone Encounter (Signed)
I spoke with patient and let him know that no company is going to replace CPAP machines for the recall because it has to come through [employer]. I spoke with [facility] about this specifically and because of the national shortage now, they are making patients in the recall go through a website [employer]ips has set up to get a replacement. I did let patient know this information yesterday.

## 2020-03-01 NOTE — Telephone Encounter (Signed)
Sent order to [facility] to see if they can run this for him. Jeneen Rinks did come to the office 2 days ago and stated no company was replacing these but will try.

## 2020-03-01 NOTE — Telephone Encounter (Signed)
He is saying he called his insurance company and they said they would? If he said they would maybe get james to try. This is [employer]. I will try to reach out to him.

## 2020-03-04 ENCOUNTER — Telehealth: Payer: Self-pay | Admitting: Neurology

## 2020-03-04 ENCOUNTER — Other Ambulatory Visit: Payer: Self-pay | Admitting: Physician Assistant

## 2020-03-04 NOTE — Telephone Encounter (Signed)
Donella Stade, PA-C sent to Annamaria Helling, CMA Thank you. I sent pt update.       Previous Messages  Documented in patient's chart.  ----- Message -----  From: Annamaria Helling, CMA  Sent: 03/01/2020  2:32 PM EDT  To: Donella Stade, PA-C    ----- Message -----  From: Charmian Muff  Sent: 03/01/2020  2:31 PM EDT  To: Annamaria Helling, CMA   No ma'am, we cannot provide a new machine.   His insurance company can tell him whatever they want, but this is not up to them. We are following the guidelines provided by the FDA and by [employer]. Pt needs to call and register his machine with [employer]. The # is [phone].   Again, this is not up to [place] or his insurance. There is a Producer, television/film/video of CPAP machines, and since there is no guideline in place for machine replacement there is nothing we can do here. Pt is welcome to buy a new machine out of pocket. I did verify this with Jeneen Rinks.   ----- Message -----  From: Annamaria Helling, CMA  Sent: 03/01/2020  9:23 AM EDT  To: Phill Mutter, Charmian Muff   Patient is part of [employer]'s recall. He is insistent that his insurance company told him he could get an alternative CPAP machine (not waiting to hear from [employer]). I know Jeneen Rinks said no one was replacing them, but can we run it so that denial can be from insurance so he stops calling us every day about it? Thanks! Order in [place].

## 2020-03-07 DIAGNOSIS — R062 Wheezing: Secondary | ICD-10-CM | POA: Diagnosis not present

## 2020-03-07 DIAGNOSIS — R0902 Hypoxemia: Secondary | ICD-10-CM | POA: Diagnosis not present

## 2020-03-07 DIAGNOSIS — Z87891 Personal history of nicotine dependence: Secondary | ICD-10-CM | POA: Diagnosis not present

## 2020-03-07 DIAGNOSIS — R0602 Shortness of breath: Secondary | ICD-10-CM | POA: Diagnosis not present

## 2020-03-07 DIAGNOSIS — G4733 Obstructive sleep apnea (adult) (pediatric): Secondary | ICD-10-CM | POA: Diagnosis not present

## 2020-03-08 DIAGNOSIS — I712 Thoracic aortic aneurysm, without rupture: Secondary | ICD-10-CM | POA: Diagnosis not present

## 2020-03-08 DIAGNOSIS — R0602 Shortness of breath: Secondary | ICD-10-CM | POA: Diagnosis not present

## 2020-03-19 ENCOUNTER — Telehealth: Payer: Self-pay | Admitting: Physician Assistant

## 2020-03-19 DIAGNOSIS — E782 Mixed hyperlipidemia: Secondary | ICD-10-CM

## 2020-03-19 DIAGNOSIS — R7303 Prediabetes: Secondary | ICD-10-CM

## 2020-03-19 DIAGNOSIS — I10 Essential (primary) hypertension: Secondary | ICD-10-CM

## 2020-03-19 DIAGNOSIS — R351 Nocturia: Secondary | ICD-10-CM

## 2020-03-19 DIAGNOSIS — Z125 Encounter for screening for malignant neoplasm of prostate: Secondary | ICD-10-CM

## 2020-03-19 DIAGNOSIS — E781 Pure hyperglyceridemia: Secondary | ICD-10-CM

## 2020-03-19 NOTE — Telephone Encounter (Signed)
Labs ordered for follow up.

## 2020-03-19 NOTE — Addendum Note (Signed)
Addended byAnnamaria Helling on: 03/19/2020 02:41 PM   Modules accepted: Orders

## 2020-03-20 ENCOUNTER — Other Ambulatory Visit: Payer: Self-pay

## 2020-03-20 ENCOUNTER — Encounter: Payer: Self-pay | Admitting: Physician Assistant

## 2020-03-20 ENCOUNTER — Ambulatory Visit (INDEPENDENT_AMBULATORY_CARE_PROVIDER_SITE_OTHER): Payer: Medicare HMO | Admitting: Physician Assistant

## 2020-03-20 VITALS — BP 138/82 | HR 92 | Ht 71.0 in | Wt 232.0 lb

## 2020-03-20 DIAGNOSIS — Z6832 Body mass index (BMI) 32.0-32.9, adult: Secondary | ICD-10-CM | POA: Diagnosis not present

## 2020-03-20 DIAGNOSIS — J984 Other disorders of lung: Secondary | ICD-10-CM

## 2020-03-20 DIAGNOSIS — E6609 Other obesity due to excess calories: Secondary | ICD-10-CM | POA: Diagnosis not present

## 2020-03-20 DIAGNOSIS — I10 Essential (primary) hypertension: Secondary | ICD-10-CM | POA: Diagnosis not present

## 2020-03-20 DIAGNOSIS — G4733 Obstructive sleep apnea (adult) (pediatric): Secondary | ICD-10-CM

## 2020-03-20 DIAGNOSIS — Z125 Encounter for screening for malignant neoplasm of prostate: Secondary | ICD-10-CM | POA: Diagnosis not present

## 2020-03-20 DIAGNOSIS — R0602 Shortness of breath: Secondary | ICD-10-CM | POA: Diagnosis not present

## 2020-03-20 DIAGNOSIS — E781 Pure hyperglyceridemia: Secondary | ICD-10-CM | POA: Diagnosis not present

## 2020-03-20 DIAGNOSIS — R351 Nocturia: Secondary | ICD-10-CM | POA: Diagnosis not present

## 2020-03-20 DIAGNOSIS — R7303 Prediabetes: Secondary | ICD-10-CM | POA: Diagnosis not present

## 2020-03-20 DIAGNOSIS — Z9989 Dependence on other enabling machines and devices: Secondary | ICD-10-CM | POA: Diagnosis not present

## 2020-03-20 DIAGNOSIS — E782 Mixed hyperlipidemia: Secondary | ICD-10-CM | POA: Diagnosis not present

## 2020-03-20 LAB — PSA: PSA: 1.4 ng/mL (ref <=4.0)

## 2020-03-20 NOTE — Progress Notes (Signed)
Subjective:    Patient ID: Jake [name], male    DOB: 30-Dec-1960, 59 y.o.   MRN: [id number]  HPI  Patient is a 59 year old obese male with shortness of breath, prediabetes, hyperlipidemia, hypertension who presents to the clinic for follow-up.  Patient has seen pulmonology for shortness of breath.  His pulmonary function test did show some restrictive pattern.  His CTA was normal.  They placed him on Breo and told to follow-up in 4 to 6 weeks.  He has not noticed much improvement on.[name] Kitchen  He did get his CPAP worked out and he is using it nightly.  He continues to be short of breath with modest exertion.  Pulmonology suggested could be related.  He is working on weight loss.  He is down 6 pounds since June.  He has had very minimal benefit.  He did not get a pulse oximetry and is not using his oxygen.  .. Active Ambulatory Problems    Diagnosis Date Noted  . Low back pain 05/05/2016  . Muscle spasm of back 05/05/2016  . Hyperlipidemia 05/19/2016  . Hypertriglyceridemia 05/19/2016  . Diverticulosis of colon without hemorrhage 05/19/2016  . IBS (irritable bowel syndrome) 05/19/2016  . GERD (gastroesophageal reflux disease) 05/19/2016  . Allergic rhinitis 05/19/2016  . OSA on CPAP 05/19/2016  . Generalized anxiety disorder 05/19/2016  . Mood changes 05/19/2016  . Atelectasis of both lungs 11/20/2016  . 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 History of pancreatitis 05/27/2017  . Diverticulitis 05/27/2017  . Elevated fasting glucose 06/10/2017  . Low testosterone 06/10/2017  . Benign prostatic hyperplasia (BPH) with urinary urgency 06/12/2017  . No energy 06/12/2017  . Former smoker 07/25/2017  . Cough 07/25/2017  . 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 Non-restorative sleep 07/25/2017  . Anxiety 10/30/2015  . Chronic nausea 10/30/2015  . Major depressive disorder 10/30/2015  . Migraine 10/30/2015  . Neck pain 10/30/2015  . Occipital neuralgia 10/30/2015  . Substance abuse in  remission ([place]) 04/07/2018  . Bipolar disorder ([place]) 07/04/2018  . Hearing difficulty of right ear 01/16/2019  . IFG (impaired fasting glucose) 03/20/2019  . Coronary artery disease due to calcified coronary lesion 11/03/2019  . Thoracic aorta atherosclerosis ([place]) 11/03/2019  . Aortic atherosclerosis ([place]) 11/03/2019  . Hepatic steatosis 11/03/2019  . SOB (shortness of breath) on exertion 11/06/2019  . Precordial pain 11/06/2019  . Essential hypertension 11/06/2019  . Obesity (BMI 30-39.9) 11/06/2019  . Aneurysm of ascending aorta (HCC) 11/10/2019  . Hypoxia 02/21/2020  . 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/2020   Resolved Ambulatory Problems    Diagnosis Date Noted  . Essential hypertension, benign 05/19/2016  . Community acquired pneumonia of left lower lobe of lung 11/20/2016  . Impacted cerumen of right ear 01/16/2019   Past Medical History:  Diagnosis Date  . Arthritis   . Hypertension      Review of Systems  All other systems reviewed and are negative.      Objective:   Physical Exam Vitals reviewed.  Constitutional:      Appearance: Normal appearance. He is obese.  HENT:     Head: Normocephalic.  Cardiovascular:     Rate and Rhythm: Normal rate and regular rhythm.     Pulses: Normal pulses.  Pulmonary:     Effort: Pulmonary effort is normal.     Breath sounds: Normal breath sounds.  Neurological:     General: No focal deficit present.     Mental Status: He is alert and oriented  to person, place, and time.  Psychiatric:        Mood and Affect: Mood normal.    .. Depression screen [facility] 2/9 08/22/2018 02/16/2018 06/09/2017  Decreased Interest 0 0 1  Down, Depressed, Hopeless 1 0 1  PHQ - 2 Score 1 0 2  Altered sleeping - - 0  Tired, decreased energy - - 3  Change in appetite - - 0  Feeling bad or failure about yourself  - - 1  Trouble concentrating - - 0  Moving slowly or fidgety/restless - - 0  Suicidal thoughts - - 0  PHQ-9 Score - - 6  Difficult  doing work/chores - - Somewhat difficult          Assessment & Plan:  [name] KitchenMarland KitchenJobie was seen today for hypertension.  Diagnoses and all orders for this visit:  Restrictive airway disease  SOB (shortness of breath)  Essential hypertension  OSA on CPAP  Class 1 obesity due to excess calories with serious comorbidity and body mass index (BMI) of 32.0 to 32.9 in adult  Prediabetes   BP at first check not to goal. 2nd check better. Suspect BP elevated due to SOb with exertion.  Pt is being seen by cardiology and pulmonology to clear cause. Suggested by pulmonology could be weight.  He has lost 6lbs.  Discussed exercise and nutrition.  Discussed breathing exercises.  Continue BREO. Get pulse ox so that you can monitor oxygen saturations with exertion.  If they drop below 90% we need to use her oxygen.  Discussed ozempic/trulicity for pre-diabetes and weight. Pt does have one case of pancreatitis. We would have to use caution. Discussed metformin. Will hold and see what labs show.   [name] Kitchen.Discussed low carb diet with 1500 calories and 80g of protein.  Exercising at least 150 minutes a week.  [employer]itness Pal could be a Microbiologist.    Continue CPaP.

## 2020-03-21 LAB — LIPID PANEL W/REFLEX DIRECT LDL
Cholesterol: 161 mg/dL (ref <200)
HDL: 43 mg/dL (ref >=40)
LDL Cholesterol (Calc): 93 mg/dL (calc)
Non-HDL Cholesterol (Calc): 118 mg/dL (calc) (ref <130)
Total CHOL/HDL Ratio: 3.7 (calc) (ref <5.0)
Triglycerides: 146 mg/dL (ref <150)

## 2020-03-21 LAB — COMPLETE METABOLIC PANEL WITH GFR
AG Ratio: 2.1 (calc) (ref 1.0–2.5)
ALT: 35 U/L (ref 9–46)
AST: 27 U/L (ref 10–35)
Albumin: 4.8 g/dL (ref 3.6–5.1)
Alkaline phosphatase (APISO): 68 U/L (ref 35–144)
BUN: 18 mg/dL (ref 7–25)
CO2: 31 mmol/L (ref 20–32)
Calcium: 9.6 mg/dL (ref 8.6–10.3)
Chloride: 103 mmol/L (ref 98–110)
Creat: 1.3 mg/dL (ref 0.70–1.33)
GFR, Est African American: 69 mL/min/{1.73_m2} (ref >=60)
GFR, Est Non African American: 60 mL/min/{1.73_m2} (ref >=60)
Globulin: 2.3 g/dL (calc) (ref 1.9–3.7)
Glucose, Bld: 97 mg/dL (ref 65–99)
Potassium: 4.9 mmol/L (ref 3.5–5.3)
Sodium: 141 mmol/L (ref 135–146)
Total Bilirubin: 0.5 mg/dL (ref 0.2–1.2)
Total Protein: 7.1 g/dL (ref 6.1–8.1)

## 2020-03-21 LAB — HEMOGLOBIN A1C
Hgb A1c MFr Bld: 5.6 % of total Hgb (ref <5.7)
Mean Plasma Glucose: 114 (calc)
eAG (mmol/L): 6.3 (calc)

## 2020-03-21 NOTE — Telephone Encounter (Signed)
[place],   Kidney and liver function look good. Cholesterol MUCH better. A1C MUCH better you are out of prediabetes range.   Keep doing what you are doing it is working! I would hold on any medications right now for weight loss. Lets see how much weight can lose in next 4-6 weeks.

## 2020-03-23 DIAGNOSIS — R0602 Shortness of breath: Secondary | ICD-10-CM | POA: Diagnosis not present

## 2020-03-23 DIAGNOSIS — R0902 Hypoxemia: Secondary | ICD-10-CM | POA: Diagnosis not present

## 2020-03-27 ENCOUNTER — Other Ambulatory Visit: Payer: Self-pay | Admitting: Psychiatry

## 2020-03-27 DIAGNOSIS — F319 Bipolar disorder, unspecified: Secondary | ICD-10-CM

## 2020-04-06 ENCOUNTER — Other Ambulatory Visit: Payer: Self-pay | Admitting: Physician Assistant

## 2020-04-06 DIAGNOSIS — K580 Irritable bowel syndrome with diarrhea: Secondary | ICD-10-CM

## 2020-04-15 ENCOUNTER — Other Ambulatory Visit: Payer: Self-pay | Admitting: Physician Assistant

## 2020-04-23 DIAGNOSIS — R0602 Shortness of breath: Secondary | ICD-10-CM | POA: Diagnosis not present

## 2020-04-23 DIAGNOSIS — R0902 Hypoxemia: Secondary | ICD-10-CM | POA: Diagnosis not present

## 2020-04-24 ENCOUNTER — Other Ambulatory Visit: Payer: Self-pay | Admitting: Physician Assistant

## 2020-04-24 DIAGNOSIS — I10 Essential (primary) hypertension: Secondary | ICD-10-CM

## 2020-04-25 ENCOUNTER — Other Ambulatory Visit: Payer: Self-pay | Admitting: Psychiatry

## 2020-04-25 DIAGNOSIS — F319 Bipolar disorder, unspecified: Secondary | ICD-10-CM

## 2020-04-25 DIAGNOSIS — F39 Unspecified mood [affective] disorder: Secondary | ICD-10-CM

## 2020-04-29 ENCOUNTER — Other Ambulatory Visit: Payer: Self-pay | Admitting: Physician Assistant

## 2020-04-29 ENCOUNTER — Other Ambulatory Visit: Payer: Self-pay | Admitting: Nurse Practitioner

## 2020-04-29 DIAGNOSIS — R110 Nausea: Secondary | ICD-10-CM

## 2020-05-09 ENCOUNTER — Other Ambulatory Visit: Payer: Self-pay | Admitting: Psychiatry

## 2020-05-09 DIAGNOSIS — F411 Generalized anxiety disorder: Secondary | ICD-10-CM

## 2020-05-09 NOTE — Telephone Encounter (Signed)
Please review

## 2020-05-15 ENCOUNTER — Other Ambulatory Visit: Payer: Self-pay | Admitting: Physician Assistant

## 2020-05-15 DIAGNOSIS — K580 Irritable bowel syndrome with diarrhea: Secondary | ICD-10-CM

## 2020-05-15 DIAGNOSIS — M545 Low back pain, unspecified: Secondary | ICD-10-CM

## 2020-05-15 DIAGNOSIS — M62830 Muscle spasm of back: Secondary | ICD-10-CM

## 2020-05-23 ENCOUNTER — Other Ambulatory Visit: Payer: Self-pay | Admitting: Psychiatry

## 2020-05-23 DIAGNOSIS — F39 Unspecified mood [affective] disorder: Secondary | ICD-10-CM

## 2020-05-23 DIAGNOSIS — F319 Bipolar disorder, unspecified: Secondary | ICD-10-CM

## 2020-05-23 NOTE — Telephone Encounter (Signed)
review 

## 2020-05-24 DIAGNOSIS — R0602 Shortness of breath: Secondary | ICD-10-CM | POA: Diagnosis not present

## 2020-05-24 DIAGNOSIS — R0902 Hypoxemia: Secondary | ICD-10-CM | POA: Diagnosis not present

## 2020-06-03 ENCOUNTER — Other Ambulatory Visit: Payer: Self-pay | Admitting: Physician Assistant

## 2020-06-13 DIAGNOSIS — G4733 Obstructive sleep apnea (adult) (pediatric): Secondary | ICD-10-CM | POA: Diagnosis not present

## 2020-06-23 ENCOUNTER — Other Ambulatory Visit: Payer: Self-pay | Admitting: Psychiatry

## 2020-06-23 DIAGNOSIS — F319 Bipolar disorder, unspecified: Secondary | ICD-10-CM

## 2020-06-23 DIAGNOSIS — F39 Unspecified mood [affective] disorder: Secondary | ICD-10-CM

## 2020-06-23 DIAGNOSIS — R0902 Hypoxemia: Secondary | ICD-10-CM | POA: Diagnosis not present

## 2020-06-23 DIAGNOSIS — R0602 Shortness of breath: Secondary | ICD-10-CM | POA: Diagnosis not present

## 2020-07-07 ENCOUNTER — Other Ambulatory Visit: Payer: Self-pay | Admitting: Physician Assistant

## 2020-07-07 DIAGNOSIS — K580 Irritable bowel syndrome with diarrhea: Secondary | ICD-10-CM

## 2020-07-10 ENCOUNTER — Telehealth (INDEPENDENT_AMBULATORY_CARE_PROVIDER_SITE_OTHER): Payer: Medicare HMO | Admitting: Physician Assistant

## 2020-07-10 ENCOUNTER — Encounter: Payer: Self-pay | Admitting: Physician Assistant

## 2020-07-10 VITALS — BP 138/88 | HR 96 | Temp 98.8°F | Ht 71.0 in | Wt 232.0 lb

## 2020-07-10 DIAGNOSIS — J40 Bronchitis, not specified as acute or chronic: Secondary | ICD-10-CM | POA: Diagnosis not present

## 2020-07-10 DIAGNOSIS — R0981 Nasal congestion: Secondary | ICD-10-CM | POA: Diagnosis not present

## 2020-07-10 DIAGNOSIS — J029 Acute pharyngitis, unspecified: Secondary | ICD-10-CM

## 2020-07-10 DIAGNOSIS — J329 Chronic sinusitis, unspecified: Secondary | ICD-10-CM | POA: Diagnosis not present

## 2020-07-10 DIAGNOSIS — R059 Cough, unspecified: Secondary | ICD-10-CM

## 2020-07-10 DIAGNOSIS — J984 Other disorders of lung: Secondary | ICD-10-CM

## 2020-07-10 DIAGNOSIS — Z20822 Contact with and (suspected) exposure to covid-19: Secondary | ICD-10-CM | POA: Diagnosis not present

## 2020-07-10 MED ORDER — AZITHROMYCIN 250 MG PO TABS: ORAL_TABLET | ORAL | 0 refills | Status: DC

## 2020-07-10 NOTE — Progress Notes (Signed)
Patient ID: Jake [name], male   DOB: 11/06/1960, 59 y.o.   MRN: [id number] .[name] KitchenVirtual Visit via Video Note  I connected with Cheral Marker on 07/10/20 at 10:30 AM EDT by a video enabled telemedicine application and verified that I am speaking with the correct person using two identifiers.  Location: Patient: home Provider: clinic   I discussed the limitations of evaluation and management by telemedicine and the availability of in person appointments. The patient expressed understanding and agreed to proceed.  History of Present Illness: Pt is a 59 yo male with restrictive airway disease who presents to the clinic with sinus pressure, ear pain, headache, sinus drainage, fatigue, congestion that started 2 days ago. No one else in home is sick. He has been covid vaccinated without booster. He denies any fever, chills, body aches. His cough is dry. No loss of smell or taste. No GI symptoms. Tylenol cold/sinus severe is helping minimally. Pt has a history of sinus infections. He feels like he has one. He has ongoing SOB and does feel like it is worsening with exertion 88 percent at times. He does use O2 at times.  He is not happy with pulmonologist. He would like to follow up with me and come up with a plan to monitor and treat restrictive lung disease. He did feel better with weight loss.    .. Active Ambulatory Problems    Diagnosis Date Noted  . Low back pain 05/05/2016  . Muscle spasm of back 05/05/2016  . Hyperlipidemia 05/19/2016  . Hypertriglyceridemia 05/19/2016  . Diverticulosis of colon without hemorrhage 05/19/2016  . IBS (irritable bowel syndrome) 05/19/2016  . GERD (gastroesophageal reflux disease) 05/19/2016  . Allergic rhinitis 05/19/2016  . OSA on CPAP 05/19/2016  . Generalized anxiety disorder 05/19/2016  . Mood changes 05/19/2016  . Atelectasis of both lungs 11/20/2016  . 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 History of pancreatitis 05/27/2017  . Diverticulitis  05/27/2017  . Elevated fasting glucose 06/10/2017  . Low testosterone 06/10/2017  . Benign prostatic hyperplasia (BPH) with urinary urgency 06/12/2017  . No energy 06/12/2017  . Former smoker 07/25/2017  . Cough 07/25/2017  . 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 Non-restorative sleep 07/25/2017  . Anxiety 10/30/2015  . Chronic nausea 10/30/2015  . Major depressive disorder 10/30/2015  . Migraine 10/30/2015  . Neck pain 10/30/2015  . Occipital neuralgia 10/30/2015  . Substance abuse in remission ([place]) 04/07/2018  . Bipolar disorder ([place]) 07/04/2018  . Hearing difficulty of right ear 01/16/2019  . IFG (impaired fasting glucose) 03/20/2019  . Coronary artery disease due to calcified coronary lesion 11/03/2019  . Thoracic aorta atherosclerosis ([place]) 11/03/2019  . Aortic atherosclerosis ([place]) 11/03/2019  . Hepatic steatosis 11/03/2019  . SOB (shortness of breath) on exertion 11/06/2019  . Precordial pain 11/06/2019  . Essential hypertension 11/06/2019  . Obesity (BMI 30-39.9) 11/06/2019  . Aneurysm of ascending aorta (HCC) 11/10/2019  . Hypoxia 02/21/2020  . 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/2020  . Sinobronchitis 07/10/2020   Resolved Ambulatory Problems    Diagnosis Date Noted  . Essential hypertension, benign 05/19/2016  . Community acquired pneumonia of left lower lobe of lung 11/20/2016  . Impacted cerumen of right ear 01/16/2019   Past Medical History:  Diagnosis Date  . Arthritis   . Hypertension    Reviewed med, allergy, problem list.     Observations/Objective: No acute distress Dry cough Noticeable breathing and increased with exertion.  No wheezing Pale appearance.   .. Today's Vitals  07/10/20 0948  BP: 138/88  Pulse: 96  Temp: 98.8 F (37.1 C)  TempSrc: Oral  SpO2: 94%  Weight: 232 lb (105.2 kg)  Height: 5\' 11"  (1.803 m)   Body mass index is 32.36 kg/m.    Assessment and Plan: [name] KitchenMarland KitchenRivan was seen today for sinus problem.  Diagnoses and  all orders for this visit:  Encounter for laboratory testing for COVID-19 virus -     Novel Coronavirus, NAA ([facility])  Sore throat -     Novel Coronavirus, NAA ([facility])  Cough -     Novel Coronavirus, NAA ([facility])  Sinobronchitis -     azithromycin (ZITHROMAX Z-PAK) 250 MG tablet; Take 2 tablets (500 mg) on  Day 1,  followed by 1 tablet (250 mg) once daily on Days 2 through 5. -     Novel Coronavirus, NAA ([facility])  Restrictive airway disease   Certainly cannot rule out covid. Come to office to get tested. Quarantine until test results. Concerned about covid and worsening oxygen saturation. Use O2 at needed. Pt does not tolerate prednisone orally. He is not in person to get solumedrol shot. Hx of recurrent sinusitis this time of year. Sent zpak to start. Rest and hydrate. Keep deep breathing. Follow pulse ox closely go to ED with any significant problems breathing. He would qualify for monoclonal antibodies if covid positive.   Follow up in office when feel better to discuss weight. This could be causing a lot of his restriction.    Follow Up Instructions:    I discussed the assessment and treatment plan with the patient. The patient was provided an opportunity to ask questions and all were answered. The patient agreed with the plan and demonstrated an understanding of the instructions.   The patient was advised to call back or seek an in-person evaluation if the symptoms worsen or if the condition fails to improve as anticipated.    [name], PA-C

## 2020-07-10 NOTE — Progress Notes (Signed)
Started two days ago Sinus pressure Drainage Sore throat (better today) Cough (dry)   No loss of taste/smell No body aches, chills, diarrhea, nausea, vomiting  Taking tylenol severe cold and sinus - helping some   Will take temp and give to [facility]

## 2020-07-12 LAB — NOVEL CORONAVIRUS, NAA: SARS-CoV-2, NAA: NOT DETECTED

## 2020-07-12 LAB — SARS-COV-2, NAA 2 DAY TAT

## 2020-07-12 NOTE — Progress Notes (Signed)
Negative covid. Great news.

## 2020-07-14 ENCOUNTER — Other Ambulatory Visit: Payer: Self-pay | Admitting: Physician Assistant

## 2020-07-17 ENCOUNTER — Other Ambulatory Visit: Payer: Self-pay | Admitting: *Deleted

## 2020-07-17 ENCOUNTER — Encounter: Payer: Self-pay | Admitting: Physician Assistant

## 2020-07-17 ENCOUNTER — Ambulatory Visit (INDEPENDENT_AMBULATORY_CARE_PROVIDER_SITE_OTHER): Payer: Medicare HMO | Admitting: Physician Assistant

## 2020-07-17 ENCOUNTER — Telehealth: Payer: Self-pay | Admitting: *Deleted

## 2020-07-17 VITALS — BP 108/66 | HR 96 | Ht 71.0 in | Wt 243.0 lb

## 2020-07-17 DIAGNOSIS — R21 Rash and other nonspecific skin eruption: Secondary | ICD-10-CM

## 2020-07-17 DIAGNOSIS — B029 Zoster without complications: Secondary | ICD-10-CM

## 2020-07-17 DIAGNOSIS — Z0189 Encounter for other specified special examinations: Secondary | ICD-10-CM | POA: Diagnosis not present

## 2020-07-17 MED ORDER — VALACYCLOVIR HCL 1 G PO TABS: 1000.0000 mg | ORAL_TABLET | Freq: Three times a day (TID) | ORAL | 0 refills | Status: DC

## 2020-07-17 MED ORDER — LIDOCAINE 5 % EX OINT: 1.0000 "application " | TOPICAL_OINTMENT | Freq: Four times a day (QID) | CUTANEOUS | 1 refills | Status: DC | PRN

## 2020-07-17 NOTE — Telephone Encounter (Signed)
PA submitted through Cover My Meds for Lidocaine ointment. Awaiting response.

## 2020-07-17 NOTE — Progress Notes (Signed)
Subjective:    Patient ID: Jake [name], male    DOB: [birth year], 59 y.o.   MRN: [id number]  HPI  Pt is a 59 yo male who presents to the clinic with painful rash that started on his left occipital area in hairline and spreading down neck and up towards ear for last 2 days. He has not had shingles vaccine. No fever, chills, nausea, body aches. Pain is 6/10. He has not done anything to make better. He did just recover from URI infection.  .. Active Ambulatory Problems    Diagnosis Date Noted  . Low back pain 05/05/2016  . Muscle spasm of back 05/05/2016  . Hyperlipidemia 05/19/2016  . Hypertriglyceridemia 05/19/2016  . Diverticulosis of colon without hemorrhage 05/19/2016  . IBS (irritable bowel syndrome) 05/19/2016  . GERD (gastroesophageal reflux disease) 05/19/2016  . Allergic rhinitis 05/19/2016  . OSA on CPAP 05/19/2016  . Generalized anxiety disorder 05/19/2016  . Mood changes 05/19/2016  . Atelectasis of both lungs 11/20/2016  . 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 History of pancreatitis 05/27/2017  . Diverticulitis 05/27/2017  . Elevated fasting glucose 06/10/2017  . Low testosterone 06/10/2017  . Benign prostatic hyperplasia (BPH) with urinary urgency 06/12/2017  . No energy 06/12/2017  . Former smoker 07/25/2017  . Cough 07/25/2017  . 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 Non-restorative sleep 07/25/2017  . Anxiety 10/30/2015  . Chronic nausea 10/30/2015  . Major depressive disorder 10/30/2015  . Migraine 10/30/2015  . Neck pain 10/30/2015  . Occipital neuralgia 10/30/2015  . Substance abuse in remission ([place]) 04/07/2018  . Bipolar disorder ([place]) 07/04/2018  . Hearing difficulty of right ear 01/16/2019  . IFG (impaired fasting glucose) 03/20/2019  . Coronary artery disease due to calcified coronary lesion 11/03/2019  . Thoracic aorta atherosclerosis ([place]) 11/03/2019  . Aortic atherosclerosis ([place]) 11/03/2019  . Hepatic steatosis 11/03/2019  . SOB  (shortness of breath) on exertion 11/06/2019  . Precordial pain 11/06/2019  . Essential hypertension 11/06/2019  . Obesity (BMI 30-39.9) 11/06/2019  . Aneurysm of ascending aorta (HCC) 11/10/2019  . Hypoxia 02/21/2020  . 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/2020  . Sinobronchitis 07/10/2020   Resolved Ambulatory Problems    Diagnosis Date Noted  . Essential hypertension, benign 05/19/2016  . Community acquired pneumonia of left lower lobe of lung 11/20/2016  . Impacted cerumen of right ear 01/16/2019   Past Medical History:  Diagnosis Date  . Arthritis   . Hypertension      Review of Systems  All other systems reviewed and are negative.      Objective:   Physical Exam Vitals reviewed.  Constitutional:      Appearance: Normal appearance.  Neck:   Cardiovascular:     Rate and Rhythm: Normal rate.     Pulses: Normal pulses.  Neurological:     General: No focal deficit present.     Mental Status: He is alert and oriented to person, place, and time.  Psychiatric:        Mood and Affect: Mood normal.           Assessment & Plan:  [name] KitchenMarland KitchenAkio was seen today for herpes zoster.  Diagnoses and all orders for this visit:  Herpes zoster without complication -     valACYclovir (VALTREX) 1000 MG tablet; Take 1 tablet (1,000 mg total) by mouth 3 (three) times daily. For 7 days. -     lidocaine (XYLOCAINE) 5 % ointment; Apply 1 application topically 4 (four) times daily  as needed. -     SureSwab HSV, Type 1/2 DNA, PCR  Rash and nonspecific skin eruption -     valACYclovir (VALTREX) 1000 MG tablet; Take 1 tablet (1,000 mg total) by mouth 3 (three) times daily. For 7 days. -     lidocaine (XYLOCAINE) 5 % ointment; Apply 1 application topically 4 (four) times daily as needed. -     SureSwab HSV, Type 1/2 DNA, PCR   Presentation consistent with shingles. Sent valtrex. Discussed ibuprofen 800mg  TID. Viral culture sent for confirmation. Discussed pain. Lidocaine ointment was  sent. Encouraged cool compresses. HO given. Consider gabapentin if pain is progressing. Pt is recovering addict. AVOID all narcotics. Discussed keep any active blisters covered to avoid spreading. Once vesicles of the rash resolved not symptomatic. Keep hands washed.   Spent 30 minutes with patient discussing plan and progression.

## 2020-07-17 NOTE — Patient Instructions (Signed)
Shingles  Shingles is an infection. It gives you a painful skin rash and blisters that have fluid in them. Shingles is caused by the same germ (virus) that causes chickenpox. Shingles only happens in people who:  Have had chickenpox.  Have been given a shot of medicine (vaccine) to protect against chickenpox. Shingles is rare in this group. The first symptoms of shingles may be itching, tingling, or pain in an area on your skin. A rash will show on your skin a few days or weeks later. The rash is likely to be on one side of your body. The rash usually has a shape like a belt or a band. Over time, the rash turns into fluid-filled blisters. The blisters will break open, change into scabs, and dry up. Medicines may:  Help with pain and itching.  Help you get better sooner.  Help to prevent long-term problems. Follow these instructions at home: Medicines  Take over-the-counter and prescription medicines only as told by your doctor.  Put on an anti-itch cream or numbing cream where you have a rash, blisters, or scabs. Do this as told by your doctor. Helping with itching and discomfort   Put cold, wet cloths (cold compresses) on the area of the rash or blisters as told by your doctor.  Cool baths can help you feel better. Try adding baking soda or dry oatmeal to the water to lessen itching. Do not bathe in hot water. Blister and rash care  Keep your rash covered with a loose bandage (dressing).  Wear loose clothing that does not rub on your rash.  Keep your rash and blisters clean. To do this, wash the area with mild soap and cool water as told by your doctor.  Check your rash every day for signs of infection. Check for: ? More redness, swelling, or pain. ? Fluid or blood. ? Warmth. ? Pus or a bad smell.  Do not scratch your rash. Do not pick at your blisters. To help you to not scratch: ? Keep your fingernails clean and cut short. ? Wear gloves or mittens when you sleep, if  scratching is a problem. General instructions  Rest as told by your doctor.  Keep all follow-up visits as told by your doctor. This is important.  Wash your hands often with soap and water. If soap and water are not available, use hand sanitizer. Doing this lowers your chance of getting a skin infection caused by germs (bacteria).  Your infection can cause chickenpox in people who have never had chickenpox or never got a shot of chickenpox vaccine. If you have blisters that did not change into scabs yet, try not to touch other people or be around other people, especially: ? Babies. ? Pregnant women. ? Children who have areas of red, itchy, or rough skin (eczema). ? Very old people who have transplants. ? People who have a long-term (chronic) sickness, like cancer or AIDS. Contact a doctor if:  Your pain does not get better with medicine.  Your pain does not get better after the rash heals.  You have any signs of infection in the rash area. These signs include: ? More redness, swelling, or pain around the rash. ? Fluid or blood coming from the rash. ? The rash area feeling warm to the touch. ? Pus or a bad smell coming from the rash. Get help right away if:  The rash is on your face or nose.  You have pain in your face or pain by   your eye.  You lose feeling on one side of your face.  You have trouble seeing.  You have ear pain, or you have ringing in your ear.  You have a loss of taste.  Your condition gets worse. Summary  Shingles gives you a painful skin rash and blisters that have fluid in them.  Shingles is an infection. It is caused by the same germ (virus) that causes chickenpox.  Keep your rash covered with a loose bandage (dressing). Wear loose clothing that does not rub on your rash.  If you have blisters that did not change into scabs yet, try not to touch other people or be around people. This information is not intended to replace advice given to you by  your health care provider. Make sure you discuss any questions you have with your health care provider. Document Revised: 12/16/2018 Document Reviewed: 04/28/2017 Elsevier Patient Education  2020 [employer]  

## 2020-07-17 NOTE — Addendum Note (Signed)
Addended by: Donella Stade on: 07/17/2020 03:56 PM   Modules accepted: Orders

## 2020-07-17 NOTE — Telephone Encounter (Signed)
Lidocaine ointment approved. Pharmacy notified.

## 2020-07-19 LAB — SURESWAB HSV, TYPE 1/2 DNA, PCR
HSV 1 DNA: NOT DETECTED
HSV 2 DNA: NOT DETECTED

## 2020-07-22 NOTE — Progress Notes (Signed)
Apolonio Schneiders this is the patient I asked you about that you looked at your chart and told me orange top was ok. I just want to make sure in future ordering the right top. Can you help?

## 2020-07-22 NOTE — Progress Notes (Signed)
I thought this tested for zoster as well. That is what I was looking for.

## 2020-07-24 DIAGNOSIS — R0602 Shortness of breath: Secondary | ICD-10-CM | POA: Diagnosis not present

## 2020-07-24 DIAGNOSIS — R0902 Hypoxemia: Secondary | ICD-10-CM | POA: Diagnosis not present

## 2020-07-26 NOTE — Progress Notes (Signed)
Ok but I needed herpes zoster not just herpes simplex. Which one for zoster?

## 2020-07-29 ENCOUNTER — Telehealth (INDEPENDENT_AMBULATORY_CARE_PROVIDER_SITE_OTHER): Payer: Medicare HMO | Admitting: Psychiatry

## 2020-07-29 ENCOUNTER — Other Ambulatory Visit: Payer: Self-pay | Admitting: Physician Assistant

## 2020-07-29 ENCOUNTER — Encounter: Payer: Self-pay | Admitting: Psychiatry

## 2020-07-29 DIAGNOSIS — F411 Generalized anxiety disorder: Secondary | ICD-10-CM | POA: Diagnosis not present

## 2020-07-29 DIAGNOSIS — F319 Bipolar disorder, unspecified: Secondary | ICD-10-CM

## 2020-07-29 DIAGNOSIS — R110 Nausea: Secondary | ICD-10-CM

## 2020-07-29 DIAGNOSIS — F39 Unspecified mood [affective] disorder: Secondary | ICD-10-CM | POA: Diagnosis not present

## 2020-07-29 DIAGNOSIS — I10 Essential (primary) hypertension: Secondary | ICD-10-CM

## 2020-07-29 DIAGNOSIS — R69 Illness, unspecified: Secondary | ICD-10-CM | POA: Diagnosis not present

## 2020-07-29 MED ORDER — BUPROPION HCL ER (XL) 150 MG PO TB24: 150.0000 mg | ORAL_TABLET | Freq: Every day | ORAL | 5 refills | Status: DC

## 2020-07-29 MED ORDER — OLANZAPINE 7.5 MG PO TABS: 7.5000 mg | ORAL_TABLET | Freq: Every day | ORAL | 5 refills | Status: DC

## 2020-07-29 MED ORDER — SERTRALINE HCL 100 MG PO TABS: ORAL_TABLET | ORAL | 1 refills | Status: DC

## 2020-07-29 NOTE — Progress Notes (Signed)
Jake [name] [id number] 03/06/1961 59 y.o.  Virtual Visit via Telephone Note  I connected with pt on 07/29/20 at  9:30 AM EST by telephone and verified that I am speaking with the correct person using two identifiers.   I discussed the limitations, risks, security and privacy concerns of performing an evaluation and management service by telephone and the availability of in person appointments. I also discussed with the patient that there may be a patient responsible charge related to this service. The patient expressed understanding and agreed to proceed.   I discussed the assessment and treatment plan with the patient. The patient was provided an opportunity to ask questions and all were answered. The patient agreed with the plan and demonstrated an understanding of the instructions.   The patient was advised to call back or seek an in-person evaluation if the symptoms worsen or if the condition fails to improve as anticipated.  I provided 25 minutes of non-face-to-face time during this encounter.  The patient was located at home.  The provider was located at [place].   Thayer Headings, PMHNP   Subjective:   Patient ID:  Jake [name] is a 59 y.o. (DOB 04-22-1961) male.  Chief Complaint:  Chief Complaint  Patient presents with  . Depression  . Anxiety    HPI Jake [name] presents for follow-up of depression and anxiety. He reports "the combination of medications I have is better than I have been in a while." He reports that Wellbutrin XL has been helpful for the "get up in go." He reports that decrease in Sertraline was helpful for sexual side effects. He reports that he continues to have persistent depression- "I don't see an end for me." He reports that some days he has difficulty getting up and going. He reports that energy and motivation have improved "but it's no where near what I used to be like." He reports occasional panic attacks. Continued anxiety in  crowds and social situations. He reports that he has had some worry and anxious thoughts and reports that this is unchanged. Denies intrusive memories. He reports that sleep has been "not bad" with occ restless nights. Sleeps from about 11 pm to 5:30-8:30 am. Appetite has been good. Lost 20-25 lbs and gained back 10-15 lbs. He reports that he gained some weight over vacation. Concentration varies. Denies SI.   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 Has 6 month and 38 month old granddaughter. Daughter had COVID and then daughter's mother-in-law died from [place]. He and his wife kept grandchildren during this. His mother is now in a memory care unit in [place].    Past Psychiatric Medication Trials: Latuda- most effective and had jaw clinching/thrusting.  Olanzapine Risperdal Seroquel  Abilify Lithium Carbamazepine Topamax Effexor Sertraline   Review of Systems:  Review of Systems  Respiratory: Positive for shortness of breath.   Musculoskeletal: Negative for gait problem.  Neurological: Negative for tremors.  Psychiatric/Behavioral:       Please refer to HPI    Medications: I have reviewed the patient's current medications.  Current Outpatient Medications  Medication Sig Dispense Refill  . buPROPion (WELLBUTRIN XL) 150 MG 24 hr tablet Take 1 tablet (150 mg total) by mouth daily. 30 tablet 5  . colestipol (COLESTID) 1 g tablet Take 1 tablet by mouth once daily 90 tablet 0  . dicyclomine (BENTYL) 20 MG tablet TAKE 1 TABLET BY MOUTH THREE TIMES DAILY 90 tablet  0  . ibuprofen (ADVIL) 800 MG tablet TAKE 1 TABLET BY MOUTH EVERY 8 HOURS AS NEEDED 90 tablet 0  . loratadine (CLARITIN) 10 MG tablet Take 10 mg by mouth daily.    [name] Kitchen losartan (COZAAR) 50 MG tablet Take 1 tablet by mouth once daily 90 tablet 0  . Multiple Vitamin (MULTIVITAMIN) tablet Take 1 tablet by mouth daily.     [name] Kitchen OLANZapine (ZYPREXA) 7.5 MG tablet Take 1  tablet (7.5 mg total) by mouth at bedtime. 30 tablet 5  . Omega-3 Fatty Acids (FISH OIL) 1000 MG CAPS Take 2,000 mg by mouth daily.    . ondansetron (ZOFRAN) 8 MG tablet TAKE 1 TABLET BY MOUTH EVERY 8 HOURS AS NEEDED FOR NAUSEA FOR VOMITING 30 tablet 2  . pantoprazole (PROTONIX) 40 MG tablet Take 1 tablet by mouth twice daily 180 tablet 3  . Pitavastatin Magnesium 2 MG TABS Take 1 tablet by mouth daily. 30 tablet 5  . sertraline (ZOLOFT) 100 MG tablet TAKE 1 & 1/2 (ONE & ONE-HALF) TABLETS BY MOUTH ONCE DAILY 135 tablet 1  . sodium bicarbonate 650 MG tablet Take 1 tablet by mouth 4 times daily 360 tablet 0  . tamsulosin (FLOMAX) 0.4 MG CAPS capsule Take 1 capsule by mouth once daily 90 capsule 0  . ALBUTEROL SULFATE HFA IN Inhale into the lungs.    . AMBULATORY NON FORMULARY MEDICATION Continuous positive airway pressure (CPAP) machine set at autotitration of H2O pressure, with all supplemental supplies as needed. 1 each 0  . fluticasone furoate-vilanterol (BREO ELLIPTA) 100-25 MCG/INH AEPB Inhale 1 puff into the lungs daily. (Patient not taking: Reported on 07/29/2020)    . lidocaine (XYLOCAINE) 5 % ointment Apply 1 application topically 4 (four) times daily as needed. 35 g 1  . nitroGLYCERIN (NITROSTAT) 0.4 MG SL tablet Place 1 tablet (0.4 mg total) under the tongue every 5 (five) minutes as needed. 30 tablet 3  . valACYclovir (VALTREX) 1000 MG tablet Take 1 tablet (1,000 mg total) by mouth 3 (three) times daily. For 7 days. (Patient not taking: Reported on 07/29/2020) 21 tablet 0   No current facility-administered medications for this visit.    Medication Side Effects: None  Denies any involuntary movements.   Allergies:  Allergies  Allergen Reactions  . Fentanyl Other (See Comments)    Avoid opiates  . Other     04/06/18 Patient is a recovering narcotic abuser and, per wife, is NEVER to receive controlled substances (e.g. Narcotics, sedatives, etc.)  . Aspirin Other (See Comments)     Per pt  . Lipitor [Atorvastatin]     GI side effects.   . Prednisone     Can not tolerate oral but can tolerate shots.   . Contrast Media [Iodinated Diagnostic Agents] Palpitations  . Nitroglycerin Nausea And Vomiting    Past Medical History:  Diagnosis Date  . Arthritis    lower back  . Bipolar disorder ([place])   . GERD (gastroesophageal reflux disease)   . Hyperlipidemia   . Hypertension   . Substance abuse in remission ([place]) 04/06/2018   AVOID OPIATES!    Family History  Problem Relation Age of Onset  . Cancer Mother        breast  . Depression Mother   . Hyperlipidemia Mother   . Alcohol abuse Father   . Cancer Father        skin  . Hyperlipidemia Father   . Alzheimer's disease Father   . Cancer Maternal Grandfather   .  Cancer Paternal Grandmother     Social History   Socioeconomic History  . Marital status: Married    Spouse name: Dawn  . Number of children: 1  . Years of education: 82  . Highest education level: Not on file  Occupational History  . Occupation: part time    Comment: [employer]  Tobacco Use  . Smoking status: Former Research scientist (life sciences)  . Smokeless tobacco: Never Used  Vaping Use  . Vaping Use: Never used  Substance and Sexual Activity  . Alcohol use: No  . Drug use: No    Comment: 04/06/2018 Patient's wife wanted [place] to be very aware that her husband is a recovering narcotic abuser  . Sexual activity: Yes  Other Topics Concern  . Not on file  Social History Narrative   Works part time for [employer] Does not exercise due to back issues in the past and strenous job. Daily caffeine use 2-3 drinks a day.   Social Determinants of Health   Financial Resource Strain:   . Difficulty of Paying Living Expenses: Not on file  Food Insecurity:   . Worried About Charity fundraiser in the Last Year: Not on file  . Ran Out of Food in the Last Year: Not on file  Transportation Needs:   . Lack of Transportation (Medical): Not on file  . Lack of  Transportation (Non-Medical): Not on file  Physical Activity:   . Days of Exercise per Week: Not on file  . Minutes of Exercise per Session: Not on file  Stress:   . Feeling of Stress : Not on file  Social Connections:   . Frequency of Communication with Friends and Family: Not on file  . Frequency of Social Gatherings with Friends and Family: Not on file  . Attends Religious Services: Not on file  . Active Member of Clubs or Organizations: Not on file  . Attends Archivist Meetings: Not on file  . Marital Status: Not on file  Intimate Partner Violence:   . Fear of Current or Ex-Partner: Not on file  . Emotionally Abused: Not on file  . Physically Abused: Not on file  . Sexually Abused: Not on file    Past Medical History, Surgical history, Social history, and Family history were reviewed and updated as appropriate.   Please see review of systems for further details on the patient's review from today.   Objective:   Physical Exam:  There were no vitals taken for this visit.  Physical Exam Neurological:     Mental Status: He is alert and oriented to person, place, and time.     Cranial Nerves: No dysarthria.  Psychiatric:        Attention and Perception: Attention and perception normal.        Speech: Speech normal.        Behavior: Behavior is cooperative.        Thought Content: Thought content normal. Thought content is not paranoid or delusional. Thought content does not include homicidal or suicidal ideation. Thought content does not include homicidal or suicidal plan.        Cognition and Memory: Cognition and memory normal.        Judgment: Judgment normal.     Comments: Insight intact Mood presents as less anxious and less depressed compared to recent exams     Lab Review:     Component Value Date/Time   NA 141 03/20/2020 1044   K 4.9 03/20/2020 1044  CL 103 03/20/2020 1044   CO2 31 03/20/2020 1044   GLUCOSE 97 03/20/2020 1044   BUN 18 03/20/2020  1044   CREATININE 1.30 03/20/2020 1044   CALCIUM 9.6 03/20/2020 1044   PROT 7.1 03/20/2020 1044   ALBUMIN 4.4 11/23/2016 1537   AST 27 03/20/2020 1044   ALT 35 03/20/2020 1044   ALKPHOS 59 11/23/2016 1537   BILITOT 0.5 03/20/2020 1044   GFRNONAA 60 03/20/2020 1044   GFRAA 69 03/20/2020 1044       Component Value Date/Time   WBC 5.4 11/07/2019 1049   RBC 5.21 11/07/2019 1049   HGB 14.4 11/07/2019 1049   HCT 42.9 11/07/2019 1049   PLT 240 11/07/2019 1049   MCV 82.3 11/07/2019 1049   MCH 27.6 11/07/2019 1049   MCHC 33.6 11/07/2019 1049   RDW 14.1 11/07/2019 1049   LYMPHSABS 1,291 11/07/2019 1049   EOSABS 108 11/07/2019 1049   BASOSABS 22 11/07/2019 1049    No results found for: POCLITH, LITHIUM   No results found for: PHENYTOIN, PHENOBARB, VALPROATE, CBMZ   .res Assessment: Plan:   Discussed treatment plan with pt and he reports that he would like to continue current medications without changes since he reports that his mood and anxiety s/s have been better controlled recently than he has experienced in a significant amount of time. Reviewed labs and discussed that his cholesterol and Hgb A1C are now WNL. Discussed option of dose reduction in Olanzapine or continuing current dose. He reports that he would prefer to continue Olanzapine at 7.5 mg po QHS.  Continue Olanzapine 7.5 mg po QHS for mood and anxiety.  Continue Wellbutrin XL 150 mg po qd for depression.  Continue Sertraline 150 mg po qd for depression and anxiety.  Pt to follow-up in 6 months or sooner if clinically indicated.  Patient advised to contact office with any questions, adverse effects, or acute worsening in signs and symptoms.  Jake [name] was seen today for depression and anxiety.  Diagnoses and all orders for this visit:  Episodic mood disorder (HCC) -     buPROPion (WELLBUTRIN XL) 150 MG 24 hr tablet; Take 1 tablet (150 mg total) by mouth daily.  Bipolar affective disorder, remission status unspecified  (HCC) -     OLANZapine (ZYPREXA) 7.5 MG tablet; Take 1 tablet (7.5 mg total) by mouth at bedtime.  Generalized anxiety disorder -     sertraline (ZOLOFT) 100 MG tablet; TAKE 1 & 1/2 (ONE & ONE-HALF) TABLETS BY MOUTH ONCE DAILY    Please see After Visit Summary for patient specific instructions.  No future appointments.  No orders of the defined types were placed in this encounter.     -------------------------------

## 2020-07-30 NOTE — Progress Notes (Signed)
Pt should not be charged for this lab then if did not check for what I wanted.

## 2020-08-05 NOTE — Progress Notes (Signed)
Ok let patient know only checked for HSV and lab did not run herpes zoster so we do not know for sure but see how doing?

## 2020-08-08 ENCOUNTER — Other Ambulatory Visit: Payer: Self-pay | Admitting: Physician Assistant

## 2020-08-08 DIAGNOSIS — M545 Low back pain, unspecified: Secondary | ICD-10-CM

## 2020-08-08 DIAGNOSIS — M62830 Muscle spasm of back: Secondary | ICD-10-CM

## 2020-08-12 ENCOUNTER — Other Ambulatory Visit: Payer: Self-pay | Admitting: Physician Assistant

## 2020-08-12 DIAGNOSIS — K580 Irritable bowel syndrome with diarrhea: Secondary | ICD-10-CM

## 2020-08-23 ENCOUNTER — Encounter: Payer: Self-pay | Admitting: Physician Assistant

## 2020-08-23 ENCOUNTER — Ambulatory Visit (INDEPENDENT_AMBULATORY_CARE_PROVIDER_SITE_OTHER): Payer: Medicare HMO | Admitting: Physician Assistant

## 2020-08-23 ENCOUNTER — Other Ambulatory Visit: Payer: Self-pay

## 2020-08-23 VITALS — BP 137/71 | HR 84 | Temp 97.8°F | Ht 71.0 in | Wt 247.0 lb

## 2020-08-23 DIAGNOSIS — G4733 Obstructive sleep apnea (adult) (pediatric): Secondary | ICD-10-CM | POA: Diagnosis not present

## 2020-08-23 DIAGNOSIS — E6609 Other obesity due to excess calories: Secondary | ICD-10-CM

## 2020-08-23 DIAGNOSIS — J984 Other disorders of lung: Secondary | ICD-10-CM | POA: Diagnosis not present

## 2020-08-23 DIAGNOSIS — J329 Chronic sinusitis, unspecified: Secondary | ICD-10-CM

## 2020-08-23 DIAGNOSIS — R0602 Shortness of breath: Secondary | ICD-10-CM | POA: Diagnosis not present

## 2020-08-23 DIAGNOSIS — Z6834 Body mass index (BMI) 34.0-34.9, adult: Secondary | ICD-10-CM | POA: Diagnosis not present

## 2020-08-23 DIAGNOSIS — J40 Bronchitis, not specified as acute or chronic: Secondary | ICD-10-CM | POA: Diagnosis not present

## 2020-08-23 DIAGNOSIS — Z9989 Dependence on other enabling machines and devices: Secondary | ICD-10-CM | POA: Diagnosis not present

## 2020-08-23 DIAGNOSIS — R0902 Hypoxemia: Secondary | ICD-10-CM

## 2020-08-23 MED ORDER — AMOXICILLIN-POT CLAVULANATE 875-125 MG PO TABS: 1.0000 | ORAL_TABLET | Freq: Two times a day (BID) | ORAL | 0 refills | Status: AC

## 2020-08-23 MED ORDER — BREO ELLIPTA 100-25 MCG/INH IN AEPB: 1.0000 | INHALATION_SPRAY | Freq: Every day | RESPIRATORY_TRACT | 5 refills | Status: DC

## 2020-08-23 MED ORDER — METHYLPREDNISOLONE ACETATE 80 MG/ML IJ SUSP
40.0000 mg | Freq: Once | INTRAMUSCULAR | Status: AC
  Administered 2020-08-23: 40 mg via INTRAMUSCULAR

## 2020-08-23 MED ORDER — WEGOVY 0.25 MG/0.5ML ~~LOC~~ SOAJ: 0.2500 mg | SUBCUTANEOUS | 0 refills | Status: DC

## 2020-08-23 MED ORDER — ALBUTEROL SULFATE HFA 108 (90 BASE) MCG/ACT IN AERS: 2.0000 | INHALATION_SPRAY | Freq: Four times a day (QID) | RESPIRATORY_TRACT | 0 refills | Status: DC | PRN

## 2020-08-23 MED ORDER — METHYLPREDNISOLONE SODIUM SUCC 125 MG IJ SOLR
125.0000 mg | Freq: Once | INTRAMUSCULAR | Status: AC
  Administered 2020-08-23: 125 mg via INTRAMUSCULAR

## 2020-08-23 NOTE — Patient Instructions (Addendum)
[place]- ask about wellbutrin suststained release to potentially help with weight loss Start wegovy. Follow up in 3 months.  Referral to new pulmonologist.  augmentin only if symptoms persist past a week.

## 2020-08-23 NOTE — Progress Notes (Signed)
Subjective:    Patient ID: Jake [name], male    DOB: Mar 23, 1961, 59 y.o.   MRN: [id number]  HPI  Pt is a an obese male with restrictive airway disease on spironmetry and GERD, OSA, CAD, Bipolar who presents to the clinic the clinic with 4-5 days of productive cough, SOB worsening, sinus pressure, headache ear popping. No fever, chills, body aches, loss of smell or taste, GI symptoms. He is covid vaccinated. He does admit he ran out of BREO and albuterol inhaler. He is taking tylenol cold sinus relief with minimal benefit. He admits at times his O2 stats go below 90. He wants 2nd opinion with pulmonology. He was feeling a little better and breathing better with weight loss. He has now gained it all back.   On CPAP every night.   .. Active Ambulatory Problems    Diagnosis Date Noted  . Low back pain 05/05/2016  . Muscle spasm of back 05/05/2016  . Hyperlipidemia 05/19/2016  . Hypertriglyceridemia 05/19/2016  . Diverticulosis of colon without hemorrhage 05/19/2016  . IBS (irritable bowel syndrome) 05/19/2016  . GERD (gastroesophageal reflux disease) 05/19/2016  . Allergic rhinitis 05/19/2016  . OSA on CPAP 05/19/2016  . Generalized anxiety disorder 05/19/2016  . Mood changes 05/19/2016  . Atelectasis of both lungs 11/20/2016  . 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 History of pancreatitis 05/27/2017  . Diverticulitis 05/27/2017  . Elevated fasting glucose 06/10/2017  . Low testosterone 06/10/2017  . Benign prostatic hyperplasia (BPH) with urinary urgency 06/12/2017  . No energy 06/12/2017  . Former smoker 07/25/2017  . Cough 07/25/2017  . 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 Non-restorative sleep 07/25/2017  . Anxiety 10/30/2015  . Chronic nausea 10/30/2015  . Major depressive disorder 10/30/2015  . Migraine 10/30/2015  . Neck pain 10/30/2015  . Occipital neuralgia 10/30/2015  . Substance abuse in remission ([place]) 04/07/2018  . Bipolar disorder ([place]) 07/04/2018  .  Hearing difficulty of right ear 01/16/2019  . IFG (impaired fasting glucose) 03/20/2019  . Coronary artery disease due to calcified coronary lesion 11/03/2019  . Thoracic aorta atherosclerosis ([place]) 11/03/2019  . Aortic atherosclerosis ([place]) 11/03/2019  . Hepatic steatosis 11/03/2019  . SOB (shortness of breath) on exertion 11/06/2019  . Precordial pain 11/06/2019  . Essential hypertension 11/06/2019  . Obesity (BMI 30-39.9) 11/06/2019  . Aneurysm of ascending aorta (HCC) 11/10/2019  . Hypoxia 02/21/2020  . 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/2020  . Sinobronchitis 07/10/2020   Resolved Ambulatory Problems    Diagnosis Date Noted  . Essential hypertension, benign 05/19/2016  . Community acquired pneumonia of left lower lobe of lung 11/20/2016  . Impacted cerumen of right ear 01/16/2019   Past Medical History:  Diagnosis Date  . Arthritis   . Hypertension       Review of Systems See HPI.     Objective:   Physical Exam Vitals reviewed.  Constitutional:      Appearance: Normal appearance. He is obese.  HENT:     Head: Normocephalic.     Comments: Tenderness over the maxillary sinuses to palpation.     Right Ear: Tympanic membrane and external ear normal.     Left Ear: Tympanic membrane and external ear normal.     Nose: Nose normal.     Mouth/Throat:     Mouth: Mucous membranes are moist.     Pharynx: Posterior oropharyngeal erythema present.  Eyes:     Conjunctiva/sclera: Conjunctivae normal.  Cardiovascular:     Rate and  Rhythm: Normal rate and regular rhythm.     Pulses: Normal pulses.     Heart sounds: Normal heart sounds.  Pulmonary:     Effort: Pulmonary effort is normal.     Breath sounds: Rhonchi present.  Lymphadenopathy:     Cervical: Cervical adenopathy present.  Neurological:     General: No focal deficit present.     Mental Status: He is alert and oriented to person, place, and time.  Psychiatric:        Mood and Affect: Mood normal.            Assessment & Plan:  [name] KitchenMarland KitchenXavious was seen today for sinus problem.  Diagnoses and all orders for this visit:  Sinobronchitis -     amoxicillin-clavulanate (AUGMENTIN) 875-125 MG tablet; Take 1 tablet by mouth 2 (two) times daily for 10 days. -     methylPREDNISolone sodium succinate (SOLU-MEDROL) 125 mg/2 mL injection 125 mg -     methylPREDNISolone acetate (DEPO-MEDROL) injection 40 mg  SOB (shortness of breath) -     fluticasone furoate-vilanterol (BREO ELLIPTA) 100-25 MCG/INH AEPB; Inhale 1 puff into the lungs daily. -     albuterol (VENTOLIN HFA) 108 (90 Base) MCG/ACT inhaler; Inhale 2 puffs into the lungs every 6 (six) hours as needed. -     methylPREDNISolone sodium succinate (SOLU-MEDROL) 125 mg/2 mL injection 125 mg -     methylPREDNISolone acetate (DEPO-MEDROL) injection 40 mg -     Ambulatory referral to Pulmonology  Hypoxia -     fluticasone furoate-vilanterol (BREO ELLIPTA) 100-25 MCG/INH AEPB; Inhale 1 puff into the lungs daily. -     albuterol (VENTOLIN HFA) 108 (90 Base) MCG/ACT inhaler; Inhale 2 puffs into the lungs every 6 (six) hours as needed. -     methylPREDNISolone sodium succinate (SOLU-MEDROL) 125 mg/2 mL injection 125 mg -     methylPREDNISolone acetate (DEPO-MEDROL) injection 40 mg -     Ambulatory referral to Pulmonology  Restrictive airway disease -     fluticasone furoate-vilanterol (BREO ELLIPTA) 100-25 MCG/INH AEPB; Inhale 1 puff into the lungs daily. -     albuterol (VENTOLIN HFA) 108 (90 Base) MCG/ACT inhaler; Inhale 2 puffs into the lungs every 6 (six) hours as needed. -     Ambulatory referral to Pulmonology  OSA on CPAP -     Ambulatory referral to Pulmonology  Class 1 obesity due to excess calories with serious comorbidity and body mass index (BMI) of 34.0 to 34.9 in adult -     Semaglutide-Weight Management (WEGOVY) 0.25 MG/0.5ML SOAJ; Inject 0.25 mg into the skin once a week.   Sinobronchitis treated with augmentin, depo medrol, solumedrol.  Hold augmentin for a few more days to see if adding back albuterol and BREO helps as well.  Continue OSA treatment with CPAP.   Needs second opinion on SOB and hypoxia. He reports O2 stats going below 90 at home from time to time. Fairly recent CT report. He has had pulmonary function test that show a slight restrictive pattern. It was suggested that it was due to his weight. He does admit he felt like symptoms were better when he was smaller and losing weight.   [name] Kitchen.Discussed low carb diet with 1500 calories and 80g of protein.  Exercising at least 150 minutes a week.  [employer] could be a Microbiologist.  I suggested we try to lose weight.  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  Discussed wegovy. Will  try. Nausea may be to bad but willing to try it. Discussed titration up and how to use.  Follow up in 3 months.

## 2020-08-27 ENCOUNTER — Other Ambulatory Visit: Payer: Self-pay | Admitting: Physician Assistant

## 2020-08-28 ENCOUNTER — Other Ambulatory Visit: Payer: Self-pay | Admitting: Physician Assistant

## 2020-09-02 DIAGNOSIS — G4733 Obstructive sleep apnea (adult) (pediatric): Secondary | ICD-10-CM | POA: Diagnosis not present

## 2020-09-04 ENCOUNTER — Ambulatory Visit (INDEPENDENT_AMBULATORY_CARE_PROVIDER_SITE_OTHER): Payer: Medicare HMO | Admitting: Physician Assistant

## 2020-09-04 ENCOUNTER — Encounter: Payer: Self-pay | Admitting: Physician Assistant

## 2020-09-04 ENCOUNTER — Other Ambulatory Visit: Payer: Self-pay

## 2020-09-04 ENCOUNTER — Telehealth: Payer: Self-pay | Admitting: Physician Assistant

## 2020-09-04 VITALS — BP 142/98 | HR 92 | Wt 248.0 lb

## 2020-09-04 DIAGNOSIS — R0902 Hypoxemia: Secondary | ICD-10-CM

## 2020-09-04 DIAGNOSIS — R519 Headache, unspecified: Secondary | ICD-10-CM

## 2020-09-04 DIAGNOSIS — R0602 Shortness of breath: Secondary | ICD-10-CM | POA: Diagnosis not present

## 2020-09-04 MED ORDER — KETOROLAC TROMETHAMINE 60 MG/2ML IM SOLN
60.0000 mg | Freq: Once | INTRAMUSCULAR | Status: AC
  Administered 2020-09-04: 60 mg via INTRAMUSCULAR

## 2020-09-04 NOTE — Telephone Encounter (Signed)
Pt is coming in this afternoon

## 2020-09-04 NOTE — Telephone Encounter (Signed)
Pt calls and states his oxygen level is running 90-93 off his O2 and in the 97-98% on Oxygen. Is light headed and having SOB. Please advise KG LPN

## 2020-09-04 NOTE — Progress Notes (Signed)
Subjective:    Patient ID: Jake [name], male    DOB: 1961-01-23, 59 y.o.   MRN: [id number]  HPI  Patient is a 59 year old obese male with bipolar disorder, hypertension, OSA, CAD, restrictive lung patterns, hypoxia and shortness of breath.  He is accompanied by his wife.  He is concerned because he feels increasingly short of breath and his O2 sats at home at times will go into the 80s.  He is stable around 90 to 93%.  He off-and-on uses his oxygen when needed.  It seems like when he takes good deep breaths his O2 sats to go up.  He does have a headache for the last 2 days.  He denies any fever, chills, body aches, sinus pressure, sore throat, ear pain, cough.  Denies any lower extremity edema.  He is using his CPAP at home.  He did get removed [employer]ips CPAP and feels like it is much louder and annoying than his previous one.  He has had this shortness of breath on exertion and hypoxia ongoing for many months now.  He did see pulmonology and they suggested weight was causing his shortness of breath.  He did lose some weight and it did improve minimally.  He has gained weight that and having many more problems.  His inhalers do not seem to help.  He does have an upcoming pulmonology appointment.  He has been seen by cardiology.  He has had a CT of his lungs done this year with no acute findings.  .. Active Ambulatory Problems    Diagnosis Date Noted  . Low back pain 05/05/2016  . Muscle spasm of back 05/05/2016  . Hyperlipidemia 05/19/2016  . Hypertriglyceridemia 05/19/2016  . Diverticulosis of colon without hemorrhage 05/19/2016  . IBS (irritable bowel syndrome) 05/19/2016  . GERD (gastroesophageal reflux disease) 05/19/2016  . Allergic rhinitis 05/19/2016  . OSA on CPAP 05/19/2016  . Generalized anxiety disorder 05/19/2016  . Mood changes 05/19/2016  . Atelectasis of both lungs 11/20/2016  . 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HCC) 05/27/2017  . History of pancreatitis 05/27/2017  . Diverticulitis  05/27/2017  . Elevated fasting glucose 06/10/2017  . Low testosterone 06/10/2017  . Benign prostatic hyperplasia (BPH) with urinary urgency 06/12/2017  . No energy 06/12/2017  . Former smoker 07/25/2017  . Cough 07/25/2017  . 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 Non-restorative sleep 07/25/2017  . Anxiety 10/30/2015  . Chronic nausea 10/30/2015  . Major depressive disorder 10/30/2015  . Migraine 10/30/2015  . Neck pain 10/30/2015  . Occipital neuralgia 10/30/2015  . Substance abuse in remission (HCC) 04/07/2018  . Bipolar disorder (HCC) 07/04/2018  . Hearing difficulty of right ear 01/16/2019  . IFG (impaired fasting glucose) 03/20/2019  . Coronary artery disease due to calcified coronary lesion 11/03/2019  . Thoracic aorta atherosclerosis (HCC) 11/03/2019  . Aortic atherosclerosis (HCC) 11/03/2019  . Hepatic steatosis 11/03/2019  . SOB (shortness of breath) on exertion 11/06/2019  . Precordial pain 11/06/2019  . Essential hypertension 11/06/2019  . Obesity (BMI 30-39.9) 11/06/2019  . Aneurysm of ascending aorta (HCC) 11/10/2019  . Hypoxia 02/21/2020  . 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/2020  . Sinobronchitis 07/10/2020   Resolved Ambulatory Problems    Diagnosis Date Noted  . Essential hypertension, benign 05/19/2016  . Community acquired pneumonia of left lower lobe of lung 11/20/2016  . Impacted cerumen of right ear 01/16/2019   Past Medical History:  Diagnosis Date  . Arthritis   . Hypertension  Review of Systems See HPI.     Objective:   Physical Exam Vitals reviewed.  Constitutional:      Appearance: Normal appearance. He is obese.  HENT:     Head: Normocephalic.     Right Ear: Tympanic membrane normal.     Left Ear: Tympanic membrane normal.     Nose: Nose normal.     Mouth/Throat:     Mouth: Mucous membranes are moist.  Eyes:     Conjunctiva/sclera: Conjunctivae normal.     Pupils: Pupils are equal, round, and reactive to light.  Neck:      Vascular: No carotid bruit.  Cardiovascular:     Rate and Rhythm: Normal rate and regular rhythm.     Pulses: Normal pulses.     Heart sounds: Normal heart sounds.  Pulmonary:     Effort: No respiratory distress.     Breath sounds: Normal breath sounds. No wheezing, rhonchi or rales.     Comments: No respiratory distress but breathing is short and rapid like he is not taking full deep breaths.  Musculoskeletal:     Cervical back: Normal range of motion and neck supple.     Right lower leg: No edema.     Left lower leg: No edema.  Neurological:     General: No focal deficit present.     Mental Status: He is alert and oriented to person, place, and time.  Psychiatric:        Mood and Affect: Mood normal.        Behavior: Behavior normal.           Assessment & Plan:  [name] KitchenMarland KitchenJerrel was seen today for o2 level dropping.  Diagnoses and all orders for this visit:  SOB (shortness of breath) -     Novel Coronavirus, NAA ([facility]) -     SARS-COV-2, NAA 2 DAY TAT  Hypoxia -     Novel Coronavirus, NAA ([facility]) -     SARS-COV-2, NAA 2 DAY TAT  Acute intractable headache, unspecified headache type -     Novel Coronavirus, NAA ([facility]) -     ketorolac (TORADOL) injection 60 mg -     SARS-COV-2, NAA 2 DAY TAT   Pt has noted restrictive lung changes on spirometry but previous pulmonologist stated was due to weight. He admits losing weight did help his symptoms some but over the holidays gained it back. He does have pulmonology appt coming up for a second opinion.   He is having more problems with SOB and hypoxia at home. Using O2 2L at home which helps. Will test for covid. No fever or body aches to suggest flu. Toradol given for headache.    Continue to try to lose weight to see how that affects your breathing. Did not start wegovy due to nausea as a side effect and his hx of nausea.  Work on good deep breaths and not shallow breathing. Continue using CPAP at night. ICS/LABA/LAMA  inhalers not helping. Stay off for now. Still can use albuterol for rescue. CT scan done this year no acute findings. Cardiology work done no acute findings.   BP up today. Increase cozaar to 2 tablets and continue this dosing daily until BP under 140/90. Follow up in 1 month for recheck.  I do wonder if breathing has anything to do with mental health medications. Will table until next pulmonology appt.   Spent 30 minutes with patient reassuring him on vital signs, testing, and treatment plan.

## 2020-09-04 NOTE — Patient Instructions (Signed)
Take to of cozaar today and if BP above 140/90 take 2 again next day.

## 2020-09-05 ENCOUNTER — Other Ambulatory Visit: Payer: Self-pay | Admitting: Physician Assistant

## 2020-09-06 LAB — SARS-COV-2, NAA 2 DAY TAT

## 2020-09-06 LAB — NOVEL CORONAVIRUS, NAA: SARS-CoV-2, NAA: NOT DETECTED

## 2020-09-08 NOTE — Progress Notes (Signed)
Negative for covid

## 2020-09-09 ENCOUNTER — Encounter: Payer: Self-pay | Admitting: Physician Assistant

## 2020-09-16 ENCOUNTER — Telehealth: Payer: Self-pay | Admitting: Neurology

## 2020-09-16 NOTE — Telephone Encounter (Signed)
Prior Authorization for Livalo submitted via covermymeds. Awaiting response. Your information has been submitted to [employer]. [employer] will review the request and will issue a decision, typically within 1-3 days from your submission. You can check the updated outcome later by reopening this request.  If [employer] has not responded in 1-3 days or if you have any questions about your ePA request, please contact [employer] at [phone]. If you think there may be a problem with your PA request, use our live chat feature at the bottom right. 

## 2020-09-20 ENCOUNTER — Other Ambulatory Visit: Payer: Self-pay | Admitting: Physician Assistant

## 2020-09-23 ENCOUNTER — Institutional Professional Consult (permissible substitution): Payer: Medicare HMO | Admitting: Pulmonary Disease

## 2020-09-23 DIAGNOSIS — R0602 Shortness of breath: Secondary | ICD-10-CM | POA: Diagnosis not present

## 2020-09-23 DIAGNOSIS — R0902 Hypoxemia: Secondary | ICD-10-CM | POA: Diagnosis not present

## 2020-09-23 NOTE — Telephone Encounter (Signed)
PA approval for Livalo from 09/07/20-09/06/21. Approval will remain the same if patient remains enrolled in Med Pt D, physician continues to prescribe the medication and the medication continues to be safe for treating your conditions. [place] has been updated.

## 2020-10-07 ENCOUNTER — Telehealth: Payer: Self-pay | Admitting: Neurology

## 2020-10-07 NOTE — Telephone Encounter (Signed)
Spoke with patient, he states sore throat is actually better. No other symptoms. He will keep appt for pulmonology on 10/15/2020 and let us know if anything changes.

## 2020-10-07 NOTE — Telephone Encounter (Signed)
Patient left vm stating he has a sore throat, tested negative for Covid at home yesterday.  His oxygen 95-96 on oxygen, wanted to know if Luvenia Starch wanted to listen to his lungs? Did you want to do a virtual to talk with him?

## 2020-10-07 NOTE — Telephone Encounter (Signed)
I certainly don't mind but O2 stats looking good. You should have pulmonology appt coming up right? If you had any other symptoms beside sore throat let me know.

## 2020-10-15 ENCOUNTER — Encounter: Payer: Self-pay | Admitting: Pulmonary Disease

## 2020-10-15 ENCOUNTER — Ambulatory Visit (INDEPENDENT_AMBULATORY_CARE_PROVIDER_SITE_OTHER): Payer: Medicare HMO | Admitting: Pulmonary Disease

## 2020-10-15 ENCOUNTER — Other Ambulatory Visit: Payer: Self-pay

## 2020-10-15 VITALS — BP 128/86 | HR 92 | Temp 98.4°F | Ht 71.0 in | Wt 250.0 lb

## 2020-10-15 DIAGNOSIS — J84112 Idiopathic pulmonary fibrosis: Secondary | ICD-10-CM

## 2020-10-15 DIAGNOSIS — R0602 Shortness of breath: Secondary | ICD-10-CM

## 2020-10-15 NOTE — Progress Notes (Signed)
Jake [name]    [id number]    1961-08-13  Primary Care Physician:Breeback, Royetta Car, PA-C  Referring Physician: Donella Stade, PA-C [address] [place],  [place] [id number]  Chief complaint:  Shortness of breath for 1 to 2 years with exercise desaturations  HPI:  His saturations will drop into the low 80s with minimal activity  Did follow-up with pulmonary at [facility] -PFT with mild restriction -Echo was normal -Stress test had to be done chemically but showed no significant finding  Initially when symptoms started about a year and a half ago he did have an echocardiogram and chemical stress test which were unremarkable  Himself and his spouse stated that about January 2020 they did have what was felt to be a upper respiratory tract infection that they both recovered from and its been since that illness that he has had persistent and progressive symptoms  Quit smoking over 10 years ago, was smoking about 2 packs a day -Breathing study did not show evidence of COPD -Did not see any improvement in his breathing with inhalers, albuterol does help a little bit  Get short of breath with less than 50 yards  He does have a dry cough  Occupational history -Some exposure to asbestos in the building for few years, was not working with asbestos Worked at [employer], office work  History of obstructive sleep apnea that is well controlled Compliant -On auto titrating CPAP -CPAP helps with his sleep  No significant musculoskeletal pain contributing to his limitations  He does have oxygen tanks at home, does not use them all the time  Has not been able to exercise regularly as he gets very winded easily  Outpatient Encounter Medications as of 10/15/2020  Medication Sig  . albuterol (VENTOLIN HFA) 108 (90 Base) MCG/ACT inhaler Inhale 2 puffs into the lungs every 6 (six) hours as needed.  . AMBULATORY NON FORMULARY MEDICATION Continuous positive  airway pressure (CPAP) machine set at autotitration of H2O pressure, with all supplemental supplies as needed.  [name] Kitchen buPROPion (WELLBUTRIN XL) 150 MG 24 hr tablet Take 1 tablet (150 mg total) by mouth daily.  . colestipol (COLESTID) 1 g tablet Take 1 tablet by mouth once daily  . dicyclomine (BENTYL) 20 MG tablet TAKE 1 TABLET BY MOUTH THREE TIMES DAILY  . ibuprofen (ADVIL) 800 MG tablet TAKE 1 TABLET BY MOUTH EVERY 8 HOURS AS NEEDED  . loratadine (CLARITIN) 10 MG tablet Take 10 mg by mouth daily.  [name] Kitchen losartan (COZAAR) 50 MG tablet Take 1 tablet by mouth once daily  . Multiple Vitamin (MULTIVITAMIN) tablet Take 1 tablet by mouth daily.   [name] Kitchen OLANZapine (ZYPREXA) 7.5 MG tablet Take 1 tablet (7.5 mg total) by mouth at bedtime.  . Omega-3 Fatty Acids (FISH OIL) 1000 MG CAPS Take 2,000 mg by mouth daily.  . ondansetron (ZOFRAN) 8 MG tablet TAKE 1 TABLET BY MOUTH EVERY 8 HOURS AS NEEDED FOR NAUSEA FOR VOMITING  . pantoprazole (PROTONIX) 40 MG tablet Take 1 tablet by mouth twice daily  . sertraline (ZOLOFT) 100 MG tablet TAKE 1 & 1/2 (ONE & ONE-HALF) TABLETS BY MOUTH ONCE DAILY  . sodium bicarbonate 650 MG tablet Take 1 tablet by mouth 4 times daily  . tamsulosin (FLOMAX) 0.4 MG CAPS capsule Take 1 capsule by mouth once daily  . ZYPITAMAG 2 MG TABS TAKE 1 TABLET BY MOUTH DAILY.  . [DISCONTINUED] lidocaine (XYLOCAINE) 5 % ointment  Apply 1 application topically 4 (four) times daily as needed.  . [DISCONTINUED] nitroGLYCERIN (NITROSTAT) 0.4 MG SL tablet Place 1 tablet (0.4 mg total) under the tongue every 5 (five) minutes as needed. (Patient not taking: Reported on 08/23/2020)  . [DISCONTINUED] valACYclovir (VALTREX) 1000 MG tablet Take 1 tablet (1,000 mg total) by mouth 3 (three) times daily. For 7 days.   No facility-administered encounter medications on file as of 10/15/2020.    Allergies as of 10/15/2020 - Review Complete 10/15/2020  Allergen Reaction Noted  . Fentanyl Other (See Comments) 04/06/2018  .  Other  04/06/2018  . Aspirin Other (See Comments) 08/29/2015  . Lipitor [atorvastatin]  11/03/2019  . Prednisone  09/27/2019  . Contrast media [iodinated diagnostic agents] Palpitations 05/04/2016  . Nitroglycerin Nausea And Vomiting 05/04/2016    Past Medical History:  Diagnosis Date  . Arthritis    lower back  . Bipolar disorder ([place])   . GERD (gastroesophageal reflux disease)   . Hyperlipidemia   . Hypertension   . Substance abuse in remission ([place]) 04/06/2018   AVOID OPIATES!    Past Surgical History:  Procedure Laterality Date  . ANKLE FRACTURE SURGERY Left   . APPENDECTOMY    . CERVICAL FUSION    . CHOLECYSTECTOMY    . COLON SURGERY    . HERNIA REPAIR      Family History  Problem Relation Age of Onset  . Cancer Mother        breast  . Depression Mother   . Hyperlipidemia Mother   . Alcohol abuse Father   . Cancer Father        skin  . Hyperlipidemia Father   . Alzheimer's disease Father   . Cancer Maternal Grandfather   . Cancer Paternal Grandmother     Social History   Socioeconomic History  . Marital status: Married    Spouse name: Dawn  . Number of children: 1  . Years of education: 22  . Highest education level: Not on file  Occupational History  . Occupation: part time    Comment: [employer]  Tobacco Use  . Smoking status: Former Research scientist (life sciences)  . Smokeless tobacco: Never Used  Vaping Use  . Vaping Use: Never used  Substance and Sexual Activity  . Alcohol use: No  . Drug use: No    Comment: 04/06/2018 Patient's wife wanted [place] to be very aware that her husband is a recovering narcotic abuser  . Sexual activity: Yes  Other Topics Concern  . Not on file  Social History Narrative   Works part time for [employer] Does not exercise due to back issues in the past and strenous job. Daily caffeine use 2-3 drinks a day.   Social Determinants of Health   Financial Resource Strain: Not on file  Food Insecurity: Not on file  Transportation Needs: Not  on file  Physical Activity: Not on file  Stress: Not on file  Social Connections: Not on file  Intimate Partner Violence: Not on file    Review of Systems  Constitutional: Positive for fatigue.  Respiratory: Positive for shortness of breath. Negative for chest tightness and wheezing.   Cardiovascular: Negative for chest pain.    Vitals:   10/15/20 1339  BP: 128/86  Pulse: 92  Temp: 98.4 F (36.9 C)  SpO2: 95%     Physical Exam Constitutional:      Appearance: He is obese.  HENT:     Head: Normocephalic.     Mouth/Throat:  Mouth: Mucous membranes are moist.  Eyes:     Pupils: Pupils are equal, round, and reactive to light.  Cardiovascular:     Rate and Rhythm: Normal rate and regular rhythm.     Heart sounds: No murmur heard. No friction rub.  Pulmonary:     Effort: No respiratory distress.     Breath sounds: No stridor. No wheezing or rhonchi.  Musculoskeletal:     Cervical back: No rigidity or tenderness.  Neurological:     Mental Status: He is alert.  Psychiatric:        Mood and Affect: Mood normal.    Data Reviewed: Records from [facility] reviewed -Pulmonary function test interpreted as restrictive  CT scan of the chest did not show any significant abnormality -CT from 11/03/2019 reviewed-mild coronary calcifications -There was mention of some fibrosis  Patient was ambulated in the office today to see whether he desaturated significantly -He only went as low as 89% and recovered quickly  Assessment:  Persistent shortness of breath  Possibility of interstitial lung disease  With no background history of lung disease, his symptoms seem out of context especially with an echocardiogram that was normal, a stress test that was normal and no documented history of lung disease, PFT only showing mild restriction that was felt to be related to body habitus  We did discuss possibility of a cardiopulmonary exercise study however, will obtain some studies  and make a referral for cardiopulmonary exercise study as well    Plan/Recommendations: Obtain high-resolution CT scan of the chest  Schedule for pulmonary function test  Last echocardiogram was about 2 years ago We will repeat this  Graded exercises was discussed  Importance of using his oxygen with activity especially activities that are associated with desaturations  Weight loss efforts discussed  Referral for cardiopulmonary exercise study  I will see him back in the office in 4 to 6 weeks   Sherrilyn Rist MD [facility] 10/15/2020, 2:12 PM  CC: Donella Stade, PA-C

## 2020-10-15 NOTE — Patient Instructions (Addendum)
Shortness of breath Exercise desaturations  Referral for cardiopulmonary exercise stress test  Exercise desaturation study Pulmonary function test  Continue with albuterol as needed  Graded exercises with oxygen supplementation  We will follow-up in 4 to 6 weeks  Order high-resolution CT scan of the chest Order pulmonary function test Order echocardiogram

## 2020-10-17 ENCOUNTER — Other Ambulatory Visit: Payer: Self-pay | Admitting: Physician Assistant

## 2020-10-17 DIAGNOSIS — I10 Essential (primary) hypertension: Secondary | ICD-10-CM

## 2020-10-18 ENCOUNTER — Ambulatory Visit (INDEPENDENT_AMBULATORY_CARE_PROVIDER_SITE_OTHER): Payer: Medicare HMO | Admitting: Sports Medicine

## 2020-10-18 DIAGNOSIS — Z0000 Encounter for general adult medical examination without abnormal findings: Secondary | ICD-10-CM

## 2020-10-18 DIAGNOSIS — Z1211 Encounter for screening for malignant neoplasm of colon: Secondary | ICD-10-CM

## 2020-10-18 NOTE — Addendum Note (Signed)
Addended byAnnamaria Helling on: 10/18/2020 03:39 PM   Modules accepted: Orders

## 2020-10-18 NOTE — Patient Instructions (Addendum)
Colonoscopy, Adult A colonoscopy is a procedure to look at the entire large intestine. This procedure is done using a long, thin, flexible tube that has a camera on the end. You may have a colonoscopy:  As a part of normal colorectal screening.  If you have certain symptoms, such as: ? A low number of red blood cells in your blood (anemia). ? Diarrhea that does not go away. ? Pain in your abdomen. ? Blood in your stool. A colonoscopy can help screen for and diagnose medical problems, including:  Tumors.  Extra tissue that grows where mucus forms (polyps).  Inflammation.  Areas of bleeding. Tell your health care provider about:  Any allergies you have.  All medicines you are taking, including vitamins, herbs, eye drops, creams, and over-the-counter medicines.  Any problems you or family members have had with anesthetic medicines.  Any blood disorders you have.  Any surgeries you have had.  Any medical conditions you have.  Any problems you have had with having bowel movements.  Whether you are pregnant or may be pregnant. What are the risks? Generally, this is a safe procedure. However, problems may occur, including:  Bleeding.  Damage to your intestine.  Allergic reactions to medicines given during the procedure.  Infection. This is rare. What happens before the procedure? Eating and drinking restrictions Follow instructions from your health care provider about eating or drinking restrictions, which may include:  A few days before the procedure: ? Follow a low-fiber diet. ? Avoid nuts, seeds, dried fruit, raw fruits, and vegetables.  1-3 days before the procedure: ? Eat only gelatin dessert or ice pops. ? Drink only clear liquids, such as water, clear juice, clear broth or bouillon, black coffee or tea, or clear soft drinks or sports drinks. ? Avoid liquids that contain red or purple dye.  The day of the procedure: ? Do not eat solid foods. You may  continue to drink clear liquids until up to 2 hours before the procedure. ? Do not eat or drink anything starting 2 hours before the procedure, or within the time period that your health care provider recommends. Bowel prep If you were prescribed a bowel prep to take by mouth (orally) to clean out your colon:  Take it as told by your health care provider. Starting the day before your procedure, you will need to drink a large amount of liquid medicine. The liquid will cause you to have many bowel movements of loose stool until your stool becomes almost clear or light green.  If your skin or the opening between the buttocks (anus) gets irritated from diarrhea, you may relieve the irritation using: ? Wipes with medicine in them, such as adult wet wipes with aloe and vitamin E. ? A product to soothe skin, such as petroleum jelly.  If you vomit while drinking the bowel prep: ? Take a break for up to 60 minutes. ? Begin the bowel prep again. ? Call your health care provider if you keep vomiting or you cannot take the bowel prep without vomiting.  To clean out your colon, you may also be given: ? Laxative medicines. These help you have a bowel movement. ? Instructions for enema use. An enema is liquid medicine injected into your rectum. Medicines Ask your health care provider about:  Changing or stopping your regular medicines or supplements. This is especially important if you are taking iron supplements, diabetes medicines, or blood thinners.  Taking medicines such as aspirin and ibuprofen. These  medicines can thin your blood. Do not take these medicines unless your health care provider tells you to take them.  Taking over-the-counter medicines, vitamins, herbs, and supplements. General instructions  Ask your health care provider what steps will be taken to help prevent infection. These may include washing skin with a germ-killing soap.  Plan to have someone take you home from the hospital  or clinic. What happens during the procedure?  An IV will be inserted into one of your veins.  You may be given one or more of the following: ? A medicine to help you relax (sedative). ? A medicine to numb the area (local anesthetic). ? A medicine to make you fall asleep (general anesthetic). This is rarely needed.  You will lie on your side with your knees bent.  The tube will: ? Have oil or gel put on it (be lubricated). ? Be inserted into your anus. ? Be gently eased through all parts of your large intestine.  Air will be sent into your colon to keep it open. This may cause some pressure or cramping.  Images will be taken with the camera and will appear on a screen.  A small tissue sample may be removed to be looked at under a microscope (biopsy). The tissue may be sent to a lab for testing if any signs of problems are found.  If small polyps are found, they may be removed and checked for cancer cells.  When the procedure is finished, the tube will be removed. The procedure may vary among health care providers and hospitals.   What happens after the procedure?  Your blood pressure, heart rate, breathing rate, and blood oxygen level will be monitored until you leave the hospital or clinic.  You may have a small amount of blood in your stool.  You may pass gas and have mild cramping or bloating in your abdomen. This is caused by the air that was used to open your colon during the exam.  Do not drive for 24 hours after the procedure.  It is up to you to get the results of your procedure. Ask your health care provider, or the department that is doing the procedure, when your results will be ready. Summary  A colonoscopy is a procedure to look at the entire large intestine.  Follow instructions from your health care provider about eating and drinking before the procedure.  If you were prescribed an oral bowel prep to clean out your colon, take it as told by your health care  provider.  During the colonoscopy, a flexible tube with a camera on its end is inserted into the anus and then passed into the other parts of the large intestine. This information is not intended to replace advice given to you by your health care provider. Make sure you discuss any questions you have with your health care provider. Document Revised: 03/17/2019 Document Reviewed: 03/17/2019 Elsevier Patient Education  [place] Maintenance, Male Adopting a healthy lifestyle and getting preventive care are important in promoting health and wellness. Ask your health care provider about:  The right schedule for you to have regular tests and exams.  Things you can do on your own to prevent diseases and keep yourself healthy. What should I know about diet, weight, and exercise? Eat a healthy diet  Eat a diet that includes plenty of vegetables, fruits, low-fat dairy products, and lean protein.  Do not eat a lot of foods that are high  in solid fats, added sugars, or sodium.   Maintain a healthy weight Body mass index (BMI) is a measurement that can be used to identify possible weight problems. It estimates body fat based on height and weight. Your health care provider can help determine your BMI and help you achieve or maintain a healthy weight. Get regular exercise Get regular exercise. This is one of the most important things you can do for your health. Most adults should:  Exercise for at least 150 minutes each week. The exercise should increase your heart rate and make you sweat (moderate-intensity exercise).  Do strengthening exercises at least twice a week. This is in addition to the moderate-intensity exercise.  Spend less time sitting. Even light physical activity can be beneficial. Watch cholesterol and blood lipids Have your blood tested for lipids and cholesterol at 60 years of age, then have this test every 5 years. You may need to have your cholesterol levels  checked more often if:  Your lipid or cholesterol levels are high.  You are older than 60 years of age.  You are at high risk for heart disease. What should I know about cancer screening? Many types of cancers can be detected early and may often be prevented. Depending on your health history and family history, you may need to have cancer screening at various ages. This may include screening for:  Colorectal cancer.  Prostate cancer.  Skin cancer.  Lung cancer. What should I know about heart disease, diabetes, and high blood pressure? Blood pressure and heart disease  High blood pressure causes heart disease and increases the risk of stroke. This is more likely to develop in people who have high blood pressure readings, are of African descent, or are overweight.  Talk with your health care provider about your target blood pressure readings.  Have your blood pressure checked: ? Every 3-5 years if you are 5-38 years of age. ? Every year if you are 24 years old or older.  If you are between the ages of 59 and 39 and are a current or former smoker, ask your health care provider if you should have a one-time screening for abdominal aortic aneurysm (AAA). Diabetes Have regular diabetes screenings. This checks your fasting blood sugar level. Have the screening done:  Once every three years after age 59 if you are at a normal weight and have a low risk for diabetes.  More often and at a younger age if you are overweight or have a high risk for diabetes. What should I know about preventing infection? Hepatitis B If you have a higher risk for hepatitis B, you should be screened for this virus. Talk with your health care provider to find out if you are at risk for hepatitis B infection. Hepatitis C Blood testing is recommended for:  Everyone born from [birth year] through 1965.  Anyone with known risk factors for hepatitis C. Sexually transmitted infections (STIs)  You should be screened  each year for STIs, including gonorrhea and chlamydia, if: ? You are sexually active and are younger than 60 years of age. ? You are older than 60 years of age and your health care provider tells you that you are at risk for this type of infection. ? Your sexual activity has changed since you were last screened, and you are at increased risk for chlamydia or gonorrhea. Ask your health care provider if you are at risk.  Ask your health care provider about whether you are at high  risk for HIV. Your health care provider may recommend a prescription medicine to help prevent HIV infection. If you choose to take medicine to prevent HIV, you should first get tested for HIV. You should then be tested every 3 months for as long as you are taking the medicine. Follow these instructions at home: Lifestyle  Do not use any products that contain nicotine or tobacco, such as cigarettes, e-cigarettes, and chewing tobacco. If you need help quitting, ask your health care provider.  Do not use street drugs.  Do not share needles.  Ask your health care provider for help if you need support or information about quitting drugs. Alcohol use  Do not drink alcohol if your health care provider tells you not to drink.  If you drink alcohol: ? Limit how much you have to 0-2 drinks a day. ? Be aware of how much alcohol is in your drink. In the [place] one drink equals one 12 oz bottle of beer (355 mL), one 5 oz glass of wine (148 mL), or one 1 oz glass of hard liquor (44 mL). General instructions  Schedule regular health, dental, and eye exams.  Stay current with your vaccines.  Tell your health care provider if: ? You often feel depressed. ? You have ever been abused or do not feel safe at home. Summary  Adopting a healthy lifestyle and getting preventive care are important in promoting health and wellness.  Follow your health care provider's instructions about healthy diet, exercising, and getting tested or  screened for diseases.  Follow your health care provider's instructions on monitoring your cholesterol and blood pressure. This information is not intended to replace advice given to you by your health care provider. Make sure you discuss any questions you have with your health care provider. Document Revised: 08/17/2018 Document Reviewed: 08/17/2018 Elsevier Patient Education  2021 [place] Maintenance Summary and Written Plan of Care  Mr. Deady ,  Thank you for allowing me to perform your Medicare Annual Wellness Visit and for your ongoing commitment to your health.   Health Maintenance & Immunization History Health Maintenance  Topic Date Due  . HIV Screening  10/30/2020 (Originally 01/06/1976)  . INFLUENZA VACCINE  12/05/2020 (Originally 04/07/2020)  . COLONOSCOPY (Pts 45-53yrs Insurance coverage will need to be confirmed)  02/20/2021 (Originally 01/05/2006)  . TETANUS/TDAP  04/14/2025  . COVID-19 Vaccine  Completed  . Hepatitis C Screening  Completed   Immunization History  Administered Date(s) Administered  . Influenza,inj,Quad PF,6+ Mos 07/16/2014, 07/21/2017, 08/22/2018, 06/13/2019  . Influenza-Unspecified 07/16/2014, 06/14/2016  . PFIZER(Purple Top)SARS-COV-2 Vaccination 11/21/2019, 12/24/2019, 06/23/2020  . Td 04/15/2015  . Tdap 10/10/2009    These are the patient goals that we discussed: Goals Addressed              This Visit's Progress   .  Patient Stated (pt-stated)        10/18/2020 AWV Goal: Exercise for General Health   Patient will verbalize understanding of the benefits of increased physical activity:  Exercising regularly is important. It will improve your overall fitness, flexibility, and endurance.  Regular exercise also will improve your overall health. It can help you control your weight, reduce stress, and improve your bone density.  Over the next year, patient will increase physical activity as  tolerated with a goal of at least 150 minutes of moderate physical activity per week.   You can tell that you are exercising at a  moderate intensity if your heart starts beating faster and you start breathing faster but can still hold a conversation.  Moderate-intensity exercise ideas include:  Walking 1 mile (1.6 km) in about 15 minutes  Biking  Hiking  Golfing  Dancing  Water aerobics  Patient will verbalize understanding of everyday activities that increase physical activity by providing examples like the following: ? Yard work, such as: ? Pushing a Conservation officer, nature ? Raking and bagging leaves ? Washing your car ? Pushing a stroller ? Shoveling snow ? Gardening ? Washing windows or floors  Patient will be able to explain general safety guidelines for exercising:   Before you start a new exercise program, talk with your health care provider.  Do not exercise so much that you hurt yourself, feel dizzy, or get very short of breath.  Wear comfortable clothes and wear shoes with good support.  Drink plenty of water while you exercise to prevent dehydration or heat stroke.  Work out until your breathing and your heartbeat get faster.         This is a list of Health Maintenance Items that are overdue or due now: Colorectal cancer screening Shingrix vaccine  Orders/Referrals Placed Today: No orders of the defined types were placed in this encounter.   Follow-up Plan . Follow-up with Donella Stade, PA-C as planned . Referral for your Cologuard will be sent.  . Please schedule your shingrix vaccine appointment at the pharmacy.

## 2020-10-18 NOTE — Progress Notes (Signed)
MEDICARE ANNUAL WELLNESS VISIT  10/18/2020  Telephone Visit Disclaimer This Medicare AWV was conducted by telephone due to national recommendations for restrictions regarding the COVID-19 Pandemic (e.g. social distancing).  I verified, using two identifiers, that I am speaking with Jake [name] or their authorized healthcare agent. I discussed the limitations, risks, security, and privacy concerns of performing an evaluation and management service by telephone and the potential availability of an in-person appointment in the future. The patient expressed understanding and agreed to proceed.  Location of Patient: Home Location of Provider (nurse):  Provider home  Subjective:    Jake [name] is a 60 y.o. male patient of Alden Hipp, Royetta Car, PA-C who had a [employer] Visit today via telephone. Jake [name] is Disabled and lives with their spouse. he has 1 child. he reports that he is socially active and does interact with friends/family regularly. he is minimally physically active and enjoys spending time with his grandchildren.  Patient Care Team: Lavada Mesi as PCP - General (Family Medicine) Berniece Salines, DO as PCP - Cardiology (Cardiology) Karie Chimera, PA-C as Referring Physician (Pulmonary Disease)  Advanced Directives 10/18/2020 08/22/2018 03/16/2018 01/18/2018 06/26/2016 05/04/2016  Does Patient Have a Medical Advance Directive? No No No No No No  Would patient like information on creating a medical advance directive? No - Patient declined No - Patient declined No - Patient declined No - Patient declined No - patient declined information No - patient declined information    Hospital Utilization Over the Past 12 Months: # of hospitalizations or ER visits: 0 # of surgeries: 0  Review of Systems    Patient reports that his overall health is worse compared to last year due to the respiratory problems he is having. He is currently under treatment with  pulmonologist.  History obtained from chart review and the patient  Patient Reported Readings (BP, Pulse, CBG, Weight, etc) none  Pain Assessment Pain : No/denies pain     Current Medications & Allergies (verified) Allergies as of 10/18/2020      Reactions   Fentanyl Other (See Comments)   Avoid opiates   Other    04/06/18 Patient is a recovering narcotic abuser and, per wife, is NEVER to receive controlled substances (e.g. Narcotics, sedatives, etc.)   Aspirin Other (See Comments)   Per pt   Lipitor [atorvastatin]    GI side effects.    Prednisone    Can not tolerate oral but can tolerate shots.    Contrast Media [iodinated Diagnostic Agents] Palpitations   Nitroglycerin Nausea And Vomiting      Medication List       Accurate as of October 18, 2020  2:13 PM. If you have any questions, ask your nurse or doctor.        albuterol 108 (90 Base) MCG/ACT inhaler Commonly known as: VENTOLIN HFA Inhale 2 puffs into the lungs every 6 (six) hours as needed.   AMBULATORY NON FORMULARY MEDICATION Continuous positive airway pressure (CPAP) machine set at autotitration of H2O pressure, with all supplemental supplies as needed.   buPROPion 150 MG 24 hr tablet Commonly known as: WELLBUTRIN XL Take 1 tablet (150 mg total) by mouth daily.   colestipol 1 g tablet Commonly known as: COLESTID Take 1 tablet by mouth once daily   dicyclomine 20 MG tablet Commonly known as: BENTYL TAKE 1 TABLET BY MOUTH THREE TIMES DAILY   Fish Oil 1000 MG Caps Take 2,000 mg by mouth daily.   ibuprofen  800 MG tablet Commonly known as: ADVIL TAKE 1 TABLET BY MOUTH EVERY 8 HOURS AS NEEDED   loratadine 10 MG tablet Commonly known as: CLARITIN Take 10 mg by mouth daily.   losartan 50 MG tablet Commonly known as: COZAAR Take 1 tablet by mouth once daily   multivitamin tablet Take 1 tablet by mouth daily.   OLANZapine 7.5 MG tablet Commonly known as: ZYPREXA Take 1 tablet (7.5 mg total)  by mouth at bedtime.   ondansetron 8 MG tablet Commonly known as: ZOFRAN TAKE 1 TABLET BY MOUTH EVERY 8 HOURS AS NEEDED FOR NAUSEA FOR VOMITING   pantoprazole 40 MG tablet Commonly known as: PROTONIX Take 1 tablet by mouth twice daily   sertraline 100 MG tablet Commonly known as: ZOLOFT TAKE 1 & 1/2 (ONE & ONE-HALF) TABLETS BY MOUTH ONCE DAILY   sodium bicarbonate 650 MG tablet Take 1 tablet by mouth 4 times daily What changed: See the new instructions.   tamsulosin 0.4 MG Caps capsule Commonly known as: FLOMAX Take 1 capsule by mouth once daily   Zypitamag 2 MG Tabs Generic drug: Pitavastatin Magnesium TAKE 1 TABLET BY MOUTH DAILY.       History (reviewed): Past Medical History:  Diagnosis Date  . Arthritis    lower back  . Bipolar disorder ([place])   . GERD (gastroesophageal reflux disease)   . Hyperlipidemia   . Hypertension   . Substance abuse in remission ([place]) 04/06/2018   AVOID OPIATES!   Past Surgical History:  Procedure Laterality Date  . ANKLE FRACTURE SURGERY Left   . APPENDECTOMY    . CERVICAL FUSION    . CHOLECYSTECTOMY    . COLON SURGERY    . HERNIA REPAIR     Family History  Problem Relation Age of Onset  . Cancer Mother        breast  . Depression Mother   . Hyperlipidemia Mother   . Dementia Mother   . Alcohol abuse Father   . Cancer Father        skin  . Hyperlipidemia Father   . Alzheimer's disease Father   . Cancer Maternal Grandfather   . Cancer Paternal Grandmother    Social History   Socioeconomic History  . Marital status: Married    Spouse name: Dawn  . Number of children: 1  . Years of education: 22  . Highest education level: Not on file  Occupational History  . Occupation: Disabled  Tobacco Use  . Smoking status: Former Research scientist (life sciences)  . Smokeless tobacco: Never Used  Vaping Use  . Vaping Use: Never used  Substance and Sexual Activity  . Alcohol use: No  . Drug use: No    Comment: 04/06/2018 Patient's wife wanted [place] to  be very aware that her husband is a recovering narcotic abuser  . Sexual activity: Yes  Other Topics Concern  . Not on file  Social History Narrative   Lives with his wife. Likes to spend to time with grandchildren. Does not exercise due to back issues in the past and respiratory problems. Daily caffeine use 2-3 drinks a day.    Social Determinants of Health   Financial Resource Strain: Low Risk   . Difficulty of Paying Living Expenses: Not hard at all  Food Insecurity: No Food Insecurity  . Worried About Charity fundraiser in the Last Year: Never true  . Ran Out of Food in the Last Year: Never true  Transportation Needs: No Transportation Needs  .  Lack of Transportation (Medical): No  . Lack of Transportation (Non-Medical): No  Physical Activity: Inactive  . Days of Exercise per Week: 0 days  . Minutes of Exercise per Session: 0 min  Stress: No Stress Concern Present  . Feeling of Stress : Not at all  Social Connections: Socially Integrated  . Frequency of Communication with Friends and Family: Twice a week  . Frequency of Social Gatherings with Friends and Family: Three times a week  . Attends Religious Services: More than 4 times per year  . Active Member of Clubs or Organizations: Yes  . Attends Archivist Meetings: More than 4 times per year  . Marital Status: Married    Activities of Daily Living In your present state of health, do you have any difficulty performing the following activities: 10/18/2020  Hearing? N  Vision? N  Difficulty concentrating or making decisions? N  Walking or climbing stairs? Y  Comment shortness of breath  Dressing or bathing? N  Doing errands, shopping? N  Preparing Food and eating ? N  Using the Toilet? N  In the past six months, have you accidently leaked urine? N  Do you have problems with loss of bowel control? N  Managing your Medications? N  Managing your Finances? N  Housekeeping or managing your Housekeeping? N  Some  recent data might be hidden    Patient Education/ Literacy How often do you need to have someone help you when you read instructions, pamphlets, or other written materials from your doctor or pharmacy?: 1 - Never What is the last grade level you completed in school?: 2 years of college  Exercise Current Exercise Habits: Home exercise routine, Type of exercise: walking, Time (Minutes): 10, Frequency (Times/Week): >7, Weekly Exercise (Minutes/Week): 0, Exercise limited by: respiratory conditions(s)  Diet Patient reports consuming 3 meals a day and 1 snack(s) a day Patient reports that his primary diet is: Regular Patient reports that she does have regular access to food.   Depression Screen PHQ 2/9 Scores 10/18/2020 08/22/2018 02/16/2018 06/09/2017  PHQ - 2 Score 0 1 0 2  PHQ- 9 Score 0 - - 6     Fall Risk Fall Risk  10/18/2020 08/22/2018 02/16/2018  Falls in the past year? 0 0 No  Number falls in past yr: 0 - -  Injury with Fall? 0 - -  Risk for fall due to : No Fall Risks - -  Follow up Falls evaluation completed;Education provided Falls prevention discussed -     Objective:  Jake [name] seemed alert and oriented and he participated appropriately during our telephone visit.  Blood Pressure Weight BMI  BP Readings from Last 3 Encounters:  10/15/20 128/86  09/04/20 (!) 142/98  08/23/20 137/71   Wt Readings from Last 3 Encounters:  10/15/20 250 lb (113.4 kg)  09/04/20 248 lb (112.5 kg)  08/23/20 247 lb (112 kg)   BMI Readings from Last 1 Encounters:  10/15/20 34.87 kg/m    *Unable to obtain current vital signs, weight, and BMI due to telephone visit type  Hearing/Vision  . Alper did not seem to have difficulty with hearing/understanding during the telephone conversation . Reports that he has had a formal eye exam by an eye care professional within the past year . Reports that he has had a formal hearing evaluation within the past year *Unable to fully assess hearing  and vision during telephone visit type  Cognitive Function: 6CIT Screen 10/18/2020 08/22/2018  What Year? 0 points  0 points  What month? 0 points 0 points  What time? 0 points 0 points  Count back from 20 0 points 0 points  Months in reverse 0 points 0 points  Repeat phrase 0 points 2 points  Total Score 0 2   (Normal:0-7, Significant for Dysfunction: >8)  Normal Cognitive Function Screening: Yes   Immunization & Health Maintenance Record Immunization History  Administered Date(s) Administered  . Influenza,inj,Quad PF,6+ Mos 07/16/2014, 07/21/2017, 08/22/2018, 06/13/2019  . Influenza-Unspecified 07/16/2014, 06/14/2016  . PFIZER(Purple Top)SARS-COV-2 Vaccination 11/21/2019, 12/24/2019, 06/23/2020  . Td 04/15/2015  . Tdap 10/10/2009    Health Maintenance  Topic Date Due  . HIV Screening  10/30/2020 (Originally 01/06/1976)  . INFLUENZA VACCINE  12/05/2020 (Originally 04/07/2020)  . COLONOSCOPY (Pts 45-70yrs Insurance coverage will need to be confirmed)  02/20/2021 (Originally 01/05/2006)  . TETANUS/TDAP  04/14/2025  . COVID-19 Vaccine  Completed  . Hepatitis C Screening  Completed       Assessment  This is a routine wellness examination for Jake [name].  Health Maintenance: Due or Overdue There are no preventive care reminders to display for this patient.  Jake [name] does not need a referral for Community Assistance: Care Management:   no Social Work:    no Prescription Assistance:  no Nutrition/Diabetes Education:  no   Plan:  Personalized Goals Goals Addressed              This Visit's Progress   .  Patient Stated (pt-stated)        10/18/2020 AWV Goal: Exercise for General Health   Patient will verbalize understanding of the benefits of increased physical activity:  Exercising regularly is important. It will improve your overall fitness, flexibility, and endurance.  Regular exercise also will improve your overall health. It can help you control  your weight, reduce stress, and improve your bone density.  Over the next year, patient will increase physical activity as tolerated with a goal of at least 150 minutes of moderate physical activity per week.   You can tell that you are exercising at a moderate intensity if your heart starts beating faster and you start breathing faster but can still hold a conversation.  Moderate-intensity exercise ideas include:  Walking 1 mile (1.6 km) in about 15 minutes  Biking  Hiking  Golfing  Dancing  Water aerobics  Patient will verbalize understanding of everyday activities that increase physical activity by providing examples like the following: ? Yard work, such as: ? Pushing a Conservation officer, nature ? Raking and bagging leaves ? Washing your car ? Pushing a stroller ? Shoveling snow ? Gardening ? Washing windows or floors  Patient will be able to explain general safety guidelines for exercising:   Before you start a new exercise program, talk with your health care provider.  Do not exercise so much that you hurt yourself, feel dizzy, or get very short of breath.  Wear comfortable clothes and wear shoes with good support.  Drink plenty of water while you exercise to prevent dehydration or heat stroke.  Work out until your breathing and your heartbeat get faster.       Personalized Health Maintenance & Screening Recommendations  Colorectal cancer screening Shingrix vaccine  Lung Cancer Screening Recommended: no (Low Dose CT Chest recommended if Age 64-80 years, 30 pack-year currently smoking OR have quit w/in past 15 years) Hepatitis C Screening recommended: no HIV Screening recommended: yes  Advanced Directives: Written information was not prepared per patient's request.  Referrals &  Orders No orders of the defined types were placed in this encounter.   Follow-up Plan . Follow-up with Donella Stade, PA-C as planned . Referral for your Cologuard will be sent.  . Please  schedule your shingrix vaccine appointment at the pharmacy.    I have personally reviewed and noted the following in the patient's chart:   . Medical and social history . Use of alcohol, tobacco or illicit drugs  . Current medications and supplements . Functional ability and status . Nutritional status . Physical activity . Advanced directives . List of other physicians . Hospitalizations, surgeries, and ER visits in previous 12 months . Vitals . Screenings to include cognitive, depression, and falls . Referrals and appointments  In addition, I have reviewed and discussed with Jake [name] certain preventive protocols, quality metrics, and best practice recommendations. A written personalized care plan for preventive services as well as general preventive health recommendations is available and can be mailed to the patient at his request.      Tinnie Gens, RN  10/18/2020

## 2020-10-22 ENCOUNTER — Telehealth: Payer: Self-pay | Admitting: Neurology

## 2020-10-22 NOTE — Telephone Encounter (Signed)
Cologuard order faxed to [phone] with confirmation received. They will contact the patient directly.   

## 2020-10-24 DIAGNOSIS — R0602 Shortness of breath: Secondary | ICD-10-CM | POA: Diagnosis not present

## 2020-10-24 DIAGNOSIS — R0902 Hypoxemia: Secondary | ICD-10-CM | POA: Diagnosis not present

## 2020-11-05 ENCOUNTER — Other Ambulatory Visit: Payer: Self-pay | Admitting: Physician Assistant

## 2020-11-05 DIAGNOSIS — M545 Low back pain, unspecified: Secondary | ICD-10-CM

## 2020-11-05 DIAGNOSIS — M62830 Muscle spasm of back: Secondary | ICD-10-CM

## 2020-11-07 ENCOUNTER — Ambulatory Visit (INDEPENDENT_AMBULATORY_CARE_PROVIDER_SITE_OTHER)
Admission: RE | Admit: 2020-11-07 | Discharge: 2020-11-07 | Disposition: A | Discharge status: Home / Self Care | Payer: Medicare HMO | Source: Ambulatory Visit | Attending: Pulmonary Disease | Admitting: Pulmonary Disease

## 2020-11-07 ENCOUNTER — Ambulatory Visit (HOSPITAL_COMMUNITY): Payer: Medicare HMO | Attending: Internal Medicine

## 2020-11-07 ENCOUNTER — Other Ambulatory Visit: Payer: Self-pay

## 2020-11-07 DIAGNOSIS — R0600 Dyspnea, unspecified: Secondary | ICD-10-CM | POA: Diagnosis not present

## 2020-11-07 DIAGNOSIS — J84112 Idiopathic pulmonary fibrosis: Secondary | ICD-10-CM

## 2020-11-07 DIAGNOSIS — R0602 Shortness of breath: Secondary | ICD-10-CM

## 2020-11-12 ENCOUNTER — Ambulatory Visit (HOSPITAL_COMMUNITY): Payer: Medicare HMO | Attending: Cardiovascular Disease

## 2020-11-12 ENCOUNTER — Other Ambulatory Visit: Payer: Self-pay

## 2020-11-12 DIAGNOSIS — R0602 Shortness of breath: Secondary | ICD-10-CM | POA: Diagnosis not present

## 2020-11-12 LAB — ECHOCARDIOGRAM COMPLETE
Area-P 1/2: 3.27 cm2
S' Lateral: 4.3 cm

## 2020-11-12 MED ORDER — PERFLUTREN LIPID MICROSPHERE
2.0000 mL | INTRAVENOUS | Status: AC | PRN
  Administered 2020-11-12: 2 mL via INTRAVENOUS

## 2020-11-13 ENCOUNTER — Ambulatory Visit: Payer: Medicare HMO | Admitting: Pulmonary Disease

## 2020-11-13 ENCOUNTER — Encounter: Payer: Self-pay | Admitting: Pulmonary Disease

## 2020-11-13 VITALS — BP 124/82 | HR 94 | Temp 98.0°F | Ht 71.0 in | Wt 250.0 lb

## 2020-11-13 DIAGNOSIS — J84112 Idiopathic pulmonary fibrosis: Secondary | ICD-10-CM | POA: Diagnosis not present

## 2020-11-13 DIAGNOSIS — I429 Cardiomyopathy, unspecified: Secondary | ICD-10-CM | POA: Diagnosis not present

## 2020-11-13 DIAGNOSIS — R0602 Shortness of breath: Secondary | ICD-10-CM

## 2020-11-13 NOTE — Patient Instructions (Signed)
Referral to cardiology for evaluation of cardiomyopathy-decreased ejection fraction to 30 to 35% from a year ago when it was normal  Graded exercises as tolerated  Keep your oxygen as close to above 88 as possible with activity  We will get a breathing study  Following cardiology evaluation, bronchoscopy can be considered to further ascertain the fluffy changes in the lungs -This may be related to exposure -Can be caused by multiple exposures-molds, dust, organic dust  I will follow-up with you in 4 to 6 weeks  Prescription will be sent into medical supply company for portable concentrator  Call with significant concerns

## 2020-11-13 NOTE — Progress Notes (Signed)
Jake [name]    [id number]    May 28, 1961  Primary Care Physician:Breeback, Royetta Car, PA-C  Referring Physician: Donella Stade, PA-C [address] [place],  [place] [address]  Chief complaint:  Shortness of breath for 1 to 2 years with exercise desaturations  HPI:  Continues to have desaturations with activity Has been trying to get more active since the last visit Walking around in the driveway with oxygen on but will still drop to the 80s on 2 L  No wheezing Albuterol causes tachycardia  Echocardiogram reviewed with the patient showing decreased ejection fraction  CT scan of the chest compared to a year ago does show an interstitial process-hypersensitivity pneumonitis is a differential  Exercise stress test did show some pulmonary limitation  During his last visit Did follow-up with pulmonary at [facility] -PFT with mild restriction -Echo was normal -Stress test had to be done chemically but showed no significant finding  Initially when symptoms started about a year and a half ago he did have an echocardiogram and chemical stress test which were unremarkable  Himself and his spouse stated that about January 2020 they did have what was felt to be a upper respiratory tract infection that they both recovered from and its been since that illness that he has had persistent and progressive symptoms  Quit smoking over 10 years ago, was smoking about 2 packs a day -Breathing study did not show evidence of COPD -Did not see any improvement in his breathing with inhalers, albuterol does help a little bit  Get short of breath with less than 50 yards  He does have a dry cough  Occupational history -Some exposure to asbestos in the building for few years, was not working with asbestos Worked at [employer], office work  History of obstructive sleep apnea that is will body by myself been like that latest he is leaving okay controlled Compliant -On  auto titrating CPAP -CPAP helps with his sleep  No significant musculoskeletal pain contributing to his limitations  He does have oxygen tanks at home, does not use them all the time  Has not been able to exercise regularly as he gets very winded easily  Outpatient Encounter Medications as of 11/13/2020  Medication Sig  . AMBULATORY NON FORMULARY MEDICATION Continuous positive airway pressure (CPAP) machine set at autotitration of H2O pressure, with all supplemental supplies as needed.  [name] Kitchen buPROPion (WELLBUTRIN XL) 150 MG 24 hr tablet Take 1 tablet (150 mg total) by mouth daily.  . colestipol (COLESTID) 1 g tablet Take 1 tablet by mouth once daily  . dicyclomine (BENTYL) 20 MG tablet TAKE 1 TABLET BY MOUTH THREE TIMES DAILY  . ibuprofen (ADVIL) 800 MG tablet TAKE 1 TABLET BY MOUTH EVERY 8 HOURS AS NEEDED  . loratadine (CLARITIN) 10 MG tablet Take 10 mg by mouth daily.  [name] Kitchen losartan (COZAAR) 50 MG tablet Take 1 tablet by mouth once daily  . Multiple Vitamin (MULTIVITAMIN) tablet Take 1 tablet by mouth daily.   [name] Kitchen OLANZapine (ZYPREXA) 7.5 MG tablet Take 1 tablet (7.5 mg total) by mouth at bedtime.  . Omega-3 Fatty Acids (FISH OIL) 1000 MG CAPS Take 2,000 mg by mouth daily.  . ondansetron (ZOFRAN) 8 MG tablet TAKE 1 TABLET BY MOUTH EVERY 8 HOURS AS NEEDED FOR NAUSEA FOR VOMITING  . pantoprazole (PROTONIX) 40 MG tablet Take 1 tablet by mouth twice daily  . sertraline (ZOLOFT) 100 MG tablet  TAKE 1 & 1/2 (ONE & ONE-HALF) TABLETS BY MOUTH ONCE DAILY  . sodium bicarbonate 650 MG tablet Take 1 tablet by mouth 4 times daily (Patient taking differently: Takes it 2-3 times a day)  . tamsulosin (FLOMAX) 0.4 MG CAPS capsule Take 1 capsule by mouth once daily  . ZYPITAMAG 2 MG TABS TAKE 1 TABLET BY MOUTH DAILY.  [name] Kitchen albuterol (VENTOLIN HFA) 108 (90 Base) MCG/ACT inhaler Inhale 2 puffs into the lungs every 6 (six) hours as needed. (Patient not taking: Reported on 11/13/2020)   No facility-administered encounter  medications on file as of 11/13/2020.    Allergies as of 11/13/2020 - Review Complete 11/13/2020  Allergen Reaction Noted  . Fentanyl Other (See Comments) 04/06/2018  . Other  04/06/2018  . Aspirin Other (See Comments) 08/29/2015  . Lipitor [atorvastatin]  11/03/2019  . Prednisone  09/27/2019  . Contrast media [iodinated diagnostic agents] Palpitations 05/04/2016  . Nitroglycerin Nausea And Vomiting 05/04/2016    Past Medical History:  Diagnosis Date  . Arthritis    lower back  . Bipolar disorder ([place])   . GERD (gastroesophageal reflux disease)   . Hyperlipidemia   . Hypertension   . Substance abuse in remission ([place]) 04/06/2018   AVOID OPIATES!    Past Surgical History:  Procedure Laterality Date  . ANKLE FRACTURE SURGERY Left   . APPENDECTOMY    . CERVICAL FUSION    . CHOLECYSTECTOMY    . COLON SURGERY    . HERNIA REPAIR      Family History  Problem Relation Age of Onset  . Cancer Mother        breast  . Depression Mother   . Hyperlipidemia Mother   . Dementia Mother   . Alcohol abuse Father   . Cancer Father        skin  . Hyperlipidemia Father   . Alzheimer's disease Father   . Cancer Maternal Grandfather   . Cancer Paternal Grandmother     Social History   Socioeconomic History  . Marital status: Married    Spouse name: Dawn  . Number of children: 1  . Years of education: 5  . Highest education level: Not on file  Occupational History  . Occupation: Disabled  Tobacco Use  . Smoking status: Former Smoker    Packs/day: 2.00    Years: 25.00    Pack years: 50.00    Quit date: 2010    Years since quitting: 12.1  . Smokeless tobacco: Never Used  Vaping Use  . Vaping Use: Never used  Substance and Sexual Activity  . Alcohol use: No  . Drug use: No    Comment: 04/06/2018 Patient's wife wanted [place] to be very aware that her husband is a recovering narcotic abuser  . Sexual activity: Yes  Other Topics Concern  . Not on file  Social History  Narrative   Lives with his wife. Likes to spend to time with grandchildren. Does not exercise due to back issues in the past and respiratory problems. Daily caffeine use 2-3 drinks a day.    Social Determinants of Health   Financial Resource Strain: Low Risk   . Difficulty of Paying Living Expenses: Not hard at all  Food Insecurity: No Food Insecurity  . Worried About Charity fundraiser in the Last Year: Never true  . Ran Out of Food in the Last Year: Never true  Transportation Needs: No Transportation Needs  . Lack of Transportation (Medical): No  . Lack  of Transportation (Non-Medical): No  Physical Activity: Inactive  . Days of Exercise per Week: 0 days  . Minutes of Exercise per Session: 0 min  Stress: No Stress Concern Present  . Feeling of Stress : Not at all  Social Connections: Socially Integrated  . Frequency of Communication with Friends and Family: Twice a week  . Frequency of Social Gatherings with Friends and Family: Three times a week  . Attends Religious Services: More than 4 times per year  . Active Member of Clubs or Organizations: Yes  . Attends Archivist Meetings: More than 4 times per year  . Marital Status: Married  Human resources officer Violence: Not At Risk  . Fear of Current or Ex-Partner: No  . Emotionally Abused: No  . Physically Abused: No  . Sexually Abused: No    Review of Systems  Constitutional: Positive for fatigue.  Respiratory: Positive for shortness of breath. Negative for chest tightness and wheezing.   Cardiovascular: Negative for chest pain.    Vitals:   11/13/20 1603  BP: 124/82  Pulse: 94  Temp: 98 F (36.7 C)  SpO2: 96%     Physical Exam Constitutional:      Appearance: He is obese.  HENT:     Head: Normocephalic.     Nose: No congestion or rhinorrhea.     Mouth/Throat:     Mouth: Mucous membranes are moist.  Eyes:     Pupils: Pupils are equal, round, and reactive to light.  Cardiovascular:     Rate and Rhythm:  Normal rate and regular rhythm.     Heart sounds: No murmur heard. No friction rub.  Pulmonary:     Effort: No respiratory distress.     Breath sounds: No stridor. No wheezing or rhonchi.  Musculoskeletal:     Cervical back: No rigidity or tenderness.  Neurological:     Mental Status: He is alert.    Data Reviewed:  CT scan of the chest reviewed with the patient-high-resolution CT showing an interstitial process  Echocardiogram showing reduced ejection fraction of 30 to 35% as compared with normal a year ago  Stress test revealed submaximal effort, pulmonary restrictive limitation with desaturations  [facility] -Pulmonary function test interpreted as restrictive  CT scan of the chest did not show any significant abnormality -CT from 11/03/2019 reviewed-mild coronary calcifications -There was mention of some fibrosis  Assessment:  Persistent shortness of breath  Decreased ejection fraction -Cardiology evaluation  Possibility of interstitial lung disease -Hypersensitivity pneumonitis is a consideration  Will refer for cardiology work-up  Bronchoscopy with sampling may be needed to further clarify interstitial process  Plan/Recommendations: Order pulmonary function tests   Graded exercises as tolerated Continue to use oxygen with activities  I will see him back in the office in 4 to 6 weeks   Sherrilyn Rist MD [facility] 11/13/2020, 4:08 PM  CC: Donella Stade, PA-C

## 2020-11-14 ENCOUNTER — Other Ambulatory Visit (HOSPITAL_COMMUNITY): Payer: Medicare HMO

## 2020-11-14 ENCOUNTER — Other Ambulatory Visit: Payer: Self-pay | Admitting: Physician Assistant

## 2020-11-14 DIAGNOSIS — R0600 Dyspnea, unspecified: Secondary | ICD-10-CM

## 2020-11-14 DIAGNOSIS — R931 Abnormal findings on diagnostic imaging of heart and coronary circulation: Secondary | ICD-10-CM | POA: Insufficient documentation

## 2020-11-14 DIAGNOSIS — I429 Cardiomyopathy, unspecified: Secondary | ICD-10-CM | POA: Insufficient documentation

## 2020-11-14 NOTE — Progress Notes (Signed)
Pt called. He lives in [place] and if has to go to hospital would probably go to [facility] or [facility]. He is requesting cardiology in this system. New referral made. Decreased EF from 1 year ago with cardiomegaly.

## 2020-11-15 DIAGNOSIS — I509 Heart failure, unspecified: Secondary | ICD-10-CM | POA: Diagnosis not present

## 2020-11-15 DIAGNOSIS — G4733 Obstructive sleep apnea (adult) (pediatric): Secondary | ICD-10-CM | POA: Diagnosis not present

## 2020-11-15 DIAGNOSIS — I429 Cardiomyopathy, unspecified: Secondary | ICD-10-CM | POA: Diagnosis not present

## 2020-11-15 DIAGNOSIS — I447 Left bundle-branch block, unspecified: Secondary | ICD-10-CM | POA: Diagnosis not present

## 2020-11-15 DIAGNOSIS — I10 Essential (primary) hypertension: Secondary | ICD-10-CM | POA: Diagnosis not present

## 2020-11-15 DIAGNOSIS — J984 Other disorders of lung: Secondary | ICD-10-CM | POA: Diagnosis not present

## 2020-11-15 DIAGNOSIS — R0602 Shortness of breath: Secondary | ICD-10-CM | POA: Diagnosis not present

## 2020-11-21 DIAGNOSIS — I429 Cardiomyopathy, unspecified: Secondary | ICD-10-CM | POA: Diagnosis not present

## 2020-11-21 DIAGNOSIS — R0602 Shortness of breath: Secondary | ICD-10-CM | POA: Diagnosis not present

## 2020-11-21 DIAGNOSIS — I509 Heart failure, unspecified: Secondary | ICD-10-CM | POA: Diagnosis not present

## 2020-11-21 DIAGNOSIS — I10 Essential (primary) hypertension: Secondary | ICD-10-CM | POA: Diagnosis not present

## 2020-11-21 DIAGNOSIS — I447 Left bundle-branch block, unspecified: Secondary | ICD-10-CM | POA: Diagnosis not present

## 2020-11-21 DIAGNOSIS — R0902 Hypoxemia: Secondary | ICD-10-CM | POA: Diagnosis not present

## 2020-11-27 DIAGNOSIS — I5022 Chronic systolic (congestive) heart failure: Secondary | ICD-10-CM | POA: Diagnosis not present

## 2020-11-27 DIAGNOSIS — J984 Other disorders of lung: Secondary | ICD-10-CM | POA: Diagnosis not present

## 2020-11-27 DIAGNOSIS — I110 Hypertensive heart disease with heart failure: Secondary | ICD-10-CM | POA: Diagnosis not present

## 2020-11-27 DIAGNOSIS — I5189 Other ill-defined heart diseases: Secondary | ICD-10-CM | POA: Diagnosis not present

## 2020-11-27 DIAGNOSIS — I428 Other cardiomyopathies: Secondary | ICD-10-CM | POA: Diagnosis not present

## 2020-11-27 DIAGNOSIS — R0602 Shortness of breath: Secondary | ICD-10-CM | POA: Diagnosis not present

## 2020-11-27 DIAGNOSIS — I2510 Atherosclerotic heart disease of native coronary artery without angina pectoris: Secondary | ICD-10-CM | POA: Diagnosis not present

## 2020-11-27 DIAGNOSIS — I081 Rheumatic disorders of both mitral and tricuspid valves: Secondary | ICD-10-CM | POA: Diagnosis not present

## 2020-11-27 DIAGNOSIS — G4733 Obstructive sleep apnea (adult) (pediatric): Secondary | ICD-10-CM | POA: Diagnosis not present

## 2020-11-27 DIAGNOSIS — R931 Abnormal findings on diagnostic imaging of heart and coronary circulation: Secondary | ICD-10-CM | POA: Diagnosis not present

## 2020-11-27 DIAGNOSIS — I447 Left bundle-branch block, unspecified: Secondary | ICD-10-CM | POA: Diagnosis not present

## 2020-11-29 ENCOUNTER — Other Ambulatory Visit: Payer: Self-pay | Admitting: Physician Assistant

## 2020-11-29 DIAGNOSIS — R110 Nausea: Secondary | ICD-10-CM

## 2020-12-09 DIAGNOSIS — I428 Other cardiomyopathies: Secondary | ICD-10-CM | POA: Diagnosis not present

## 2020-12-09 DIAGNOSIS — I447 Left bundle-branch block, unspecified: Secondary | ICD-10-CM | POA: Diagnosis not present

## 2020-12-09 DIAGNOSIS — I5022 Chronic systolic (congestive) heart failure: Secondary | ICD-10-CM | POA: Diagnosis not present

## 2020-12-09 DIAGNOSIS — I10 Essential (primary) hypertension: Secondary | ICD-10-CM | POA: Diagnosis not present

## 2020-12-09 DIAGNOSIS — G4733 Obstructive sleep apnea (adult) (pediatric): Secondary | ICD-10-CM | POA: Diagnosis not present

## 2020-12-20 ENCOUNTER — Other Ambulatory Visit: Payer: Self-pay | Admitting: Physician Assistant

## 2020-12-22 DIAGNOSIS — R0602 Shortness of breath: Secondary | ICD-10-CM | POA: Diagnosis not present

## 2020-12-22 DIAGNOSIS — R0902 Hypoxemia: Secondary | ICD-10-CM | POA: Diagnosis not present

## 2021-01-16 DIAGNOSIS — I428 Other cardiomyopathies: Secondary | ICD-10-CM | POA: Diagnosis not present

## 2021-01-21 DIAGNOSIS — I447 Left bundle-branch block, unspecified: Secondary | ICD-10-CM | POA: Diagnosis not present

## 2021-01-21 DIAGNOSIS — I5022 Chronic systolic (congestive) heart failure: Secondary | ICD-10-CM | POA: Diagnosis not present

## 2021-01-21 DIAGNOSIS — G4733 Obstructive sleep apnea (adult) (pediatric): Secondary | ICD-10-CM | POA: Diagnosis not present

## 2021-01-21 DIAGNOSIS — R0602 Shortness of breath: Secondary | ICD-10-CM | POA: Diagnosis not present

## 2021-01-21 DIAGNOSIS — R0902 Hypoxemia: Secondary | ICD-10-CM | POA: Diagnosis not present

## 2021-01-21 DIAGNOSIS — I10 Essential (primary) hypertension: Secondary | ICD-10-CM | POA: Diagnosis not present

## 2021-01-21 DIAGNOSIS — I429 Cardiomyopathy, unspecified: Secondary | ICD-10-CM | POA: Diagnosis not present

## 2021-01-23 ENCOUNTER — Other Ambulatory Visit: Payer: Self-pay | Admitting: Psychiatry

## 2021-01-23 DIAGNOSIS — F39 Unspecified mood [affective] disorder: Secondary | ICD-10-CM

## 2021-01-23 DIAGNOSIS — F319 Bipolar disorder, unspecified: Secondary | ICD-10-CM

## 2021-01-28 NOTE — Telephone Encounter (Signed)
Schedule apt with Janett Billow for 6 month f/u

## 2021-01-29 DIAGNOSIS — R0902 Hypoxemia: Secondary | ICD-10-CM | POA: Diagnosis not present

## 2021-01-29 DIAGNOSIS — R0602 Shortness of breath: Secondary | ICD-10-CM | POA: Diagnosis not present

## 2021-01-31 NOTE — Telephone Encounter (Signed)
Scheduled f/u 7/13

## 2021-02-03 ENCOUNTER — Other Ambulatory Visit: Payer: Self-pay | Admitting: Psychiatry

## 2021-02-03 DIAGNOSIS — F411 Generalized anxiety disorder: Secondary | ICD-10-CM

## 2021-02-04 DIAGNOSIS — I447 Left bundle-branch block, unspecified: Secondary | ICD-10-CM | POA: Diagnosis not present

## 2021-02-04 DIAGNOSIS — I10 Essential (primary) hypertension: Secondary | ICD-10-CM | POA: Diagnosis not present

## 2021-02-04 DIAGNOSIS — G4733 Obstructive sleep apnea (adult) (pediatric): Secondary | ICD-10-CM | POA: Diagnosis not present

## 2021-02-04 DIAGNOSIS — I429 Cardiomyopathy, unspecified: Secondary | ICD-10-CM | POA: Diagnosis not present

## 2021-02-19 ENCOUNTER — Other Ambulatory Visit: Payer: Self-pay | Admitting: Physician Assistant

## 2021-02-19 ENCOUNTER — Other Ambulatory Visit: Payer: Self-pay | Admitting: Psychiatry

## 2021-02-19 DIAGNOSIS — F39 Unspecified mood [affective] disorder: Secondary | ICD-10-CM

## 2021-02-19 DIAGNOSIS — F319 Bipolar disorder, unspecified: Secondary | ICD-10-CM

## 2021-02-19 DIAGNOSIS — R110 Nausea: Secondary | ICD-10-CM

## 2021-02-21 DIAGNOSIS — R0602 Shortness of breath: Secondary | ICD-10-CM | POA: Diagnosis not present

## 2021-02-21 DIAGNOSIS — R0902 Hypoxemia: Secondary | ICD-10-CM | POA: Diagnosis not present

## 2021-02-25 ENCOUNTER — Other Ambulatory Visit: Payer: Self-pay | Admitting: Physician Assistant

## 2021-02-26 ENCOUNTER — Encounter: Payer: Self-pay | Admitting: Pulmonary Disease

## 2021-02-26 ENCOUNTER — Other Ambulatory Visit: Payer: Self-pay

## 2021-02-26 ENCOUNTER — Ambulatory Visit: Payer: Medicare HMO | Admitting: Pulmonary Disease

## 2021-02-26 ENCOUNTER — Ambulatory Visit (INDEPENDENT_AMBULATORY_CARE_PROVIDER_SITE_OTHER): Payer: Medicare HMO | Admitting: Pulmonary Disease

## 2021-02-26 VITALS — BP 144/90 | HR 65 | Ht 71.0 in | Wt 244.0 lb

## 2021-02-26 DIAGNOSIS — R0602 Shortness of breath: Secondary | ICD-10-CM

## 2021-02-26 DIAGNOSIS — Z9989 Dependence on other enabling machines and devices: Secondary | ICD-10-CM

## 2021-02-26 DIAGNOSIS — J84112 Idiopathic pulmonary fibrosis: Secondary | ICD-10-CM

## 2021-02-26 DIAGNOSIS — G4733 Obstructive sleep apnea (adult) (pediatric): Secondary | ICD-10-CM | POA: Diagnosis not present

## 2021-02-26 LAB — PULMONARY FUNCTION TEST
DL/VA % pred: 101 %
DL/VA: 4.27 ml/min/mmHg/L
DLCO cor % pred: 58 %
DLCO cor: 16.66 ml/min/mmHg
DLCO unc % pred: 58 %
DLCO unc: 16.66 ml/min/mmHg
FEF 25-75 Post: 4.41 L/sec
FEF 25-75 Pre: 3.9 L/sec
FEF2575-%Change-Post: 12 %
FEF2575-%Pred-Post: 143 %
FEF2575-%Pred-Pre: 127 %
FEV1-%Change-Post: -5 %
FEV1-%Pred-Post: 57 %
FEV1-%Pred-Pre: 60 %
FEV1-Post: 2.15 L
FEV1-Pre: 2.27 L
FEV1FVC-%Change-Post: -1 %
FEV1FVC-%Pred-Pre: 122 %
FEV6-%Change-Post: -3 %
FEV6-%Pred-Post: 49 %
FEV6-%Pred-Pre: 51 %
FEV6-Post: 2.33 L
FEV6-Pre: 2.43 L
FEV6FVC-%Change-Post: 0 %
FEV6FVC-%Pred-Post: 103 %
FEV6FVC-%Pred-Pre: 104 %
FVC-%Change-Post: -3 %
FVC-%Pred-Post: 47 %
FVC-%Pred-Pre: 49 %
FVC-Post: 2.35 L
FVC-Pre: 2.44 L
Post FEV1/FVC ratio: 91 %
Post FEV6/FVC ratio: 99 %
Pre FEV1/FVC ratio: 93 %
Pre FEV6/FVC Ratio: 99 %
RV % pred: 66 %
RV: 1.54 L
TLC % pred: 62 %
TLC: 4.52 L

## 2021-02-26 NOTE — Progress Notes (Signed)
Jake [name]    [id number]    Oct 23, 1960  Primary Care Physician:Breeback, Royetta Car, PA-C  Referring Physician: Donella Stade, PA-C [address] [place],  [place] [id number]  Chief complaint:  Shortness of breath  HPI:  He does have exercise desaturations Slight improvement in his symptoms Still requires oxygen supplementation  Some activities and not associated with significant shortness of breath  Denies any wheezing Denies any pain or discomfort Did have tachycardia with albuterol use  Echocardiogram reviewed with the patient showing decreased ejection fraction -Repeat echocardiogram did reveal improvement in his ejection fraction to 40 to 45%  CT scan of the chest compared to a year ago does show an interstitial process-hypersensitivity pneumonitis is a differential  Exercise stress test did show some pulmonary limitation  Himself and his spouse are still concerned about his shortness of breath He says he is feeling a little bit better  During his last visit Did follow-up with pulmonary at [facility] -PFT with mild restriction -Echo was normal -Stress test had to be done chemically but showed no significant finding  Initially when symptoms started about a year and a half ago he did have an echocardiogram and chemical stress test which were unremarkable  Himself and his spouse stated that about January 2020 they did have what was felt to be a upper respiratory tract infection that they both recovered from and its been since that illness that he has had persistent and progressive symptoms  Quit smoking over 10 years ago, was smoking about 2 packs a day -Breathing study did not show evidence of COPD -Did not see any improvement in his breathing with inhalers, albuterol does help a little bit  Get short of breath with less than 50 yards  He does have a dry cough  Occupational history -Some exposure to asbestos in the building for  few years, was not working with asbestos Worked at [employer], office work  History of obstructive sleep apnea that is will body by myself been like that latest he is leaving okay controlled Compliant -On auto titrating CPAP -CPAP helps with his sleep  No significant musculoskeletal pain contributing to his limitations  He does have oxygen tanks at home, does not use them all the time  Has not been able to exercise regularly as he gets very winded easily  Outpatient Encounter Medications as of 02/26/2021  Medication Sig   buPROPion (WELLBUTRIN XL) 150 MG 24 hr tablet Take 1 tablet by mouth once daily   colestipol (COLESTID) 1 g tablet Take 1 tablet by mouth once daily   dicyclomine (BENTYL) 20 MG tablet TAKE 1 TABLET BY MOUTH THREE TIMES DAILY   ibuprofen (ADVIL) 800 MG tablet TAKE 1 TABLET BY MOUTH EVERY 8 HOURS AS NEEDED   loratadine (CLARITIN) 10 MG tablet Take 10 mg by mouth daily.   metoprolol succinate (TOPROL-XL) 25 MG 24 hr tablet Take by mouth.   Multiple Vitamin (MULTIVITAMIN) tablet Take 1 tablet by mouth daily.    OLANZapine (ZYPREXA) 7.5 MG tablet TAKE 1 TABLET BY MOUTH ONCE DAILY AT BEDTIME   Omega-3 Fatty Acids (FISH OIL) 1000 MG CAPS Take 2,000 mg by mouth daily.   ondansetron (ZOFRAN) 8 MG tablet TAKE 1 TABLET BY MOUTH EVERY 8 HOURS AS NEEDED FOR NAUSEA FOR VOMITING   pantoprazole (PROTONIX) 40 MG tablet Take 1 tablet by mouth twice daily   sacubitril-valsartan (ENTRESTO) 49-51 MG Take by mouth.  sertraline (ZOLOFT) 100 MG tablet TAKE 1 & 1/2 (ONE & ONE-HALF) TABLETS BY MOUTH ONCE DAILY   tamsulosin (FLOMAX) 0.4 MG CAPS capsule Take 1 capsule by mouth once daily   ZYPITAMAG 2 MG TABS TAKE 1 TABLET BY MOUTH DAILY.   albuterol (VENTOLIN HFA) 108 (90 Base) MCG/ACT inhaler Inhale 2 puffs into the lungs every 6 (six) hours as needed. (Patient not taking: No sig reported)   AMBULATORY NON FORMULARY MEDICATION Continuous positive airway pressure (CPAP) machine set at  autotitration of H2O pressure, with all supplemental supplies as needed.   losartan (COZAAR) 50 MG tablet Take 1 tablet by mouth once daily (Patient not taking: Reported on 02/26/2021)   sodium bicarbonate 650 MG tablet Take 1 tablet by mouth 4 times daily (Patient not taking: Reported on 02/26/2021)   No facility-administered encounter medications on file as of 02/26/2021.    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    Past Surgical History:  Procedure Laterality Date   ANKLE FRACTURE SURGERY Left    APPENDECTOMY     CERVICAL FUSION     CHOLECYSTECTOMY     COLON SURGERY     HERNIA REPAIR      Family History  Problem Relation Age of Onset   Cancer Mother        breast   Depression Mother    Hyperlipidemia Mother    Dementia Mother    Alcohol abuse Father    Cancer Father        skin   Hyperlipidemia Father    Alzheimer's disease Father    Cancer Maternal Grandfather    Cancer Paternal Grandmother     Social History   Socioeconomic History   Marital status: Married    Spouse name: Dawn   Number of children: 1   Years of education: 77   Highest education level: Not on file  Occupational History   Occupation: Disabled  Tobacco Use   Smoking status: Former    Packs/day: 2.00    Years: 25.00    Pack years: 50.00    Types: Cigarettes    Start date: 1982    Quit date: 2010    Years since quitting: 12.4    Smokeless tobacco: Never  Vaping Use   Vaping Use: Never used  Substance and Sexual Activity   Alcohol use: No   Drug use: No    Comment: 04/06/2018 Patient's wife wanted [place] to be very aware that her husband is a recovering narcotic abuser   Sexual activity: Yes  Other Topics Concern   Not on file  Social History Narrative   Lives with his wife. Likes to spend to time with grandchildren. Does not exercise due to back issues in the past and respiratory problems. Daily caffeine use 2-3 drinks a day.    Social Determinants of Health   Financial Resource Strain: Low Risk    Difficulty of  Paying Living Expenses: Not hard at all  Food Insecurity: No Food Insecurity   Worried About [place] in the Last Year: Never true   Ran Out of Food in the Last Year: Never true  Transportation Needs: No Transportation Needs   Lack of Transportation (Medical): No   Lack of Transportation (Non-Medical): No  Physical Activity: Inactive   Days of Exercise per Week: 0 days   Minutes of Exercise per Session: 0 min  Stress: No Stress Concern Present   Feeling of Stress : Not at all  Social Connections: Socially Integrated   Frequency of Communication with Friends and Family: Twice a week   Frequency of Social Gatherings with Friends and Family: Three times a week   Attends Religious Services: More than 4 times per year   Active Member of Clubs or Organizations: Yes   Attends Music therapist: More than 4 times per year   Marital Status: Married  Human resources officer Violence: Not At Risk   Fear of Current or Ex-Partner: No   Emotionally Abused: No   Physically Abused: No   Sexually Abused: No    Review of Systems  Constitutional:  Positive for fatigue.  Respiratory:  Positive for shortness of breath. Negative for chest tightness and wheezing.   Cardiovascular:  Negative for chest pain.  Psychiatric/Behavioral:  Positive for sleep disturbance.    Vitals:   02/26/21 1339  BP:  (!) 144/90  Pulse: 65  SpO2: 95%     Physical Exam Constitutional:      Appearance: He is obese.  HENT:     Head: Normocephalic.     Nose: No congestion or rhinorrhea.     Mouth/Throat:     Mouth: Mucous membranes are moist.  Eyes:     Pupils: Pupils are equal, round, and reactive to light.  Cardiovascular:     Rate and Rhythm: Normal rate and regular rhythm.     Heart sounds: No murmur heard.   No friction rub.  Pulmonary:     Effort: No respiratory distress.     Breath sounds: No stridor. No wheezing or rhonchi.  Musculoskeletal:     Cervical back: No rigidity or tenderness.  Neurological:     Mental Status: He is alert.  Psychiatric:        Mood and Affect: Mood normal.   Data Reviewed:  CT reviewed with the patient showing an interstitial process  Echocardiogram does show improvement in ejection fraction from 30 to 35% to 40 to 45%   Stress test revealed submaximal effort, pulmonary restrictive limitation with desaturations  Records from [facility] reviewed -Pulmonary function test interpreted as restrictive  CT scan of the chest did not show any significant abnormality -CT from 11/03/2019 reviewed-mild coronary calcifications -There was mention of some fibrosis  Pulmonary function test from 02/26/2021 significant for moderate restriction with decreased diffusing capacity  Assessment:   .  Persistent shortness of breath  .  Cardiomyopathy -Follows up with cardiology  Interstitial lung disease .  Concern for hypersensitive pneumonitis-unclear predisposition .  Symptoms are a little bit better, does not appear to be progressive at present -Did discuss bronchoscopy  -Bronchoscopy yield may be low -Did discuss steroid treatment, side effect profile of steroids discussed -Escalation to wedge biopsy if bronchoscopy is unyielding discussed  Graded exercise encouraged  I will be glad to see him prior to 51-month  appointment    Plan/Recommendations: Regular exercises as tolerated  I will see him in  about 3 months  I did encourage to call with any significant concerns  Wean off oxygen as tolerated  Sherrilyn Rist MD [facility] 02/26/2021, 1:45 PM  CC: Donella Stade, PA-C

## 2021-02-26 NOTE — Progress Notes (Signed)
Full PFT completed today ? ?

## 2021-02-26 NOTE — Patient Instructions (Signed)
Graded exercise as tolerated  Continue CPAP use as tolerated  I will see you in about 3 months  Make yourself a chart so that he can compare and try and beat what you have been able to accomplish so far -It does give you realistic targets that you are trying to improve on   Call with significant concerns

## 2021-03-07 ENCOUNTER — Other Ambulatory Visit: Payer: Self-pay | Admitting: Physician Assistant

## 2021-03-11 ENCOUNTER — Telehealth: Payer: Self-pay | Admitting: Physician Assistant

## 2021-03-11 NOTE — Chronic Care Management (AMB) (Signed)
  Chronic Care Management   Note  03/11/2021 Name: Jake [name] MRN: [id number] DOB: 11-02-1960  [name] is a 60 y.o. year old male who is a primary care patient of Donella Stade, PA-C. I reached out to Cheral Marker by phone today in response to a referral sent by [name] PCP, Donella Stade, PA-C.   [name] was given information about Chronic Care Management services today including:  CCM service includes personalized support from designated clinical staff supervised by his physician, including individualized plan of care and coordination with other care providers 24/7 contact phone numbers for assistance for urgent and routine care needs. Service will only be billed when office clinical staff spend 20 minutes or more in a month to coordinate care. Only one practitioner may furnish and bill the service in a calendar month. The patient may stop CCM services at any time (effective at the end of the month) by phone call to the office staff.   Patient agreed to services and verbal consent obtained.   Follow up plan:   Lauretta Grill [facility] Scheduler

## 2021-03-19 ENCOUNTER — Encounter: Payer: Self-pay | Admitting: Psychiatry

## 2021-03-19 ENCOUNTER — Other Ambulatory Visit: Payer: Self-pay

## 2021-03-19 ENCOUNTER — Ambulatory Visit: Payer: Medicare HMO | Admitting: Psychiatry

## 2021-03-19 DIAGNOSIS — F411 Generalized anxiety disorder: Secondary | ICD-10-CM | POA: Diagnosis not present

## 2021-03-19 DIAGNOSIS — F319 Bipolar disorder, unspecified: Secondary | ICD-10-CM | POA: Diagnosis not present

## 2021-03-19 DIAGNOSIS — R69 Illness, unspecified: Secondary | ICD-10-CM | POA: Diagnosis not present

## 2021-03-19 MED ORDER — OLANZAPINE 5 MG PO TABS
5.0000 mg | ORAL_TABLET | Freq: Every day | ORAL | 1 refills | Status: DC
Start: 2021-03-19 — End: 2021-05-01

## 2021-03-19 MED ORDER — SERTRALINE HCL 100 MG PO TABS: ORAL_TABLET | ORAL | 0 refills | Status: DC

## 2021-03-19 NOTE — Progress Notes (Signed)
   03/19/21 1315  Facial and Oral Movements  Muscles of Facial Expression 0  Lips and Perioral Area 0  Jaw 0  Tongue 0  Extremity Movements  Upper (arms, wrists, hands, fingers) 0  Lower (legs, knees, ankles, toes) 0  Trunk Movements  Neck, shoulders, hips 0  Overall Severity  Severity of abnormal movements (highest score from questions above) 0  Incapacitation due to abnormal movements 0  Patient's awareness of abnormal movements (rate only patient's report) 0  AIMS Total Score  AIMS Total Score 0

## 2021-03-19 NOTE — Progress Notes (Signed)
Ponce Skillman [id number] [birth year] 60 y.o.  Subjective:   Patient ID:  Jake [name] is a 60 y.o. (DOB 1960-10-13) male.  Chief Complaint:  Chief Complaint  Patient presents with   Medication Problem    Possible wt gain and fatigue with Olanzapine    HPI [name] presents to the office today for follow-up of anxiety and depression. He is accompanied by his wife.  He reports that he has been dx'd with CHF and is followed by Cardiology and Pulmonology. He reports that he continues to have fatigue and shortness of breath. He reports that Pulmonologist recommended wt loss. He plans to start [facility] in August.   He reports that he "tries not to dwell on things" and thinks that he would benefit from working or volunteering. He notices some depression and anxiety. Wife notices that he seems to feel guilty about not being able to do more physically. He reports that panic s/s have improved compared to the past. He reports fatigue. He reports, "the desire is there." He reports that he has been sleeping well. Wife estimates that he is sleeping about 10 hours in a 24-hour period. Appetite has been ok. He reports craving sweets and carbs. Concentration has been chronically impaired and can stay on task once he is on task. Denies SI.   They have grandchildren that are 66 yo and 2 yo and he will be exhausted after their visits.    Past Psychiatric Medication Trials: Latuda- most effective and had jaw clinching/thrusting. Olanzapine Risperdal Seroquel Abilify Lithium Carbamazepine Topamax Effexor Sertraline Wellbutrin- not effective  AIMS    Flowsheet Row Office Visit from 03/19/2021 in [facility] Total Score 0      PHQ2-9    [place] Office Visit from 10/18/2020 in [place] from 08/22/2018 in [facility] Office Visit from 02/16/2018 in [place] Visit from 06/09/2017 in [facility]  PHQ-2 Total Score 0 1 0 2  PHQ-9 Total Score 0 -- -- 6        Review of Systems:  Review of Systems  Respiratory:  Positive for cough and shortness of breath.   Cardiovascular:  Negative for leg swelling.  Musculoskeletal:  Negative for gait problem.  Neurological:  Negative for tremors.  Psychiatric/Behavioral:         Please refer to HPI   Medications: I have reviewed the patient's current medications.  Current Outpatient Medications  Medication Sig Dispense Refill   albuterol (VENTOLIN HFA) 108 (90 Base) MCG/ACT inhaler Inhale 2 puffs into the lungs every 6 (six) hours as needed. 18 g 0   AMBULATORY NON FORMULARY MEDICATION Continuous positive airway pressure (CPAP) machine set at autotitration of H2O pressure, with all supplemental supplies as needed. 1 each 0   colestipol (COLESTID) 1 g tablet Take 1 tablet by mouth once daily 90 tablet 0   dicyclomine (BENTYL) 20 MG tablet TAKE 1 TABLET BY MOUTH THREE TIMES DAILY 90 tablet 5   ibuprofen (ADVIL) 800 MG tablet TAKE 1 TABLET BY MOUTH EVERY 8 HOURS AS NEEDED 90 tablet 2   loratadine (CLARITIN) 10 MG tablet Take 10 mg by mouth daily.     metoprolol succinate (TOPROL-XL) 25 MG 24 hr tablet Take 25 mg by mouth in the morning and at bedtime.     Multiple Vitamin (MULTIVITAMIN) tablet Take 1 tablet by  mouth daily.      OLANZapine (ZYPREXA) 5 MG tablet Take 1 tablet (5 mg total) by mouth at bedtime. 30 tablet 1   Omega-3 Fatty Acids (FISH OIL) 1200 MG CAPS Take 2,000 mg by mouth in the morning and at bedtime.     ondansetron (ZOFRAN) 8 MG tablet TAKE 1 TABLET BY MOUTH EVERY 8 HOURS AS NEEDED FOR NAUSEA FOR VOMITING 90 tablet 0   pantoprazole (PROTONIX) 40 MG tablet Take 1 tablet by mouth twice daily 180 tablet 1   sacubitril-valsartan (ENTRESTO) 49-51 MG Take by mouth.     tamsulosin (FLOMAX) 0.4 MG CAPS capsule Take 1 capsule by  mouth once daily 90 capsule 1   ZYPITAMAG 2 MG TABS TAKE 1 TABLET BY MOUTH DAILY. 30 tablet 5   losartan (COZAAR) 50 MG tablet Take 1 tablet by mouth once daily (Patient not taking: No sig reported) 90 tablet 0   sertraline (ZOLOFT) 100 MG tablet TAKE 1 & 1/2 (ONE & ONE-HALF) TABLETS BY MOUTH ONCE DAILY 135 tablet 0   sodium bicarbonate 650 MG tablet Take 1 tablet by mouth 4 times daily (Patient not taking: Reported on 02/26/2021) 360 tablet 0   No current facility-administered medications for this visit.    Medication Side Effects: Fatigue and Other: Increased appetite/wt gain  Allergies:  Allergies  Allergen Reactions   Fentanyl Other (See Comments)    Avoid opiates   Other     04/06/18 Patient is a recovering narcotic abuser and, per wife, is NEVER to receive controlled substances (e.g. Narcotics, sedatives, etc.)   Aspirin Other (See Comments)    Per pt   Lipitor [Atorvastatin]     GI side effects.    Prednisone     Can not tolerate oral but can tolerate shots.    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    Past Medical History, Surgical history, Social history, and Family history were reviewed and updated as appropriate.   Please see review of systems for further details on the patient's review from today.   Objective:   Physical Exam:  Wt 238 lb (108 kg)   BMI 33.19 kg/m   Physical Exam Constitutional:      General: He is not in acute distress. Musculoskeletal:        General: No deformity.  Neurological:     Mental Status: He is alert and oriented to person, place, and time.     Coordination: Coordination normal.  Psychiatric:        Attention and Perception: Attention  and perception normal. He does not perceive auditory or visual hallucinations.        Mood and Affect: Mood is anxious. Mood is not depressed. Affect is not labile, blunt, angry or inappropriate.        Speech: Speech normal.        Behavior: Behavior normal.        Thought Content: Thought content normal. Thought content is not paranoid or delusional. Thought content does not include homicidal or suicidal ideation. Thought content does not include homicidal or suicidal plan.        Cognition and Memory: Cognition and memory normal.  Judgment: Judgment normal.     Comments: Insight intact    Lab Review:     Component Value Date/Time   NA 141 03/20/2020 1044   K 4.9 03/20/2020 1044   CL 103 03/20/2020 1044   CO2 31 03/20/2020 1044   GLUCOSE 97 03/20/2020 1044   BUN 18 03/20/2020 1044   CREATININE 1.30 03/20/2020 1044   CALCIUM 9.6 03/20/2020 1044   PROT 7.1 03/20/2020 1044   ALBUMIN 4.4 11/23/2016 1537   AST 27 03/20/2020 1044   ALT 35 03/20/2020 1044   ALKPHOS 59 11/23/2016 1537   BILITOT 0.5 03/20/2020 1044   GFRNONAA 60 03/20/2020 1044   GFRAA 69 03/20/2020 1044       Component Value Date/Time   WBC 5.4 11/07/2019 1049   RBC 5.21 11/07/2019 1049   HGB 14.4 11/07/2019 1049   HCT 42.9 11/07/2019 1049   PLT 240 11/07/2019 1049   MCV 82.3 11/07/2019 1049   MCH 27.6 11/07/2019 1049   MCHC 33.6 11/07/2019 1049   RDW 14.1 11/07/2019 1049   LYMPHSABS 1,291 11/07/2019 1049   EOSABS 108 11/07/2019 1049   BASOSABS 22 11/07/2019 1049    No results found for: POCLITH, LITHIUM   No results found for: PHENYTOIN, PHENOBARB, VALPROATE, CBMZ   .res Assessment: Plan:   Pt seen for 30 minutes and time spent discussing his concerns about medication side effects and wanting to reduce medication. Reviewed medications and potential side effects. Recommend further dose reduction in Olanzapine to minimize wt gain, food cravings, and possible fatigue. Recommend gradual dose  reduction to determine lowest possible effective dose and minimize risk of rebound insomnia. Discussed that other treatments options may have less side effects if he experiences worsening mood s/s with dose reduction. Will decrease Olanzapine to 5 mg po QHS.  Will discontinue Wellbutrin XL since this has not been effective for target s/s. Continue Sertraline 150 mg po qd for depression and anxiety.  Pt to follow-up in 6 weeks or sooner if clinically indicated.  Patient advised to contact office with any questions, adverse effects, or acute worsening in signs and symptoms.   Quinntin was seen today for medication problem.  Diagnoses and all orders for this visit:  Bipolar affective disorder, remission status unspecified (HCC) -     OLANZapine (ZYPREXA) 5 MG tablet; Take 1 tablet (5 mg total) by mouth at bedtime.  Generalized anxiety disorder -     sertraline (ZOLOFT) 100 MG tablet; TAKE 1 & 1/2 (ONE & ONE-HALF) TABLETS BY MOUTH ONCE DAILY    Please see After Visit Summary for patient specific instructions.  Future Appointments  Date Time Provider [place]  04/21/2021 11:00 AM [facility] PHARMACIST [facility] None  05/01/2021 10:00 AM Thayer Headings, PMHNP CP-CP None    No orders of the defined types were placed in this encounter.   -------------------------------

## 2021-03-23 DIAGNOSIS — R0602 Shortness of breath: Secondary | ICD-10-CM | POA: Diagnosis not present

## 2021-03-23 DIAGNOSIS — R0902 Hypoxemia: Secondary | ICD-10-CM | POA: Diagnosis not present

## 2021-04-11 ENCOUNTER — Telehealth: Payer: Self-pay | Admitting: Pharmacist

## 2021-04-11 ENCOUNTER — Telehealth (INDEPENDENT_AMBULATORY_CARE_PROVIDER_SITE_OTHER): Payer: Medicare HMO | Admitting: Physician Assistant

## 2021-04-11 ENCOUNTER — Encounter: Payer: Self-pay | Admitting: Physician Assistant

## 2021-04-11 VITALS — BP 141/73 | Temp 98.3°F

## 2021-04-11 DIAGNOSIS — Z8719 Personal history of other diseases of the digestive system: Secondary | ICD-10-CM | POA: Diagnosis not present

## 2021-04-11 DIAGNOSIS — K5792 Diverticulitis of intestine, part unspecified, without perforation or abscess without bleeding: Secondary | ICD-10-CM

## 2021-04-11 DIAGNOSIS — R1032 Left lower quadrant pain: Secondary | ICD-10-CM | POA: Diagnosis not present

## 2021-04-11 DIAGNOSIS — K5900 Constipation, unspecified: Secondary | ICD-10-CM | POA: Diagnosis not present

## 2021-04-11 DIAGNOSIS — Z1211 Encounter for screening for malignant neoplasm of colon: Secondary | ICD-10-CM

## 2021-04-11 MED ORDER — METRONIDAZOLE 500 MG PO TABS: 500.0000 mg | ORAL_TABLET | Freq: Three times a day (TID) | ORAL | 0 refills | Status: DC

## 2021-04-11 MED ORDER — CIPROFLOXACIN HCL 500 MG PO TABS: 500.0000 mg | ORAL_TABLET | Freq: Two times a day (BID) | ORAL | 0 refills | Status: DC

## 2021-04-11 NOTE — Chronic Care Management (AMB) (Signed)
Chronic Care Management Pharmacy Assistant   Name: Jake [name]  MRN: [id number] DOB: 1961-03-05  Jake [name] is an 60 y.o. year old male who presents for his initial CCM visit with the clinical pharmacist.  Recent office visits:  04/11/21 Jake Planas PA-C(PCP VIDEO)- Patient was seen for Diverticulitis. Patient was started on Ciprofloxacin 500 MG BID and Metronidazole 500 MG TID. Patient advised to follow up in person prn.  10/18/20 Jake Decamp, [name]- Patient was seen for annual wellness exam an order for colon cancer screening was placed. Patient advised to follow up with PCP as planned.  Recent consult visits:  02/26/21 Jake [name](Pulm)- Patient was seen for OSA on CPAP. Patient advised to follow up with Cardiology. And follow up with Pulmonology in 3 months.  12/09/20 Jake [name](Caridology)-  Patient was seen for cardiomyopathy. Patient started sacubitril-valsartan (ENTRESTO) 24-26 mg TABS per tablet BID. Patient stopped losartan potassium 50 mg tablet. Patient follow up in 2 weeks.  11/15/20 Jake [name](Cardiology)- Patient was seen for Cardiomyopathy. Patient was started on Take Toprol 25 mg BID and Losartan 50 MG BID.  11/13/20 Jake [name](Pulm)- Patient was seen for Cardiomyopathy. Patient had pulmonary function test performed and referrals to DME and Cardiology were placed. Follow up in 4-6 weeks.  Hospital visits:  None in previous 6 months  Medications: Outpatient Encounter Medications as of 04/11/2021  Medication Sig   albuterol (VENTOLIN HFA) 108 (90 Base) MCG/ACT inhaler Inhale 2 puffs into the lungs every 6 (six) hours as needed.   AMBULATORY NON FORMULARY MEDICATION Continuous positive airway pressure (CPAP) machine set at autotitration of H2O pressure, with all supplemental supplies as needed.   ciprofloxacin (CIPRO) 500 MG tablet Take 1 tablet (500 mg total) by mouth 2 (two) times daily for 10 days.   colestipol (COLESTID) 1 g tablet  Take 1 tablet by mouth once daily   dicyclomine (BENTYL) 20 MG tablet TAKE 1 TABLET BY MOUTH THREE TIMES DAILY   ibuprofen (ADVIL) 800 MG tablet TAKE 1 TABLET BY MOUTH EVERY 8 HOURS AS NEEDED   loratadine (CLARITIN) 10 MG tablet Take 10 mg by mouth daily.   losartan (COZAAR) 50 MG tablet Take 1 tablet by mouth once daily   metoprolol succinate (TOPROL-XL) 25 MG 24 hr tablet Take 25 mg by mouth in the morning and at bedtime.   metroNIDAZOLE (FLAGYL) 500 MG tablet Take 1 tablet (500 mg total) by mouth 3 (three) times daily for 10 days.   Multiple Vitamin (MULTIVITAMIN) tablet Take 1 tablet by mouth daily.    OLANZapine (ZYPREXA) 5 MG tablet Take 1 tablet (5 mg total) by mouth at bedtime.   Omega-3 Fatty Acids (FISH OIL) 1200 MG CAPS Take 2,000 mg by mouth in the morning and at bedtime.   ondansetron (ZOFRAN) 8 MG tablet TAKE 1 TABLET BY MOUTH EVERY 8 HOURS AS NEEDED FOR NAUSEA FOR VOMITING   pantoprazole (PROTONIX) 40 MG tablet Take 1 tablet by mouth twice daily   sacubitril-valsartan (ENTRESTO) 49-51 MG Take by mouth.   sertraline (ZOLOFT) 100 MG tablet TAKE 1 & 1/2 (ONE & ONE-HALF) TABLETS BY MOUTH ONCE DAILY   sodium bicarbonate 650 MG tablet Take 1 tablet by mouth 4 times daily   tamsulosin (FLOMAX) 0.4 MG CAPS capsule Take 1 capsule by mouth once daily   ZYPITAMAG 2 MG TABS TAKE 1 TABLET BY MOUTH DAILY.   No facility-administered encounter medications on file as of 04/11/2021.    Current Medication List  colestipol (COLESTID) 1 g tablet last filled 03/28/21 30 DS ibuprofen (ADVIL) 800 MG tablet last filled 03/07/21 30 DS loratadine (CLARITIN) 10 MG tablet losartan (COZAAR) 50 MG tablet last filled 11/15/20 90 DS metoprolol succinate (TOPROL-XL) 25 MG 24 hr tablet Multiple Vitamin (MULTIVITAMIN) tablet last filled 02/02/21 90 DS OLANZapine (ZYPREXA) 5 MG tablet last filled 03/19/21 30 DS Omega-3 Fatty Acids (FISH OIL) 1200 MG CAPS pantoprazole (PROTONIX) 40 MG tablet last filled 03/14/21 90  DS sacubitril-valsartan (ENTRESTO) 49-51 MG last filled 03/31/21 30 DS sertraline (ZOLOFT) 100 MG tablet last filled 02/06/21 90 DS sodium bicarbonate 650 MG tablet last filled 12/25/20 90 DS tamsulosin (FLOMAX) 0.4 MG CAPS capsule last filled 03/07/21 90 DS ZYPITAMAG 2 MG TABS   [place] Pharmacist Assistant [phone]

## 2021-04-11 NOTE — Progress Notes (Signed)
..Virtual Visit via Video Note  I connected with Jake [name] on 04/11/21 at 10:10 AM EDT by a video enabled telemedicine application and verified that I am speaking with the correct person using two identifiers.  Location: Patient: home  Provider: clinic  .[name] KitchenParticipating in visit:  Patient: Bram Provider: [name] PA-C   I discussed the limitations of evaluation and management by telemedicine and the availability of in person appointments. The patient expressed understanding and agreed to proceed.  History of Present Illness: Pt is a 60 yo male with hx of diverticulitis last flare "years ago" and colon resection "years ago". LLQ pain with constipation for 2 days. No melena or hematochezia. No fever, chills, body aches.   .. 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[place]) 05/27/2017   History of pancreatitis 05/27/2017   Diverticulitis 05/27/2017   Elevated fasting glucose 06/10/2017   Low testosterone 06/10/2017   Benign prostatic hyperplasia (BPH) with urinary urgency 06/12/2017   No energy 06/12/2017   Former smoker 07/25/2017   Cough 07/25/2017   Low testosterone in male 07/25/2017   Non-restorative sleep 07/25/2017   Anxiety 10/30/2015   Chronic nausea 10/30/2015   Major depressive disorder 10/30/2015   Migraine 10/30/2015   Neck pain 10/30/2015   Occipital neuralgia 10/30/2015   Substance abuse in remission ([place]) 04/07/2018   Bipolar disorder ([place]) 07/04/2018   Hearing difficulty of right  ear 01/16/2019   IFG (impaired fasting glucose) 03/20/2019   Coronary artery disease due to calcified coronary lesion 11/03/2019   Thoracic aorta atherosclerosis ([place]) 11/03/2019   Aortic atherosclerosis ([place]) 11/03/2019   Hepatic steatosis 11/03/2019   SOB (shortness of breath) on exertion 11/06/2019   Precordial pain 11/06/2019   Essential hypertension 11/06/2019   Obesity (BMI 30-39.9) 11/06/2019   Aneurysm of ascending aorta ([place]) 11/10/2019   Hypoxia 02/21/2020   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/2020   Sinobronchitis 07/10/2020   Decreased cardiac ejection fraction 11/14/2020   Cardiomyopathy ([place]) 11/14/2020   Dyspnea 11/14/2020   Left lower quadrant abdominal pain 04/11/2021   Resolved Ambulatory Problems    Diagnosis Date Noted   Essential hypertension, benign 05/19/2016   Community acquired pneumonia of left lower lobe of lung 11/20/2016   Impacted cerumen of right ear 01/16/2019   Past Medical History:  Diagnosis Date   Arthritis    Hypertension       Observations/Objective: No acute distress Normal breathing Normal mood and appearance  .[name] Kitchen Today's Vitals   04/11/21 1024  BP: (!) 141/73  Temp: 98.3 F (36.8 C)  SpO2: 93%   There is no height or weight on file to calculate BMI.    Assessment and Plan: [name] KitchenMarland KitchenIoan was seen today for abdominal pain.  Diagnoses and all orders for this visit:  Diverticulitis -     ciprofloxacin (CIPRO) 500 MG tablet; Take 1 tablet (500 mg total) by mouth 2 (two) times daily for 10 days. -     metroNIDAZOLE (FLAGYL) 500 MG tablet; Take 1 tablet (500 mg total) by mouth 3 (three) times daily  for 10 days.  Left lower quadrant abdominal pain -     ciprofloxacin (CIPRO) 500 MG tablet; Take 1 tablet (500 mg total) by mouth 2 (two) times daily for 10 days. -     metroNIDAZOLE (FLAGYL) 500 MG tablet; Take 1 tablet (500 mg total) by mouth 3 (three) times daily for 10 days.  Constipation, unspecified constipation type  Patient  has a history of diverticulitis with colon resection.  It has been a few years since his last diverticulitis flare.  This seems pretty consistent.  Discussed the back of Bentyl to see if helps with constipation.  Went ahead and treated for acute diverticulitis flare with ciprofloxacin and metronidazole. Discussed red flag signs and symptoms. Follow up as needed or if symptoms persist. Needs colonoscopy, referral placed.    Follow Up Instructions:    I discussed the assessment and treatment plan with the patient. The patient was provided an opportunity to ask questions and all were answered. The patient agreed with the plan and demonstrated an understanding of the instructions.   The patient was advised to call back or seek an in-person evaluation if the symptoms worsen or if the condition fails to improve as anticipated.     [name], PA-C

## 2021-04-21 ENCOUNTER — Other Ambulatory Visit: Payer: Self-pay | Admitting: Physician Assistant

## 2021-04-21 ENCOUNTER — Ambulatory Visit (INDEPENDENT_AMBULATORY_CARE_PROVIDER_SITE_OTHER): Payer: Medicare HMO | Admitting: Pharmacist

## 2021-04-21 ENCOUNTER — Other Ambulatory Visit: Payer: Self-pay

## 2021-04-21 DIAGNOSIS — E782 Mixed hyperlipidemia: Secondary | ICD-10-CM

## 2021-04-21 DIAGNOSIS — R7303 Prediabetes: Secondary | ICD-10-CM

## 2021-04-21 DIAGNOSIS — I10 Essential (primary) hypertension: Secondary | ICD-10-CM | POA: Diagnosis not present

## 2021-04-21 NOTE — Progress Notes (Signed)
Chronic Care Management Pharmacy Note  04/22/2021 Name:  Jake [name] MRN:  [id number] DOB:  October 24, 1960  Summary: addressed preDM, HTN, HLD  Recommendations/Changes made from today's visit: none  Plan: f/u with pharmacist in 1 yr  Subjective: Jake [name] is an 60 y.o. year old male who is [name] primary patient of Jake Stade, PA-C.  The CCM team was consulted for assistance with disease management and care coordination needs.    Engaged with patient by telephone for initial visit in response to provider referral for pharmacy case management and/or care coordination services.   Consent to Services:  The patient was given information about Chronic Care Management services, agreed to services, and gave verbal consent prior to initiation of services.  Please see initial visit note for detailed documentation.   Patient Care Team: Jake [name] as PCP - General (Family Medicine) Jake Salines, DO as PCP - Cardiology (Cardiology) Jake Chimera, PA-C as Referring Physician (Pulmonary Disease) Jake [name], [facility] as Pharmacist (Pharmacist)  Recent office visits:  04/11/21 Jake Planas PA-C(PCP VIDEO)- Patient was seen for Diverticulitis. Patient was started on Ciprofloxacin 500 MG BID and Metronidazole 500 MG TID. Patient advised to follow up in person prn.   10/18/20 Jake Decamp, MD- Patient was seen for annual wellness exam an order for colon cancer screening was placed. Patient advised to follow up with PCP as planned.   Recent consult visits:  02/26/21 Jake Rist A, MD(Pulm)- Patient was seen for OSA on CPAP. Patient advised to follow up with Cardiology. And follow up with Pulmonology in 3 months.   12/09/20 Jake [name](Cardiology)-  Patient was seen for cardiomyopathy. Patient started sacubitril-valsartan (ENTRESTO) 24-26 mg TABS per tablet BID. Patient stopped losartan potassium 50 mg tablet. Patient follow up in 2 weeks.   11/15/20 Jake Song  MD(Cardiology)- Patient was seen for Cardiomyopathy. Patient was started on Take Toprol 25 mg BID and Losartan 50 MG BID.   11/13/20 Jake Olalere MD(Pulm)- Patient was seen for Cardiomyopathy. Patient had pulmonary function test performed and referrals to DME and Cardiology were placed. Follow up in 4-6 weeks.   Hospital visits:  None in previous 6 months  Objective:  Lab Results  Component Value Date   CREATININE 1.30 03/20/2020   CREATININE 1.15 11/07/2019   CREATININE 1.20 03/17/2019    Lab Results  Component Value Date   HGBA1C 5.6 03/20/2020   Last diabetic Eye exam: No results found for: HMDIABEYEEXA  Last diabetic Foot exam: No results found for: HMDIABFOOTEX      Component Value Date/Time   CHOL 161 03/20/2020 1044   TRIG 146 03/20/2020 1044   HDL 43 03/20/2020 1044   CHOLHDL 3.7 03/20/2020 1044   LDLCALC 93 03/20/2020 1044    Hepatic Function Latest Ref Rng & Units 03/20/2020 11/07/2019 03/17/2019  Total Protein 6.1 - 8.1 g/dL 7.1 7.0 7.0  Albumin 3.6 - 5.1 g/dL - - -  AST 10 - 35 U/L 27 30 33  ALT 9 - 46 U/L 35 51(H) 55(H)  Alk Phosphatase 40 - 115 U/L - - -  Total Bilirubin 0.2 - 1.2 mg/dL 0.5 0.7 0.5    Lab Results  Component Value Date/Time   TSH 0.96 03/17/2019 12:24 PM   TSH 1.32 06/09/2017 10:42 AM    CBC Latest Ref Rng & Units 11/07/2019 03/17/2019 06/09/2017  WBC 3.8 - 10.8 Thousand/uL 5.4 6.2 6.2  Hemoglobin 13.2 - 17.1 g/dL 14.4 14.4 15.5  Hematocrit 38.5 - 50.0 %  42.9 42.9 45.5  Platelets 140 - 400 Thousand/uL 240 242 284    Lab Results  Component Value Date/Time   VD25OH 35 06/09/2017 10:42 AM    Social History   Tobacco Use  Smoking Status Former   Packs/day: 2.00   Years: 25.00   Pack years: 50.00   Types: Cigarettes   Start date: 1982   Quit date: 2010   Years since quitting: 12.6  Smokeless Tobacco Never   BP Readings from Last 3 Encounters:  04/11/21 (!) 141/73  02/26/21 (!) 144/90  11/13/20 124/82   Pulse Readings from  Last 3 Encounters:  02/26/21 65  11/13/20 94  10/15/20 92   Wt Readings from Last 3 Encounters:  02/26/21 244 lb (110.7 kg)  11/13/20 250 lb (113.4 kg)  10/15/20 250 lb (113.4 kg)    Assessment: Review of patient past medical history, allergies, medications, health status, including review of consultants reports, laboratory and other test data, was performed as part of comprehensive evaluation and provision of chronic care management services.   SDOH:  (Social Determinants of Health) assessments and interventions performed:    CCM Care Plan  Allergies  Allergen Reactions   Fentanyl Other (See Comments)    Avoid opiates Other reaction(s): Other Avoid opiates   Other     04/06/18 Patient is [name] recovering narcotic abuser and, per wife, is NEVER to receive controlled substances (e.g. Narcotics, sedatives, etc.) Other reaction(s): Unknown 04/06/18 Patient is [name] recovering narcotic abuser and, per wife, is NEVER to receive controlled substances (e.g. Narcotics, sedatives, etc.)   Aspirin Other (See Comments)    Per pt Other reaction(s): Unknown Per pt   Atorvastatin Nausea And Vomiting    GI side effects.  GI side effects.    Prednisone     Can not tolerate oral but can tolerate shots.  Other reaction(s): Agitation Can not tolerate oral but can tolerate shots.    Contrast Media [Iodinated Diagnostic Agents] Palpitations   Nitroglycerin Nausea And Vomiting    Medications Reviewed Today     Reviewed by Jake [name], [facility] (Pharmacist) on 04/21/21 at 1135  Med List Status: <None>   Medication Order Taking? Sig Documenting Provider Last Dose Status Informant  albuterol (VENTOLIN HFA) 108 (90 Base) MCG/ACT inhaler [id number] Yes Inhale 2 puffs into the lungs every 6 (six) hours as needed. Jake Stade, PA-C Taking Active   AMBULATORY NON FORMULARY MEDICATION [id number] Yes Continuous positive airway pressure (CPAP) machine set at autotitration of H2O pressure, with all  supplemental supplies as needed. Jake Stade, PA-C Taking Active   colestipol (COLESTID) 1 g tablet [id number] Yes Take 1 tablet by mouth once daily Breeback, Jade L, PA-C Taking Active   dicyclomine (BENTYL) 20 MG tablet [id number] Yes TAKE 1 TABLET BY MOUTH THREE TIMES DAILY Jake Stade, PA-C Taking Active            Med Note Rikki Spearing Apr 21, 2021 11:27 AM) Taking BID except during flare/cramps then takes TID  ibuprofen (ADVIL) 800 MG tablet [id number] Yes TAKE 1 TABLET BY MOUTH EVERY 8 HOURS AS NEEDED Breeback, Jade L, PA-C Taking Active   loratadine (CLARITIN) 10 MG tablet [id number] Yes Take 10 mg by mouth daily. [provider] Taking Active   metoprolol succinate (TOPROL-XL) 25 MG 24 hr tablet [id number] Yes Take 50 mg by mouth in the morning and at bedtime. [provider] Taking Active   Multiple Vitamin (MULTIVITAMIN) tablet [id number] Yes  Take 1 tablet by mouth daily.  [provider] Taking Active   OLANZapine (ZYPREXA) 5 MG tablet [id number] Yes Take 1 tablet (5 mg total) by mouth at bedtime. Thayer Headings, PMHNP Taking Active   Omega-3 Fatty Acids (FISH OIL) 1200 MG CAPS [id number] Yes Take 1,200 mg by mouth daily. [provider] Taking Active   ondansetron (ZOFRAN) 8 MG tablet [id number] Yes TAKE 1 TABLET BY MOUTH EVERY 8 HOURS AS NEEDED FOR NAUSEA FOR VOMITING Breeback, Jade L, PA-C Taking Active   pantoprazole (PROTONIX) 40 MG tablet [id number] Yes Take 1 tablet by mouth twice daily Breeback, Jade L, PA-C Taking Active   sacubitril-valsartan (ENTRESTO) 49-51 MG [id number] Yes Take 1 tablet by mouth 2 (two) times daily. [provider] Taking Active   sertraline (ZOLOFT) 100 MG tablet [id number] Yes TAKE 1 & 1/2 (ONE & ONE-HALF) TABLETS BY MOUTH ONCE DAILY Thayer Headings, PMHNP Taking Active   sodium bicarbonate 650 MG tablet [id number] Yes Take 1 tablet by mouth 4 times daily Breeback, Jade L, PA-C Taking Active   tamsulosin  (FLOMAX) 0.4 MG CAPS capsule [id number] Yes Take 1 capsule by mouth once daily Jake Stade, PA-C Taking Active   ZYPITAMAG 2 MG TABS [id number] Yes TAKE 1 TABLET BY MOUTH DAILY. Jake Stade, PA-C Taking Active             Patient Active Problem List   Diagnosis Date Noted   Left lower quadrant abdominal pain 04/11/2021   Decreased cardiac ejection fraction 11/14/2020   Cardiomyopathy ([place]) 11/14/2020   Dyspnea 11/14/2020   Sinobronchitis 07/10/2020   Hypoxia 02/21/2020   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/2020   Aneurysm of ascending aorta ([facility]) 11/10/2019   SOB (shortness of breath) on exertion 11/06/2019   Precordial pain 11/06/2019   Essential hypertension 11/06/2019   Obesity (BMI 30-39.9) 11/06/2019   Coronary artery disease due to calcified coronary lesion 11/03/2019   Thoracic aorta atherosclerosis ([place]) 11/03/2019   Aortic atherosclerosis ([place]) 11/03/2019   Hepatic steatosis 11/03/2019   IFG (impaired fasting glucose) 03/20/2019   Hearing difficulty of right ear 01/16/2019   Bipolar disorder ([place]) 07/04/2018   Substance abuse in remission ([place]) 04/07/2018   Former smoker 07/25/2017   Cough 07/25/2017   Low testosterone in male 07/25/2017   Non-restorative sleep 07/25/2017   Benign prostatic hyperplasia (BPH) with urinary urgency 06/12/2017   No energy 06/12/2017   Elevated fasting glucose 06/10/2017   Low testosterone 06/10/2017   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[place]) 05/27/2017   History of pancreatitis 05/27/2017   Diverticulitis 05/27/2017   Atelectasis of both lungs 11/20/2016   Hyperlipidemia 05/19/2016   Hypertriglyceridemia 05/19/2016   Diverticulosis of colon without hemorrhage 05/19/2016   IBS (irritable bowel syndrome) 05/19/2016   GERD (gastroesophageal reflux disease) 05/19/2016   Allergic rhinitis 05/19/2016   OSA on CPAP 05/19/2016   Generalized anxiety disorder 05/19/2016   Mood changes 05/19/2016   Low back pain 05/05/2016   Muscle spasm of back  05/05/2016   Anxiety 10/30/2015   Chronic nausea 10/30/2015   Major depressive disorder 10/30/2015   Migraine 10/30/2015   Neck pain 10/30/2015   Occipital neuralgia 10/30/2015    Immunization History  Administered Date(s) Administered   Influenza,inj,Quad PF,6+ Mos 07/16/2014, 07/21/2017, 08/22/2018, 06/13/2019   Influenza-Unspecified 07/16/2014, 06/14/2016   PFIZER(Purple Top)SARS-COV-2 Vaccination 11/21/2019, 12/24/2019, 06/23/2020   Td 04/15/2015   Tdap 10/10/2009    Conditions to be addressed/monitored: HTN, HLD, and preDM  Care Plan : Medication Management  Updates made  by Jake [name], RPH since 04/22/2021 12:00 AM     Problem: HTN, HLD, preDM      Long-Range Goal: Disease Progression Prevention   Start Date: 04/21/2021  This Visit's Progress: On track  Priority: High  Note:   Current Barriers:  None at present  Pharmacist Clinical Goal(s):  Over the next 365 days, patient will maintain control of chronic conditions as evidenced by medication fill history, lab values, and vital signs  through collaboration with PharmD and provider.   Interventions: 1:1 collaboration with Jake Stade, PA-C regarding development and update of comprehensive plan of care as evidenced by provider attestation and co-signature Inter-disciplinary care team collaboration (see longitudinal plan of care) Comprehensive medication review performed; medication list updated in electronic medical record  Impaired Fasting Glucose:  Controlled; current treatment:lifestyle modifications only at this time; a1c 5.6  Current glucose readings: not currently checking, appropriate  Denies hypoglycemic/hyperglycemic symptoms  Current meal patterns: breakfast: yogurt and banana; lunch: PB sandwich & coke, or deli meat sandwich, burger, sometimes salad; dinner: grilled chicken, grilled pork chop & baked potato or salads, spaghetti, meat + 2 veggies, avoids red meat d/t stomach upset; snacks: not big  snacker, occasional potato chips; drinks: 2 cokes per day, lots of water, sometimes gatorade   Current exercise: limited by shortness of breath  Counseled on diet choices and a1c  Recommended continue current regimen,  Hypertension:  Uncontrolled; current treatment:metoprolol 858m BID, entresto 49-549mBID;   Current home readings: 144/77,   Denies hypotensive/hypertensive symptoms  Recommended continue current regimen, cardiologist is actively working on dose titrations,  Hyperlipidemia:  Controlled; current treatment:pitavastatin 58m35maily, fish oil 1200m40mily; LDL 93, TG 146  Recommended continue current regimen & ask cardiologist at next visit if they have specific goals for pt cholesterol control  Patient Goals/Self-Care Activities Over the next 365 days, patient will:  take medications as prescribed  Follow Up Plan: Telephone follow up appointment with care management team member scheduled for:  1 year      Medication Assistance: None required.  Patient affirms current coverage meets needs.  [facility] [facility]:  [id number]: [phone]: [id number]: [phone]: [phone] pill box? No - keeps in [name] bag and pulls out one by one twice per day. Sometimes puts into special bag when traveling but otherwise Pt endorses 100% compliance  Follow Up:  Patient agrees to Care Plan and Follow-up.  Plan: Telephone follow up appointment with care management team member scheduled for:  1 year  KeesDarius [name]

## 2021-04-22 ENCOUNTER — Telehealth: Payer: Self-pay | Admitting: Neurology

## 2021-04-22 NOTE — Telephone Encounter (Signed)
Patient left vm asking for [place] to send chart notes to [employer].   Notes/signed form already sent on 04/15/2021 with confirmation received. Patient made aware. Tried to reach out to the company and received a vm. LVM asking them to tell us what they still need. Refaxed notes with another confirmation received.

## 2021-04-22 NOTE — Patient Instructions (Signed)
Visit Information   PATIENT GOALS:   Goals Addressed             This Visit's Progress    Medication Management       Patient Goals/Self-Care Activities Over the next 365 days, patient will:  take medications as prescribed  Follow Up Plan: Telephone follow up appointment with care management team member scheduled for:  1 year         Consent to CCM Services: Jake [name] was given information about Chronic Care Management services including:  CCM service includes personalized support from designated clinical staff supervised by his physician, including individualized plan of care and coordination with other care providers 24/7 contact phone numbers for assistance for urgent and routine care needs. Service will only be billed when office clinical staff spend 20 minutes or more in a month to coordinate care. Only one practitioner may furnish and bill the service in a calendar month. The patient may stop CCM services at any time (effective at the end of the month) by phone call to the office staff. The patient will be responsible for cost sharing (co-pay) of up to 20% of the service fee (after annual deductible is met).  Patient agreed to services and verbal consent obtained.   Patient verbalizes understanding of instructions provided today and agrees to view in [place].   Telephone follow up appointment with care management team member scheduled for: 1 year  Jake [name]   CLINICAL CARE PLAN: Patient Care Plan: Medication Management     Problem Identified: HTN, HLD, preDM      Long-Range Goal: Disease Progression Prevention   Start Date: 04/21/2021  This Visit's Progress: On track  Priority: High  Note:   Current Barriers:  None at present  Pharmacist Clinical Goal(s):  Over the next 365 days, patient will maintain control of chronic conditions as evidenced by medication fill history, lab values, and vital signs  through collaboration with PharmD and provider.    Interventions: 1:1 collaboration with Donella Stade, PA-C regarding development and update of comprehensive plan of care as evidenced by provider attestation and co-signature Inter-disciplinary care team collaboration (see longitudinal plan of care) Comprehensive medication review performed; medication list updated in electronic medical record  Impaired Fasting Glucose:  Controlled; current treatment:lifestyle modifications only at this time; a1c 5.6  Current glucose readings: not currently checking, appropriate  Denies hypoglycemic/hyperglycemic symptoms  Current meal patterns: breakfast: yogurt and banana; lunch: PB sandwich & coke, or deli meat sandwich, burger, sometimes salad; dinner: grilled chicken, grilled pork chop & baked potato or salads, spaghetti, meat + 2 veggies, avoids red meat d/t stomach upset; snacks: not big snacker, occasional potato chips; drinks: 2 cokes per day, lots of water, sometimes gatorade   Current exercise: limited by shortness of breath  Counseled on diet choices and a1c  Recommended continue current regimen,  Hypertension:  Uncontrolled; current treatment:metoprolol 29m BID, entresto 49-533mBID;   Current home readings: 144/77,   Denies hypotensive/hypertensive symptoms  Recommended continue current regimen, cardiologist is actively working on dose titrations,  Hyperlipidemia:  Controlled; current treatment:pitavastatin 25m65maily, fish oil 1200m61mily; LDL 93, TG 146  Recommended continue current regimen & ask cardiologist at next visit if they have specific goals for pt cholesterol control  Patient Goals/Self-Care Activities Over the next 365 days, patient will:  take medications as prescribed  Follow Up Plan: Telephone follow up appointment with care management team member scheduled for:  1 year

## 2021-04-23 DIAGNOSIS — R0902 Hypoxemia: Secondary | ICD-10-CM | POA: Diagnosis not present

## 2021-04-23 DIAGNOSIS — R0602 Shortness of breath: Secondary | ICD-10-CM | POA: Diagnosis not present

## 2021-04-24 DIAGNOSIS — G4733 Obstructive sleep apnea (adult) (pediatric): Secondary | ICD-10-CM | POA: Diagnosis not present

## 2021-04-26 DIAGNOSIS — R0902 Hypoxemia: Secondary | ICD-10-CM | POA: Diagnosis not present

## 2021-04-26 DIAGNOSIS — I10 Essential (primary) hypertension: Secondary | ICD-10-CM | POA: Diagnosis not present

## 2021-04-28 ENCOUNTER — Other Ambulatory Visit: Payer: Self-pay | Admitting: Physician Assistant

## 2021-04-28 DIAGNOSIS — K580 Irritable bowel syndrome with diarrhea: Secondary | ICD-10-CM

## 2021-05-01 ENCOUNTER — Ambulatory Visit: Payer: Medicare HMO | Admitting: Psychiatry

## 2021-05-01 ENCOUNTER — Other Ambulatory Visit: Payer: Self-pay

## 2021-05-01 ENCOUNTER — Encounter: Payer: Self-pay | Admitting: Psychiatry

## 2021-05-01 DIAGNOSIS — F411 Generalized anxiety disorder: Secondary | ICD-10-CM

## 2021-05-01 DIAGNOSIS — F319 Bipolar disorder, unspecified: Secondary | ICD-10-CM | POA: Diagnosis not present

## 2021-05-01 DIAGNOSIS — R69 Illness, unspecified: Secondary | ICD-10-CM | POA: Diagnosis not present

## 2021-05-01 MED ORDER — SERTRALINE HCL 100 MG PO TABS: ORAL_TABLET | ORAL | 0 refills | Status: DC

## 2021-05-01 MED ORDER — OLANZAPINE 2.5 MG PO TABS: ORAL_TABLET | ORAL | 0 refills | Status: DC

## 2021-05-01 NOTE — Progress Notes (Signed)
Jake [name] [id number] Dec 11, 1960 60 y.o.  Subjective:   Patient ID:  Jake [name] is a 60 y.o. (DOB 26-Jul-1961) male.  Chief Complaint:  Chief Complaint  Patient presents with   Other    Low energy and motivation, excessive daytime somnolence   Medication Problem    Possible metabolic side effects with Olanzapine    HPI [name] presents to the office today for follow-up of anxiety and depression. He is accompanied by wife.  He reports anxiety has been "ok." Occasional panic moments- ""but it's manageable." He reports that panic comes up at different times. He reports "some worry... it paralyzes me at times." Wife reports that Victorino will never make plans for leisure activities and projects.  Denies irritability. Motivation is low. Energy and interest are also low. He reports some episodes of depression "but nothing to keep me in bed for days." Concentration is poor. Enjoys family and church. Denies SI.   Wife notices he is "obsessive compulsive." Wife reports that every morning he watches at least 2 episodes of the same TV show. She reports that he has done this in the past. He denies feeling compelled to watch the show. He denies any other rituals or routines. She reports that he will become obsessive about certain things to include foods, shows, etc. 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 Denies intrusive thoughts.   He reports, "I'm not as drugged" since decrease in Olanzapine. He denies any change in appetite or food cravings. He reports some snacking.   Wife noticed when he first decreased Olanzapine he was jerking awake and was disoriented and then this resolved after 2 weeks.   Starts heart strides soon.   He and his wife report that he continues to snore with cPap.   Past Psychiatric Medication Trials: Latuda- most effective and had jaw  clinching/thrusting. Olanzapine Risperdal Seroquel Abilify Lithium Carbamazepine Topamax Effexor Sertraline Wellbutrin- not effective  AIMS    Flowsheet Row Office Visit from 03/19/2021 in [place] Total Score 0      PHQ2-9    [place] Office Visit from 10/18/2020 in [place] from 08/22/2018 in [place] Office Visit from 02/16/2018 in [place] Visit from 06/09/2017 in [place]  PHQ-2 Total Score 0 1 0 2  PHQ-9 Total Score 0 -- -- 6        Review of Systems:  Review of Systems  Constitutional:  Positive for fatigue.  Respiratory:         Shortness of breath on exertion  Musculoskeletal:  Negative for gait problem.  Neurological:  Negative for tremors.  Psychiatric/Behavioral:         Please refer to HPI   Medications: I have reviewed the patient's current medications.  Current Outpatient Medications  Medication Sig Dispense Refill   albuterol (VENTOLIN HFA) 108 (90 Base) MCG/ACT inhaler Inhale 2 puffs into the lungs every 6 (six) hours as needed. 18 g 0   AMBULATORY NON FORMULARY MEDICATION Continuous positive airway pressure (CPAP) machine set at autotitration of H2O pressure, with all supplemental supplies as needed. 1 each 0   colestipol (COLESTID) 1 g tablet Take 1 tablet by mouth once daily 90 tablet 0   dicyclomine (BENTYL) 20 MG tablet TAKE 1 TABLET BY MOUTH THREE TIMES DAILY 270 tablet 0   ibuprofen (  ADVIL) 800 MG tablet TAKE 1 TABLET BY MOUTH EVERY 8 HOURS AS NEEDED 90 tablet 2   loratadine (CLARITIN) 10 MG tablet Take 10 mg by mouth daily.     metoprolol succinate (TOPROL-XL) 25 MG 24 hr tablet Take 50 mg by mouth in the morning and at bedtime.     Multiple Vitamin (MULTIVITAMIN) tablet Take 1 tablet by mouth daily.      Omega-3 Fatty Acids (FISH OIL)  1200 MG CAPS Take 1,200 mg by mouth daily.     ondansetron (ZOFRAN) 8 MG tablet TAKE 1 TABLET BY MOUTH EVERY 8 HOURS AS NEEDED FOR NAUSEA FOR VOMITING 90 tablet 0   pantoprazole (PROTONIX) 40 MG tablet Take 1 tablet by mouth twice daily 180 tablet 1   sacubitril-valsartan (ENTRESTO) 49-51 MG Take 1 tablet by mouth 2 (two) times daily.     tamsulosin (FLOMAX) 0.4 MG CAPS capsule Take 1 capsule by mouth once daily 90 capsule 1   ZYPITAMAG 2 MG TABS TAKE 1 TABLET BY MOUTH DAILY. 30 tablet 5   OLANZapine (ZYPREXA) 2.5 MG tablet Take 1 tab at bedtime for 3 weeks, then take 1/2 tablet for 2 weeks, then stop. 30 tablet 0   sertraline (ZOLOFT) 100 MG tablet TAKE 1 & 1/2 (ONE & ONE-HALF) TABLETS BY MOUTH ONCE DAILY 135 tablet 0   sodium bicarbonate 650 MG tablet Take 1 tablet by mouth 4 times daily 360 tablet 0   No current facility-administered medications for this visit.    Medication Side Effects: Other: ? Increased appetite.   Allergies:  Allergies  Allergen Reactions   Fentanyl Other (See Comments)    Avoid opiates Other reaction(s): Other Avoid opiates   Other     04/06/18 Patient is a recovering narcotic abuser and, per wife, is NEVER to receive controlled substances (e.g. Narcotics, sedatives, etc.) Other reaction(s): Unknown 04/06/18 Patient is a recovering narcotic abuser and, per wife, is NEVER to receive controlled substances (e.g. Narcotics, sedatives, etc.)   Aspirin Other (See Comments)    Per pt Other reaction(s): Unknown Per pt   Atorvastatin Nausea And Vomiting    GI side effects.  GI side effects.    Prednisone     Can not tolerate oral but can tolerate shots.  Other reaction(s): Agitation Can not tolerate oral but can tolerate shots.    Contrast Media [Iodinated Diagnostic Agents] Palpitations   Nitroglycerin Nausea And Vomiting    Past Medical History:  Diagnosis Date   Arthritis    lower back   Bipolar disorder ([place])    GERD (gastroesophageal reflux disease)     Hyperlipidemia    Hypertension    Substance abuse in remission ([place]) 04/06/2018   AVOID OPIATES!    Past Medical History, Surgical history, Social history, and Family history were reviewed and updated as appropriate.   Please see review of systems for further details on the patient's review from today.   Objective:   Physical Exam:  BP 134/76   Pulse 75   Wt 244 lb (110.7 kg)   BMI 34.03 kg/m   Physical Exam Constitutional:      General: He is not in acute distress. Musculoskeletal:        General: No deformity.  Neurological:     Mental Status: He is alert and oriented to person, place, and time.     Coordination: Coordination normal.  Psychiatric:        Attention and Perception: Attention and perception normal. He does not perceive  auditory or visual hallucinations.        Mood and Affect: Mood is anxious. Mood is not depressed. Affect is not labile, blunt, angry or inappropriate.        Speech: Speech normal.        Behavior: Behavior normal.        Thought Content: Thought content normal. Thought content is not paranoid or delusional. Thought content does not include homicidal or suicidal ideation. Thought content does not include homicidal or suicidal plan.        Cognition and Memory: Cognition and memory normal.        Judgment: Judgment normal.     Comments: Insight intact    Lab Review:     Component Value Date/Time   NA 141 03/20/2020 1044   K 4.9 03/20/2020 1044   CL 103 03/20/2020 1044   CO2 31 03/20/2020 1044   GLUCOSE 97 03/20/2020 1044   BUN 18 03/20/2020 1044   CREATININE 1.30 03/20/2020 1044   CALCIUM 9.6 03/20/2020 1044   PROT 7.1 03/20/2020 1044   ALBUMIN 4.4 11/23/2016 1537   AST 27 03/20/2020 1044   ALT 35 03/20/2020 1044   ALKPHOS 59 11/23/2016 1537   BILITOT 0.5 03/20/2020 1044   GFRNONAA 60 03/20/2020 1044   GFRAA 69 03/20/2020 1044       Component Value Date/Time   WBC 5.4 11/07/2019 1049   RBC 5.21 11/07/2019 1049   HGB 14.4  11/07/2019 1049   HCT 42.9 11/07/2019 1049   PLT 240 11/07/2019 1049   MCV 82.3 11/07/2019 1049   MCH 27.6 11/07/2019 1049   MCHC 33.6 11/07/2019 1049   RDW 14.1 11/07/2019 1049   LYMPHSABS 1,291 11/07/2019 1049   EOSABS 108 11/07/2019 1049   BASOSABS 22 11/07/2019 1049    No results found for: POCLITH, LITHIUM   No results found for: PHENYTOIN, PHENOBARB, VALPROATE, CBMZ   .res Assessment: Plan:   Patient seen for 30 minutes and time spent discussing response to discontinuation of Wellbutrin XL and continue dose reduction of olanzapine.  Discussed continuing reduction of olanzapine to minimize risk of metabolic side effects and since benefit has been unclear.  Will reduce olanzapine to 2.5 mg at bedtime for 3 weeks, then olanzapine 2.5 mg 1/2 tablet at bedtime for 2 weeks, then stop. Also discussed that low energy and low motivation may be associated with sleep apnea, particularly in the absence of other depressive signs and symptoms and since patient and his wife report that he continues to snore regularly despite compliance with CPAP.  Discussed potential benefits, risks, and side effects of modafinil and armodafinil.  Discussed that these are controlled medications, however risk of dependence and abuse are extremely low.  Discussed that these medications may help to improve energy and motivation which could be beneficial since patient will be starting heart strides in the near future.  Discussed that this provider would contact patient's cardiologist to ensure that there are no cardiac contraindications to him starting 1 of these medications. Continue sertraline 150 mg daily for mood and anxiety signs and symptoms. Patient to follow-up in approximately 6 weeks or sooner if clinically indicated. Patient advised to contact office with any questions, adverse effects, or acute worsening in signs and symptoms.    Colben was seen today for other and medication problem.  Diagnoses and all  orders for this visit:  Bipolar affective disorder, remission status unspecified ([place]) -     OLANZapine (ZYPREXA) 2.5 MG tablet; Take 1  tab at bedtime for 3 weeks, then take 1/2 tablet for 2 weeks, then stop.  Generalized anxiety disorder -     sertraline (ZOLOFT) 100 MG tablet; TAKE 1 & 1/2 (ONE & ONE-HALF) TABLETS BY MOUTH ONCE DAILY    Please see After Visit Summary for patient specific instructions.  Future Appointments  Date Time Provider [place]  06/13/2021 10:30 AM Thayer Headings, PMHNP [facility] None  04/21/2022 11:00 AM [facility] PHARMACIST [facility] None    No orders of the defined types were placed in this encounter.   -------------------------------

## 2021-05-05 ENCOUNTER — Telehealth: Payer: Self-pay | Admitting: Adult Health

## 2021-05-05 NOTE — Telephone Encounter (Signed)
error 

## 2021-05-08 ENCOUNTER — Other Ambulatory Visit: Payer: Self-pay | Admitting: Physician Assistant

## 2021-05-08 DIAGNOSIS — R110 Nausea: Secondary | ICD-10-CM

## 2021-05-09 ENCOUNTER — Telehealth: Payer: Self-pay | Admitting: Psychiatry

## 2021-05-09 DIAGNOSIS — G4733 Obstructive sleep apnea (adult) (pediatric): Secondary | ICD-10-CM

## 2021-05-09 DIAGNOSIS — Z9989 Dependence on other enabling machines and devices: Secondary | ICD-10-CM

## 2021-05-09 NOTE — Telephone Encounter (Signed)
Pt LM on VM checking status on JC contacting his cardiologist about possible starting  a new med.   (Modafinil or Armodafinil)? Contact Pt @ [phone]. APT 10/7

## 2021-05-09 NOTE — Telephone Encounter (Signed)
Please let them know that I have not received a response yet. The letter was drafted, signed, and faxed yesterday so they may not have had a chance to review it yet.

## 2021-05-13 NOTE — Telephone Encounter (Signed)
Pt was advised.

## 2021-05-14 MED ORDER — MODAFINIL 200 MG PO TABS: ORAL_TABLET | ORAL | 1 refills | Status: DC

## 2021-05-14 NOTE — Telephone Encounter (Signed)
Please let pt and/or wife know that cardiologist responded that Modafinil or Armodafinil would have no direct cardiac side effects since he is on management for HTN and no coronary artery disease. Script sent to [facility] for Modafinil.

## 2021-05-14 NOTE — Addendum Note (Signed)
Addended by: Sharyl Nimrod on: 05/14/2021 12:47 PM   Modules accepted: Orders

## 2021-05-14 NOTE — Telephone Encounter (Signed)
Pt informed

## 2021-05-15 ENCOUNTER — Other Ambulatory Visit: Payer: Self-pay | Admitting: Physician Assistant

## 2021-05-15 DIAGNOSIS — M62830 Muscle spasm of back: Secondary | ICD-10-CM

## 2021-05-15 DIAGNOSIS — M545 Low back pain, unspecified: Secondary | ICD-10-CM

## 2021-05-20 ENCOUNTER — Telehealth: Payer: Self-pay

## 2021-05-20 NOTE — Telephone Encounter (Signed)
Prior Authorization submitted and approved for MODAFINIL 200 MG effective 09/07/2020-09/06/2021 with [employer]are [id number] PA# [id number]

## 2021-05-22 ENCOUNTER — Other Ambulatory Visit: Payer: Self-pay | Admitting: Physician Assistant

## 2021-05-25 DIAGNOSIS — G4733 Obstructive sleep apnea (adult) (pediatric): Secondary | ICD-10-CM | POA: Diagnosis not present

## 2021-05-27 DIAGNOSIS — I10 Essential (primary) hypertension: Secondary | ICD-10-CM | POA: Diagnosis not present

## 2021-05-27 DIAGNOSIS — R0902 Hypoxemia: Secondary | ICD-10-CM | POA: Diagnosis not present

## 2021-05-30 ENCOUNTER — Other Ambulatory Visit: Payer: Self-pay

## 2021-05-30 ENCOUNTER — Ambulatory Visit (INDEPENDENT_AMBULATORY_CARE_PROVIDER_SITE_OTHER): Payer: Medicare HMO

## 2021-05-30 ENCOUNTER — Ambulatory Visit (INDEPENDENT_AMBULATORY_CARE_PROVIDER_SITE_OTHER): Payer: Medicare HMO | Admitting: Physician Assistant

## 2021-05-30 ENCOUNTER — Encounter: Payer: Self-pay | Admitting: Physician Assistant

## 2021-05-30 VITALS — BP 142/67 | HR 71 | Temp 97.7°F | Ht 71.0 in | Wt 253.0 lb

## 2021-05-30 DIAGNOSIS — Z131 Encounter for screening for diabetes mellitus: Secondary | ICD-10-CM

## 2021-05-30 DIAGNOSIS — J40 Bronchitis, not specified as acute or chronic: Secondary | ICD-10-CM

## 2021-05-30 DIAGNOSIS — J329 Chronic sinusitis, unspecified: Secondary | ICD-10-CM

## 2021-05-30 DIAGNOSIS — N401 Enlarged prostate with lower urinary tract symptoms: Secondary | ICD-10-CM | POA: Diagnosis not present

## 2021-05-30 DIAGNOSIS — Z13220 Encounter for screening for lipoid disorders: Secondary | ICD-10-CM

## 2021-05-30 DIAGNOSIS — R3915 Urgency of urination: Secondary | ICD-10-CM | POA: Diagnosis not present

## 2021-05-30 DIAGNOSIS — R0600 Dyspnea, unspecified: Secondary | ICD-10-CM | POA: Diagnosis not present

## 2021-05-30 DIAGNOSIS — I5022 Chronic systolic (congestive) heart failure: Secondary | ICD-10-CM | POA: Diagnosis not present

## 2021-05-30 DIAGNOSIS — R059 Cough, unspecified: Secondary | ICD-10-CM | POA: Diagnosis not present

## 2021-05-30 DIAGNOSIS — J069 Acute upper respiratory infection, unspecified: Secondary | ICD-10-CM

## 2021-05-30 DIAGNOSIS — Z1329 Encounter for screening for other suspected endocrine disorder: Secondary | ICD-10-CM

## 2021-05-30 MED ORDER — FINASTERIDE 1 MG PO TABS
1.0000 mg | ORAL_TABLET | Freq: Every day | ORAL | 3 refills | Status: DC
Start: 2021-05-30 — End: 2021-07-11

## 2021-05-30 MED ORDER — METHYLPREDNISOLONE SODIUM SUCC 125 MG IJ SOLR
125.0000 mg | Freq: Once | INTRAMUSCULAR | Status: AC
  Administered 2021-05-30: 125 mg via INTRAMUSCULAR

## 2021-05-30 MED ORDER — AZITHROMYCIN 250 MG PO TABS: ORAL_TABLET | ORAL | 0 refills | Status: DC

## 2021-05-30 NOTE — Progress Notes (Signed)
Subjective:    Patient ID: Jake [name], male    DOB: Apr 12, 1961, 60 y.o.   MRN: [id number]  Shortness of Breath Associated symptoms include ear pain, headaches, rhinorrhea and a sore throat. Pertinent negatives include no fever or leg swelling.   60y/o male with a PMH of CHF presents to the clinic with cc "Shortness of Breath". Patient states it started 1 week ago. Patient states he has had a productive cough, rhinorrhea with clear mucus, ear pain, headache, sore throat and difficulty breathing due to SOB. Patient states SOB has gotten progressively worse over the past 4 days. Patient states he does need to use his oxygen more, even when he is sitting. Patient denies fever, chills, hemoptysis, peripheral edema.  .. 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[place]) 05/27/2017   History of pancreatitis 05/27/2017   Diverticulitis 05/27/2017   Elevated fasting glucose 06/10/2017   Low testosterone 06/10/2017   Benign prostatic hyperplasia (BPH) with urinary urgency 06/12/2017   No energy 06/12/2017   Former smoker 07/25/2017   Cough 07/25/2017   Low testosterone in male 07/25/2017   Non-restorative sleep 07/25/2017   Anxiety 10/30/2015   Chronic nausea 10/30/2015   Major depressive disorder 10/30/2015   Migraine 10/30/2015   Neck pain 10/30/2015   Occipital neuralgia 10/30/2015   Substance abuse in remission ([place]) 04/07/2018   Bipolar disorder ([place]) 07/04/2018   Hearing difficulty of right ear 01/16/2019   IFG  (impaired fasting glucose) 03/20/2019   Coronary artery disease due to calcified coronary lesion 11/03/2019   Thoracic aorta atherosclerosis ([place]) 11/03/2019   Aortic atherosclerosis ([place]) 11/03/2019   Hepatic steatosis 11/03/2019   SOB (shortness of breath) on exertion 11/06/2019   Precordial pain 11/06/2019   Essential hypertension 11/06/2019   Obesity (BMI 30-39.9) 11/06/2019   Aneurysm of ascending aorta ([place]) 11/10/2019   Hypoxia 02/21/2020   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/2020   Sinobronchitis 07/10/2020   Decreased cardiac ejection fraction 11/14/2020   Cardiomyopathy ([place]) 11/14/2020   Dyspnea 11/14/2020   Left lower quadrant abdominal pain 50/27/7412   Chronic systolic congestive heart failure, NYHA class 3 ([place]) 05/30/2021   Resolved Ambulatory Problems    Diagnosis Date Noted   Essential hypertension, benign 05/19/2016   Community acquired pneumonia of left lower lobe of lung 11/20/2016   Impacted cerumen of right ear 01/16/2019   Past Medical History:  Diagnosis Date   Arthritis    Hypertension      Review of Systems  Constitutional:  Negative for chills and fever.  HENT:  Positive for congestion, ear pain, rhinorrhea and sore throat. Negative for hearing loss.   Respiratory:  Positive for cough and shortness of breath.   Cardiovascular:  Negative for leg swelling.  Neurological:  Positive for headaches.      Objective:   Physical Exam HENT:     Right Ear: Tympanic membrane and ear canal normal.     Left Ear: Tympanic membrane and ear canal normal.     Mouth/Throat:     Pharynx: Oropharynx is  clear. No oropharyngeal exudate or posterior oropharyngeal erythema.  Cardiovascular:     Rate and Rhythm: Normal rate.  Pulmonary:     Breath sounds: Normal breath sounds.      Assessment & Plan:  [name] KitchenMarland KitchenSeneca was seen today for shortness of breath.  Diagnoses and all orders for this visit:  Sinobronchitis -     DG Chest 2 View; Future -     CBC with  Differential/Platelet -     azithromycin (ZITHROMAX) 250 MG tablet; 2 tabs po x1 on Day 1, then 1 tab po daily on Days 2 - 5 -     methylPREDNISolone sodium succinate (SOLU-MEDROL) 125 mg/2 mL injection 125 mg  Chronic systolic congestive heart failure, NYHA class 3 (HCC) -     CBC with Differential/Platelet -     B Nat Peptide  Benign prostatic hyperplasia (BPH) with urinary urgency -     PSA -     finasteride (PROPECIA) 1 MG tablet; Take 1 tablet (1 mg total) by mouth daily.  Screening for diabetes mellitus -     COMPLETE METABOLIC PANEL WITH GFR  Screening for lipid disorders -     Lipid Panel w/reflex Direct LDL  Screening for thyroid disorder -     TSH  Fasting labs ordered for CPE.  PSA ordered. Continue flomax. Added finesteride.   CXR showed bronchitis.  Treated with zpak for sinobronchitis and solumedrol 125mg  IM. Rest and hydrate. No signs on PE and CXR of fluid overload.   [name] KitchenVernetta Honey PA-C, have reviewed and agree with the above documentation in it's entirety.

## 2021-05-30 NOTE — Progress Notes (Deleted)
   Subjective:    Patient ID: Jake [name], male    DOB: [birth year], 60 y.o.   MRN: [id number]  HPI CHF   Review of Systems     Objective:   Physical Exam        Assessment & Plan:

## 2021-05-30 NOTE — Progress Notes (Signed)
No pneumonia. There is  some bronchial thickening to suggest bronchitis.

## 2021-06-02 ENCOUNTER — Other Ambulatory Visit: Payer: Self-pay | Admitting: Physician Assistant

## 2021-06-02 DIAGNOSIS — R0602 Shortness of breath: Secondary | ICD-10-CM

## 2021-06-02 DIAGNOSIS — R0902 Hypoxemia: Secondary | ICD-10-CM

## 2021-06-02 DIAGNOSIS — J984 Other disorders of lung: Secondary | ICD-10-CM

## 2021-06-03 ENCOUNTER — Ambulatory Visit: Payer: Medicare HMO | Admitting: Physician Assistant

## 2021-06-05 DIAGNOSIS — I10 Essential (primary) hypertension: Secondary | ICD-10-CM | POA: Diagnosis not present

## 2021-06-05 DIAGNOSIS — I428 Other cardiomyopathies: Secondary | ICD-10-CM | POA: Diagnosis not present

## 2021-06-05 DIAGNOSIS — I5022 Chronic systolic (congestive) heart failure: Secondary | ICD-10-CM | POA: Diagnosis not present

## 2021-06-05 DIAGNOSIS — I712 Thoracic aortic aneurysm, without rupture: Secondary | ICD-10-CM | POA: Diagnosis not present

## 2021-06-05 DIAGNOSIS — I447 Left bundle-branch block, unspecified: Secondary | ICD-10-CM | POA: Diagnosis not present

## 2021-06-09 DIAGNOSIS — I5022 Chronic systolic (congestive) heart failure: Secondary | ICD-10-CM | POA: Diagnosis not present

## 2021-06-09 DIAGNOSIS — R3915 Urgency of urination: Secondary | ICD-10-CM | POA: Diagnosis not present

## 2021-06-09 DIAGNOSIS — Z131 Encounter for screening for diabetes mellitus: Secondary | ICD-10-CM | POA: Diagnosis not present

## 2021-06-09 DIAGNOSIS — J40 Bronchitis, not specified as acute or chronic: Secondary | ICD-10-CM | POA: Diagnosis not present

## 2021-06-09 DIAGNOSIS — J329 Chronic sinusitis, unspecified: Secondary | ICD-10-CM | POA: Diagnosis not present

## 2021-06-09 DIAGNOSIS — Z1329 Encounter for screening for other suspected endocrine disorder: Secondary | ICD-10-CM | POA: Diagnosis not present

## 2021-06-09 DIAGNOSIS — N401 Enlarged prostate with lower urinary tract symptoms: Secondary | ICD-10-CM | POA: Diagnosis not present

## 2021-06-09 DIAGNOSIS — Z13220 Encounter for screening for lipoid disorders: Secondary | ICD-10-CM | POA: Diagnosis not present

## 2021-06-10 ENCOUNTER — Ambulatory Visit (INDEPENDENT_AMBULATORY_CARE_PROVIDER_SITE_OTHER): Payer: Medicare HMO | Admitting: Physician Assistant

## 2021-06-10 ENCOUNTER — Other Ambulatory Visit: Payer: Self-pay

## 2021-06-10 VITALS — BP 158/69 | HR 70 | Ht 71.0 in | Wt 249.0 lb

## 2021-06-10 DIAGNOSIS — Z0000 Encounter for general adult medical examination without abnormal findings: Secondary | ICD-10-CM

## 2021-06-10 DIAGNOSIS — Z23 Encounter for immunization: Secondary | ICD-10-CM | POA: Diagnosis not present

## 2021-06-10 DIAGNOSIS — I10 Essential (primary) hypertension: Secondary | ICD-10-CM | POA: Diagnosis not present

## 2021-06-10 DIAGNOSIS — J84112 Idiopathic pulmonary fibrosis: Secondary | ICD-10-CM

## 2021-06-10 DIAGNOSIS — I5022 Chronic systolic (congestive) heart failure: Secondary | ICD-10-CM | POA: Diagnosis not present

## 2021-06-10 DIAGNOSIS — K7460 Unspecified cirrhosis of liver: Secondary | ICD-10-CM

## 2021-06-10 DIAGNOSIS — N401 Enlarged prostate with lower urinary tract symptoms: Secondary | ICD-10-CM

## 2021-06-10 DIAGNOSIS — R7303 Prediabetes: Secondary | ICD-10-CM | POA: Diagnosis not present

## 2021-06-10 DIAGNOSIS — Z6834 Body mass index (BMI) 34.0-34.9, adult: Secondary | ICD-10-CM

## 2021-06-10 DIAGNOSIS — E6609 Other obesity due to excess calories: Secondary | ICD-10-CM | POA: Insufficient documentation

## 2021-06-10 DIAGNOSIS — R3915 Urgency of urination: Secondary | ICD-10-CM

## 2021-06-10 LAB — CBC WITH DIFFERENTIAL/PLATELET
Absolute Monocytes: 631 cells/uL (ref 200–950)
Basophils Absolute: 33 cells/uL (ref 0–200)
Basophils Relative: 0.5 %
Eosinophils Absolute: 202 cells/uL (ref 15–500)
Eosinophils Relative: 3.1 %
HCT: 42 % (ref 38.5–50.0)
Hemoglobin: 13.5 g/dL (ref 13.2–17.1)
Lymphs Abs: 1391 cells/uL (ref 850–3900)
MCH: 27.3 pg (ref 27.0–33.0)
MCHC: 32.1 g/dL (ref 32.0–36.0)
MCV: 85 fL (ref 80.0–100.0)
MPV: 9.4 fL (ref 7.5–12.5)
Monocytes Relative: 9.7 %
Neutro Abs: 4245 cells/uL (ref 1500–7800)
Neutrophils Relative %: 65.3 %
Platelets: 238 10*3/uL (ref 140–400)
RBC: 4.94 10*6/uL (ref 4.20–5.80)
RDW: 13.5 % (ref 11.0–15.0)
Total Lymphocyte: 21.4 %
WBC: 6.5 10*3/uL (ref 3.8–10.8)

## 2021-06-10 LAB — LIPID PANEL W/REFLEX DIRECT LDL
Cholesterol: 160 mg/dL (ref <200)
HDL: 40 mg/dL (ref >=40)
LDL Cholesterol (Calc): 92 mg/dL (calc)
Non-HDL Cholesterol (Calc): 120 mg/dL (calc) (ref <130)
Total CHOL/HDL Ratio: 4 (calc) (ref <5.0)
Triglycerides: 184 mg/dL — ABNORMAL HIGH (ref <150)

## 2021-06-10 LAB — BRAIN NATRIURETIC PEPTIDE: Brain Natriuretic Peptide: 17 pg/mL (ref <100)

## 2021-06-10 LAB — COMPLETE METABOLIC PANEL WITH GFR
AG Ratio: 2.1 (calc) (ref 1.0–2.5)
ALT: 21 U/L (ref 9–46)
AST: 15 U/L (ref 10–35)
Albumin: 4.5 g/dL (ref 3.6–5.1)
Alkaline phosphatase (APISO): 53 U/L (ref 35–144)
BUN/Creatinine Ratio: 25 (calc) — ABNORMAL HIGH (ref 6–22)
BUN: 26 mg/dL — ABNORMAL HIGH (ref 7–25)
CO2: 30 mmol/L (ref 20–32)
Calcium: 9.8 mg/dL (ref 8.6–10.3)
Chloride: 104 mmol/L (ref 98–110)
Creat: 1.04 mg/dL (ref 0.70–1.35)
Globulin: 2.1 g/dL (calc) (ref 1.9–3.7)
Glucose, Bld: 105 mg/dL — ABNORMAL HIGH (ref 65–99)
Potassium: 5 mmol/L (ref 3.5–5.3)
Sodium: 140 mmol/L (ref 135–146)
Total Bilirubin: 0.4 mg/dL (ref 0.2–1.2)
Total Protein: 6.6 g/dL (ref 6.1–8.1)
eGFR: 82 mL/min/{1.73_m2} (ref >=60)

## 2021-06-10 LAB — PSA: PSA: 1.73 ng/mL (ref <=4.00)

## 2021-06-10 LAB — POCT GLYCOSYLATED HEMOGLOBIN (HGB A1C): Hemoglobin A1C: 5.9 % — AB (ref 4.0–5.6)

## 2021-06-10 LAB — TSH: TSH: 1.13 mIU/L (ref 0.40–4.50)

## 2021-06-10 MED ORDER — CHLORTHALIDONE 25 MG PO TABS: 12.5000 mg | ORAL_TABLET | Freq: Every day | ORAL | 0 refills | Status: DC

## 2021-06-10 NOTE — Progress Notes (Signed)
Subjective:    Patient ID: Jake [name], male    DOB: 05-23-1961, 60 y.o.   MRN: [id number]  HPI Pt is a 60 yo male with CAD, CHF, IPF, GERD, HTN, OSA, BPH who presents to the clinic for preventative care.   Pt is on flomax but still having urinary frequency. Would like to try something else.   Using CPAP every night.   Continues to have SOB and fatigue. Uses O2 most of the time. Sees pulmonology and cardiology.   .. 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[place]) 05/27/2017   History of pancreatitis 05/27/2017   Diverticulitis 05/27/2017   Elevated fasting glucose 06/10/2017   Low testosterone 06/10/2017   Benign prostatic hyperplasia (BPH) with urinary urgency 06/12/2017   No energy 06/12/2017   Former smoker 07/25/2017   Cough 07/25/2017   Low testosterone in male 07/25/2017   Non-restorative sleep 07/25/2017   Anxiety 10/30/2015   Chronic nausea 10/30/2015   Major depressive disorder 10/30/2015   Migraine 10/30/2015   Neck pain 10/30/2015   Occipital neuralgia 10/30/2015   Substance abuse in remission ([place]) 04/07/2018   Bipolar disorder ([place]) 07/04/2018   Hearing difficulty of right ear 01/16/2019   IFG (impaired fasting glucose) 03/20/2019   Coronary artery disease due to calcified coronary lesion 11/03/2019   Thoracic aorta atherosclerosis ([place]) 11/03/2019   Aortic atherosclerosis ([place]) 11/03/2019   Hepatic steatosis 11/03/2019   SOB (shortness of breath) on exertion 11/06/2019   Precordial  pain 11/06/2019   Essential hypertension 11/06/2019   Obesity (BMI 30-39.9) 11/06/2019   Aneurysm of ascending aorta 11/10/2019   Hypoxia 02/21/2020   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/2020   Sinobronchitis 07/10/2020   Decreased cardiac ejection fraction 11/14/2020   Cardiomyopathy ([place]) 11/14/2020   Dyspnea 11/14/2020   Left lower quadrant abdominal pain 16/09/930   Chronic systolic congestive heart failure, NYHA class 3 ([place]) 05/30/2021   IPF (idiopathic pulmonary fibrosis) ([place]) 06/10/2021   Pre-diabetes 06/10/2021   Class 1 obesity due to excess calories with serious comorbidity and body mass index (BMI) of 34.0 to 34.9 in adult 06/10/2021   Resolved Ambulatory Problems    Diagnosis Date Noted   Essential hypertension, benign 05/19/2016   Community acquired pneumonia of left lower lobe of lung 11/20/2016   Impacted cerumen of right ear 01/16/2019   Past Medical History:  Diagnosis Date   Arthritis    Hypertension        Review of Systems See HPI.     Objective:   Physical Exam Vitals reviewed.  Constitutional:      Appearance: Normal appearance. He is obese.  HENT:     Head: Normocephalic and atraumatic.     Right Ear: Tympanic membrane, ear canal and external ear normal. There is no impacted cerumen.     Left Ear: Tympanic membrane, ear canal and external ear normal. There is no impacted cerumen.     Nose: Nose normal. No congestion.     Mouth/Throat:     Mouth: Mucous membranes are moist.  Eyes:     Extraocular Movements: Extraocular movements intact.     Conjunctiva/sclera: Conjunctivae normal.     Pupils: Pupils are equal, round, and reactive to light.  Neck:     Vascular: No carotid bruit.  Cardiovascular:     Rate and Rhythm: Normal rate and regular rhythm.     Pulses: Normal pulses.     Heart sounds: Murmur heard.  Pulmonary:     Effort: Pulmonary effort is normal.  Abdominal:     General: Bowel sounds are normal.     Palpations: Abdomen is  soft.  Neurological:     General: No focal deficit present.     Mental Status: He is alert and oriented to person, place, and time.  Psychiatric:        Mood and Affect: Mood normal.        Behavior: Behavior normal.          Assessment & Plan:  [name] KitchenMarland KitchenSabastian was seen today for follow-up.  Diagnoses and all orders for this visit:  Routine physical examination  Flu vaccine need -     Flu Vaccine QUAD 54mo+IM (Fluarix, Fluzone & Alfiuria Quad PF)  IPF (idiopathic pulmonary fibrosis) (HCC)  Pre-diabetes -     POCT glycosylated hemoglobin (Hb A1C)  Need for shingles vaccine -     Varicella-zoster vaccine IM  Chronic systolic congestive heart failure, NYHA class 3 (HCC)  Essential hypertension -     chlorthalidone (HYGROTON) 25 MG tablet; Take 0.5 tablets (12.5 mg total) by mouth daily.  Class 1 obesity due to excess calories with serious comorbidity and body mass index (BMI) of 34.0 to 34.9 in adult  Benign prostatic hyperplasia (BPH) with urinary urgency   .[name] KitchenStart a regular exercise program and make sure you are eating a healthy diet Try to eat 4 servings of dairy a day or take a calcium supplement (500mg  twice a day). PHQ/GAD numbers stable and managed by [facility]. He is weaning off medications.  Discussed fasting labs.  Elevated fasting glucose. A1C in pre-diabetes range. Discussed low sugar and carb diet.  ..  [place] Name 06/10/21 0900         During the last Month   Sensation of Bladder not Empty Not at all     Urinate<2 hours after last Less than 1 time in 5     Mult. stop/start when voiding Less than 1 time in 5     Difficult to postpone voiding Less than half the time     Weak urinary stream Less than 1 time in 5     Push/strain to begin urination Not at all     Times per night up to urinate Not at all           OTHER   Total Score 5             Mild symptoms. Stop flomax. Start finasteride.   BP not to goal of under 130/80. Added  chlorthalidone.  Recheck in 1 month. Discussed low salt diet.  Covid vaccine UTD.  Flu and shingrix given today.

## 2021-06-10 NOTE — Patient Instructions (Addendum)
Plenity capsules.  Start Chlorthalidone for BP. Watch salt in diet.   Health Maintenance, Male Adopting a healthy lifestyle and getting preventive care are important in promoting health and wellness. Ask your health care provider about: The right schedule for you to have regular tests and exams. Things you can do on your own to prevent diseases and keep yourself healthy. What should I know about diet, weight, and exercise? Eat a healthy diet  Eat a diet that includes plenty of vegetables, fruits, low-fat dairy products, and lean protein. Do not eat a lot of foods that are high in solid fats, added sugars, or sodium. Maintain a healthy weight Body mass index (BMI) is a measurement that can be used to identify possible weight problems. It estimates body fat based on height and weight. Your health care provider can help determine your BMI and help you achieve or maintain a healthy weight. Get regular exercise Get regular exercise. This is one of the most important things you can do for your health. Most adults should: Exercise for at least 150 minutes each week. The exercise should increase your heart rate and make you sweat (moderate-intensity exercise). Do strengthening exercises at least twice a week. This is in addition to the moderate-intensity exercise. Spend less time sitting. Even light physical activity can be beneficial. Watch cholesterol and blood lipids Have your blood tested for lipids and cholesterol at 60 years of age, then have this test every 5 years. You may need to have your cholesterol levels checked more often if: Your lipid or cholesterol levels are high. You are older than 60 years of age. You are at high risk for heart disease. What should I know about cancer screening? Many types of cancers can be detected early and may often be prevented. Depending on your health history and family history, you may need to have cancer screening at various ages. This may include  screening for: Colorectal cancer. Prostate cancer. Skin cancer. Lung cancer. What should I know about heart disease, diabetes, and high blood pressure? Blood pressure and heart disease High blood pressure causes heart disease and increases the risk of stroke. This is more likely to develop in people who have high blood pressure readings, are of African descent, or are overweight. Talk with your health care provider about your target blood pressure readings. Have your blood pressure checked: Every 3-5 years if you are 14-60 years of age. Every year if you are 3 years old or older. If you are between the ages of 71 and 65 and are a current or former smoker, ask your health care provider if you should have a one-time screening for abdominal aortic aneurysm (AAA). Diabetes Have regular diabetes screenings. This checks your fasting blood sugar level. Have the screening done: Once every three years after age 82 if you are at a normal weight and have a low risk for diabetes. More often and at a younger age if you are overweight or have a high risk for diabetes. What should I know about preventing infection? Hepatitis B If you have a higher risk for hepatitis B, you should be screened for this virus. Talk with your health care provider to find out if you are at risk for hepatitis B infection. Hepatitis C Blood testing is recommended for: Everyone born from [birth year] through 1965. Anyone with known risk factors for hepatitis C. Sexually transmitted infections (STIs) You should be screened each year for STIs, including gonorrhea and chlamydia, if: You are sexually active and  are younger than 60 years of age. You are older than 60 years of age and your health care provider tells you that you are at risk for this type of infection. Your sexual activity has changed since you were last screened, and you are at increased risk for chlamydia or gonorrhea. Ask your health care provider if you are at risk. Ask  your health care provider about whether you are at high risk for HIV. Your health care provider may recommend a prescription medicine to help prevent HIV infection. If you choose to take medicine to prevent HIV, you should first get tested for HIV. You should then be tested every 3 months for as long as you are taking the medicine. Follow these instructions at home: Lifestyle Do not use any products that contain nicotine or tobacco, such as cigarettes, e-cigarettes, and chewing tobacco. If you need help quitting, ask your health care provider. Do not use street drugs. Do not share needles. Ask your health care provider for help if you need support or information about quitting drugs. Alcohol use Do not drink alcohol if your health care provider tells you not to drink. If you drink alcohol: Limit how much you have to 0-2 drinks a day. Be aware of how much alcohol is in your drink. In the [place] one drink equals one 12 oz bottle of beer (355 mL), one 5 oz glass of wine (148 mL), or one 1 oz glass of hard liquor (44 mL). General instructions Schedule regular health, dental, and eye exams. Stay current with your vaccines. Tell your health care provider if: You often feel depressed. You have ever been abused or do not feel safe at home. Summary Adopting a healthy lifestyle and getting preventive care are important in promoting health and wellness. Follow your health care provider's instructions about healthy diet, exercising, and getting tested or screened for diseases. Follow your health care provider's instructions on monitoring your cholesterol and blood pressure. This information is not intended to replace advice given to you by your health care provider. Make sure you discuss any questions you have with your health care provider. Document Revised: 11/01/2020 Document Reviewed: 08/17/2018 Elsevier Patient Education  2022 [employer].

## 2021-06-13 ENCOUNTER — Ambulatory Visit: Payer: Medicare HMO | Admitting: Psychiatry

## 2021-06-15 DIAGNOSIS — K7460 Unspecified cirrhosis of liver: Secondary | ICD-10-CM | POA: Insufficient documentation

## 2021-06-24 DIAGNOSIS — G4733 Obstructive sleep apnea (adult) (pediatric): Secondary | ICD-10-CM | POA: Diagnosis not present

## 2021-06-26 ENCOUNTER — Other Ambulatory Visit: Payer: Self-pay | Admitting: Physician Assistant

## 2021-06-26 DIAGNOSIS — R0902 Hypoxemia: Secondary | ICD-10-CM | POA: Diagnosis not present

## 2021-06-26 DIAGNOSIS — I10 Essential (primary) hypertension: Secondary | ICD-10-CM | POA: Diagnosis not present

## 2021-07-08 ENCOUNTER — Ambulatory Visit (INDEPENDENT_AMBULATORY_CARE_PROVIDER_SITE_OTHER): Payer: Medicare HMO | Admitting: Physician Assistant

## 2021-07-08 ENCOUNTER — Other Ambulatory Visit: Payer: Self-pay

## 2021-07-08 ENCOUNTER — Encounter: Payer: Self-pay | Admitting: Physician Assistant

## 2021-07-08 VITALS — BP 123/75 | HR 79 | Ht 71.0 in | Wt 244.0 lb

## 2021-07-08 DIAGNOSIS — N401 Enlarged prostate with lower urinary tract symptoms: Secondary | ICD-10-CM | POA: Diagnosis not present

## 2021-07-08 DIAGNOSIS — I5022 Chronic systolic (congestive) heart failure: Secondary | ICD-10-CM | POA: Diagnosis not present

## 2021-07-08 DIAGNOSIS — I10 Essential (primary) hypertension: Secondary | ICD-10-CM | POA: Diagnosis not present

## 2021-07-08 DIAGNOSIS — J302 Other seasonal allergic rhinitis: Secondary | ICD-10-CM

## 2021-07-08 DIAGNOSIS — H6983 Other specified disorders of Eustachian tube, bilateral: Secondary | ICD-10-CM

## 2021-07-08 DIAGNOSIS — R3915 Urgency of urination: Secondary | ICD-10-CM | POA: Diagnosis not present

## 2021-07-08 MED ORDER — AZELASTINE HCL 0.1 % NA SOLN: 2.0000 | Freq: Two times a day (BID) | NASAL | 0 refills | Status: DC

## 2021-07-08 MED ORDER — METHYLPREDNISOLONE SODIUM SUCC 125 MG IJ SOLR
125.0000 mg | Freq: Once | INTRAMUSCULAR | Status: AC
  Administered 2021-07-08: 125 mg via INTRAMUSCULAR

## 2021-07-08 MED ORDER — FINASTERIDE 5 MG PO TABS: 5.0000 mg | ORAL_TABLET | Freq: Every day | ORAL | 0 refills | Status: DC

## 2021-07-08 NOTE — Progress Notes (Signed)
Subjective:    Patient ID: Jake [name], male    DOB: 1960-12-23, 60 y.o.   MRN: [id number]  HPI Patient is a 60 year old obese male with CHF, hypertension, bipolar, OSA, BPH who presents to the clinic for 1 month follow-up.  Overall he does feel better.  He has been walking more and using his oxygen less.  Chlorthalidone was started for his blood pressure and he is tolerating well.  He denies any edema, chest pain, pressure.  He did have to start losartan because his Jake [name] was not covered and he was in the donut hole.  He has been tolerating well.  He is not checking his blood pressures at home.  He does feel like the finasteride is helping better than the Flomax.  He still not 100%.  He is still having some urgency and frequency.  He wonders if he can increase.  He has having some bilateral ear popping and pressure.  If he takes Sudafed it helps.  He denies any ear pain, sore throat, sinus pressure, headache.  He has ongoing seasonal allergies.  He has been using Flonase.   .. 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[place]) 05/27/2017   History of pancreatitis 05/27/2017   Diverticulitis 05/27/2017   Elevated fasting glucose 06/10/2017   Low testosterone 06/10/2017   Benign prostatic hyperplasia (BPH) with urinary urgency 06/12/2017   No energy 06/12/2017   Former smoker 07/25/2017   Cough 07/25/2017   Low testosterone in male 07/25/2017   Non-restorative sleep 07/25/2017   Anxiety 10/30/2015   Chronic  nausea 10/30/2015   Major depressive disorder 10/30/2015   Migraine 10/30/2015   Neck pain 10/30/2015   Occipital neuralgia 10/30/2015   Substance abuse in remission ([place]) 04/07/2018   Bipolar disorder ([place]) 07/04/2018   Hearing difficulty of right ear 01/16/2019   IFG (impaired fasting glucose) 03/20/2019   Coronary artery disease due to calcified coronary lesion 11/03/2019   Thoracic aorta atherosclerosis ([place]) 11/03/2019   Aortic atherosclerosis ([place]) 11/03/2019   Hepatic steatosis 11/03/2019   SOB (shortness of breath) on exertion 11/06/2019   Precordial pain 11/06/2019   Essential hypertension 11/06/2019   Obesity (BMI 30-39.9) 11/06/2019   Aneurysm of ascending aorta 11/10/2019   Hypoxia 02/21/2020   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/2020   Sinobronchitis 07/10/2020   Decreased cardiac ejection fraction 11/14/2020   Cardiomyopathy ([place]) 11/14/2020   Dyspnea 11/14/2020   Left lower quadrant abdominal pain 73/71/0626   Chronic systolic congestive heart failure, NYHA class 3 ([place]) 05/30/2021   IPF (idiopathic pulmonary fibrosis) ([place]) 06/10/2021   Pre-diabetes 06/10/2021   Class 1 obesity due to excess calories with serious comorbidity and body mass index (BMI) of 34.0 to 34.9 in adult 06/10/2021   Cirrhosis of liver without ascites ([place]) 06/15/2021   Dysfunction of both eustachian tubes 07/08/2021   Resolved Ambulatory Problems    Diagnosis Date Noted   Essential hypertension, benign 05/19/2016   Community acquired pneumonia of left lower lobe of lung 11/20/2016   Impacted cerumen of right ear 01/16/2019  Past Medical History:  Diagnosis Date   Arthritis    Hypertension       Review of Systems See HPI.     Objective:   Physical Exam Vitals reviewed.  Constitutional:      Appearance: Normal appearance. He is obese.  HENT:     Head: Normocephalic.     Right Ear: Tympanic membrane, ear canal and external ear normal. There is no impacted cerumen.     Left Ear:  Ear canal and external ear normal. There is no impacted cerumen.     Ears:     Comments: Trace effusion in left ear.     Nose: Congestion present.     Mouth/Throat:     Mouth: Mucous membranes are moist.     Pharynx: No oropharyngeal exudate or posterior oropharyngeal erythema.  Eyes:     Extraocular Movements: Extraocular movements intact.     Conjunctiva/sclera: Conjunctivae normal.     Pupils: Pupils are equal, round, and reactive to light.  Cardiovascular:     Rate and Rhythm: Normal rate and regular rhythm.     Pulses: Normal pulses.     Heart sounds: Murmur heard.  Pulmonary:     Effort: Pulmonary effort is normal.     Breath sounds: Normal breath sounds.  Musculoskeletal:     Right lower leg: No edema.     Left lower leg: No edema.  Neurological:     General: No focal deficit present.     Mental Status: He is alert and oriented to person, place, and time.  Psychiatric:        Mood and Affect: Mood normal.          Assessment & Plan:  Jake [name] KitchenMarland KitchenZeddie was seen today for follow-up.  Diagnoses and all orders for this visit:  Dysfunction of both eustachian tubes -     methylPREDNISolone sodium succinate (SOLU-MEDROL) 125 mg/2 mL injection 125 mg  Essential hypertension  Chronic systolic congestive heart failure, NYHA class 3 (HCC)  Benign prostatic hyperplasia (BPH) with urinary urgency -     finasteride (PROSCAR) 5 MG tablet; Take 1 tablet (5 mg total) by mouth daily.  Seasonal allergic rhinitis, unspecified trigger -     azelastine (ASTELIN) 0.1 % nasal spray; Place 2 sprays into both nostrils 2 (two) times daily. Use in each nostril as directed  Reassured no signs of infection.  On flonase.  Added astelin nasal spray.  Solumedrol given today.  Use sudafed as needed but for no more than 3-5 days at a time for recurrence.   BP looks great.  In donut hole will do losartan until first of year and can get entresto.  Continue chlorthalidone.   Finasteride doing  well but not to goal. Increased to 5mg .   Follow up in 6 months.

## 2021-07-08 NOTE — Patient Instructions (Addendum)
Increase finasteride to 5mg  daily.  Continue same BP medication.  Start azestaline nasal spray.  Eustachian Tube Dysfunction Eustachian tube dysfunction refers to a condition in which a blockage develops in the narrow passage that connects the middle ear to the back of the nose (eustachian tube). The eustachian tube regulates air pressure in the middle ear by letting air move between the ear and nose. It also helps to drain fluid from the middle ear space. Eustachian tube dysfunction can affect one or both ears. When the eustachian tube does not function properly, air pressure, fluid, or both can build up in the middle ear. What are the causes? This condition occurs when the eustachian tube becomes blocked or cannot open normally. Common causes of this condition include: Ear infections. Colds and other infections that affect the nose, mouth, and throat (upper respiratory tract). Allergies. Irritation from cigarette smoke. Irritation from stomach acid coming up into the esophagus (gastroesophageal reflux). The esophagus is the part of the body that moves food from the mouth to the stomach. Sudden changes in air pressure, such as from descending in an airplane or scuba diving. Abnormal growths in the nose or throat, such as: Growths that line the nose (nasal polyps). Abnormal growth of cells (tumors). Enlarged tissue at the back of the throat (adenoids). What increases the risk? You are more likely to develop this condition if: You smoke. You are overweight. You are a child who has: Certain birth defects of the mouth, such as cleft palate. Large tonsils or adenoids. What are the signs or symptoms? Common symptoms of this condition include: A feeling of fullness in the ear. Ear pain. Clicking or popping noises in the ear. Ringing in the ear (tinnitus). Hearing loss. Loss of balance. Dizziness. Symptoms may get worse when the air pressure around you changes, such as when you travel to  an area of high elevation, fly on an airplane, or go scuba diving. How is this diagnosed? This condition may be diagnosed based on: Your symptoms. A physical exam of your ears, nose, and throat. Tests, such as those that measure: The movement of your eardrum. Your hearing (audiometry). How is this treated? Treatment depends on the cause and severity of your condition. In mild cases, you may relieve your symptoms by moving air into your ears. This is called "popping the ears." In more severe cases, or if you have symptoms of fluid in your ears, treatment may include: Medicines to relieve congestion (decongestants). Medicines that treat allergies (antihistamines). Nasal sprays or ear drops that contain medicines that reduce swelling (steroids). A procedure to drain the fluid in your eardrum. In this procedure, a small tube may be placed in the eardrum to: Drain the fluid. Restore the air in the middle ear space. A procedure to insert a balloon device through the nose to inflate the opening of the eustachian tube (balloon dilation). Follow these instructions at home: Lifestyle Do not do any of the following until your health care provider approves: Travel to high altitudes. Fly in airplanes. Work in a Pension scheme manager or room. Scuba dive. Do not use any products that contain nicotine or tobacco. These products include cigarettes, chewing tobacco, and vaping devices, such as e-cigarettes. If you need help quitting, ask your health care provider. Keep your ears dry. Wear fitted earplugs during showering and bathing. Dry your ears completely after. General instructions Take over-the-counter and prescription medicines only as told by your health care provider. Use techniques to help pop your ears as  recommended by your health care provider. These may include: Chewing gum. Yawning. Frequent, forceful swallowing. Closing your mouth, holding your nose closed, and gently blowing as if you are  trying to blow air out of your nose. Keep all follow-up visits. This is important. Contact a health care provider if: Your symptoms do not go away after treatment. Your symptoms come back after treatment. You are unable to pop your ears. You have: A fever. Pain in your ear. Pain in your head or neck. Fluid draining from your ear. Your hearing suddenly changes. You become very dizzy. You lose your balance. Get help right away if: You have a sudden, severe increase in any of your symptoms. Summary Eustachian tube dysfunction refers to a condition in which a blockage develops in the eustachian tube. It can be caused by ear infections, allergies, inhaled irritants, or abnormal growths in the nose or throat. Symptoms may include ear pain or fullness, hearing loss, or ringing in the ears. Mild cases are treated with techniques to unblock the ears, such as yawning or chewing gum. More severe cases are treated with medicines or procedures. This information is not intended to replace advice given to you by your health care provider. Make sure you discuss any questions you have with your health care provider. Document Revised: 11/04/2020 Document Reviewed: 11/04/2020 Elsevier Patient Education  2022 [employer].

## 2021-07-11 ENCOUNTER — Encounter: Payer: Self-pay | Admitting: Physician Assistant

## 2021-07-15 ENCOUNTER — Ambulatory Visit: Payer: Medicare HMO | Admitting: Psychiatry

## 2021-07-21 ENCOUNTER — Other Ambulatory Visit: Payer: Self-pay | Admitting: Physician Assistant

## 2021-07-21 DIAGNOSIS — R110 Nausea: Secondary | ICD-10-CM

## 2021-07-21 DIAGNOSIS — M545 Low back pain, unspecified: Secondary | ICD-10-CM

## 2021-07-21 DIAGNOSIS — M62830 Muscle spasm of back: Secondary | ICD-10-CM

## 2021-07-21 DIAGNOSIS — I10 Essential (primary) hypertension: Secondary | ICD-10-CM

## 2021-07-21 NOTE — Telephone Encounter (Signed)
You've never written this medication for patient, please advise.

## 2021-07-25 DIAGNOSIS — G4733 Obstructive sleep apnea (adult) (pediatric): Secondary | ICD-10-CM | POA: Diagnosis not present

## 2021-07-27 DIAGNOSIS — R0902 Hypoxemia: Secondary | ICD-10-CM | POA: Diagnosis not present

## 2021-07-27 DIAGNOSIS — I10 Essential (primary) hypertension: Secondary | ICD-10-CM | POA: Diagnosis not present

## 2021-07-29 ENCOUNTER — Ambulatory Visit: Payer: Medicare HMO | Admitting: Psychiatry

## 2021-08-08 ENCOUNTER — Encounter: Payer: Self-pay | Admitting: Physician Assistant

## 2021-08-08 ENCOUNTER — Ambulatory Visit (INDEPENDENT_AMBULATORY_CARE_PROVIDER_SITE_OTHER): Payer: Medicare HMO | Admitting: Physician Assistant

## 2021-08-08 ENCOUNTER — Other Ambulatory Visit: Payer: Self-pay

## 2021-08-08 VITALS — BP 156/72 | HR 82 | Temp 98.2°F | Wt 246.2 lb

## 2021-08-08 DIAGNOSIS — R0981 Nasal congestion: Secondary | ICD-10-CM | POA: Diagnosis not present

## 2021-08-08 DIAGNOSIS — H53143 Visual discomfort, bilateral: Secondary | ICD-10-CM | POA: Diagnosis not present

## 2021-08-08 DIAGNOSIS — R051 Acute cough: Secondary | ICD-10-CM

## 2021-08-08 DIAGNOSIS — H1033 Unspecified acute conjunctivitis, bilateral: Secondary | ICD-10-CM | POA: Diagnosis not present

## 2021-08-08 DIAGNOSIS — H5713 Ocular pain, bilateral: Secondary | ICD-10-CM

## 2021-08-08 DIAGNOSIS — H539 Unspecified visual disturbance: Secondary | ICD-10-CM

## 2021-08-08 DIAGNOSIS — H5789 Other specified disorders of eye and adnexa: Secondary | ICD-10-CM

## 2021-08-08 LAB — POCT INFLUENZA A/B
Influenza A, POC: NEGATIVE
Influenza B, POC: NEGATIVE

## 2021-08-08 MED ORDER — POLYMYXIN B-TRIMETHOPRIM 10000-0.1 UNIT/ML-% OP SOLN: 1.0000 [drp] | OPHTHALMIC | 0 refills | Status: DC

## 2021-08-08 NOTE — Progress Notes (Signed)
Subjective:    Patient ID: Jake [name], male    DOB: Jan 08, 1961, 60 y.o.   MRN: [id number]  HPI Pt is a 60 yo male with CHF, CAD, HTN, IPF, Cirrhosis who presents to the clinic with bilateral red, painful eyes with discharge for 4 days. He has had to use O2 a little more but no significant shortness of breath. Taking tylenol cold and sinus with no relief. Sinus symptoms are are not bad his eye symptoms are what is the worst. No fever, chills body aches. No other sick contacts. He is a contact lens wearer. His eyes are red, painful, and sensitive to light. No vision changes but vision is blurry. Describes discharge as white.   .. 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[place]) 05/27/2017   History of pancreatitis 05/27/2017   Diverticulitis 05/27/2017   Elevated fasting glucose 06/10/2017   Low testosterone 06/10/2017   Benign prostatic hyperplasia (BPH) with urinary urgency 06/12/2017   No energy 06/12/2017   Former smoker 07/25/2017   Cough 07/25/2017   Low testosterone in male 07/25/2017   Non-restorative sleep 07/25/2017   Anxiety 10/30/2015   Chronic nausea 10/30/2015   Major depressive disorder 10/30/2015   Migraine 10/30/2015   Neck pain 10/30/2015   Occipital neuralgia 10/30/2015   Substance abuse in remission ([place]) 04/07/2018   Bipolar disorder ([place]) 07/04/2018   Hearing difficulty of right ear 01/16/2019   IFG (impaired fasting glucose) 03/20/2019   Coronary artery disease due to calcified coronary  lesion 11/03/2019   Thoracic aorta atherosclerosis ([place]) 11/03/2019   Aortic atherosclerosis ([place]) 11/03/2019   Hepatic steatosis 11/03/2019   SOB (shortness of breath) on exertion 11/06/2019   Precordial pain 11/06/2019   Essential hypertension 11/06/2019   Obesity (BMI 30-39.9) 11/06/2019   Aneurysm of ascending aorta 11/10/2019   Hypoxia 02/21/2020   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/2020   Sinobronchitis 07/10/2020   Decreased cardiac ejection fraction 11/14/2020   Cardiomyopathy ([place]) 11/14/2020   Dyspnea 11/14/2020   Left lower quadrant abdominal pain 62/69/4854   Chronic systolic congestive heart failure, NYHA class 3 ([place]) 05/30/2021   IPF (idiopathic pulmonary fibrosis) ([place]) 06/10/2021   Pre-diabetes 06/10/2021   Class 1 obesity due to excess calories with serious comorbidity and body mass index (BMI) of 34.0 to 34.9 in adult 06/10/2021   Cirrhosis of liver without ascites ([place]) 06/15/2021   Dysfunction of both eustachian tubes 07/08/2021   Eye discharge 08/11/2021   Photophobia of both eyes 08/11/2021   Eye pain, bilateral 08/11/2021   Resolved Ambulatory Problems    Diagnosis Date Noted   Essential hypertension, benign 05/19/2016   Community acquired pneumonia of left lower lobe of lung 11/20/2016   Impacted cerumen of right ear 01/16/2019   Past Medical History:  Diagnosis Date   Arthritis    Hypertension      Review of Systems    See HPI.  Objective:   Physical Exam Vitals reviewed.  Constitutional:      Appearance: Normal appearance. He is obese.  HENT:  Head: Normocephalic.     Right Ear: Tympanic membrane, ear canal and external ear normal. There is no impacted cerumen.     Left Ear: Tympanic membrane, ear canal and external ear normal. There is no impacted cerumen.     Nose: Nose normal. No rhinorrhea.     Mouth/Throat:     Mouth: Mucous membranes are moist.     Pharynx: No posterior oropharyngeal erythema.  Eyes:     General:        Right  eye: Discharge present.        Left eye: Discharge present.    Comments: Bilateral erythematous conjunctiva with watery to white discharge in office. Left a little worse than right.  A little hazy appearance over left cornea Bilateral good light reflex Upper or lower lid edema absent.   Neck:     Vascular: No carotid bruit.  Cardiovascular:     Rate and Rhythm: Normal rate and regular rhythm.  Pulmonary:     Effort: Pulmonary effort is normal.     Breath sounds: Normal breath sounds. No rhonchi.  Musculoskeletal:     Cervical back: Normal range of motion and neck supple.  Lymphadenopathy:     Cervical: No cervical adenopathy.  Neurological:     General: No focal deficit present.     Mental Status: He is alert.  Psychiatric:        Mood and Affect: Mood normal.      .. Results for orders placed or performed in visit on 08/08/21  POCT Influenza A/B  Result Value Ref Range   Influenza A, POC Negative Negative   Influenza B, POC Negative Negative   .[name] Kitchen Vision Screening   Right eye Left eye Both eyes  Without correction     With correction 20/25 20/25 20/20         Assessment & Plan:  [name] KitchenMarland KitchenDiagnoses and all orders for this visit:  Acute bacterial conjunctivitis of both eyes -     trimethoprim-polymyxin b (POLYTRIM) ophthalmic solution; Place 1 drop into both eyes every 4 (four) hours. For 10 days. -     Ambulatory referral to Ophthalmology  Acute cough -     POCT Influenza A/B  Sinus congestion  Eye pain, bilateral -     trimethoprim-polymyxin b (POLYTRIM) ophthalmic solution; Place 1 drop into both eyes every 4 (four) hours. For 10 days. -     Ambulatory referral to Ophthalmology  Eye discharge -     trimethoprim-polymyxin b (POLYTRIM) ophthalmic solution; Place 1 drop into both eyes every 4 (four) hours. For 10 days. -     Ambulatory referral to Ophthalmology  Photophobia of both eyes -     trimethoprim-polymyxin b (POLYTRIM) ophthalmic solution; Place 1 drop  into both eyes every 4 (four) hours. For 10 days. -     Ambulatory referral to Ophthalmology  Vision changes -     trimethoprim-polymyxin b (POLYTRIM) ophthalmic solution; Place 1 drop into both eyes every 4 (four) hours. For 10 days. -     Ambulatory referral to Ophthalmology   Discussed allergic vs viral vs bacterial conjunctivitis.  No cause for acute allergic conjunctivitis.  Viral vs bacterial. Due to pain and discharge will treat for bacterial.  Pt is having eye pain and redness.  Vision is great today.  Treated for bacterial conjunctivitis but some concern for keratitis.  Throw out contacts and do not wear until checked by opthamologist.  Urgent referral made today but it is Friday.  If  eye pain or vision changes worsen then go to ED.

## 2021-08-08 NOTE — Patient Instructions (Signed)
Bacterial Conjunctivitis, Adult Bacterial conjunctivitis is an infection of your conjunctiva. This is the clear membrane that covers the white part of your eye and the inner part of your eyelid. This infection can make your eye: Red or pink. Itchy or irritated. This condition spreads easily from person to person (is contagious) and from one eye to the other eye. What are the causes? This condition is caused by germs (bacteria). You may get the infection if you come into close contact with: A person who has the infection. Items that have germs on them (are contaminated), such as face towels, contact lens solution, or eye makeup. What increases the risk? You are more likely to get this condition if: You have contact with people who have the infection. You wear contact lenses. You have a sinus infection. You have had a recent eye injury or surgery. You have a weak body defense system (immune system). You have dry eyes. What are the signs or symptoms?  Thick, yellowish discharge from the eye. Tearing or watery eyes. Itchy eyes. Burning feeling in your eyes. Eye redness. Swollen eyelids. Blurred vision. How is this treated?  Antibiotic eye drops or ointment. Antibiotic medicine taken by mouth. This is used for infections that do not get better with drops or ointment or that last more than 10 days. Cool, wet cloths placed on the eyes. Artificial tears used 2-6 times a day. Follow these instructions at home: Medicines Take or apply your antibiotic medicine as told by your doctor. Do not stop using it even if you start to feel better. Take or apply over-the-counter and prescription medicines only as told by your doctor. Do not touch your eyelid with the eye-drop bottle or the ointment tube. Managing discomfort Wipe any fluid from your eye with a warm, wet washcloth or a cotton ball. Place a clean, cool, wet cloth on your eye. Do this for 10-20 minutes, 3-4 times a day. General  instructions Do not wear contacts until the infection is gone. Wear glasses until your doctor says it is okay to wear contacts again. Do not wear eye makeup until the infection is gone. Throw away old eye makeup. Change or wash your pillowcase every day. Do not share towels or washcloths. Wash your hands often with soap and water for at least 20 seconds and especially before touching your face or eyes. Use paper towels to dry your hands. Do not touch or rub your eyes. Do not drive or use heavy machinery if your vision is blurred. Contact a doctor if: You have a fever. You do not get better after 10 days. Get help right away if: You have a fever and your symptoms get worse all of a sudden. You have very bad pain when you move your eye. Your face: Hurts. Is red. Is swollen. You have sudden loss of vision. Summary Bacterial conjunctivitis is an infection of your conjunctiva. This infection spreads easily from person to person. Wash your hands often with soap and water for at least 20 seconds and especially before touching your face or eyes. Use paper towels to dry your hands. Take or apply your antibiotic medicine as told by your doctor. Contact a doctor if you have a fever or you do not get better after 10 days. This information is not intended to replace advice given to you by your health care provider. Make sure you discuss any questions you have with your health care provider. Document Revised: 12/04/2020 Document Reviewed: 12/04/2020 Elsevier Patient Education    2022 [employer] Inc.  

## 2021-08-11 DIAGNOSIS — H53143 Visual discomfort, bilateral: Secondary | ICD-10-CM | POA: Insufficient documentation

## 2021-08-11 DIAGNOSIS — H5789 Other specified disorders of eye and adnexa: Secondary | ICD-10-CM | POA: Insufficient documentation

## 2021-08-11 DIAGNOSIS — H5713 Ocular pain, bilateral: Secondary | ICD-10-CM | POA: Insufficient documentation

## 2021-08-12 DIAGNOSIS — H11421 Conjunctival edema, right eye: Secondary | ICD-10-CM | POA: Diagnosis not present

## 2021-08-12 DIAGNOSIS — H02403 Unspecified ptosis of bilateral eyelids: Secondary | ICD-10-CM | POA: Diagnosis not present

## 2021-08-12 DIAGNOSIS — H2513 Age-related nuclear cataract, bilateral: Secondary | ICD-10-CM | POA: Diagnosis not present

## 2021-08-12 DIAGNOSIS — H109 Unspecified conjunctivitis: Secondary | ICD-10-CM | POA: Diagnosis not present

## 2021-08-18 ENCOUNTER — Other Ambulatory Visit: Payer: Self-pay | Admitting: Physician Assistant

## 2021-08-18 DIAGNOSIS — I10 Essential (primary) hypertension: Secondary | ICD-10-CM | POA: Diagnosis not present

## 2021-08-18 DIAGNOSIS — I428 Other cardiomyopathies: Secondary | ICD-10-CM | POA: Diagnosis not present

## 2021-08-18 DIAGNOSIS — I5022 Chronic systolic (congestive) heart failure: Secondary | ICD-10-CM | POA: Diagnosis not present

## 2021-08-22 ENCOUNTER — Ambulatory Visit (INDEPENDENT_AMBULATORY_CARE_PROVIDER_SITE_OTHER): Payer: Medicare HMO | Admitting: Medical-Surgical

## 2021-08-22 ENCOUNTER — Other Ambulatory Visit: Payer: Self-pay

## 2021-08-22 VITALS — BP 138/75 | HR 76 | Temp 97.9°F

## 2021-08-22 DIAGNOSIS — R051 Acute cough: Secondary | ICD-10-CM

## 2021-08-22 MED ORDER — METHYLPREDNISOLONE SODIUM SUCC 125 MG IJ SOLR
125.0000 mg | Freq: Once | INTRAMUSCULAR | Status: AC
  Administered 2021-08-22: 125 mg via INTRAMUSCULAR

## 2021-08-22 NOTE — Progress Notes (Signed)
Pt here for injection of Solu-Medrol 125mg  per [name], PA-C for bronchitis.  SoluMedrol 125mg  administered in right upper outer quadrant.  Pt tolerated injection well.  Pt's SpO2 in office of 93 on RA.  Pt states that he is usually on O2, but he didn't feel like "dragging" it into the office so he left it in the car.  Advised pt to get back on his O2 when he returns to the car.  Charyl Bigger, CMA

## 2021-08-22 NOTE — Progress Notes (Signed)
Agree with above.  ___________________________________________ Clearnce Sorrel, DNP, APRN, FNP-BC [facility] [place]

## 2021-08-24 DIAGNOSIS — G4733 Obstructive sleep apnea (adult) (pediatric): Secondary | ICD-10-CM | POA: Diagnosis not present

## 2021-08-25 ENCOUNTER — Ambulatory Visit: Payer: Medicare HMO

## 2021-08-26 DIAGNOSIS — R0902 Hypoxemia: Secondary | ICD-10-CM | POA: Diagnosis not present

## 2021-08-26 DIAGNOSIS — I10 Essential (primary) hypertension: Secondary | ICD-10-CM | POA: Diagnosis not present

## 2021-09-03 ENCOUNTER — Encounter (HOSPITAL_BASED_OUTPATIENT_CLINIC_OR_DEPARTMENT_OTHER): Payer: Self-pay

## 2021-09-03 ENCOUNTER — Emergency Department (HOSPITAL_BASED_OUTPATIENT_CLINIC_OR_DEPARTMENT_OTHER): Payer: Medicare HMO

## 2021-09-03 ENCOUNTER — Emergency Department (HOSPITAL_BASED_OUTPATIENT_CLINIC_OR_DEPARTMENT_OTHER)
Admission: EM | Admit: 2021-09-03 | Discharge: 2021-09-04 | Disposition: A | Discharge status: Home / Self Care | Payer: Medicare HMO | Attending: Emergency Medicine | Admitting: Emergency Medicine

## 2021-09-03 ENCOUNTER — Other Ambulatory Visit: Payer: Self-pay | Admitting: Physician Assistant

## 2021-09-03 ENCOUNTER — Other Ambulatory Visit: Payer: Self-pay

## 2021-09-03 ENCOUNTER — Telehealth: Payer: Self-pay | Admitting: Physician Assistant

## 2021-09-03 DIAGNOSIS — Z79899 Other long term (current) drug therapy: Secondary | ICD-10-CM | POA: Diagnosis not present

## 2021-09-03 DIAGNOSIS — I2510 Atherosclerotic heart disease of native coronary artery without angina pectoris: Secondary | ICD-10-CM | POA: Insufficient documentation

## 2021-09-03 DIAGNOSIS — I110 Hypertensive heart disease with heart failure: Secondary | ICD-10-CM | POA: Diagnosis not present

## 2021-09-03 DIAGNOSIS — I5022 Chronic systolic (congestive) heart failure: Secondary | ICD-10-CM | POA: Insufficient documentation

## 2021-09-03 DIAGNOSIS — I517 Cardiomegaly: Secondary | ICD-10-CM | POA: Diagnosis not present

## 2021-09-03 DIAGNOSIS — R0602 Shortness of breath: Secondary | ICD-10-CM | POA: Insufficient documentation

## 2021-09-03 DIAGNOSIS — J9811 Atelectasis: Secondary | ICD-10-CM | POA: Diagnosis not present

## 2021-09-03 DIAGNOSIS — Z87891 Personal history of nicotine dependence: Secondary | ICD-10-CM | POA: Insufficient documentation

## 2021-09-03 DIAGNOSIS — K760 Fatty (change of) liver, not elsewhere classified: Secondary | ICD-10-CM | POA: Diagnosis not present

## 2021-09-03 LAB — BASIC METABOLIC PANEL
Anion gap: 10 (ref 5–15)
BUN: 23 mg/dL — ABNORMAL HIGH (ref 6–20)
CO2: 28 mmol/L (ref 22–32)
Calcium: 9.4 mg/dL (ref 8.9–10.3)
Chloride: 95 mmol/L — ABNORMAL LOW (ref 98–111)
Creatinine, Ser: 1.01 mg/dL (ref 0.61–1.24)
GFR, Estimated: >60 mL/min (ref >60)
Glucose, Bld: 105 mg/dL — ABNORMAL HIGH (ref 70–99)
Potassium: 3.5 mmol/L (ref 3.5–5.1)
Sodium: 133 mmol/L — ABNORMAL LOW (ref 135–145)

## 2021-09-03 LAB — CBC
HCT: 40.2 % (ref 39.0–52.0)
Hemoglobin: 13.8 g/dL (ref 13.0–17.0)
MCH: 28.5 pg (ref 26.0–34.0)
MCHC: 34.3 g/dL (ref 30.0–36.0)
MCV: 82.9 fL (ref 80.0–100.0)
Platelets: 208 10*3/uL (ref 150–400)
RBC: 4.85 MIL/uL (ref 4.22–5.81)
RDW: 14 % (ref 11.5–15.5)
WBC: 8.6 10*3/uL (ref 4.0–10.5)
nRBC: 0 % (ref 0.0–0.2)

## 2021-09-03 LAB — TROPONIN I (HIGH SENSITIVITY)
Troponin I (High Sensitivity): 9 ng/L (ref <18)
Troponin I (High Sensitivity): 10 ng/L (ref <18)

## 2021-09-03 LAB — BRAIN NATRIURETIC PEPTIDE: B Natriuretic Peptide: 52.1 pg/mL (ref 0.0–100.0)

## 2021-09-03 MED ORDER — FUROSEMIDE 10 MG/ML IJ SOLN
40.0000 mg | Freq: Once | INTRAMUSCULAR | Status: AC
  Administered 2021-09-03: 23:00:00 40 mg via INTRAVENOUS
  Filled 2021-09-03: qty 4

## 2021-09-03 MED ORDER — HYDROCORTISONE SOD SUC (PF) 100 MG IJ SOLR
200.0000 mg | Freq: Once | INTRAMUSCULAR | Status: AC
  Administered 2021-09-03: 21:00:00 200 mg via INTRAVENOUS
  Filled 2021-09-03: qty 4

## 2021-09-03 MED ORDER — DIPHENHYDRAMINE HCL 25 MG PO CAPS
50.0000 mg | ORAL_CAPSULE | Freq: Once | ORAL | Status: AC
  Administered 2021-09-04: 50 mg via ORAL
  Filled 2021-09-03: qty 2

## 2021-09-03 MED ORDER — DIPHENHYDRAMINE HCL 50 MG/ML IJ SOLN: 50.0000 mg | Freq: Once | INTRAMUSCULAR | Status: AC

## 2021-09-03 MED ORDER — ACETAMINOPHEN 500 MG PO TABS
1000.0000 mg | ORAL_TABLET | Freq: Once | ORAL | Status: AC
  Administered 2021-09-03: 23:00:00 1000 mg via ORAL
  Filled 2021-09-03: qty 2

## 2021-09-03 NOTE — ED Provider Notes (Signed)
°  Provider Note MRN:  [id number]  Arrival date & time: 09/04/21    ED Course and Medical Decision Making  Assumed care from Dr. Dina Rich at shift change.  Favoring CHF exacerbation awaiting CT PE study.  If doing well on baseline 3 L of oxygen and without PE, could be a candidate for discharge if diuresis well.  CT is without evidence of PE.  There is question of edema on CT.  Favoring CHF, has follow-up with cardiology this week.  Good diuresis.  On reassessment sitting comfortably on baseline 3 L nasal cannula.  Appropriate for discharge.  Procedures  Final Clinical Impressions(s) / ED Diagnoses     ICD-10-CM   1. SOB (shortness of breath)  R06.02       ED Discharge Orders     None         Discharge Instructions      You were evaluated in the Emergency Department and after careful evaluation, we did not find any emergent condition requiring admission or further testing in the hospital.  Your exam/testing today was overall reassuring.  Symptoms may be due to a flare of your heart failure.  Keep your follow-up appointment this week and discuss your symptoms.  Please return to the Emergency Department if you experience any worsening of your condition.  Thank you for allowing [place] to be a part of your care.       Barth Kirks. [place] Small, [place] [email or web address]    Maudie Flakes, MD 09/04/21 [phone]

## 2021-09-03 NOTE — ED Notes (Signed)
EKG to EDP-pt to room 9 via w/c

## 2021-09-03 NOTE — Telephone Encounter (Signed)
Pt's wife called at 3:30 and reported pt BP was 188/88. He was instructed to take full tablet of chlorthalidone today and increase losartan to 100mg . Continue to monitor BP. Appt made for Friday. He has continued to have shortness of breath with pulse ox in low 90s on 2-3L of O2.

## 2021-09-03 NOTE — ED Notes (Signed)
Lab notified to add-on BNP to previously collected lab work.

## 2021-09-03 NOTE — ED Triage Notes (Signed)
Pt c/o SB, CP x 2 hours-states he has been having flu like sx x 1 month-to triage in w/c with O2 3L [place] that he wears 24/7

## 2021-09-03 NOTE — ED Notes (Signed)
Pt reports he does not have "allergy to IV contrast, it just makes me nervous.....its been about 10 years since I had palpitations."  EDP [facility] made aware, asked RN to f/u with CT for recommendations.  Per CT staff, they will confer with radiologist and determine course of action.

## 2021-09-03 NOTE — ED Notes (Signed)
Per CT staff, pt should still be premedicated prior to CT PE study.  EDP Horton made aware.

## 2021-09-03 NOTE — ED Provider Notes (Addendum)
[name] Provider Note   CSN: [id number] Arrival date & time: 09/03/21  5035     History Chief Complaint  Patient presents with   Shortness of Breath    J[name] is a 60 y.o. male.  HPI  60 year old male with past medical history of HTN, HLD, obesity, OSA on chronic nasal cannula oxygen presents emergency department with worsening shortness of breath.  Patient states for the past month he has been having worsening shortness of breath, particularly with exertion.  Over the last couple days it is progressed to shortness of breath even at rest.  He is now having to wear his 3 L nasal cannula that was intermittent all of the time.  At times he has had increase his liter requirement as well.  He denies any productive cough, fever, chills, lower extremity swelling.  He is had intermittent chest heaviness but no ongoing chest pain or back pain.  No new medications.  Reports that he recently had an echo as an outpatient with his cardiologist.  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    Patient Active Problem List   Diagnosis Date Noted   Eye discharge 08/11/2021   Photophobia of both eyes 08/11/2021   Eye pain, bilateral 08/11/2021   Dysfunction of both eustachian tubes 07/08/2021   Cirrhosis of liver without ascites ([place]) 06/15/2021   IPF (idiopathic pulmonary fibrosis) ([place]) 06/10/2021   Pre-diabetes 06/10/2021   Class 1 obesity due to excess calories with serious comorbidity and body mass index (BMI) of 34.0 to 34.9 in adult 46/56/8127   Chronic systolic congestive heart failure, NYHA class 3 ([place]) 05/30/2021   Left lower quadrant abdominal pain 04/11/2021   Decreased cardiac ejection fraction 11/14/2020   Cardiomyopathy ([place]) 11/14/2020   Dyspnea  11/14/2020   Sinobronchitis 07/10/2020   Hypoxia 02/21/2020   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/2020   Aneurysm of ascending aorta 11/10/2019   SOB (shortness of breath) on exertion 11/06/2019   Precordial pain 11/06/2019   Essential hypertension 11/06/2019   Obesity (BMI 30-39.9) 11/06/2019   Coronary artery disease due to calcified coronary lesion 11/03/2019   Thoracic aorta atherosclerosis ([place]) 11/03/2019   Aortic atherosclerosis ([place]) 11/03/2019   Hepatic steatosis 11/03/2019   IFG (impaired fasting glucose) 03/20/2019   Hearing difficulty of right ear 01/16/2019   Bipolar disorder ([place]) 07/04/2018   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/07/2018   Former smoker 07/25/2017   Cough 07/25/2017   Low testosterone in male 07/25/2017   Non-restorative sleep 07/25/2017   Benign prostatic hyperplasia (BPH) with urinary urgency 06/12/2017   No energy 06/12/2017   Elevated fasting glucose 06/10/2017   Low testosterone 06/10/2017   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[place]) 05/27/2017   History of pancreatitis 05/27/2017   Diverticulitis 05/27/2017   Atelectasis of both lungs 11/20/2016   Hyperlipidemia 05/19/2016   Hypertriglyceridemia 05/19/2016   Diverticulosis of colon without hemorrhage 05/19/2016   IBS (irritable bowel syndrome) 05/19/2016   GERD (gastroesophageal reflux disease) 05/19/2016   Allergic rhinitis 05/19/2016   OSA on CPAP 05/19/2016   Generalized anxiety disorder 05/19/2016   Mood changes 05/19/2016   Low back pain 05/05/2016   Muscle spasm of back 05/05/2016   Anxiety 10/30/2015   Chronic nausea 10/30/2015   Major  depressive disorder 10/30/2015   Migraine 10/30/2015   Neck pain 10/30/2015   Occipital neuralgia 10/30/2015    Past Surgical History:  Procedure Laterality Date   ANKLE FRACTURE SURGERY Left    APPENDECTOMY     CERVICAL FUSION     CHOLECYSTECTOMY     COLON SURGERY     HERNIA REPAIR         Family History  Problem Relation Age of Onset   Cancer  Mother        breast   Depression Mother    Hyperlipidemia Mother    Dementia Mother    Alcohol abuse Father    Cancer Father        skin   Hyperlipidemia Father    Alzheimer's disease Father    Cancer Maternal Grandfather    Cancer Paternal Grandmother     Social History   Tobacco Use   Smoking status: Former    Packs/day: 2.00    Years: 25.00    Pack years: 50.00    Types: Cigarettes    Start date: 1982    Quit date: 2010    Years since quitting: 12.9   Smokeless tobacco: Never  Vaping Use   Vaping Use: Never used  Substance Use Topics   Alcohol use: No   Drug use: No    Comment: 04/06/2018 Patient's wife wanted [place] to be very aware that her husband is a recovering narcotic abuser    Home Medications Prior to Admission medications   Medication Sig Start Date End Date Taking? Authorizing Provider  albuterol (VENTOLIN HFA) 108 (90 Base) MCG/ACT inhaler INHALE 2 PUFFS BY MOUTH EVERY 6 HOURS AS NEEDED 06/02/21   Breeback, Jade L, PA-C  AMBULATORY NON FORMULARY MEDICATION Continuous positive airway pressure (CPAP) machine set at autotitration of H2O pressure, with all supplemental supplies as needed. 02/21/20   Breeback, Jade L, PA-C  azelastine (ASTELIN) 0.1 % nasal spray Place 2 sprays into both nostrils 2 (two) times daily. Use in each nostril as directed 07/08/21   [name] L, PA-C  chlorthalidone (HYGROTON) 25 MG tablet Take 0.5 tablets (12.5 mg total) by mouth daily. 06/10/21   Donella Stade, PA-C  colestipol (COLESTID) 1 g tablet Take 1 tablet by mouth once daily 06/26/21   Breeback, Jade L, PA-C  dicyclomine (BENTYL) 20 MG tablet TAKE 1 TABLET BY MOUTH THREE TIMES DAILY 04/28/21   Breeback, Jade L, PA-C  finasteride (PROSCAR) 5 MG tablet Take 1 tablet (5 mg total) by mouth daily. 07/08/21   Breeback, Jade L, PA-C  ibuprofen (ADVIL) 800 MG tablet TAKE 1 TABLET BY MOUTH EVERY 8 HOURS AS NEEDED 07/21/21   Breeback, Jade L, PA-C  loratadine (CLARITIN) 10 MG tablet Take  10 mg by mouth daily.    [provider]  losartan (COZAAR) 50 MG tablet Take 1 tablet by mouth once daily 07/21/21   Breeback, Jade L, PA-C  metoprolol succinate (TOPROL-XL) 25 MG 24 hr tablet Take 50 mg by mouth in the morning and at bedtime. 01/23/21   [provider]  modafinil (PROVIGIL) 200 MG tablet Take 1/2-1 tab po q am. May also take 1/2 tab in the morning and 1/2 tab mid-day. 05/14/21   [place] Headings, PMHNP  Multiple Vitamin (MULTIVITAMIN) tablet Take 1 tablet by mouth daily.     [provider]  OLANZapine (ZYPREXA) 2.5 MG tablet Take 1 tab at bedtime for 3 weeks, then take 1/2 tablet for 2 weeks, then stop. 05/01/21  [place] Headings, PMHNP  Omega-3 Fatty Acids (FISH OIL) 1200 MG CAPS Take 1,200 mg by mouth daily.    [provider]  ondansetron (ZOFRAN) 8 MG tablet TAKE 1 TABLET BY MOUTH EVERY 8 HOURS AS NEEDED FOR NAUSEA FOR VOMITING 07/21/21   Breeback, Jade L, PA-C  pantoprazole (PROTONIX) 40 MG tablet Take 1 tablet by mouth twice daily 08/19/21   Breeback, Jade L, PA-C  sertraline (ZOLOFT) 100 MG tablet TAKE 1 & 1/2 (ONE & ONE-HALF) TABLETS BY MOUTH ONCE DAILY 05/01/21   [place] Headings, PMHNP  sodium bicarbonate 650 MG tablet Take 1 tablet by mouth 4 times daily 04/21/21   Breeback, Jade L, PA-C  ZYPITAMAG 2 MG TABS TAKE 1 TABLET BY MOUTH DAILY. 09/11/20   Breeback, Jade L, PA-C    Allergies    Fentanyl, Other, Aspirin, Atorvastatin, Prednisone, Contrast media [iodinated contrast media], and Nitroglycerin  Review of Systems   Review of Systems  Constitutional:  Positive for fatigue. Negative for chills and fever.  HENT:  Negative for congestion.   Eyes:  Negative for visual disturbance.  Respiratory:  Positive for chest tightness and shortness of breath.   Cardiovascular:  Negative for chest pain, palpitations and leg swelling.  Gastrointestinal:  Negative for abdominal pain, diarrhea and vomiting.  Genitourinary:  Negative for dysuria and  flank pain.  Musculoskeletal:  Negative for back pain.  Skin:  Negative for rash.  Neurological:  Negative for headaches.   Physical Exam Updated Vital Signs BP (!) 154/88    Pulse 100    Temp 97.9 F (36.6 C) (Oral)    Resp 17    Ht 5\' 11"  (1.803 m)    Wt 108.9 kg    SpO2 97%    BMI 33.47 kg/m   Physical Exam Vitals and nursing note reviewed.  Constitutional:      General: He is not in acute distress.    Appearance: Normal appearance.  HENT:     Head: Normocephalic.     Mouth/Throat:     Mouth: Mucous membranes are moist.  Cardiovascular:     Rate and Rhythm: Normal rate.  Pulmonary:     Effort: Pulmonary effort is normal. No respiratory distress.     Breath sounds: Rales present.  Chest:     Chest wall: No tenderness.  Abdominal:     Palpations: Abdomen is soft.     Tenderness: There is no abdominal tenderness.  Musculoskeletal:     Right lower leg: No edema.     Left lower leg: No edema.  Skin:    General: Skin is warm.  Neurological:     Mental Status: He is alert and oriented to person, place, and time. Mental status is at baseline.  Psychiatric:        Mood and Affect: Mood normal.    ED Results / Procedures / Treatments   Labs (all labs ordered are listed, but only abnormal results are displayed) Labs Reviewed  BASIC METABOLIC PANEL - Abnormal; Notable for the following components:      Result Value   Sodium 133 (*)    Chloride 95 (*)    Glucose, Bld 105 (*)    BUN 23 (*)    All other components within normal limits  CBC  BRAIN NATRIURETIC PEPTIDE  TROPONIN I (HIGH SENSITIVITY)  TROPONIN I (HIGH SENSITIVITY)    EKG None  Radiology DG Chest Portable 1 View  Result Date: 09/03/2021 CLINICAL DATA:  Shortness of breath. EXAM: PORTABLE CHEST  1 VIEW COMPARISON:  05/30/2021 FINDINGS: The cardiac silhouette, mediastinal and hilar contours are within normal limits given the AP projection and portable technique. Low lung volumes with vascular crowding and  streaky basilar atelectasis. No definite infiltrates or effusions. The bony thorax is intact. IMPRESSION: Low lung volumes with vascular crowding and streaky basilar atelectasis. No definite infiltrates or effusions. Electronically Signed   By: Marijo Sanes M.D.   On: 09/03/2021 19:18    Procedures Procedures   Medications Ordered in ED Medications - No data to display  ED Course  I have reviewed the triage vital signs and the nursing notes.  Pertinent labs & imaging results that were available during my care of the patient were reviewed by me and considered in my medical decision making (see chart for details).    MDM Rules/Calculators/A&P                          60 year old male with chronic O2 requirement presents emergency department for worsening shortness of breath, chest tightness.  Patient is tachycardic, tachypneic on arrival.  Conversationally dyspneic.  Scattered rales on lung auscultation, no respiratory distress.  Currently on 3 L nasal cannula with no hypoxia.  EKG shows LBBB, new in our system but known to be baseline for the patient per other documents and patient/spouse.  Blood work is reassuring, cardiac work-up is negative.  Chest x-ray shows bronchovascular congestion, in my opinion looks slightly like CHF exacerbation, however BNP is normal.  This does not exclude this, will attempt for diuresis.  However with his ongoing dyspnea and degree of shortness of breath we will pursue CT PE study to rule out PE in the setting of tachycardia.  Of note patient has a "allergy" to contrast which is listed as palpitations.  This in my opinion is not a true allergy, we consulted with the CT department who still recommends pretreatment.  This has been ordered.  Plan for diuresis, CT PE study and reevaluation. Patient signed out to Dr. Sedonia Small.      Final Clinical Impression(s) / ED Diagnoses Final diagnoses:  None    Rx / DC Orders ED Discharge Orders     None         Lorelle Gibbs, DO 09/03/21 2306    Lorelle Gibbs, DO 09/03/21 2308

## 2021-09-04 ENCOUNTER — Encounter (HOSPITAL_BASED_OUTPATIENT_CLINIC_OR_DEPARTMENT_OTHER): Payer: Self-pay | Admitting: Radiology

## 2021-09-04 DIAGNOSIS — I517 Cardiomegaly: Secondary | ICD-10-CM | POA: Diagnosis not present

## 2021-09-04 DIAGNOSIS — K760 Fatty (change of) liver, not elsewhere classified: Secondary | ICD-10-CM | POA: Diagnosis not present

## 2021-09-04 DIAGNOSIS — R0602 Shortness of breath: Secondary | ICD-10-CM | POA: Diagnosis not present

## 2021-09-04 MED ORDER — IOHEXOL 350 MG/ML SOLN
100.0000 mL | Freq: Once | INTRAVENOUS | Status: AC | PRN
  Administered 2021-09-04: 02:00:00 100 mL via INTRAVENOUS

## 2021-09-04 NOTE — ED Notes (Addendum)
Patient transported to CT 

## 2021-09-04 NOTE — ED Notes (Signed)
Patient returned from CT

## 2021-09-04 NOTE — Discharge Instructions (Signed)
You were evaluated in the Emergency Department and after careful evaluation, we did not find any emergent condition requiring admission or further testing in the hospital.  Your exam/testing today was overall reassuring.  Symptoms may be due to a flare of your heart failure.  Keep your follow-up appointment this week and discuss your symptoms.  Please return to the Emergency Department if you experience any worsening of your condition.  Thank you for allowing [place] to be a part of your care.

## 2021-09-05 ENCOUNTER — Other Ambulatory Visit: Payer: Self-pay

## 2021-09-05 ENCOUNTER — Ambulatory Visit (INDEPENDENT_AMBULATORY_CARE_PROVIDER_SITE_OTHER): Payer: Medicare HMO | Admitting: Physician Assistant

## 2021-09-05 ENCOUNTER — Encounter: Payer: Self-pay | Admitting: Physician Assistant

## 2021-09-05 ENCOUNTER — Ambulatory Visit (INDEPENDENT_AMBULATORY_CARE_PROVIDER_SITE_OTHER): Payer: Medicare HMO

## 2021-09-05 VITALS — BP 117/70 | HR 80 | Temp 97.8°F | Ht 71.0 in | Wt 238.0 lb

## 2021-09-05 DIAGNOSIS — R0602 Shortness of breath: Secondary | ICD-10-CM | POA: Diagnosis not present

## 2021-09-05 DIAGNOSIS — K56609 Unspecified intestinal obstruction, unspecified as to partial versus complete obstruction: Secondary | ICD-10-CM | POA: Diagnosis not present

## 2021-09-05 DIAGNOSIS — R140 Abdominal distension (gaseous): Secondary | ICD-10-CM | POA: Insufficient documentation

## 2021-09-05 DIAGNOSIS — R0902 Hypoxemia: Secondary | ICD-10-CM | POA: Diagnosis not present

## 2021-09-05 DIAGNOSIS — J9811 Atelectasis: Secondary | ICD-10-CM | POA: Diagnosis not present

## 2021-09-05 DIAGNOSIS — R112 Nausea with vomiting, unspecified: Secondary | ICD-10-CM | POA: Diagnosis not present

## 2021-09-05 DIAGNOSIS — R197 Diarrhea, unspecified: Secondary | ICD-10-CM | POA: Diagnosis not present

## 2021-09-05 DIAGNOSIS — K219 Gastro-esophageal reflux disease without esophagitis: Secondary | ICD-10-CM | POA: Diagnosis not present

## 2021-09-05 DIAGNOSIS — I700 Atherosclerosis of aorta: Secondary | ICD-10-CM | POA: Diagnosis not present

## 2021-09-05 DIAGNOSIS — G4733 Obstructive sleep apnea (adult) (pediatric): Secondary | ICD-10-CM

## 2021-09-05 DIAGNOSIS — I10 Essential (primary) hypertension: Secondary | ICD-10-CM

## 2021-09-05 DIAGNOSIS — R1084 Generalized abdominal pain: Secondary | ICD-10-CM

## 2021-09-05 DIAGNOSIS — I130 Hypertensive heart and chronic kidney disease with heart failure and stage 1 through stage 4 chronic kidney disease, or unspecified chronic kidney disease: Secondary | ICD-10-CM | POA: Diagnosis not present

## 2021-09-05 DIAGNOSIS — Z6833 Body mass index (BMI) 33.0-33.9, adult: Secondary | ICD-10-CM | POA: Diagnosis not present

## 2021-09-05 DIAGNOSIS — R760 Raised antibody titer: Secondary | ICD-10-CM | POA: Diagnosis not present

## 2021-09-05 DIAGNOSIS — R161 Splenomegaly, not elsewhere classified: Secondary | ICD-10-CM | POA: Diagnosis not present

## 2021-09-05 DIAGNOSIS — I447 Left bundle-branch block, unspecified: Secondary | ICD-10-CM | POA: Diagnosis not present

## 2021-09-05 DIAGNOSIS — I429 Cardiomyopathy, unspecified: Secondary | ICD-10-CM | POA: Diagnosis not present

## 2021-09-05 DIAGNOSIS — K5790 Diverticulosis of intestine, part unspecified, without perforation or abscess without bleeding: Secondary | ICD-10-CM | POA: Diagnosis not present

## 2021-09-05 DIAGNOSIS — F411 Generalized anxiety disorder: Secondary | ICD-10-CM | POA: Diagnosis not present

## 2021-09-05 DIAGNOSIS — Z4682 Encounter for fitting and adjustment of non-vascular catheter: Secondary | ICD-10-CM | POA: Diagnosis not present

## 2021-09-05 DIAGNOSIS — E669 Obesity, unspecified: Secondary | ICD-10-CM | POA: Diagnosis not present

## 2021-09-05 DIAGNOSIS — I5022 Chronic systolic (congestive) heart failure: Secondary | ICD-10-CM | POA: Diagnosis not present

## 2021-09-05 DIAGNOSIS — J986 Disorders of diaphragm: Secondary | ICD-10-CM | POA: Diagnosis not present

## 2021-09-05 DIAGNOSIS — E785 Hyperlipidemia, unspecified: Secondary | ICD-10-CM | POA: Diagnosis not present

## 2021-09-05 DIAGNOSIS — N2889 Other specified disorders of kidney and ureter: Secondary | ICD-10-CM | POA: Diagnosis not present

## 2021-09-05 DIAGNOSIS — Z7982 Long term (current) use of aspirin: Secondary | ICD-10-CM | POA: Diagnosis not present

## 2021-09-05 DIAGNOSIS — K5989 Other specified functional intestinal disorders: Secondary | ICD-10-CM | POA: Diagnosis not present

## 2021-09-05 DIAGNOSIS — R7303 Prediabetes: Secondary | ICD-10-CM | POA: Diagnosis not present

## 2021-09-05 DIAGNOSIS — N1832 Chronic kidney disease, stage 3b: Secondary | ICD-10-CM | POA: Diagnosis not present

## 2021-09-05 DIAGNOSIS — N179 Acute kidney failure, unspecified: Secondary | ICD-10-CM | POA: Diagnosis not present

## 2021-09-05 DIAGNOSIS — Z9989 Dependence on other enabling machines and devices: Secondary | ICD-10-CM

## 2021-09-05 DIAGNOSIS — Z79899 Other long term (current) drug therapy: Secondary | ICD-10-CM | POA: Diagnosis not present

## 2021-09-05 DIAGNOSIS — U071 COVID-19: Secondary | ICD-10-CM | POA: Diagnosis not present

## 2021-09-05 DIAGNOSIS — Z888 Allergy status to other drugs, medicaments and biological substances status: Secondary | ICD-10-CM | POA: Diagnosis not present

## 2021-09-05 DIAGNOSIS — K6389 Other specified diseases of intestine: Secondary | ICD-10-CM | POA: Diagnosis not present

## 2021-09-05 DIAGNOSIS — N289 Disorder of kidney and ureter, unspecified: Secondary | ICD-10-CM | POA: Diagnosis not present

## 2021-09-05 DIAGNOSIS — R1030 Lower abdominal pain, unspecified: Secondary | ICD-10-CM | POA: Diagnosis not present

## 2021-09-05 DIAGNOSIS — R109 Unspecified abdominal pain: Secondary | ICD-10-CM | POA: Diagnosis not present

## 2021-09-05 DIAGNOSIS — E876 Hypokalemia: Secondary | ICD-10-CM | POA: Diagnosis not present

## 2021-09-05 DIAGNOSIS — K3189 Other diseases of stomach and duodenum: Secondary | ICD-10-CM | POA: Diagnosis not present

## 2021-09-05 DIAGNOSIS — N401 Enlarged prostate with lower urinary tract symptoms: Secondary | ICD-10-CM | POA: Diagnosis not present

## 2021-09-05 DIAGNOSIS — F319 Bipolar disorder, unspecified: Secondary | ICD-10-CM | POA: Diagnosis not present

## 2021-09-05 DIAGNOSIS — R1013 Epigastric pain: Secondary | ICD-10-CM | POA: Diagnosis not present

## 2021-09-05 DIAGNOSIS — Z87891 Personal history of nicotine dependence: Secondary | ICD-10-CM | POA: Diagnosis not present

## 2021-09-05 DIAGNOSIS — R1033 Periumbilical pain: Secondary | ICD-10-CM | POA: Diagnosis not present

## 2021-09-05 DIAGNOSIS — K56699 Other intestinal obstruction unspecified as to partial versus complete obstruction: Secondary | ICD-10-CM | POA: Diagnosis not present

## 2021-09-05 DIAGNOSIS — K529 Noninfective gastroenteritis and colitis, unspecified: Secondary | ICD-10-CM | POA: Diagnosis not present

## 2021-09-05 DIAGNOSIS — R931 Abnormal findings on diagnostic imaging of heart and coronary circulation: Secondary | ICD-10-CM | POA: Diagnosis not present

## 2021-09-05 MED ORDER — CHLORTHALIDONE 25 MG PO TABS: 25.0000 mg | ORAL_TABLET | Freq: Every day | ORAL | 1 refills | Status: DC

## 2021-09-05 NOTE — Patient Instructions (Addendum)
2000 to 3063m of salt a day 50 losartan  220mchlorthaidone Will refer to cardiologist Get labs and stool collection cups after CT   Heart Failure and Exercise Heart failure is a condition in which the heart does not fill or pump enough blood and oxygen to support your body and its functions. Heart failure is a long-term (chronic) condition. Living with heart failure can be challenging. However, following your health care provider's instructions about a healthy lifestyle may help improve your symptoms. This includes choosing the right exercise plan. Doing daily physical activity is important after a diagnosis of heart failure. You may have some activity restrictions, so talk to your health care provider before doing any exercises. What are the benefits of exercise? Exercise may: Make your heart muscles stronger. Lower your blood pressure and cholesterol. Help you lose weight. Help your bones stay strong. Improve your blood circulation. Help your body use oxygen better. This relieves symptoms such as fatigue and shortness of breath. Help your mental health by lowering the risk of depression and other problems. Improve your quality of life. Decrease your chance of hospital admission for heart failure. What is an exercise plan? An exercise plan is a set of specific exercises and training activities. You will work with your health care provider to create the exercise plan that works for you. The plan may include: Different types of exercises and how to do them. Cardiac rehabilitation exercises. These are supervised programs that are designed to strengthen your heart. What are strengthening exercises? Strengthening exercises are a type of physical activity that involves using resistance to improve your muscle strength. Strengthening exercises usually have repetitive motions. These types of exercises can include: Lifting weights. Using weight machines. Using resistance tubes and bands. Using  kettlebells. Using your body weight, such as doing push-ups or squats. What are balance exercises? Balance exercises are another type of physical activity. They strengthen the muscles of the back, abdomen, and pelvis (core muscles) and improve your balance. They can also lower your risk of falling. These types of exercises can include: Standing on one leg. Walking backward, sideways, and in a straight line. Standing up after sitting, without using your hands. Shifting your weight from one leg to the other. Lifting one leg in front of you. Doing tai chi. This is a type of exercise that uses slow movements and deep breathing. How can I increase my flexibility? Having better flexibility can keep you from falling. It can also lengthen your muscles, improve your range of motion, and help your joints. You can increase your flexibility by: Doing tai chi. Doing yoga. Stretching. How much aerobic exercise should I get? Aerobic exercise strengthens your breathing and circulation system and increases your body's use of oxygen. This type of exercise causes your heart to beat faster while you are doing it. Examples of aerobic exercise include biking, walking, running, and swimming. Talk to your health care provider to find out how much aerobic exercise is safe for you. To do this type of exercise: Start exercising slowly, limiting the amount of time at first. You may need to start with 5 minutes of aerobic exercise every day. Slowly add more minutes until you can safely do at least 30 minutes of exercise at least 5 days a week. Summary Daily physical activity is important after a diagnosis of heart failure. Exercise can make your heart muscles stronger. It also offers other benefits that will improve your health. Exercise can decrease your chance of hospital admission for  heart failure. Talk to your health care provider before doing any exercises. This information is not intended to replace advice given  to you by your health care provider. Make sure you discuss any questions you have with your health care provider. Document Revised: 04/08/2020 Document Reviewed: 04/08/2020 Elsevier Patient Education  2022 [employer].

## 2021-09-05 NOTE — Progress Notes (Signed)
Subjective:    Patient ID: [name], male    DOB: July 20, 1961, 60 y.o.   MRN: [id number]  HPI Pt is a 60 yo obese male with cardiomyopathy, CHF, HTN, CAD, OSA, IPF, Cirrhosis, GERD, Bipolar who presents to the clinic to follow up on SOB. Accompanied by his wife.   SOB has been worsening for the last 2 weeks until 12/28 reached a peak and went to ED. BP was elevated and patient called into office and losartan was increased to 100mg  and chlorthalidone 25mg . BP improved and patient has backed down to 50mg  of losartan. CTA negative for PE. EKG no concerns. Tronponin normal. BNP 52. WBC 8.6. saw some general lung ground glass appearance and assumed acute flare of CHF. Started lasix 40mg . Pt was not discharged with any medication. Lasix did cause him to urinate a lot but he does not feel better.   Pt comes in today with worsening SOB, abdominal fullness and pain. He is dry heaving and also having loose stools. Denies any fever, chills, body aches. No appetite. Has not eaten anything new. Hx of diverticulitis and pancreatitis.     .. 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[place]) 05/27/2017   History of pancreatitis 05/27/2017   Diverticulitis 05/27/2017   Elevated fasting glucose 06/10/2017   Low testosterone 06/10/2017   Benign prostatic hyperplasia (BPH) with urinary urgency 06/12/2017   No energy 06/12/2017   Former smoker 07/25/2017   Cough 07/25/2017   Low testosterone in male 07/25/2017   Non-restorative sleep  07/25/2017   Anxiety 10/30/2015   Chronic nausea 10/30/2015   Major depressive disorder 10/30/2015   Migraine 10/30/2015   Neck pain 10/30/2015   Occipital neuralgia 10/30/2015   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/07/2018   Bipolar disorder ([place]) 07/04/2018   Hearing difficulty of right ear 01/16/2019   IFG (impaired fasting glucose) 03/20/2019   Coronary artery disease due to calcified coronary lesion 11/03/2019   Thoracic aorta atherosclerosis ([place]) 11/03/2019   Aortic atherosclerosis ([place]) 11/03/2019   Hepatic steatosis 11/03/2019   SOB (shortness of breath) on exertion 11/06/2019   Precordial pain 11/06/2019   Essential hypertension 11/06/2019   Obesity (BMI 30-39.9) 11/06/2019   Aneurysm of ascending aorta 11/10/2019   Hypoxia 02/21/2020   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/2020   Sinobronchitis 07/10/2020   Decreased cardiac ejection fraction 11/14/2020   Cardiomyopathy ([place]) 11/14/2020   Dyspnea 11/14/2020   Left lower quadrant abdominal pain 62/13/0865   Chronic systolic congestive heart failure, NYHA class 3 ([place]) 05/30/2021   IPF (idiopathic pulmonary fibrosis) ([place]) 06/10/2021   Pre-diabetes 06/10/2021   Class 1 obesity due to excess calories with serious comorbidity and body mass index (BMI) of 34.0 to 34.9 in adult 06/10/2021   Cirrhosis of liver without ascites ([place]) 06/15/2021   Dysfunction of both eustachian tubes 07/08/2021   Eye discharge 08/11/2021   Photophobia of both eyes 08/11/2021   Eye pain, bilateral 08/11/2021   Diarrhea 09/05/2021   Abdominal distention 09/05/2021   Generalized abdominal pain 09/05/2021  Resolved Ambulatory Problems    Diagnosis Date Noted   Essential hypertension, benign 05/19/2016   Community acquired pneumonia of left lower lobe of lung 11/20/2016   Impacted cerumen of right ear 01/16/2019   Past Medical History:  Diagnosis Date   Arthritis    Hypertension     Review of Systems See HPI.     Objective:   Physical  Exam Vitals reviewed.  Constitutional:      Appearance: He is ill-appearing.     Interventions: He is not intubated.    Comments: Labored breathing and pale  HENT:     Head: Normocephalic.     Mouth/Throat:     Mouth: Mucous membranes are moist.  Cardiovascular:     Rate and Rhythm: Regular rhythm. Tachycardia present.  Pulmonary:     Effort: No accessory muscle usage or respiratory distress. He is not intubated.     Breath sounds: No stridor.     Comments: Dinimished breath sounds Labored breathing Abdominal:     Tenderness: There is no abdominal tenderness.     Comments: Abdomen hard and distended with hyperactive bowel sounds  Musculoskeletal:     Right lower leg: No edema.     Left lower leg: No edema.  Neurological:     General: No focal deficit present.     Mental Status: He is alert.  Psychiatric:        Mood and Affect: Mood is anxious.          Assessment & Plan:  [name] KitchenMarland KitchenJarold was seen today for hypertension and shortness of breath.  Diagnoses and all orders for this visit:  Shortness of breath -     COMPLETE METABOLIC PANEL WITH GFR -     Lipase -     CT Abdomen Pelvis Wo Contrast; Future  Aortic atherosclerosis (HCC)  Cardiomyopathy, unspecified type (HCC) -     COMPLETE METABOLIC PANEL WITH GFR -     Lipase -     CT Abdomen Pelvis Wo Contrast; Future  Chronic systolic congestive heart failure, NYHA class 3 (HCC)  OSA on CPAP  Diarrhea, unspecified type -     COMPLETE METABOLIC PANEL WITH GFR -     Lipase -     CT Abdomen Pelvis Wo Contrast; Future -     Salmonella/Shigella Cult, Campy EIA and Shiga Toxin reflex -     Clostridium difficile culture-fecal  Generalized abdominal pain  Abdominal distention  Essential hypertension -     chlorthalidone (HYGROTON) 25 MG tablet; Take 1 tablet (25 mg total) by mouth daily.  Pt is in the 60s of 3L of O2 but still with labored breathing DDx bowel obstruction vs mass vs pancreatitis vs infection   With no evidence of edema and low BNP I don't think this is caused by CHF exacerbation I did discuss lowering his salt intake and staying on 25mg  of chlorthalidone [facility] will make referral Keep 50mg  of losartan Will get stat CT abdomen Get cbc, cmp, lipase Stool cultures ordered for diarrhea  Stat CT showed small bowel obstruction sent to ED.

## 2021-09-05 NOTE — Progress Notes (Signed)
Small bowel obstruction needs to go to ED.

## 2021-09-06 LAB — COMPLETE METABOLIC PANEL WITH GFR
AG Ratio: 1.8 (calc) (ref 1.0–2.5)
ALT: 50 U/L — ABNORMAL HIGH (ref 9–46)
AST: 52 U/L — ABNORMAL HIGH (ref 10–35)
Albumin: 5 g/dL (ref 3.6–5.1)
Alkaline phosphatase (APISO): 52 U/L (ref 35–144)
BUN/Creatinine Ratio: 24 (calc) — ABNORMAL HIGH (ref 6–22)
BUN: 42 mg/dL — ABNORMAL HIGH (ref 7–25)
CO2: 29 mmol/L (ref 20–32)
Calcium: 10.2 mg/dL (ref 8.6–10.3)
Chloride: 92 mmol/L — ABNORMAL LOW (ref 98–110)
Creat: 1.73 mg/dL — ABNORMAL HIGH (ref 0.70–1.35)
Globulin: 2.8 g/dL (calc) (ref 1.9–3.7)
Glucose, Bld: 94 mg/dL (ref 65–99)
Potassium: 3.5 mmol/L (ref 3.5–5.3)
Sodium: 136 mmol/L (ref 135–146)
Total Bilirubin: 0.8 mg/dL (ref 0.2–1.2)
Total Protein: 7.8 g/dL (ref 6.1–8.1)
eGFR: 45 mL/min/{1.73_m2} — ABNORMAL LOW (ref >=60)

## 2021-09-06 LAB — LIPASE: Lipase: 196 U/L — ABNORMAL HIGH (ref 7–60)

## 2021-09-09 DIAGNOSIS — K56609 Unspecified intestinal obstruction, unspecified as to partial versus complete obstruction: Secondary | ICD-10-CM | POA: Diagnosis not present

## 2021-09-12 ENCOUNTER — Other Ambulatory Visit: Payer: Self-pay

## 2021-09-12 ENCOUNTER — Ambulatory Visit (INDEPENDENT_AMBULATORY_CARE_PROVIDER_SITE_OTHER): Payer: Medicare HMO | Admitting: Physician Assistant

## 2021-09-12 ENCOUNTER — Encounter: Payer: Self-pay | Admitting: Physician Assistant

## 2021-09-12 VITALS — BP 121/65 | HR 65 | Ht 71.0 in | Wt 240.0 lb

## 2021-09-12 DIAGNOSIS — K56609 Unspecified intestinal obstruction, unspecified as to partial versus complete obstruction: Secondary | ICD-10-CM | POA: Diagnosis not present

## 2021-09-12 DIAGNOSIS — E876 Hypokalemia: Secondary | ICD-10-CM

## 2021-09-12 DIAGNOSIS — Z8719 Personal history of other diseases of the digestive system: Secondary | ICD-10-CM

## 2021-09-12 DIAGNOSIS — U071 COVID-19: Secondary | ICD-10-CM

## 2021-09-12 DIAGNOSIS — I5022 Chronic systolic (congestive) heart failure: Secondary | ICD-10-CM

## 2021-09-12 DIAGNOSIS — I429 Cardiomyopathy, unspecified: Secondary | ICD-10-CM | POA: Diagnosis not present

## 2021-09-12 DIAGNOSIS — I10 Essential (primary) hypertension: Secondary | ICD-10-CM

## 2021-09-12 MED ORDER — SODIUM BICARBONATE 650 MG PO TABS: 650.0000 mg | ORAL_TABLET | Freq: Four times a day (QID) | ORAL | 1 refills | Status: DC

## 2021-09-12 NOTE — Patient Instructions (Addendum)
Stop bentyl.  Use incentive spirometry multiple times a day  Referral to GI and cardiology made.

## 2021-09-12 NOTE — Progress Notes (Signed)
Subjective:    Patient ID: Jake [name], male    DOB: [birth year], 61 y.o.   MRN: [id number]  HPI Pt is a 61 yo male who presents to the clinic to follow up from hospital admission for SBO and covid infection from 12/30 to 09/10/2021.   .. 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[place]) 05/27/2017   History of pancreatitis 05/27/2017   Diverticulitis 05/27/2017   Elevated fasting glucose 06/10/2017   Low testosterone 06/10/2017   Benign prostatic hyperplasia (BPH) with urinary urgency 06/12/2017   No energy 06/12/2017   Former smoker 07/25/2017   Cough 07/25/2017   Low testosterone in male 07/25/2017   Non-restorative sleep 07/25/2017   Anxiety 10/30/2015   Chronic nausea 10/30/2015   Major depressive disorder 10/30/2015   Migraine 10/30/2015   Neck pain 10/30/2015   Occipital neuralgia 10/30/2015   Substance abuse in remission ([place]) 04/07/2018   Bipolar disorder ([place]) 07/04/2018   Hearing difficulty of right ear 01/16/2019   IFG (impaired fasting glucose) 03/20/2019   Coronary artery disease due to calcified coronary lesion 11/03/2019   Thoracic aorta atherosclerosis ([place]) 11/03/2019   Aortic atherosclerosis ([place]) 11/03/2019   Hepatic steatosis 11/03/2019   SOB (shortness of breath) on exertion 11/06/2019   Precordial pain 11/06/2019   Essential hypertension 11/06/2019   Obesity (BMI 30-39.9) 11/06/2019   Aneurysm of ascending aorta 11/10/2019   Hypoxia 02/21/2020   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/2020    Sinobronchitis 07/10/2020   Decreased cardiac ejection fraction 11/14/2020   Cardiomyopathy ([place]) 11/14/2020   Dyspnea 11/14/2020   Left lower quadrant abdominal pain 17/49/4496   Chronic systolic congestive heart failure, NYHA class 3 ([place]) 05/30/2021   IPF (idiopathic pulmonary fibrosis) ([place]) 06/10/2021   Pre-diabetes 06/10/2021   Class 1 obesity due to excess calories with serious comorbidity and body mass index (BMI) of 34.0 to 34.9 in adult 06/10/2021   Cirrhosis of liver without ascites ([place]) 06/15/2021   Dysfunction of both eustachian tubes 07/08/2021   Eye discharge 08/11/2021   Photophobia of both eyes 08/11/2021   Eye pain, bilateral 08/11/2021   Diarrhea 09/05/2021   Abdominal distention 09/05/2021   Generalized abdominal pain 09/05/2021   Resolved Ambulatory Problems    Diagnosis Date Noted   Essential hypertension, benign 05/19/2016   Community acquired pneumonia of left lower lobe of lung 11/20/2016   Impacted cerumen of right ear 01/16/2019   Past Medical History:  Diagnosis Date   Arthritis    Hypertension      12/30 Assessment Plan  ?SBO Management per Primary Patient reports increased abdominal pain- did relay concern to Dr. Pauletta [name] as patient had some questions for Primary ?enteritis relared to COVI  Cardiomyopathy # Chronic systolic HFrEF # Chronic dyspnea on exertion Stable; no overt volume overload and he's on his baseline supplemental O2; instructed to notify RN if worsening SOB or CP  AKI on CKD stage IIIb Creatinine has improved with gentle IVFs- continue to trend and avoid nephrotoxic agents  Hypokalemia Monitor and replace lytes  Fluids, electrolytes,  nutrition Fluids: , monitor lytes and replete prn, NPO Effective Now Except: Meds, May have ice chips Percent Meals Eaten (%): 50 %   Determined the ?SBO was not complete and likely due to Covid infections and scar tissue. He is doing better with abdominal pain. He is having loose watery stools  about once every 2 days. Some nausea but no vomiting. No fever. His breathing is not better. Continues to be on 3L of O2.    Review of Systems See HPI.     Objective:   Physical Exam Vitals reviewed.  Constitutional:      Appearance: Normal appearance. He is obese.  HENT:     Head: Normocephalic.  Neck:     Vascular: No carotid bruit.  Cardiovascular:     Rate and Rhythm: Normal rate and regular rhythm.     Pulses: Normal pulses.     Heart sounds: Normal heart sounds.  Pulmonary:     Breath sounds: No wheezing, rhonchi or rales.     Comments: Slightly labored breathing on 3L of O2.  Chest:     Chest wall: No tenderness.  Abdominal:     Palpations: There is no mass.     Tenderness: There is no right CVA tenderness, left CVA tenderness, guarding or rebound.     Hernia: No hernia is present.     Comments: Somewhat hard abdomen with bowel sounds in all 4 quadrants more noted upper quadrants.   Musculoskeletal:     Right lower leg: No edema.     Left lower leg: No edema.  Neurological:     General: No focal deficit present.     Mental Status: He is alert and oriented to person, place, and time.  Psychiatric:        Mood and Affect: Mood normal.          Assessment & Plan:  [name] KitchenMarland KitchenLeovardo was seen today for follow-up.  Diagnoses and all orders for this visit:  SBO (small bowel obstruction) ([place]) -     BASIC METABOLIC PANEL WITH GFR -     Magnesium -     Ambulatory referral to Gastroenterology  COVID-19 virus infection  Hypokalemia -     BASIC METABOLIC PANEL WITH GFR  Cardiomyopathy, unspecified type ([place]) -     Ambulatory referral to Cardiology  Chronic systolic congestive heart failure, NYHA class 3 ([place]) -     Ambulatory referral to Cardiology  Essential hypertension -     Ambulatory referral to Cardiology  History of diverticulitis -     Ambulatory referral to Gastroenterology  Other orders -     sodium bicarbonate 650 MG tablet; Take 1 tablet (650 mg  total) by mouth 4 (four) times daily.   Vitals look great today.  Recheck BMP and magnesium today Pt is having loose bowel movements and bowel sounds heard in all quadrants Referral for GI made for follow up and need for colonoscopy ? Probiotics Stop bentyl for now  CHF-referral for cardiology made. Pt would like to stay in [place].

## 2021-09-13 ENCOUNTER — Telehealth: Payer: Self-pay

## 2021-09-13 LAB — BASIC METABOLIC PANEL WITH GFR
BUN: 22 mg/dL (ref 7–25)
CO2: 37 mmol/L — ABNORMAL HIGH (ref 20–32)
Calcium: 9.9 mg/dL (ref 8.6–10.3)
Chloride: 96 mmol/L — ABNORMAL LOW (ref 98–110)
Creat: 1.21 mg/dL (ref 0.70–1.35)
Glucose, Bld: 86 mg/dL (ref 65–99)
Potassium: 4.2 mmol/L (ref 3.5–5.3)
Sodium: 141 mmol/L (ref 135–146)
eGFR: 69 mL/min/{1.73_m2} (ref >=60)

## 2021-09-13 LAB — MAGNESIUM: Magnesium: 2.2 mg/dL (ref 1.5–2.5)

## 2021-09-13 NOTE — Telephone Encounter (Signed)
Medication: Pitavastatin Calcium 4 MG TABS  Prior authorization submitted via CoverMyMeds on 09/13/2021 [place] submission pending

## 2021-09-15 NOTE — Progress Notes (Signed)
Labs improving.  Kidney function is much better.  Electrolytes look good.

## 2021-09-15 NOTE — Telephone Encounter (Signed)
Medication: Pitavastatin Calcium 4 MG TABS  Prior authorization determination received Medication has been approved Approval dates: 09/07/2021-09/06/2022  Patient aware via: [place] aware: Yes Provider aware via this encounter

## 2021-09-19 ENCOUNTER — Other Ambulatory Visit: Payer: Self-pay | Admitting: Physician Assistant

## 2021-09-19 DIAGNOSIS — N401 Enlarged prostate with lower urinary tract symptoms: Secondary | ICD-10-CM

## 2021-09-19 DIAGNOSIS — K580 Irritable bowel syndrome with diarrhea: Secondary | ICD-10-CM

## 2021-09-19 DIAGNOSIS — M545 Low back pain, unspecified: Secondary | ICD-10-CM

## 2021-09-19 DIAGNOSIS — M62830 Muscle spasm of back: Secondary | ICD-10-CM

## 2021-09-19 NOTE — Progress Notes (Signed)
HPI: Follow-up nonischemic cardiomyopathy.  Previously followed by Dr. Harriet Masson and at [facility].  This is my first time seeing patient.  Nuclear study March 2021 showed ejection fraction 54% and normal perfusion.  CPX March 2022 showed submaximal effort, moderate functional impairment due to pulmonary restriction.  High resolution chest CT March 2022 showed interstitial lung disease and aortic atherosclerosis; enlarged pulmonary trunk suggestive of pulmonary hypertension.  Cardiac catheterization March 2022 showed normal left main, minimal irregularities in the LAD, 2 small diagonals, circumflex, 2 obtuse marginals, posterior lateral, PDA and nondominant right coronary artery; ejection fraction 30 to 35% with mild left ventricular enlargement.  Chest CT December 2022 showed cardiomegaly, coronary calcification, aortic root measuring 4.2 cm, 4.1 cm ascending aorta.  Echocardiogram December 2022 showed ejection fraction 40 to 45%, mild left atrial enlargement, mild mitral regurgitation.  Since last seen patient describes dyspnea on exertion and fatigue.  Question orthopnea.  He denies pedal edema.  He does have chest pressure intermittently.  He denies syncope.  Current Outpatient Medications  Medication Sig Dispense Refill   albuterol (VENTOLIN HFA) 108 (90 Base) MCG/ACT inhaler INHALE 2 PUFFS BY MOUTH EVERY 6 HOURS AS NEEDED 18 g 0   AMBULATORY NON FORMULARY MEDICATION Continuous positive airway pressure (CPAP) machine set at autotitration of H2O pressure, with all supplemental supplies as needed. 1 each 0   chlorthalidone (HYGROTON) 25 MG tablet Take 1 tablet (25 mg total) by mouth daily. 90 tablet 1   colestipol (COLESTID) 1 g tablet Take 1 tablet by mouth once daily 90 tablet 3   dicyclomine (BENTYL) 20 MG tablet TAKE 1 TABLET BY MOUTH THREE TIMES DAILY 270 tablet 0   ENTRESTO 49-51 MG Take 1 tablet by mouth 2 (two) times daily.     finasteride (PROSCAR) 5 MG tablet Take 1 tablet by mouth once daily  90 tablet 0   ibuprofen (ADVIL) 800 MG tablet TAKE 1 TABLET BY MOUTH EVERY 8 HOURS AS NEEDED 90 tablet 0   loratadine (CLARITIN) 10 MG tablet Take 10 mg by mouth daily.     Multiple Vitamin (MULTIVITAMIN) tablet Take 1 tablet by mouth daily.      OLANZapine (ZYPREXA) 2.5 MG tablet Take 1 tab at bedtime for 3 weeks, then take 1/2 tablet for 2 weeks, then stop. 30 tablet 0   Omega-3 Fatty Acids (FISH OIL) 1200 MG CAPS Take 1,200 mg by mouth daily.     ondansetron (ZOFRAN) 8 MG tablet TAKE 1 TABLET BY MOUTH EVERY 8 HOURS AS NEEDED FOR NAUSEA FOR VOMITING 90 tablet 2   pantoprazole (PROTONIX) 40 MG tablet Take 1 tablet by mouth twice daily 180 tablet 1   sertraline (ZOLOFT) 100 MG tablet TAKE 1 & 1/2 (ONE & ONE-HALF) TABLETS BY MOUTH ONCE DAILY 135 tablet 0   Sod Picosulfate-Mag Ox-Cit Acd (CLENPIQ) 10-3.5-12 MG-GM -GM/160ML SOLN Take 1 kit by mouth as directed. 320 mL 0   sodium bicarbonate 650 MG tablet Take 1 tablet (650 mg total) by mouth 4 (four) times daily. 360 tablet 1   ZYPITAMAG 2 MG TABS TAKE 1 TABLET BY MOUTH DAILY. 30 tablet 5   No current facility-administered medications for this visit.     Past Medical History:  Diagnosis Date   Arthritis    lower back   Bipolar disorder ([place])    GERD (gastroesophageal reflux disease)    Hyperlipidemia    Hypertension    Substance abuse in remission ([place]) 04/06/2018   AVOID OPIATES!  Past Surgical History:  °Procedure Laterality Date  ° ANKLE FRACTURE SURGERY Left   ° APPENDECTOMY    ° CERVICAL FUSION    ° CHOLECYSTECTOMY    ° COLON SURGERY    ° HERNIA REPAIR    ° ° °Social History  ° °Socioeconomic History  ° Marital status: Married  °  Spouse name: Dawn  ° Number of children: 1  ° Years of education: 14  ° Highest education level: Not on file  °Occupational History  ° Occupation: Disabled  °Tobacco Use  ° Smoking status: Former  °  Packs/day: 2.00  °  Years: 25.00  °  Pack years: 50.00  °  Types: Cigarettes  °  Start date: 1982  °  Quit  date: 2010  °  Years since quitting: 13.0  ° Smokeless tobacco: Never  °Vaping Use  ° Vaping Use: Never used  °Substance and Sexual Activity  ° Alcohol use: No  ° Drug use: No  °  Comment: 04/06/2018 Patient's wife wanted us to be very aware that her husband is a recovering narcotic abuser  ° Sexual activity: Yes  °Other Topics Concern  ° Not on file  °Social History Narrative  ° Lives with his wife. Likes to spend to time with grandchildren. Does not exercise due to back issues in the past and respiratory problems. Daily caffeine use 2-3 drinks a day.   ° °Social Determinants of Health  ° °Financial Resource Strain: Low Risk   ° Difficulty of Paying Living Expenses: Not hard at all  °Food Insecurity: No Food Insecurity  ° Worried About Running Out of Food in the Last Year: Never true  ° Ran Out of Food in the Last Year: Never true  °Transportation Needs: No Transportation Needs  ° Lack of Transportation (Medical): No  ° Lack of Transportation (Non-Medical): No  °Physical Activity: Inactive  ° Days of Exercise per Week: 0 days  ° Minutes of Exercise per Session: 0 min  °Stress: No Stress Concern Present  ° Feeling of Stress : Not at all  °Social Connections: Socially Integrated  ° Frequency of Communication with Friends and Family: Twice a week  ° Frequency of Social Gatherings with Friends and Family: Three times a week  ° Attends Religious Services: More than 4 times per year  ° Active Member of Clubs or Organizations: Yes  ° Attends Club or Organization Meetings: More than 4 times per year  ° Marital Status: Married  °Intimate Partner Violence: Not At Risk  ° Fear of Current or Ex-Partner: No  ° Emotionally Abused: No  ° Physically Abused: No  ° Sexually Abused: No  ° ° °Family History  °Problem Relation Age of Onset  ° Cancer Mother   °     breast  ° Depression Mother   ° Hyperlipidemia Mother   ° Dementia Mother   ° Alcohol abuse Father   ° Cancer Father   °     skin  ° Hyperlipidemia Father   ° Alzheimer's  disease Father   ° Cancer Maternal Grandfather   ° Cancer Paternal Grandmother   ° Pancreatic cancer Paternal Uncle   ° Colon cancer Neg Hx   ° Esophageal cancer Neg Hx   ° Stomach cancer Neg Hx   ° ° °ROS: no fevers or chills, productive cough, hemoptysis, dysphasia, odynophagia, melena, hematochezia, dysuria, hematuria, rash, seizure activity, orthopnea, PND, pedal edema, claudication. Remaining systems are negative. ° °Physical Exam: °Well-developed well-nourished in no acute distress.  °  Skin is warm and dry.  HEENT is normal.  Neck is supple.  Chest is clear to auscultation with normal expansion.  Cardiovascular exam is regular rate and rhythm.  Abdominal exam nontender or distended. No masses palpated. Extremities show no edema. neuro grossly intact  ECG-normal sinus rhythm at a rate of 80, left bundle branch block.  Left bundle branch block noted on electrocardiogram September 03, 2021.  Personally reviewed  A/P  1 nonischemic cardiomyopathy-continue Entresto; resume Toprol 50 mg daily.  2 chronic combined systolic/diastolic congestive heart failure-he is euvolemic on examination.  Continue chlorthalidone.  We discussed [facility] [place] but he would prefer to defer at this point.  Will check BNP.  Previous CPX suggested limitation related to pulmonary restriction.  He continues to complain of dyspnea and states the pulmonologist have told him its related to his heart.  Not clear to me that that is the case.  I will see him back in 8 weeks after he follows up with pulmonary.  If there continues to be a question of this we will schedule right heart catheterization.  3 thoracic aortic aneurysm-follow-up CTA December 2023.  4 coronary calcification-he is intolerant to statins.  We will consider other medications later when he has follow-up lipids drawn.  5 hypertension-blood pressure controlled.  Continue present medications and follow.  6 hyperlipidemia-intolerant to statins.  As above we will  consider other medications later when follow-up lipids are drawn.  7 obstructive sleep apnea-continue CPAP.  Kirk Ruths, MD

## 2021-09-22 ENCOUNTER — Other Ambulatory Visit: Payer: Self-pay

## 2021-09-22 ENCOUNTER — Ambulatory Visit: Payer: Medicare HMO | Admitting: Gastroenterology

## 2021-09-22 ENCOUNTER — Encounter: Payer: Self-pay | Admitting: Gastroenterology

## 2021-09-22 VITALS — BP 112/78 | HR 90 | Ht 71.0 in | Wt 236.0 lb

## 2021-09-22 DIAGNOSIS — R1013 Epigastric pain: Secondary | ICD-10-CM

## 2021-09-22 DIAGNOSIS — I2510 Atherosclerotic heart disease of native coronary artery without angina pectoris: Secondary | ICD-10-CM | POA: Diagnosis not present

## 2021-09-22 DIAGNOSIS — K921 Melena: Secondary | ICD-10-CM

## 2021-09-22 DIAGNOSIS — I509 Heart failure, unspecified: Secondary | ICD-10-CM

## 2021-09-22 DIAGNOSIS — I5022 Chronic systolic (congestive) heart failure: Secondary | ICD-10-CM

## 2021-09-22 DIAGNOSIS — Z1211 Encounter for screening for malignant neoplasm of colon: Secondary | ICD-10-CM | POA: Diagnosis not present

## 2021-09-22 DIAGNOSIS — G4733 Obstructive sleep apnea (adult) (pediatric): Secondary | ICD-10-CM | POA: Diagnosis not present

## 2021-09-22 DIAGNOSIS — J984 Other disorders of lung: Secondary | ICD-10-CM

## 2021-09-22 DIAGNOSIS — K219 Gastro-esophageal reflux disease without esophagitis: Secondary | ICD-10-CM

## 2021-09-22 DIAGNOSIS — R194 Change in bowel habit: Secondary | ICD-10-CM | POA: Diagnosis not present

## 2021-09-22 DIAGNOSIS — R197 Diarrhea, unspecified: Secondary | ICD-10-CM

## 2021-09-22 DIAGNOSIS — G8929 Other chronic pain: Secondary | ICD-10-CM

## 2021-09-22 DIAGNOSIS — I2584 Coronary atherosclerosis due to calcified coronary lesion: Secondary | ICD-10-CM

## 2021-09-22 NOTE — Patient Instructions (Signed)
If you are age 62 or older, your body mass index should be between 23-30. Your Body mass index is 32.92 kg/m. If this is out of the aforementioned range listed, please consider follow up with your Primary Care Provider.  If you are age 75 or younger, your body mass index should be between 19-25. Your Body mass index is 32.92 kg/m. If this is out of the aformentioned range listed, please consider follow up with your Primary Care Provider.   __________________________________________________________  The [place] [facility] providers would like to encourage you to use [facility] to communicate with providers for non-urgent requests or questions.  Due to long hold times on the telephone, sending your provider a message by [facility] may be a faster and more efficient way to get a response.  Please allow 48 business hours for a response.  Please remember that this is for non-urgent requests.   We will schedule you for an endoscopy and colonoscopy at [facility] when we receive cardiac and pulmonary clearance.  Due to recent changes in healthcare laws, you may see the results of your imaging and laboratory studies on MyChart before your provider has had a chance to review them.  We understand that in some cases there may be results that are confusing or concerning to you. Not all laboratory results come back in the same time frame and the provider may be waiting for multiple results in order to interpret others.  Please give [place] 48 hours in order for your provider to thoroughly review all the results before contacting the office for clarification of your results.    Thank you for choosing me and [facility].  Vito Cirigliano, D.O.

## 2021-09-22 NOTE — Progress Notes (Signed)
Chief Complaint: Abdominal pain, colon cancer screening, nausea, GERD  Referring Provider:     Donella Stade, PA-C   HPI:     Jake [name] is a 61 y.o. male with a history of CHF (EF 35% increased to 40% with Entresto), prescribed home O2, CAD, diverticulosis with prior diverticulitis and segmental resection, HTN, HLD, OSA (on CPAP), IPF, GERD, GAD, former smoker, migraines, bipolar, CKD 3b, history of substance abuse (in remission), ccy, referred to the [facility] for evaluation of abdominal pain, hospital follow-up.  Pain started abruptly on 08/26/2021, described as mid abdominal pain with a cramping quality and associated nausea and diarrhea.  Pain was constant and not changed with p.o. intake.  No prior similar symptoms.  - 09/03/2021: ER evaluation CTA negative for PE.  EKG unchanged.  WBC 8.6.  Started on Lasix 40 mg/day for CHF - 09/05/2021: CT A/P: Hepatic steatosis, ccy, multiple dilated loops of small bowel measuring up to 5.2 cm c/w SBO with relative transition point in right mid abdomen, diverticulosis - 09/05/2021: Admitted to [facility] with SBO.  Normal PT/INR, CBC.  COVID-positive (no antiviral therapy required).  AKI on CKD, NA 132, K3.0.  Managed with NGT, n.p.o., bowel rest, surgical consult.  Discharged 09/09/2021 - 09/12/2021: Follow-up with PCM.  BMP back to baseline  Overall, pain improving since onset on 12/28. Stools improving. Has restarted Metamucil and slowly liberalizing from BRAT type.  Did notice 1 episode of scant BRBPR last week, otherwise has been without hematochezia, melena.  Nausea improving.  Has been >10 years since last colonoscopy. Has had a few EGDs over the years, but also >10 years ago since last one.   Hx of partial colectomy for diverticulitis 15 years ago.  Has had a few episodes of diverticulitis since then, always managed with outpatient p.o. ABX.  GERD well controlled with Protonix 40 mg PO BID. No dysphagia.     Past Medical History:  Diagnosis Date   Arthritis    lower back   Bipolar disorder ([place])    GERD (gastroesophageal reflux disease)    Hyperlipidemia    Hypertension    Substance abuse in remission ([place]) 04/06/2018   AVOID OPIATES!     Past Surgical History:  Procedure Laterality Date   ANKLE FRACTURE SURGERY Left    APPENDECTOMY     CERVICAL FUSION     CHOLECYSTECTOMY     COLON SURGERY     HERNIA REPAIR     Family History  Problem Relation Age of Onset   Cancer Mother        breast   Depression Mother    Hyperlipidemia Mother    Dementia Mother    Alcohol abuse Father    Cancer Father        skin   Hyperlipidemia Father    Alzheimer's disease Father    Cancer Maternal Grandfather    Cancer Paternal Grandmother    Pancreatic cancer Paternal Uncle    Colon cancer Neg Hx    Esophageal cancer Neg Hx    Stomach cancer Neg Hx    Social History   Tobacco Use   Smoking status: Former    Packs/day: 2.00    Years: 25.00    Pack years: 50.00    Types: Cigarettes    Start date: 57    Quit date: 2010    Years since quitting: 13.0   Smokeless tobacco: Never  Vaping Use  Vaping Use: Never used  Substance Use Topics   Alcohol use: No   Drug use: No    Comment: 04/06/2018 Patient's wife wanted [place] to be very aware that her husband is a recovering narcotic abuser   Current Outpatient Medications  Medication Sig Dispense Refill   albuterol (VENTOLIN HFA) 108 (90 Base) MCG/ACT inhaler INHALE 2 PUFFS BY MOUTH EVERY 6 HOURS AS NEEDED 18 g 0   AMBULATORY NON FORMULARY MEDICATION Continuous positive airway pressure (CPAP) machine set at autotitration of H2O pressure, with all supplemental supplies as needed. 1 each 0   azelastine (ASTELIN) 0.1 % nasal spray Place 2 sprays into both nostrils 2 (two) times daily. Use in each nostril as directed 30 mL 0   chlorthalidone (HYGROTON) 25 MG tablet Take 1 tablet (25 mg total) by mouth daily. 90 tablet 1   colestipol  (COLESTID) 1 g tablet Take 1 tablet by mouth once daily 90 tablet 3   dicyclomine (BENTYL) 20 MG tablet TAKE 1 TABLET BY MOUTH THREE TIMES DAILY 270 tablet 0   ENTRESTO 49-51 MG Take 1 tablet by mouth 2 (two) times daily.     finasteride (PROSCAR) 5 MG tablet Take 1 tablet by mouth once daily 90 tablet 0   ibuprofen (ADVIL) 800 MG tablet TAKE 1 TABLET BY MOUTH EVERY 8 HOURS AS NEEDED 90 tablet 0   loratadine (CLARITIN) 10 MG tablet Take 10 mg by mouth daily.     Multiple Vitamin (MULTIVITAMIN) tablet Take 1 tablet by mouth daily.      OLANZapine (ZYPREXA) 2.5 MG tablet Take 1 tab at bedtime for 3 weeks, then take 1/2 tablet for 2 weeks, then stop. 30 tablet 0   Omega-3 Fatty Acids (FISH OIL) 1200 MG CAPS Take 1,200 mg by mouth daily.     ondansetron (ZOFRAN) 8 MG tablet TAKE 1 TABLET BY MOUTH EVERY 8 HOURS AS NEEDED FOR NAUSEA FOR VOMITING 90 tablet 2   pantoprazole (PROTONIX) 40 MG tablet Take 1 tablet by mouth twice daily 180 tablet 1   sertraline (ZOLOFT) 100 MG tablet TAKE 1 & 1/2 (ONE & ONE-HALF) TABLETS BY MOUTH ONCE DAILY 135 tablet 0   sodium bicarbonate 650 MG tablet Take 1 tablet (650 mg total) by mouth 4 (four) times daily. 360 tablet 1   ZYPITAMAG 2 MG TABS TAKE 1 TABLET BY MOUTH DAILY. 30 tablet 5   No current facility-administered medications for this visit.   Allergies  Allergen Reactions   Fentanyl Other (See Comments)    Avoid opiates Other reaction(s): Other Avoid opiates   Other     04/06/18 Patient is a recovering narcotic abuser and, per wife, is NEVER to receive controlled substances (e.g. Narcotics, sedatives, etc.) Other reaction(s): Unknown 04/06/18 Patient is a recovering narcotic abuser and, per wife, is NEVER to receive controlled substances (e.g. Narcotics, sedatives, etc.)   Aspirin Other (See Comments)    Per pt Other reaction(s): Unknown Per pt   Atorvastatin Nausea And Vomiting    GI side effects.  GI side effects.    Prednisone     Can not tolerate  oral but can tolerate shots.  Other reaction(s): Agitation Can not tolerate oral but can tolerate shots.    Contrast Media [Iodinated Contrast Media] Palpitations   Nitroglycerin Nausea And Vomiting     Review of Systems: All systems reviewed and negative except where noted in HPI.     Physical Exam:    Wt Readings from Last 3 Encounters:  09/12/21  240 lb (108.9 kg)  09/05/21 238 lb (108 kg)  09/03/21 240 lb (108.9 kg)    There were no vitals taken for this visit. Constitutional:  Pleasant, in no acute distress. Psychiatric: Normal mood and affect. Behavior is normal. Cardiovascular: Normal rate, regular rhythm. No edema Pulmonary/chest: Effort normal and breath sounds normal. No wheezing, rales or rhonchi.  Nasal cannula in place Abdominal: Minimal TTP in mid abdomen.  No rebound or guarding.  No peritoneal signs.  Soft, nondistended. Bowel sounds active throughout. There are no masses palpable. No hepatomegaly. Neurological: Alert and oriented to person place and time. Skin: Skin is warm and dry. No rashes noted.   ASSESSMENT AND PLAN;   1) Diarrhea/Change in bowel habits 2) Mid abdominal pain 3) Small Bowel Obstruction on CT - SBO symptoms resolved with conservative management.  Tolerating p.o. intake and having regular bowel movements - GI PCR panel - EGD/colonoscopy to evaluate for mucosal/luminal pathology  4) COVID infection - COVID incidentally diagnosed during recent hospital admission.  Did discuss the possibility of COVID induced GI symptoms.  Otherwise did not meet criteria for antiviral therapy  5) GERD - Reflux symptoms well controlled on high-dose PPI - Continue antireflux lifestyle/dietary modifications - EGD for Barrett's Esophagus screening  6) Colon cancer screening - Due for age-appropriate CRC screening - Colonoscopy as above  7) Hematochezia - Evaluate for etiology at time of colonoscopy  8) OSA on CPAP 9) CHF with EF 40-45 % 10)  LBBB 11) Restrictive lung disease on home O2 12) CAD - Procedures to be scheduled at [facility] due to elevated periprocedural risks from underlying comorbidities - Obtain Cardiology and Pulmonary clearance to proceed with EGD and colonoscopy as above  13) Diverticulosis - History of diverticulosis c/b diverticulitis requiring segmental resection 15 years ago -Evaluate at time of colonoscopy as above  The indications, risks, and benefits of EGD and colonoscopy were explained to the patient in detail. Risks include but are not limited to bleeding, perforation, adverse reaction to medications, and cardiopulmonary compromise. Sequelae include but are not limited to the possibility of surgery, hospitalization, and mortality. The patient verbalized understanding and wished to proceed. All questions answered, referred to scheduler and bowel prep ordered. Further recommendations pending results of the exam.     Lavena Bullion, DO, FACG  09/22/2021, 3:53 PM   Breeback, Royetta Car, PA-C

## 2021-09-23 ENCOUNTER — Telehealth: Payer: Self-pay | Admitting: General Surgery

## 2021-09-23 MED ORDER — CLENPIQ 10-3.5-12 MG-GM -GM/160ML PO SOLN: 1.0000 | ORAL | 0 refills | Status: DC

## 2021-09-23 NOTE — Telephone Encounter (Signed)
Spoke with the patients wife. Patient scheduled for EGD/Colon at [facility] on 11/18/2021 at 9:45. Explained to Shirlean Mylar that I will send the instructions for procedure to mychart and his rx for the prep. Expressed that we also will need to still obtain cardiac and pulmonary clearance. The patient is starting with a new cardiologist at [facility] on 09/29/2021 and would like [place] to seek clearance from them.

## 2021-09-24 DIAGNOSIS — G4733 Obstructive sleep apnea (adult) (pediatric): Secondary | ICD-10-CM | POA: Diagnosis not present

## 2021-09-25 DIAGNOSIS — R197 Diarrhea, unspecified: Secondary | ICD-10-CM | POA: Diagnosis not present

## 2021-09-25 NOTE — Telephone Encounter (Signed)
Received my letter that was sent to Lurline Hare, PA-C returned via fax stating to send to [facility] Dr Raliegh Scarlet. Typed new letter and sent to him via [employer]

## 2021-09-26 DIAGNOSIS — R0902 Hypoxemia: Secondary | ICD-10-CM | POA: Diagnosis not present

## 2021-09-26 DIAGNOSIS — R197 Diarrhea, unspecified: Secondary | ICD-10-CM | POA: Diagnosis not present

## 2021-09-26 DIAGNOSIS — I10 Essential (primary) hypertension: Secondary | ICD-10-CM | POA: Diagnosis not present

## 2021-09-28 LAB — SALMONELLA/SHIGELLA CULT, CAMPY EIA AND SHIGA TOXIN RFL ECOLI
MICRO NUMBER: 12898047
MICRO NUMBER:: 12898048
MICRO NUMBER:: 12898049
Result:: NOT DETECTED
SHIGA RESULT:: NOT DETECTED
SPECIMEN QUALITY: ADEQUATE
SPECIMEN QUALITY:: ADEQUATE
SPECIMEN QUALITY:: ADEQUATE

## 2021-09-29 ENCOUNTER — Encounter: Payer: Self-pay | Admitting: Cardiology

## 2021-09-29 ENCOUNTER — Telehealth: Payer: Self-pay | Admitting: *Deleted

## 2021-09-29 ENCOUNTER — Ambulatory Visit: Payer: Medicare HMO | Admitting: Cardiology

## 2021-09-29 ENCOUNTER — Other Ambulatory Visit: Payer: Self-pay

## 2021-09-29 VITALS — BP 131/64 | HR 84 | Ht 71.0 in | Wt 235.0 lb

## 2021-09-29 DIAGNOSIS — I10 Essential (primary) hypertension: Secondary | ICD-10-CM | POA: Diagnosis not present

## 2021-09-29 DIAGNOSIS — R0602 Shortness of breath: Secondary | ICD-10-CM | POA: Diagnosis not present

## 2021-09-29 DIAGNOSIS — I420 Dilated cardiomyopathy: Secondary | ICD-10-CM | POA: Diagnosis not present

## 2021-09-29 DIAGNOSIS — I5022 Chronic systolic (congestive) heart failure: Secondary | ICD-10-CM

## 2021-09-29 DIAGNOSIS — E782 Mixed hyperlipidemia: Secondary | ICD-10-CM

## 2021-09-29 MED ORDER — METOPROLOL SUCCINATE ER 50 MG PO TB24: 50.0000 mg | ORAL_TABLET | Freq: Every day | ORAL | 3 refills | Status: DC

## 2021-09-29 NOTE — Progress Notes (Signed)
No bacteria detected in stool samples.

## 2021-09-29 NOTE — Addendum Note (Signed)
Addended by: Patterson Hammersmith A on: 09/29/2021 03:27 PM   Modules accepted: Orders

## 2021-09-29 NOTE — Telephone Encounter (Signed)
Patient called back to the office with his current medications. He was told to continue metoprolol succ er 50 mg twice daily.

## 2021-09-29 NOTE — Patient Instructions (Signed)
Medication Instructions:   START METOPROLOL SUCC ER 50 MG ONCE DAILY  *If you need a refill on your cardiac medications before your next appointment, please call your pharmacy*   Follow-Up: At [facility], you and your health needs are our priority.  As part of our continuing mission to provide you with exceptional heart care, we have created designated Provider Care Teams.  These Care Teams include your primary Cardiologist (physician) and Advanced Practice Providers (APPs -  Physician Assistants and Nurse Practitioners) who all work together to provide you with the care you need, when you need it.  We recommend signing up for the patient portal called "MyChart".  Sign up information is provided on this After Visit Summary.  MyChart is used to connect with patients for Virtual Visits (Telemedicine).  Patients are able to view lab/test results, encounter notes, upcoming appointments, etc.  Non-urgent messages can be sent to your provider as well.   To learn more about what you can do with MyChart, go to [email or web address].    Your next appointment:   8 week(s)  The format for your next appointment:   In Person  Provider:   Kirk Ruths MD

## 2021-09-30 LAB — BRAIN NATRIURETIC PEPTIDE: Brain Natriuretic Peptide: 16 pg/mL (ref <100)

## 2021-10-02 LAB — CLOSTRIDIUM DIFFICILE CULTURE-FECAL

## 2021-10-03 ENCOUNTER — Other Ambulatory Visit: Payer: Self-pay | Admitting: Psychiatry

## 2021-10-03 NOTE — Telephone Encounter (Signed)
Please see message from pharmacy. The "midday" was cut off. I did not call pharmacy since it is controlled.

## 2021-10-03 NOTE — Telephone Encounter (Signed)
Contacted pharmacy and they asked that Rx be sent again.

## 2021-10-03 NOTE — Progress Notes (Signed)
No C diff

## 2021-10-03 NOTE — Telephone Encounter (Signed)
Left a message for pt to call and schedule

## 2021-10-03 NOTE — Telephone Encounter (Signed)
Please schedule appt

## 2021-10-03 NOTE — Telephone Encounter (Signed)
Seen 8/5 due back 10/6

## 2021-10-10 ENCOUNTER — Ambulatory Visit: Payer: Medicare HMO | Admitting: Pulmonary Disease

## 2021-10-10 ENCOUNTER — Other Ambulatory Visit: Payer: Self-pay | Admitting: Psychiatry

## 2021-10-10 DIAGNOSIS — Z9989 Dependence on other enabling machines and devices: Secondary | ICD-10-CM

## 2021-10-10 DIAGNOSIS — G4733 Obstructive sleep apnea (adult) (pediatric): Secondary | ICD-10-CM

## 2021-10-14 MED ORDER — MODAFINIL 200 MG PO TABS: 200.0000 mg | ORAL_TABLET | Freq: Every day | ORAL | 1 refills | Status: DC

## 2021-10-14 NOTE — Telephone Encounter (Signed)
Pended.

## 2021-10-14 NOTE — Telephone Encounter (Signed)
Script cancelled at [facility] since pended script indicated that it was a refill request from [place]. [facility]

## 2021-10-14 NOTE — Telephone Encounter (Signed)
Pt called and said that he would like  [place] to take off  [facility] as his prefered pharmacy. [facility] [address] [facility] [place]. The [facility] was [facility] to [facility] to [facility] failed to send

## 2021-10-16 ENCOUNTER — Other Ambulatory Visit: Payer: Self-pay | Admitting: Neurology

## 2021-10-16 MED ORDER — NIRMATRELVIR/RITONAVIR (PAXLOVID)TABLET: 3.0000 | ORAL_TABLET | Freq: Two times a day (BID) | ORAL | 0 refills | Status: AC

## 2021-10-24 ENCOUNTER — Telehealth: Payer: Self-pay | Admitting: Neurology

## 2021-10-24 MED ORDER — COLESTIPOL HCL 1 G PO TABS: 1.0000 g | ORAL_TABLET | Freq: Every day | ORAL | 1 refills | Status: DC

## 2021-10-24 NOTE — Telephone Encounter (Signed)
Patient left vm requesting refills of [facility]ol [facility] [address]. Previously at a different pharmacy. Called patient and let him know RX sent.

## 2021-10-25 DIAGNOSIS — G4733 Obstructive sleep apnea (adult) (pediatric): Secondary | ICD-10-CM | POA: Diagnosis not present

## 2021-10-27 DIAGNOSIS — I10 Essential (primary) hypertension: Secondary | ICD-10-CM | POA: Diagnosis not present

## 2021-10-27 DIAGNOSIS — R0902 Hypoxemia: Secondary | ICD-10-CM | POA: Diagnosis not present

## 2021-11-03 ENCOUNTER — Telehealth: Payer: Self-pay

## 2021-11-03 DIAGNOSIS — N401 Enlarged prostate with lower urinary tract symptoms: Secondary | ICD-10-CM

## 2021-11-03 DIAGNOSIS — F411 Generalized anxiety disorder: Secondary | ICD-10-CM

## 2021-11-03 DIAGNOSIS — M545 Low back pain, unspecified: Secondary | ICD-10-CM

## 2021-11-03 DIAGNOSIS — M62830 Muscle spasm of back: Secondary | ICD-10-CM

## 2021-11-03 MED ORDER — FINASTERIDE 5 MG PO TABS: 5.0000 mg | ORAL_TABLET | Freq: Every day | ORAL | 0 refills | Status: DC

## 2021-11-03 MED ORDER — IBUPROFEN 800 MG PO TABS: 800.0000 mg | ORAL_TABLET | Freq: Three times a day (TID) | ORAL | 0 refills | Status: DC | PRN

## 2021-11-03 MED ORDER — PANTOPRAZOLE SODIUM 40 MG PO TBEC: 40.0000 mg | DELAYED_RELEASE_TABLET | Freq: Two times a day (BID) | ORAL | 1 refills | Status: DC

## 2021-11-10 ENCOUNTER — Telehealth: Payer: Self-pay | Admitting: Gastroenterology

## 2021-11-10 ENCOUNTER — Encounter (HOSPITAL_COMMUNITY): Payer: Self-pay | Admitting: Gastroenterology

## 2021-11-10 MED ORDER — CLENPIQ 10-3.5-12 MG-GM -GM/160ML PO SOLN: 1.0000 | ORAL | 0 refills | Status: DC

## 2021-11-10 NOTE — Telephone Encounter (Signed)
Patient made aware. Script sent ?

## 2021-11-10 NOTE — Telephone Encounter (Signed)
Inbound call from patient stated that he needs his prep sent to a different pharmacy for his procedure at [facility] on 3/14. ? ?[facility] [address] [facility] [place]. ? ?Phone  number [phone]. ?

## 2021-11-11 ENCOUNTER — Telehealth: Payer: Self-pay | Admitting: Gastroenterology

## 2021-11-11 ENCOUNTER — Ambulatory Visit: Payer: Medicare HMO | Admitting: Psychiatry

## 2021-11-11 ENCOUNTER — Encounter: Payer: Self-pay | Admitting: Physician Assistant

## 2021-11-11 ENCOUNTER — Encounter: Payer: Self-pay | Admitting: Psychiatry

## 2021-11-11 ENCOUNTER — Ambulatory Visit (INDEPENDENT_AMBULATORY_CARE_PROVIDER_SITE_OTHER): Payer: Medicare HMO | Admitting: Physician Assistant

## 2021-11-11 ENCOUNTER — Other Ambulatory Visit: Payer: Self-pay

## 2021-11-11 ENCOUNTER — Other Ambulatory Visit: Payer: Self-pay | Admitting: Physician Assistant

## 2021-11-11 VITALS — BP 154/65 | HR 79 | Ht 71.0 in | Wt 235.0 lb

## 2021-11-11 DIAGNOSIS — G4733 Obstructive sleep apnea (adult) (pediatric): Secondary | ICD-10-CM | POA: Diagnosis not present

## 2021-11-11 DIAGNOSIS — D229 Melanocytic nevi, unspecified: Secondary | ICD-10-CM | POA: Diagnosis not present

## 2021-11-11 DIAGNOSIS — L820 Inflamed seborrheic keratosis: Secondary | ICD-10-CM | POA: Diagnosis not present

## 2021-11-11 DIAGNOSIS — F411 Generalized anxiety disorder: Secondary | ICD-10-CM

## 2021-11-11 DIAGNOSIS — Z9989 Dependence on other enabling machines and devices: Secondary | ICD-10-CM

## 2021-11-11 DIAGNOSIS — R69 Illness, unspecified: Secondary | ICD-10-CM | POA: Diagnosis not present

## 2021-11-11 MED ORDER — MODAFINIL 200 MG PO TABS: 200.0000 mg | ORAL_TABLET | Freq: Every day | ORAL | 5 refills | Status: DC

## 2021-11-11 MED ORDER — CYCLOBENZAPRINE HCL 10 MG PO TABS: 10.0000 mg | ORAL_TABLET | Freq: Three times a day (TID) | ORAL | 0 refills | Status: DC | PRN

## 2021-11-11 MED ORDER — SERTRALINE HCL 100 MG PO TABS: ORAL_TABLET | ORAL | 1 refills | Status: DC

## 2021-11-11 NOTE — Patient Instructions (Signed)
Skin Biopsy, Care After ?The following information offers guidance on how to care for yourself after your procedure. Your health care provider may also give you more specific instructions. If you have problems or questions, contact your health care provider. ?What can I expect after the procedure? ?After the procedure, it is common to have: ?Soreness or mild pain. ?Bruising. ?Itching. ?Some redness and swelling. ?Follow these instructions at home: ?Biopsy site care ? ?Follow instructions from your health care provider about how to take care of your biopsy site. Make sure you: ?Wash your hands with soap and water for at least 20 seconds before and after you change your bandage (dressing). If soap and water are not available, use hand sanitizer. ?Change your dressing as told by your health care provider. ?Leave stitches (sutures), skin glue, or adhesive strips in place. These skin closures may need to stay in place for 2 weeks or longer. If adhesive strip edges start to loosen and curl up, you may trim the loose edges. Do not remove adhesive strips completely unless your health care provider tells you to do that. ?Check your biopsy site every day for signs of infection. Check for: ?More redness, swelling, or pain. ?Fluid or blood. ?Warmth. ?Pus or a bad smell. ?Do not take baths, swim, or use a hot tub until your health care provider approves. Ask your health care provider if you may take showers. You may only be allowed to take sponge baths. ?General instructions ?Take over-the-counter and prescription medicines only as told by your health care provider. ?Return to your normal activities as told by your health care provider. Ask your health care provider what activities are safe for you. ?Keep all follow-up visits. This is important. ?Contact a health care provider if: ?You have more redness, swelling, or pain around your biopsy site. ?You have fluid or blood coming from your biopsy site. ?Your biopsy site feels warm  to the touch. ?You have pus or a bad smell coming from your biopsy site. ?You have a fever. ?Your sutures, skin glue, or adhesive strips loosen or come off sooner than expected. ?Get help right away if: ?You have bleeding that does not stop with pressure or a dressing. ?Summary ?After the procedure, it is common to have soreness, bruising, and itching at the site. ?Follow instructions from your health care provider about how to take care of your biopsy site. ?Check your biopsy site every day for signs of infection. ?Contact a health care provider if you have more redness, swelling, or pain around your biopsy site, or your biopsy site feels warm to the touch. ?Keep all follow-up visits. This is important. ?This information is not intended to replace advice given to you by your health care provider. Make sure you discuss any questions you have with your health care provider. ?Document Revised: 03/25/2021 Document Reviewed: 03/25/2021 ?Elsevier Patient Education ? [place]. ? ?

## 2021-11-11 NOTE — Progress Notes (Signed)
? ?  Subjective:  ? ? Patient ID: Jake [name], male    DOB: [birth year], 61 y.o.   MRN: [id number] ? ?HPI ?Pt has a growth on the left side of his head that has been growing in size daily. Noticed it about 1.5 weeks ago. No pain or itching. No hx of skin cancer. Pt has not done well with wearing sun screen over the course of his life.  ? ? ?Review of Systems  ?All other systems reviewed and are negative. ? ?   ?Objective:  ? Physical Exam ? ?Left scalp line temple 1cm by 1.5cm raised papule with rough appearance and irregular borders ? ?Shave Biopsy Procedure Note ? ?Pre-operative Diagnosis: Suspicious lesion ? ?Post-operative Diagnosis: same ? ?Locations:left temple in scalp line ? ?Indications: concern for cancer ? ?Anesthesia: Lidocaine 1% without epinephrine without added sodium bicarbonate ? ?Procedure Details  ?History of allergy to iodine: no ? ?Patient informed of the risks (including bleeding and infection) and benefits of the  ?procedure and Verbal informed consent obtained. ? ?The lesion and surrounding area were given a sterile prep using chlorhexidine and draped in the usual sterile fashion. A scalpel was used to shave an area of skin approximately 1cm by  1.5 cm.  Hemostasis achieved with surgicell hemostatic dressing. Antibiotic ointment and a sterile dressing applied.  The specimen was sent for pathologic examination. The patient tolerated the procedure well. ? ?EBL: scant ? ?Condition: ?Stable ? ?Complications: ?none. ? ?Plan: ?1. Instructed to keep the wound dry and covered for 24-48h and clean thereafter. ?2. Warning signs of infection were reviewed.   ?3. Recommended that the patient use OTC acetaminophen as needed for pain.  ? ?   ?Assessment & Plan:  ?..[place] was seen today for skin problem. ? ?Diagnoses and all orders for this visit: ? ?Suspicious nevus ?-     Surgical pathology ? ?Other orders ?-     cyclobenzaprine (FLEXERIL) 10 MG tablet; Take 1 tablet (10 mg total) by mouth 3 (three)  times daily as needed for muscle spasms. ? ? ?Biopsy done today.  ?HO given ?Follow up as needed ? ?

## 2021-11-11 NOTE — Progress Notes (Signed)
Jake [name] [id number] 07-Feb-1961 61 y.o.  Subjective:   Patient ID:  Jake [name] is a 61 y.o. (DOB 12/14/1960) male.  Chief Complaint:  Chief Complaint  Patient presents with   Follow-up    Anxiety, depression, excessive somnolence    HPI Jake [name] presents to the office today for follow-up of anxiety, depression, and excessive daytime somnolence. He is accompanied by his wife. He reports that he notices most improvement if he takes an entire tab in the morning. He reports improved energy and slight improvement in concentration. He reports that he still feels tired and that oxygen supplementation and modafinil. He reports that he did not have difficulty coming off olanzapine.   He reports that he has had "some" anxiety, "but not uncontrollable." Denies any recent full blown panic attacks. He reports that he has had anxiety at times when he has been short of breath. He reports constant worry. He reports that catastrophic thinking and rumination is less compared to the past.   He reports that he has had "some" depression and this is manageable. He reports mild irritability at times. He reports that he has been sleeping without difficulty. Wearing cPap nightly. He reports improved motivation. Appetite has been ok. No longer eating late at night. He has been drinking more water. Denies anhedonia. Denies SI.   Did not qualify for [facility].   They just moved. They moved in with their daughter and grandchildren that are almost 53 yo and 32 yo. Wife had COVID when they planned their move.   Past Psychiatric Medication Trials: Latuda- most effective and had jaw clinching/thrusting. Olanzapine Risperdal Seroquel Abilify Lithium Carbamazepine Topamax Effexor Sertraline Wellbutrin- not effective  AIMS    Flowsheet Row Office Visit from 03/19/2021 in [place] Total Score [place] Office Visit from 06/10/2021 in [place]  Total GAD-7 Score 1      PHQ2-9    [place] Office Visit from 06/10/2021 in [place] Office Visit from 10/18/2020 in [place] from 08/22/2018 in [place] Office Visit from 02/16/2018 in [place] Visit from 06/09/2017 in [place]  PHQ-2 Total Score 0 0 1 0 2  PHQ-9 Total Score 4 0 -- -- 6      [facility] from 09/03/2021 in [place] No Risk        Review of Systems:  Review of Systems  Respiratory:  Positive for shortness of breath.        Occ cough  Gastrointestinal:  Positive for abdominal pain, diarrhea and nausea.       Has upcoming endoscopy and colonoscopy  Musculoskeletal:  Negative for gait problem.  Psychiatric/Behavioral:         Please refer to HPI   Medications: I have reviewed the patient's current medications.  Current Outpatient Medications  Medication Sig Dispense Refill   albuterol (VENTOLIN HFA) 108 (90 Base) MCG/ACT inhaler INHALE 2 PUFFS BY MOUTH EVERY 6 HOURS AS NEEDED 18 g 0   AMBULATORY NON FORMULARY MEDICATION Continuous positive airway pressure (CPAP) machine set at autotitration of H2O pressure, with all supplemental supplies as needed. 1 each 0   chlorthalidone (HYGROTON) 25 MG tablet Take 1 tablet (  25 mg total) by mouth daily. 90 tablet 1   colestipol (COLESTID) 1 g tablet Take 1 tablet (1 g total) by mouth daily. 90 tablet 1   dicyclomine (BENTYL) 20 MG tablet TAKE 1 TABLET BY MOUTH THREE TIMES DAILY 270 tablet 0   ENTRESTO 49-51 MG Take 1 tablet by mouth 2 (two) times daily.     finasteride (PROSCAR) 5 MG tablet Take 1 tablet (5 mg total) by mouth daily. 90 tablet 0   ibuprofen (ADVIL) 800 MG tablet Take 1 tablet (800 mg total) by mouth  every 8 (eight) hours as needed. 90 tablet 0   loratadine (CLARITIN) 10 MG tablet Take 10 mg by mouth daily.     metoprolol succinate (TOPROL-XL) 25 MG 24 hr tablet Take 50 mg by mouth in the morning and at bedtime.     Omega-3 Fatty Acids (FISH OIL) 1200 MG CAPS Take 1,200 mg by mouth daily.     ondansetron (ZOFRAN) 8 MG tablet TAKE 1 TABLET BY MOUTH EVERY 8 HOURS AS NEEDED FOR NAUSEA FOR VOMITING 90 tablet 2   pantoprazole (PROTONIX) 40 MG tablet Take 1 tablet (40 mg total) by mouth 2 (two) times daily. 180 tablet 1   ZYPITAMAG 2 MG TABS TAKE 1 TABLET BY MOUTH DAILY. 30 tablet 5   [START ON 12/09/2021] modafinil (PROVIGIL) 200 MG tablet Take 1 tablet (200 mg total) by mouth daily. 30 tablet 5   Multiple Vitamin (MULTIVITAMIN) tablet Take 1 tablet by mouth daily.  (Patient not taking: Reported on 11/11/2021)     sertraline (ZOLOFT) 100 MG tablet TAKE 1 & 1/2 (ONE & ONE-HALF) TABLETS BY MOUTH ONCE DAILY 135 tablet 1   Sod Picosulfate-Mag Ox-Cit Acd (CLENPIQ) 10-3.5-12 MG-GM -GM/160ML SOLN Take 1 kit by mouth as directed. 320 mL 0   No current facility-administered medications for this visit.    Medication Side Effects: None  Allergies:  Allergies  Allergen Reactions   Fentanyl Other (See Comments)    Avoid opiates Other reaction(s): Other Avoid opiates   Other     04/06/18 Patient is a recovering narcotic abuser and, per wife, is NEVER to receive controlled substances (e.g. Narcotics, sedatives, etc.) Other reaction(s): Unknown 04/06/18 Patient is a recovering narcotic abuser and, per wife, is NEVER to receive controlled substances (e.g. Narcotics, sedatives, etc.)   Aspirin Other (See Comments)    Per pt Other reaction(s): Unknown Per pt   Atorvastatin Nausea And Vomiting    GI side effects.  GI side effects.    Prednisone     Can not tolerate oral but can tolerate shots.  Other reaction(s): Agitation Can not tolerate oral but can tolerate shots.    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    Past Medical History, Surgical history, Social history, and Family history were reviewed and updated as appropriate.   Please see review of systems for further details on the patient's review from today.   Objective:   Physical Exam:  BP 112/66    Pulse 71    Wt 229 lb (103.9 kg)    BMI 31.94 kg/m   Physical Exam Constitutional:      General: He  is not in acute distress. Musculoskeletal:        General: No deformity.  Neurological:     Mental Status: He is alert and oriented to person, place, and time.     Coordination: Coordination normal.  Psychiatric:        Attention and Perception: Attention and perception normal. He does not perceive auditory or visual hallucinations.        Mood and Affect: Mood normal. Mood is not anxious or depressed. Affect is not labile, blunt, angry or inappropriate.        Speech: Speech normal.        Behavior: Behavior normal.        Thought Content: Thought content normal. Thought content is not paranoid or delusional. Thought content does not include homicidal or suicidal ideation. Thought content does not include homicidal or suicidal plan.        Cognition and Memory: Cognition and memory normal.        Judgment: Judgment normal.     Comments: Insight intact    Lab Review:     Component Value Date/Time   NA 141 09/12/2021 0000   K 4.2 09/12/2021 0000   CL 96 (L) 09/12/2021 0000   CO2 37 (H) 09/12/2021 0000   GLUCOSE 86 09/12/2021 0000   BUN 22 09/12/2021 0000   CREATININE 1.21 09/12/2021 0000   CALCIUM 9.9 09/12/2021 0000   PROT 7.8 09/05/2021 1232   ALBUMIN 4.4 11/23/2016 1537   AST 52 (H) 09/05/2021 1232   ALT 50 (H)  09/05/2021 1232   ALKPHOS 59 11/23/2016 1537   BILITOT 0.8 09/05/2021 1232   GFRNONAA >60 09/03/2021 1854   GFRNONAA 60 03/20/2020 1044   GFRAA 69 03/20/2020 1044       Component Value Date/Time   WBC 8.6 09/03/2021 1854   RBC 4.85 09/03/2021 1854   HGB 13.8 09/03/2021 1854   HCT 40.2 09/03/2021 1854   PLT 208 09/03/2021 1854   MCV 82.9 09/03/2021 1854   MCH 28.5 09/03/2021 1854   MCHC 34.3 09/03/2021 1854   RDW 14.0 09/03/2021 1854   LYMPHSABS 1,391 06/09/2021 1001   EOSABS 202 06/09/2021 1001   BASOSABS 33 06/09/2021 1001    No results found for: POCLITH, LITHIUM   No results found for: PHENYTOIN, PHENOBARB, VALPROATE, CBMZ   .res Assessment: Plan:    Pt seen for 30 minutes and time spent discussing response to decreasing and discontinuing Olanzapine and with initiation of Modafinil. He reports that he did not experience any worsening in mood, anxiety, or insomnia with discontinuation of Olanzapine. He reports that Modafinil has been helpful for excessive fatigue/daytime somnolence with 200 mg in the morning.  Will continue Modafinil 200 mg po q am for excessive daytime somnolence secondary to sleep apnea.  Continue Sertraline 150 mg po qd for anxiety and depression.  Pt to follow-up in 6 months or sooner if clinically indicated.  Patient advised to contact office with any questions, adverse effects, or acute worsening in signs and symptoms.   Viraaj was seen today for follow-up.  Diagnoses and all orders for this visit:  Generalized anxiety disorder -     sertraline (ZOLOFT) 100 MG tablet; TAKE 1 & 1/2 (ONE & ONE-HALF) TABLETS BY MOUTH ONCE DAILY  OSA on CPAP -     modafinil (PROVIGIL) 200 MG tablet; Take 1 tablet (200 mg total) by mouth daily.     Please see After Visit Summary for patient specific instructions.  Future Appointments  Date Time Provider [place]  11/11/2021  1:40 PM Donella Stade, PA-C [facility] None  11/26/2021  3:00 PM [name], MD [facility] None  12/01/2021 11:15 AM Laurin Coder, MD [facility] None  04/21/2022 11:00 AM [facility] PHARMACIST [facility] None  05/14/2022 11:00 AM Thayer Headings, PMHNP [facility] None    No orders of the defined types were placed in this encounter.   -------------------------------

## 2021-11-11 NOTE — Telephone Encounter (Signed)
Left a detailed message that the patients instructions should still be in his mychart.If there is anything you need please contact the office. ?

## 2021-11-11 NOTE — Telephone Encounter (Signed)
Patient called requesting you send his prep instructions to his My Chart.  He said he could not find the previous ones that had been sent back in January.  Thank you. ?

## 2021-11-12 ENCOUNTER — Telehealth: Payer: Self-pay | Admitting: Gastroenterology

## 2021-11-12 ENCOUNTER — Other Ambulatory Visit: Payer: Self-pay

## 2021-11-12 MED ORDER — NA SULFATE-K SULFATE-MG SULF 17.5-3.13-1.6 GM/177ML PO SOLN
1.0000 | Freq: Once | ORAL | 0 refills | Status: AC
Start: 2021-11-12 — End: 2021-11-12

## 2021-11-12 NOTE — Telephone Encounter (Signed)
Patient called and stated that he went to pick up his prep medication at the pharmacy and it was going to cost him 200 dollars. Seeking advice if he can get something cheaper. Procedure scheduled for 3/14. Please advise.  ?

## 2021-11-12 NOTE — Telephone Encounter (Signed)
Spoke with the patient stating that we can provide him the sample of clenpiq for his prep. Stated he will come tomorrow to our [facility] office to pick it up. Advised him to come before 4:00pm. Patient voiced understanding. ?

## 2021-11-13 ENCOUNTER — Telehealth: Payer: Self-pay | Admitting: Pulmonary Disease

## 2021-11-13 ENCOUNTER — Telehealth: Payer: Self-pay

## 2021-11-13 NOTE — Telephone Encounter (Signed)
Left message at [place] at [facility] for nurse to call back to discuss Pulmonary clearance.  ? ?Cardiac clearance request routed to Dr. Stanford Breed via epic. Will await response. See Cardiac clearance telephone encounter for details.  ?

## 2021-11-13 NOTE — Telephone Encounter (Signed)
Cheral Marker ?Apr 09, 1961 ?[id number] ? ?Dear Dr. Kirk Ruths: ? ?We have scheduled the above named patient for an endoscopic procedure on 11/18/21. ? ?We are requesting MEDICAL cardiac clearance to proceed with colonoscopy at [facility] on 11/18/21. ? ?Please route your response to Berniece Salines, RN or fax response to [phone]. ? ?Sincerely, ? ? ? ?[facility] ?  ?

## 2021-11-14 ENCOUNTER — Telehealth: Payer: Self-pay

## 2021-11-14 NOTE — Telephone Encounter (Signed)
? ?  Cheral Marker ?[birth year] ?[id number] ? ?Dear Dr. Ander Slade: ? ?We have scheduled the above named patient for a colonoscopy procedure on 11/18/21 at [facility] hospital.  ? ?We are requesting Pulmonary clearance to proceed with colonoscopy at [facility] on 11/18/21. ? ?  ?Please route your response to Berniece Salines, RN or fax response to [phone]. ? ? ?Sincerely, ? ? ? ?[facility] ?  ?

## 2021-11-14 NOTE — Telephone Encounter (Signed)
[name], MD  You 14 hours ago (5:53 PM)  ? ?Ok for procedure from a cardiac standpoint  ?Kirk Ruths   ? ?

## 2021-11-17 ENCOUNTER — Telehealth: Payer: Self-pay | Admitting: Pulmonary Disease

## 2021-11-17 NOTE — Telephone Encounter (Signed)
Correction he is having a colonoscopy and EGD, still ok to proceed?  ?

## 2021-11-17 NOTE — Telephone Encounter (Signed)
Cleared for colonoscopy from a pulmonary perspective ? ?Jake [name] ?

## 2021-11-17 NOTE — Telephone Encounter (Signed)
Laurin Coder, MD ?  ?  10:02 AM ?Note ?Cleared for colonoscopy from a pulmonary perspective ?  ?[name]  ?  ? ?

## 2021-11-17 NOTE — Telephone Encounter (Addendum)
Cleared for both colonoscopy and EGD ?

## 2021-11-17 NOTE — Telephone Encounter (Signed)
Mickel Baas from [facility] checking on surgical clearance for patient. Patient having surgery tomorrow. Mickel Baas phone number is [phone]. Main number is [phone]. ?

## 2021-11-17 NOTE — Telephone Encounter (Signed)
Called and spoke with Mickel Baas to let her know that patient is cleared for both EGD and Colonoscopy. She expressed understanding. Nothing further needed at this time.  ?

## 2021-11-18 ENCOUNTER — Other Ambulatory Visit: Payer: Self-pay

## 2021-11-18 ENCOUNTER — Ambulatory Visit (HOSPITAL_COMMUNITY)
Admission: RE | Admit: 2021-11-18 | Discharge: 2021-11-18 | Disposition: A | Discharge status: Home / Self Care | Payer: Medicare HMO | Source: Ambulatory Visit | Attending: Gastroenterology | Admitting: Gastroenterology

## 2021-11-18 ENCOUNTER — Encounter (HOSPITAL_COMMUNITY): Payer: Self-pay | Admitting: Gastroenterology

## 2021-11-18 ENCOUNTER — Encounter (HOSPITAL_COMMUNITY)
Admission: RE | Disposition: A | Discharge status: Home / Self Care | Payer: Self-pay | Source: Ambulatory Visit | Attending: Gastroenterology

## 2021-11-18 ENCOUNTER — Ambulatory Visit (HOSPITAL_COMMUNITY): Payer: Medicare HMO | Admitting: Certified Registered Nurse Anesthetist

## 2021-11-18 ENCOUNTER — Ambulatory Visit (HOSPITAL_BASED_OUTPATIENT_CLINIC_OR_DEPARTMENT_OTHER): Payer: Medicare HMO | Admitting: Certified Registered Nurse Anesthetist

## 2021-11-18 DIAGNOSIS — K635 Polyp of colon: Secondary | ICD-10-CM

## 2021-11-18 DIAGNOSIS — R194 Change in bowel habit: Secondary | ICD-10-CM

## 2021-11-18 DIAGNOSIS — K2970 Gastritis, unspecified, without bleeding: Secondary | ICD-10-CM

## 2021-11-18 DIAGNOSIS — K641 Second degree hemorrhoids: Secondary | ICD-10-CM

## 2021-11-18 DIAGNOSIS — Z9981 Dependence on supplemental oxygen: Secondary | ICD-10-CM | POA: Diagnosis not present

## 2021-11-18 DIAGNOSIS — D125 Benign neoplasm of sigmoid colon: Secondary | ICD-10-CM | POA: Diagnosis not present

## 2021-11-18 DIAGNOSIS — K921 Melena: Secondary | ICD-10-CM | POA: Insufficient documentation

## 2021-11-18 DIAGNOSIS — Z87891 Personal history of nicotine dependence: Secondary | ICD-10-CM | POA: Diagnosis not present

## 2021-11-18 DIAGNOSIS — J984 Other disorders of lung: Secondary | ICD-10-CM

## 2021-11-18 DIAGNOSIS — G8929 Other chronic pain: Secondary | ICD-10-CM

## 2021-11-18 DIAGNOSIS — K219 Gastro-esophageal reflux disease without esophagitis: Secondary | ICD-10-CM | POA: Diagnosis not present

## 2021-11-18 DIAGNOSIS — K2971 Gastritis, unspecified, with bleeding: Secondary | ICD-10-CM | POA: Diagnosis not present

## 2021-11-18 DIAGNOSIS — I110 Hypertensive heart disease with heart failure: Secondary | ICD-10-CM | POA: Diagnosis not present

## 2021-11-18 DIAGNOSIS — R197 Diarrhea, unspecified: Secondary | ICD-10-CM | POA: Diagnosis not present

## 2021-11-18 DIAGNOSIS — F319 Bipolar disorder, unspecified: Secondary | ICD-10-CM | POA: Diagnosis not present

## 2021-11-18 DIAGNOSIS — I5020 Unspecified systolic (congestive) heart failure: Secondary | ICD-10-CM | POA: Diagnosis not present

## 2021-11-18 DIAGNOSIS — Z980 Intestinal bypass and anastomosis status: Secondary | ICD-10-CM | POA: Diagnosis not present

## 2021-11-18 DIAGNOSIS — K319 Disease of stomach and duodenum, unspecified: Secondary | ICD-10-CM | POA: Insufficient documentation

## 2021-11-18 DIAGNOSIS — I509 Heart failure, unspecified: Secondary | ICD-10-CM | POA: Diagnosis not present

## 2021-11-18 DIAGNOSIS — I5022 Chronic systolic (congestive) heart failure: Secondary | ICD-10-CM

## 2021-11-18 DIAGNOSIS — Z1211 Encounter for screening for malignant neoplasm of colon: Secondary | ICD-10-CM | POA: Diagnosis not present

## 2021-11-18 DIAGNOSIS — F418 Other specified anxiety disorders: Secondary | ICD-10-CM | POA: Diagnosis not present

## 2021-11-18 DIAGNOSIS — G4733 Obstructive sleep apnea (adult) (pediatric): Secondary | ICD-10-CM | POA: Diagnosis not present

## 2021-11-18 DIAGNOSIS — I2510 Atherosclerotic heart disease of native coronary artery without angina pectoris: Secondary | ICD-10-CM

## 2021-11-18 SURGERY — COLONOSCOPY WITH PROPOFOL
Anesthesia: Monitor Anesthesia Care

## 2021-11-18 MED ORDER — PROPOFOL 10 MG/ML IV BOLUS
INTRAVENOUS | Status: DC | PRN
  Administered 2021-11-18: 20 mg via INTRAVENOUS
  Administered 2021-11-18: 50 mg via INTRAVENOUS

## 2021-11-18 MED ORDER — PHENYLEPHRINE 40 MCG/ML (10ML) SYRINGE FOR IV PUSH (FOR BLOOD PRESSURE SUPPORT)
PREFILLED_SYRINGE | INTRAVENOUS | Status: DC | PRN
  Administered 2021-11-18 (×2): 80 ug via INTRAVENOUS
  Administered 2021-11-18: 40 ug via INTRAVENOUS
  Administered 2021-11-18 (×2): 80 ug via INTRAVENOUS

## 2021-11-18 MED ORDER — LACTATED RINGERS IV SOLN: INTRAVENOUS | Status: DC | PRN

## 2021-11-18 MED ORDER — SODIUM CHLORIDE 0.9 % IV SOLN: INTRAVENOUS | Status: DC

## 2021-11-18 MED ORDER — PROPOFOL 500 MG/50ML IV EMUL
INTRAVENOUS | Status: DC | PRN
Start: 2021-11-18 — End: 2021-11-18
  Administered 2021-11-18: 150 ug/kg/min via INTRAVENOUS

## 2021-11-18 MED ORDER — LIDOCAINE 2% (20 MG/ML) 5 ML SYRINGE
INTRAMUSCULAR | Status: DC | PRN
  Administered 2021-11-18: 100 mg via INTRAVENOUS

## 2021-11-18 SURGICAL SUPPLY — 25 items

## 2021-11-18 NOTE — Anesthesia Postprocedure Evaluation (Signed)
Anesthesia Post Note ? ?Patient: Jake [name] ? ?Procedure(s) Performed: COLONOSCOPY WITH PROPOFOL ?ESOPHAGOGASTRODUODENOSCOPY (EGD) WITH PROPOFOL ?BIOPSY ?POLYPECTOMY ? ?  ? ?Patient location during evaluation: Endoscopy ?Anesthesia Type: MAC ?Level of consciousness: awake and alert ?Pain management: pain level controlled ?Vital Signs Assessment: post-procedure vital signs reviewed and stable ?Respiratory status: spontaneous breathing, nonlabored ventilation and respiratory function stable ?Cardiovascular status: stable and blood pressure returned to baseline ?Postop Assessment: no apparent nausea or vomiting ?Anesthetic complications: no ? ? ?No notable events documented. ? ?Last Vitals:  ?Vitals:  ? 11/18/21 1140 11/18/21 1150  ?BP: (!) 111/56 107/61  ?Pulse: 71 65  ?Resp: 17 16  ?Temp:    ?SpO2: 95% 96%  ?  ?Last Pain:  ?Vitals:  ? 11/18/21 1150  ?TempSrc:   ?PainSc: 5   ? ? ?  ?  ?  ?  ?  ?  ? ?Christina Gintz,W. EDMOND ? ? ? ? ?

## 2021-11-18 NOTE — Progress Notes (Signed)
GREAT news. Seborrheic keratosis, benign and no concern!

## 2021-11-18 NOTE — Discharge Instructions (Signed)
YOU HAD AN ENDOSCOPIC PROCEDURE TODAY: Refer to the procedure report and other information in the discharge instructions given to you for any specific questions about what was found during the examination. If this information does not answer your questions, please call [place] office at [phone] to clarify.  ° °YOU SHOULD EXPECT: Some feelings of bloating in the abdomen. Passage of more gas than usual. Walking can help get rid of the air that was put into your GI tract during the procedure and reduce the bloating. If you had a lower endoscopy (such as a colonoscopy or flexible sigmoidoscopy) you may notice spotting of blood in your stool or on the toilet paper. Some abdominal soreness may be present for a day or two, also. ° °DIET: Your first meal following the procedure should be a light meal and then it is ok to progress to your normal diet. A half-sandwich or bowl of soup is an example of a good first meal. Heavy or fried foods are harder to digest and may make you feel nauseous or bloated. Drink plenty of fluids but you should avoid alcoholic beverages for 24 hours. If you had a esophageal dilation, please see attached instructions for diet.   ° °ACTIVITY: Your care partner should take you home directly after the procedure. You should plan to take it easy, moving slowly for the rest of the day. You can resume normal activity the day after the procedure however YOU SHOULD NOT DRIVE, use power tools, machinery or perform tasks that involve climbing or major physical exertion for 24 hours (because of the sedation medicines used during the test).  ° °SYMPTOMS TO REPORT IMMEDIATELY: °A gastroenterologist can be reached at any hour. Please call [phone]  for any of the following symptoms:  °Following lower endoscopy (colonoscopy, flexible sigmoidoscopy) °Excessive amounts of blood in the stool  °Significant tenderness, worsening of abdominal pains  °Swelling of the abdomen that is new, acute  °Fever of 100° or  higher  °Following upper endoscopy (EGD, EUS, ERCP, esophageal dilation) °Vomiting of blood or coffee ground material  °New, significant abdominal pain  °New, significant chest pain or pain under the shoulder blades  °Painful or persistently difficult swallowing  °New shortness of breath  °Black, tarry-looking or red, bloody stools ° °FOLLOW UP:  °If any biopsies were taken you will be contacted by phone or by letter within the next 1-3 weeks. Call [phone]  if you have not heard about the biopsies in 3 weeks.  °Please also call with any specific questions about appointments or follow up tests. ° °

## 2021-11-18 NOTE — Interval H&P Note (Signed)
History and Physical Interval Note: ? ?11/18/2021 ?10:23 AM ? ?Jake [name]  has presented today for surgery, with the diagnosis of CHF, home oxygen, colon cancer screeing, gerd.  The various methods of treatment have been discussed with the patient and family. After consideration of risks, benefits and other options for treatment, the patient has consented to  Procedure(s): ?COLONOSCOPY WITH PROPOFOL (N/A) ?ESOPHAGOGASTRODUODENOSCOPY (EGD) WITH PROPOFOL (N/A) as a surgical intervention.  The patient's history has been reviewed, patient examined, no change in status, stable for surgery.  I have reviewed the patient's chart and labs.  Questions were answered to the patient's satisfaction.   ? ? ?Yvonne Stopher V Precious Gilchrest ? ? ?

## 2021-11-18 NOTE — H&P (Signed)
? ?GASTROENTEROLOGY PROCEDURE H&P NOTE  ? ?Primary Care Physician: ?Donella Stade, PA-C ? ? ? ?Reason for Procedure:  Diarrhea, change in bowel habits, GERD, hematochezia, colon cancer screening ? ?Plan:    EGD, colonoscopy ? ?Patient is appropriate for endoscopic procedure(s) at [facility] unit. ? ?The nature of the procedure, as well as the risks, benefits, and alternatives were carefully and thoroughly reviewed with the patient. Ample time for discussion and questions allowed. The patient understood, was satisfied, and agreed to proceed.  ? ? ? ?HPI: ?Hubert Raatz is a 61 y.o. male who presents for EGD and colonoscopy for evaluation of multiple GI symptoms to include diarrhea, change in bowel habits, hematochezia, colon cancer screening, GERD. ? ?Due to underlying comorbidities to include OSA (on CPAP), home O2, CHF, procedures scheduled at [facility] unit due to elevated periprocedural risks. ? ?Past Medical History:  ?Diagnosis Date  ? Arthritis   ? lower back  ? Bipolar disorder ([place])   ? GERD (gastroesophageal reflux disease)   ? Hyperlipidemia   ? Hypertension   ? Substance abuse in remission ([place]) 04/06/2018  ? AVOID OPIATES!  ? ? ?Past Surgical History:  ?Procedure Laterality Date  ? ANKLE FRACTURE SURGERY Left   ? APPENDECTOMY    ? CERVICAL FUSION    ? CHOLECYSTECTOMY    ? COLON SURGERY    ? HERNIA REPAIR    ? ? ?Prior to Admission medications   ?Medication Sig Start Date End Date Taking? Authorizing Provider  ?albuterol (VENTOLIN HFA) 108 (90 Base) MCG/ACT inhaler INHALE 2 PUFFS BY MOUTH EVERY 6 HOURS AS NEEDED 06/02/21  Yes Breeback, Jade L, PA-C  ?AMBULATORY NON FORMULARY MEDICATION Continuous positive airway pressure (CPAP) machine set at autotitration of H2O pressure, with all supplemental supplies as needed. 02/21/20  Yes Breeback, Jade L, PA-C  ?chlorthalidone (HYGROTON) 25 MG tablet Take 1 tablet (25 mg total) by mouth daily. 09/05/21  Yes Breeback, Jade L, PA-C   ?colestipol (COLESTID) 1 g tablet Take 1 tablet (1 g total) by mouth daily. 10/24/21  Yes Breeback, Jade L, PA-C  ?cyclobenzaprine (FLEXERIL) 10 MG tablet Take 1 tablet (10 mg total) by mouth 3 (three) times daily as needed for muscle spasms. ?Patient taking differently: Take 10 mg by mouth 2 (two) times daily. 11/11/21  Yes Breeback, Jade L, PA-C  ?dicyclomine (BENTYL) 20 MG tablet TAKE 1 TABLET BY MOUTH THREE TIMES DAILY 09/22/21  Yes Breeback, Jade L, PA-C  ?ENTRESTO 49-51 MG Take 1 tablet by mouth 2 (two) times daily. 09/17/21  Yes [provider]  ?finasteride (PROSCAR) 5 MG tablet Take 1 tablet (5 mg total) by mouth daily. 11/03/21  Yes Breeback, Jade L, PA-C  ?ibuprofen (ADVIL) 800 MG tablet Take 1 tablet (800 mg total) by mouth every 8 (eight) hours as needed. 11/03/21  Yes Breeback, Jade L, PA-C  ?loratadine (CLARITIN) 10 MG tablet Take 10 mg by mouth daily.   Yes [provider]  ?metoprolol succinate (TOPROL-XL) 50 MG 24 hr tablet Take 50 mg by mouth daily.   Yes [provider]  ?modafinil (PROVIGIL) 200 MG tablet Take 1 tablet (200 mg total) by mouth daily. 12/09/21  Yes Thayer Headings, [place]  ?Omega-3 Fatty Acids (FISH OIL PO) Take 1,400 mg by mouth daily.   Yes [provider]  ?ondansetron (ZOFRAN) 8 MG tablet TAKE 1 TABLET BY MOUTH EVERY 8 HOURS AS NEEDED FOR NAUSEA FOR VOMITING 07/21/21  Yes Breeback, Jade L, PA-C  ?OVER  THE COUNTER MEDICATION Place 1 spray into both nostrils daily. Triamcinolone acetonide Nasal spray   Yes [provider]  ?pantoprazole (PROTONIX) 40 MG tablet Take 1 tablet (40 mg total) by mouth 2 (two) times daily. 11/03/21  Yes Breeback, Jade L, PA-C  ?pseudoephedrine-acetaminophen (TYLENOL SINUS) 30-500 MG TABS tablet Take 2 tablets by mouth every 4 (four) hours as needed (Sinus).   Yes [provider]  ?sertraline (ZOLOFT) 100 MG tablet TAKE 1 & 1/2 (ONE & ONE-HALF) TABLETS BY MOUTH ONCE DAILY 11/11/21  Yes Thayer Headings, [place]   ?Sod Picosulfate-Mag Ox-Cit Acd (CLENPIQ) 10-3.5-12 MG-GM -GM/160ML SOLN Take 1 kit by mouth as directed. 11/10/21  Yes Jasmeet Gehl V, DO  ?ZYPITAMAG 2 MG TABS TAKE 1 TABLET BY MOUTH DAILY. 09/11/20  Yes Breeback, Jade L, PA-C  ? ? ?Current Facility-Administered Medications  ?Medication Dose Route Frequency Provider Last Rate Last Admin  ? 0.9 %  sodium chloride infusion   Intravenous Continuous Kindred Reidinger V, DO      ? ? ?Allergies as of 09/22/2021 - Review Complete 09/22/2021  ?Allergen Reaction Noted  ? Fentanyl Other (See Comments) 04/06/2018  ? Other  04/06/2018  ? Aspirin Other (See Comments) 08/29/2015  ? Atorvastatin Nausea And Vomiting 11/03/2019  ? Prednisone  09/27/2019  ? Contrast media [iodinated contrast media] Palpitations 05/04/2016  ? Nitroglycerin Nausea And Vomiting 05/04/2016  ? ? ?Family History  ?Problem Relation Age of Onset  ? Cancer Mother   ?     breast  ? Depression Mother   ? Hyperlipidemia Mother   ? Dementia Mother   ? Alcohol abuse Father   ? Cancer Father   ?     skin  ? Hyperlipidemia Father   ? Alzheimer's disease Father   ? Cancer Maternal Grandfather   ? Cancer Paternal Grandmother   ? Pancreatic cancer Paternal Uncle   ? Colon cancer Neg Hx   ? Esophageal cancer Neg Hx   ? Stomach cancer Neg Hx   ? ? ?Social History  ? ?Socioeconomic History  ? Marital status: Married  ?  Spouse name: Dawn  ? Number of children: 1  ? Years of education: 64  ? Highest education level: Not on file  ?Occupational History  ? Occupation: Disabled  ?Tobacco Use  ? Smoking status: Former  ?  Packs/day: 2.00  ?  Years: 25.00  ?  Pack years: 50.00  ?  Types: Cigarettes  ?  Start date: 44  ?  Quit date: 2010  ?  Years since quitting: 13.2  ? Smokeless tobacco: Never  ?Vaping Use  ? Vaping Use: Never used  ?Substance and Sexual Activity  ? Alcohol use: No  ? Drug use: No  ?  Comment: 04/06/2018 Patient's wife wanted [place] to be very aware that her husband is a recovering narcotic abuser  ? Sexual  activity: Yes  ?Other Topics Concern  ? Not on file  ?Social History Narrative  ? Lives with his wife. Likes to spend to time with grandchildren. Does not exercise due to back issues in the past and respiratory problems. Daily caffeine use 2-3 drinks a day.   ? ?Social Determinants of Health  ? ?Financial Resource Strain: Not on file  ?Food Insecurity: Not on file  ?Transportation Needs: Not on file  ?Physical Activity: Not on file  ?Stress: Not on file  ?Social Connections: Not on file  ?Intimate Partner Violence: Not on file  ? ? ?Physical Exam: ?Vital signs in last  24 hours: ?_0  (!) 152/73   Pulse 73   Temp 98.9 ?F (37.2 ?C) (Temporal)   Resp (!) 22   SpO2 96%  ?GEN: NAD ?EYE: Sclerae anicteric ?ENT: MMM ?CV: Non-tachycardic ?Pulm: CTA b/l ?GI: Soft, NT/ND ?NEURO:  Alert & Oriented x 3 ? ? ?Gerrit Heck, DO ?[facility] ? ? ?11/18/2021 8:52 AM ? ?

## 2021-11-18 NOTE — Op Note (Signed)
[facility] ?Patient Name: Jake [name] ?Procedure Date: 11/18/2021 ?MRN: [id number] ?Attending MD: Gerrit Heck , MD ?Date of Birth: July 02, 1961 ?CSN: [id number] ?Age: 61 ?Admit Type: Outpatient ?Procedure:                Colonoscopy ?Indications:              Hematochezia, Change in bowel habits, Diarrhea ?Providers:                Gerrit Heck, MD, Jeanella Cara, RN,  ?                          Gloris Ham, Technician, [employer]  ?                          Technician, Technician ?Referring MD:              ?Medicines:                Monitored Anesthesia Care ?Complications:            No immediate complications. ?Estimated Blood Loss:     Estimated blood loss was minimal. ?Procedure:                Pre-Anesthesia Assessment: ?                          - Prior to the procedure, a History and Physical  ?                          was performed, and patient medications and  ?                          allergies were reviewed. The patient's tolerance of  ?                          previous anesthesia was also reviewed. The risks  ?                          and benefits of the procedure and the sedation  ?                          options and risks were discussed with the patient.  ?                          All questions were answered, and informed consent  ?                          was obtained. Prior Anticoagulants: The patient has  ?                          taken no previous anticoagulant or antiplatelet  ?                          agents. ASA Grade Assessment: III - A patient with  ?                          severe  systemic disease. After reviewing the risks  ?                          and benefits, the patient was deemed in  ?                          satisfactory condition to undergo the procedure. ?                          After obtaining informed consent, the colonoscope  ?                          was passed under direct vision. Throughout the  ?                           procedure, the patient's blood pressure, pulse, and  ?                          oxygen saturations were monitored continuously. The  ?                          [id number]-HQ190L ([id number]) Olympus colonoscope was  ?                          introduced through the anus and advanced to the the  ?                          terminal ileum. The colonoscopy was performed  ?                          without difficulty. The patient tolerated the  ?                          procedure well. The quality of the bowel  ?                          preparation was good. The terminal ileum, ileocecal  ?                          valve, appendiceal orifice, and rectum were  ?                          photographed. ?Scope In: 10:52:10 AM ?Scope Out: 11:09:12 AM ?Scope Withdrawal Time: 0 hours 13 minutes 55 seconds  ?Total Procedure Duration: 0 hours 17 minutes 2 seconds  ?Findings: ?     Hemorrhoids were found on perianal exam. ?     A 6 mm polyp was found in the sigmoid colon. The polyp was sessile. The  ?     polyp was removed with a cold snare. Resection and retrieval were  ?     complete. Estimated blood loss was minimal. ?     There was evidence of a prior end-to-side colo-colonic anastomosis in  ?     the sigmoid colon. This was patent and was characterized by healthy  ?     appearing mucosa. The  anastomosis was traversed. ?     Normal mucosa was found in the remainder of the colon. Biopsies for  ?     histology were taken with a cold forceps from the right colon and left  ?     colon for evaluation of microscopic colitis. Estimated blood loss was  ?     minimal. ?     Non-bleeding internal hemorrhoids were found during retroflexion. The  ?     hemorrhoids were Grade II (internal hemorrhoids that prolapse but reduce  ?     spontaneously). ?     The terminal ileum appeared normal. ?Impression:               - Hemorrhoids found on perianal exam. ?                          - One 6 mm polyp in the sigmoid colon, removed with  ?                           a cold snare. Resected and retrieved. ?                          - Patent end-to-side colo-colonic anastomosis,  ?                          characterized by healthy appearing mucosa. ?                          - Normal mucosa in the entire examined colon.  ?                          Biopsied. ?                          - Non-bleeding internal hemorrhoids. ?                          - The examined portion of the ileum was normal. ?Moderate Sedation: ?     Not Applicable - Patient had care per Anesthesia. ?Recommendation:           - Patient has a contact number available for  ?                          emergencies. The signs and symptoms of potential  ?                          delayed complications were discussed with the  ?                          patient. Return to normal activities tomorrow.  ?                          Written discharge instructions were provided to the  ?                          patient. ?                          -  Resume previous diet. ?                          - Continue present medications. ?                          - Await pathology results. ?                          - Repeat colonoscopy for surveillance based on  ?                          pathology results. ?                          - Return to GI office PRN. ?Procedure Code(s):        --- Professional --- ?                          [phone], Colonoscopy, flexible; with removal of  ?                          tumor(s), polyp(s), or other lesion(s) by snare  ?                          technique ?                          45380, 59, Colonoscopy, flexible; with biopsy,  ?                          single or multiple ?Diagnosis Code(s):        --- Professional --- ?                          K64.1, Second degree hemorrhoids ?                          K63.5, Polyp of colon ?                          Z98.0, Intestinal bypass and anastomosis status ?                          K92.1, Melena (includes Hematochezia) ?                           R19.4, Change in bowel habit ?                          R19.7, Diarrhea, unspecified ?CPT copyright 2019 American Medical Association. All rights reserved. ?The codes documented in this report are preliminary and upon coder review may  ?be revised to meet current compliance requirements. ?Gerrit Heck, MD ?11/18/2021 11:21:39 AM ?Number of Addenda: 0 ?

## 2021-11-18 NOTE — Anesthesia Preprocedure Evaluation (Addendum)
Anesthesia Evaluation  ?Patient identified by MRN, date of birth, ID band ?Patient awake ? ? ? ?Reviewed: ?Allergy & Precautions, H&P , NPO status , Patient's Chart, lab work & pertinent test results, reviewed documented beta blocker date and time  ? ?Airway ?Mallampati: III ? ?TM Distance: >3 FB ?Neck ROM: Full ? ? ? Dental ?no notable dental hx. ?(+) Teeth Intact, Dental Advisory Given ?  ?Pulmonary ?shortness of breath and with exertion, sleep apnea, Continuous Positive Airway Pressure Ventilation and Oxygen sleep apnea , former smoker,  ?  ?Pulmonary exam normal ?breath sounds clear to auscultation ? ? ? ? ? ? Cardiovascular ?hypertension, Pt. on medications and Pt. on home beta blockers ?+CHF  ? ?Rhythm:Regular Rate:Normal ? ? ?  ?Neuro/Psych ? Headaches, Anxiety Depression Bipolar Disorder   ? GI/Hepatic ?Neg liver ROS, GERD  Medicated,  ?Endo/Other  ?negative endocrine ROS ? Renal/GU ?negative Renal ROS  ?negative genitourinary ?  ?Musculoskeletal ? ?(+) Arthritis , Osteoarthritis,   ? Abdominal ?  ?Peds ? Hematology ?negative hematology ROS ?(+)   ?Anesthesia Other Findings ? ? Reproductive/Obstetrics ?negative OB ROS ? ?  ? ? ? ? ? ? ? ? ? ? ? ? ? ?  ?  ? ? ? ? ? ? ? ?Anesthesia Physical ?Anesthesia Plan ? ?ASA: 3 ? ?Anesthesia Plan: MAC  ? ?Post-op Pain Management: Minimal or no pain anticipated  ? ?Induction: Intravenous ? ?PONV Risk Score and Plan: 1 and Propofol infusion ? ?Airway Management Planned: Natural Airway and Simple Face Mask ? ?Additional Equipment:  ? ?Intra-op Plan:  ? ?Post-operative Plan:  ? ?Informed Consent: I have reviewed the patients History and Physical, chart, labs and discussed the procedure including the risks, benefits and alternatives for the proposed anesthesia with the patient or authorized representative who has indicated his/her understanding and acceptance.  ? ? ? ?Dental advisory given ? ?Plan Discussed with: CRNA ? ?Anesthesia Plan  Comments:   ? ? ? ? ? ? ?Anesthesia Quick Evaluation ? ?

## 2021-11-18 NOTE — Transfer of Care (Signed)
Immediate Anesthesia Transfer of Care Note ? ?Patient: Jake [name] ? ?Procedure(s) Performed: Procedure(s): ?COLONOSCOPY WITH PROPOFOL (N/A) ?ESOPHAGOGASTRODUODENOSCOPY (EGD) WITH PROPOFOL (N/A) ?BIOPSY ?POLYPECTOMY ? ?Patient Location: PACU ? ?Anesthesia Type:MAC ? ?Level of Consciousness: Patient easily awoken, sedated, comfortable, cooperative, following commands, responds to stimulation.  ? ?Airway & Oxygen Therapy: Patient spontaneously breathing, ventilating well, oxygen via simple oxygen mask. ? ?Post-op Assessment: Report given to PACU RN, vital signs reviewed and stable, moving all extremities.  ? ?Post vital signs: Reviewed and stable. ? ?Complications: No apparent anesthesia complications ? ?Last Vitals:  ?Vitals Value Taken Time  ?BP    ?Temp    ?Pulse 71 11/18/21 1117  ?Resp 21 11/18/21 1118  ?SpO2 95 % 11/18/21 1117  ?Vitals shown include unvalidated device data. ? ?Last Pain:  ?Vitals:  ? 11/18/21 0832  ?TempSrc: Temporal  ?PainSc: 0-No pain  ?   ? ?  ? ?Complications: No notable events documented. ?

## 2021-11-18 NOTE — Op Note (Signed)
[facility] ?Patient Name: Jake [name] ?Procedure Date: 11/18/2021 ?MRN: [id number] ?Attending MD: Gerrit Heck , MD ?Date of Birth: [birth year] ?CSN: [id number] ?Age: 61 ?Admit Type: Outpatient ?Procedure:                Upper GI endoscopy ?Indications:              Suspected esophageal reflux, Diarrhea, Change in  ?                          stools, Hematochezia ?Providers:                Gerrit Heck, MD, Jeanella Cara, RN,  ?                          Gloris Ham, Technician, [employer]  ?                          Technician, Technician ?Referring MD:              ?Medicines:                Monitored Anesthesia Care ?Complications:            No immediate complications. ?Estimated Blood Loss:     Estimated blood loss was minimal. ?Procedure:                Pre-Anesthesia Assessment: ?                          - Prior to the procedure, a History and Physical  ?                          was performed, and patient medications and  ?                          allergies were reviewed. The patient's tolerance of  ?                          previous anesthesia was also reviewed. The risks  ?                          and benefits of the procedure and the sedation  ?                          options and risks were discussed with the patient.  ?                          All questions were answered, and informed consent  ?                          was obtained. Prior Anticoagulants: The patient has  ?                          taken no previous anticoagulant or antiplatelet  ?                          agents. ASA Grade Assessment: III -  A patient with  ?                          severe systemic disease. After reviewing the risks  ?                          and benefits, the patient was deemed in  ?                          satisfactory condition to undergo the procedure. ?                          After obtaining informed consent, the endoscope was  ?                          passed under  direct vision. Throughout the  ?                          procedure, the patient's blood pressure, pulse, and  ?                          oxygen saturations were monitored continuously. The  ?                          [id number]-[id number] ([id number]) Olympus endoscope was introduced  ?                          through the mouth, and advanced to the second part  ?                          of duodenum. The upper GI endoscopy was  ?                          accomplished without difficulty. The patient  ?                          tolerated the procedure well. ?Scope In: ?Scope Out: ?Findings: ?     The examined esophagus was normal. ?     Localized mild inflammation characterized by congestion (edema) and  ?     erythema was found in the gastric antrum. Biopsies were taken with a  ?     cold forceps for histology. Estimated blood loss was minimal. ?     The gastric fundus and gastric body were normal. Biopsies were taken  ?     with a cold forceps for Helicobacter pylori testing. Estimated blood  ?     loss was minimal. ?     The examined duodenum was normal. Biopsies were taken with a cold  ?     forceps for histology. Estimated blood loss was minimal. ?Impression:               - Normal esophagus. ?                          - Gastritis. Biopsied. ?                          -  Normal gastric fundus and gastric body. Biopsied. ?                          - Normal examined duodenum. Biopsied. ?Moderate Sedation: ?     Not Applicable - Patient had care per Anesthesia. ?Recommendation:           - Patient has a contact number available for  ?                          emergencies. The signs and symptoms of potential  ?                          delayed complications were discussed with the  ?                          patient. Return to normal activities tomorrow.  ?                          Written discharge instructions were provided to the  ?                          patient. ?                          - Resume previous diet. ?                           - Continue present medications. ?                          - Await pathology results. ?                          - Perform a colonoscopy today. ?Procedure Code(s):        --- Professional --- ?                          [phone], Esophagogastroduodenoscopy, flexible,  ?                          transoral; with biopsy, single or multiple ?Diagnosis Code(s):        --- Professional --- ?                          K29.70, Gastritis, unspecified, without bleeding ?                          R19.7, Diarrhea, unspecified ?CPT copyright 2019 American Medical Association. All rights reserved. ?The codes documented in this report are preliminary and upon coder review may  ?be revised to meet current compliance requirements. ?Gerrit Heck, MD ?11/18/2021 11:15:39 AM ?Number of Addenda: 0 ?

## 2021-11-18 NOTE — Anesthesia Procedure Notes (Signed)
Procedure Name: [place] ?Date/Time: 11/18/2021 10:35 AM ?Performed by: Deliah Boston, CRNA ?Pre-anesthesia Checklist: Patient identified, Emergency Drugs available, Suction available and Patient being monitored ?Patient Re-evaluated:Patient Re-evaluated prior to induction ?Oxygen Delivery Method: Simple face mask ?Preoxygenation: Pre-oxygenation with 100% oxygen ?Placement Confirmation: positive ETCO2 and breath sounds checked- equal and bilateral ? ? ? ? ?

## 2021-11-19 ENCOUNTER — Encounter (HOSPITAL_COMMUNITY): Payer: Self-pay | Admitting: Gastroenterology

## 2021-11-19 LAB — SURGICAL PATHOLOGY

## 2021-11-22 DIAGNOSIS — G4733 Obstructive sleep apnea (adult) (pediatric): Secondary | ICD-10-CM | POA: Diagnosis not present

## 2021-11-24 ENCOUNTER — Encounter: Payer: Self-pay | Admitting: Gastroenterology

## 2021-11-24 DIAGNOSIS — I10 Essential (primary) hypertension: Secondary | ICD-10-CM | POA: Diagnosis not present

## 2021-11-24 DIAGNOSIS — R0902 Hypoxemia: Secondary | ICD-10-CM | POA: Diagnosis not present

## 2021-11-26 ENCOUNTER — Ambulatory Visit: Payer: Medicare HMO | Admitting: Cardiology

## 2021-12-01 ENCOUNTER — Ambulatory Visit: Payer: Medicare HMO | Admitting: Pulmonary Disease

## 2021-12-02 ENCOUNTER — Other Ambulatory Visit: Payer: Self-pay | Admitting: Physician Assistant

## 2021-12-02 DIAGNOSIS — M545 Low back pain, unspecified: Secondary | ICD-10-CM

## 2021-12-02 DIAGNOSIS — M62830 Muscle spasm of back: Secondary | ICD-10-CM

## 2021-12-11 ENCOUNTER — Telehealth: Payer: Self-pay | Admitting: *Deleted

## 2021-12-11 NOTE — Telephone Encounter (Signed)
ATC patient, LVM regarding CPAP machine and to bring SD card if he has one. ?

## 2021-12-12 ENCOUNTER — Ambulatory Visit: Payer: Medicare HMO | Admitting: Primary Care

## 2021-12-12 ENCOUNTER — Ambulatory Visit: Payer: Medicare HMO | Admitting: Pulmonary Disease

## 2021-12-12 ENCOUNTER — Encounter: Payer: Self-pay | Admitting: Pulmonary Disease

## 2021-12-12 VITALS — BP 124/82 | HR 87 | Temp 97.6°F | Ht 71.0 in | Wt 236.6 lb

## 2021-12-12 DIAGNOSIS — R0602 Shortness of breath: Secondary | ICD-10-CM

## 2021-12-12 MED ORDER — PREDNISONE 20 MG PO TABS: 40.0000 mg | ORAL_TABLET | Freq: Every day | ORAL | 1 refills | Status: DC

## 2021-12-12 NOTE — Progress Notes (Signed)
? ?      ?Jake [name]    [id number]    [birth year] ? ?Primary Care Physician:Breeback, Jade L, PA-C ? ?Referring Physician: Donella Stade, PA-C ?[address] ?[address] ?[place],  [place] [address] ? ?Chief complaint:  ?Shortness of breath ? ?HPI: ? ?He is on oxygen around-the-clock ?Continues to have significant shortness of breath with activity ? ?Lately has noted that he also gets short of breath even at rest ?Has not had any significant improvement in his symptoms ? ?Some activities and not associated with significant shortness of breath ? 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 ?Denies any wheezing ?Denies any pain or discomfort ?Did have tachycardia with albuterol use ? ?Echocardiogram reviewed with the patient showing decreased ejection fraction ?-Repeat echocardiogram did reveal improvement in his ejection fraction to 40 to 45% ?-Following up with cardiology ? ?CT scan of the chest compared to a year ago does show an interstitial process-hypersensitivity pneumonitis is a differential ? ?Exercise stress test did show some pulmonary limitation ? ?Himself and his spouse are still concerned about his shortness of breath ?He says he is feeling a little bit better ? ?During his last visit ?Did follow-up with pulmonary at [facility] ?-PFT with mild restriction ?-Echo was normal ?-Stress test had to be done chemically but showed no significant finding ? ?Initially when symptoms started about a year and a half ago he did have an echocardiogram and chemical stress test which were unremarkable ? ?Himself and his spouse stated that about January 2020 they did have what was felt to be a upper respiratory tract infection that they both recovered from and its been since that illness that he has had persistent and progressive symptoms ? ?Quit smoking over 10 years ago, was smoking about 2 packs a day ?-Breathing study did not show evidence of  COPD ?-Did not see any improvement in his breathing with inhalers, albuterol does help a little bit ? ?Get short of breath with less than 50 yards ? ?He does have a dry cough ? ?Occupational history ?-Some exposure to asbestos in the building for few years, was not working with asbestos ?Worked at [employer], office work ? ?History of obstructive sleep apnea that is will body by myself been like that latest he is leaving okay controlled ?Compliant ?-On auto titrating CPAP ?-CPAP helps with his sleep ? ?No significant musculoskeletal pain contributing to his limitations ? ?He does have oxygen tanks at home, does not use them all the time ? ?Has not been able to exercise regularly as he gets very winded easily ? ?Outpatient Encounter Medications as of 12/12/2021  ?Medication Sig  ? albuterol (VENTOLIN HFA) 108 (90 Base) MCG/ACT inhaler INHALE 2 PUFFS BY MOUTH EVERY 6 HOURS AS NEEDED  ? AMBULATORY NON FORMULARY MEDICATION Continuous positive airway pressure (CPAP) machine set at autotitration of H2O pressure, with all supplemental supplies as needed.  ? chlorthalidone (HYGROTON) 25 MG tablet Take 1 tablet (25 mg total) by mouth daily.  ? colestipol (COLESTID) 1 g tablet Take 1 tablet (1 g total) by mouth daily.  ? cyclobenzaprine (FLEXERIL) 10 MG tablet Take 1 tablet (10 mg total) by mouth 3 (three) times daily as needed for muscle spasms. (Patient taking differently: Take 10 mg by mouth 2 (two) times daily.)  ? dicyclomine (BENTYL) 20 MG tablet TAKE 1 TABLET BY MOUTH THREE TIMES DAILY  ? ENTRESTO 49-51 MG Take 1 tablet  by mouth 2 (two) times daily.  ? finasteride (PROSCAR) 5 MG tablet Take 1 tablet (5 mg total) by mouth daily.  ? ibuprofen (ADVIL) 800 MG tablet TAKE 1 TABLET BY MOUTH EVERY 8 HOURS AS NEEDED  ? loratadine (CLARITIN) 10 MG tablet Take 10 mg by mouth daily.  ? metoprolol succinate (TOPROL-XL) 50 MG 24 hr tablet Take 50 mg by mouth daily.  ? modafinil (PROVIGIL) 200 MG tablet Take 1 tablet (200 mg total) by  mouth daily.  ? Omega-3 Fatty Acids (FISH OIL PO) Take 1,400 mg by mouth daily.  ? ondansetron (ZOFRAN) 8 MG tablet TAKE 1 TABLET BY MOUTH EVERY 8 HOURS AS NEEDED FOR NAUSEA FOR VOMITING  ? OVER THE COUNTER MEDICATION Place 1 spray into both nostrils daily. Triamcinolone acetonide Nasal spray  ? pantoprazole (PROTONIX) 40 MG tablet Take 1 tablet (40 mg total) by mouth 2 (two) times daily.  ? pseudoephedrine-acetaminophen (TYLENOL SINUS) 30-500 MG TABS tablet Take 2 tablets by mouth every 4 (four) hours as needed (Sinus).  ? sertraline (ZOLOFT) 100 MG tablet TAKE 1 & 1/2 (ONE & ONE-HALF) TABLETS BY MOUTH ONCE DAILY  ? Sod Picosulfate-Mag Ox-Cit Acd (CLENPIQ) 10-3.5-12 MG-GM -GM/160ML SOLN Take 1 kit by mouth as directed.  ? ZYPITAMAG 2 MG TABS TAKE 1 TABLET BY MOUTH DAILY.  ? ?No facility-administered encounter medications on file as of 12/12/2021.  ? ? ?Allergies as of 12/12/2021 - Review Complete 12/12/2021  ?Allergen Reaction Noted  ? Fentanyl Other (See Comments) 04/06/2018  ? Other  04/06/2018  ? Aspirin Nausea And Vomiting 08/29/2015  ? Atorvastatin Nausea And Vomiting 11/03/2019  ? Prednisone  09/27/2019  ? Contrast media [iodinated contrast media] Palpitations 05/04/2016  ? Nitroglycerin Nausea And Vomiting 05/04/2016  ? ? ?Past Medical History:  ?Diagnosis Date  ? Arthritis   ? lower back  ? Bipolar disorder ([place])   ? GERD (gastroesophageal reflux disease)   ? Hyperlipidemia   ? Hypertension   ? 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 AVOID OPIATES!  ? ? ?Past Surgical History:  ?Procedure Laterality Date  ? ANKLE FRACTURE SURGERY Left   ? APPENDECTOMY    ? BIOPSY  11/18/2021  ? Procedure: BIOPSY;  Surgeon: Lavena Bullion, DO;  Location: [facility];  Service: Gastroenterology;;  EGD and COLON  ? CERVICAL FUSION    ? CHOLECYSTECTOMY    ? COLON SURGERY    ? COLONOSCOPY WITH PROPOFOL N/A 11/18/2021  ? Procedure: COLONOSCOPY WITH PROPOFOL;  Surgeon: Lavena Bullion, DO;  Location: [facility];   Service: Gastroenterology;  Laterality: N/A;  ? ESOPHAGOGASTRODUODENOSCOPY (EGD) WITH PROPOFOL N/A 11/18/2021  ? Procedure: ESOPHAGOGASTRODUODENOSCOPY (EGD) WITH PROPOFOL;  Surgeon: Lavena Bullion, DO;  Location: [facility];  Service: Gastroenterology;  Laterality: N/A;  ? HERNIA REPAIR    ? POLYPECTOMY  11/18/2021  ? Procedure: POLYPECTOMY;  Surgeon: Lavena Bullion, DO;  Location: [facility];  Service: Gastroenterology;;  ? ? ?Family History  ?Problem Relation Age of Onset  ? Cancer Mother   ?     breast  ? Depression Mother   ? Hyperlipidemia Mother   ? Dementia Mother   ? Alcohol abuse Father   ? Cancer Father   ?     skin  ? Hyperlipidemia Father   ? Alzheimer's disease Father   ? Cancer Maternal Grandfather   ? Cancer Paternal Grandmother   ? Pancreatic cancer Paternal Uncle   ? Colon cancer Neg Hx   ? Esophageal cancer  Neg Hx   ? Stomach cancer Neg Hx   ? ? ?Social History  ? ?Socioeconomic History  ? Marital status: Married  ?  Spouse name: Dawn  ? Number of children: 1  ? Years of education: 43  ? Highest education level: Not on file  ?Occupational History  ? Occupation: Disabled  ?Tobacco Use  ? Smoking status: Former  ?  Packs/day: 2.00  ?  Years: 25.00  ?  Pack years: 50.00  ?  Types: Cigarettes  ?  Start date: 55  ?  Quit date: 2010  ?  Years since quitting: 13.2  ? Smokeless tobacco: Never  ?Vaping Use  ? Vaping Use: Never used  ?Substance and Sexual Activity  ? Alcohol use: No  ? Drug use: No  ?  Comment: 04/06/2018 Patient's wife wanted [place] to be very aware that her husband is a recovering narcotic abuser  ? Sexual activity: Yes  ?Other Topics Concern  ? Not on file  ?Social History Narrative  ? Lives with his wife. Likes to spend to time with grandchildren. Does not exercise due to back issues in the past and respiratory problems. Daily caffeine use 2-3 drinks a day.   ? ?Social Determinants of Health  ? ?Financial Resource Strain: Not on file  ?Food Insecurity: Not on file  ?Transportation  Needs: Not on file  ?Physical Activity: Not on file  ?Stress: Not on file  ?Social Connections: Not on file  ?Intimate Partner Violence: Not on file  ? ? ?Review of Systems  ?Constitutional:  [place]

## 2021-12-12 NOTE — Patient Instructions (Signed)
We will start on prednisone 40 mg daily ? ?-40 daily for about 4 weeks then go to 20 mg daily ? ?You may stop the prednisone if you are having significant side effects from it, other option would be to go to a lower dose which will be going down from 40-20 and see if you tolerate that better ? ?CT scan of the chest without contrast-high-resolution CT before visit in 4 weeks ? ?Pulmonary function test can be done on the day of visit ? ?Call with significant concerns ? ?I will see you in 4 weeks ?

## 2021-12-16 ENCOUNTER — Other Ambulatory Visit: Payer: Self-pay | Admitting: Physician Assistant

## 2021-12-16 DIAGNOSIS — N401 Enlarged prostate with lower urinary tract symptoms: Secondary | ICD-10-CM

## 2021-12-17 ENCOUNTER — Other Ambulatory Visit: Payer: Self-pay | Admitting: Neurology

## 2021-12-17 DIAGNOSIS — R110 Nausea: Secondary | ICD-10-CM

## 2021-12-17 MED ORDER — ONDANSETRON HCL 8 MG PO TABS: ORAL_TABLET | ORAL | 2 refills | Status: DC

## 2021-12-17 NOTE — Progress Notes (Signed)
RX requested to be sent to [facility], having trouble transferring RX from [place]. RX sent.  ?

## 2021-12-23 DIAGNOSIS — G4733 Obstructive sleep apnea (adult) (pediatric): Secondary | ICD-10-CM | POA: Diagnosis not present

## 2021-12-24 ENCOUNTER — Ambulatory Visit (INDEPENDENT_AMBULATORY_CARE_PROVIDER_SITE_OTHER): Payer: Medicare HMO | Admitting: Physician Assistant

## 2021-12-24 VITALS — BP 129/81 | HR 81 | Ht 71.0 in | Wt 236.0 lb

## 2021-12-24 DIAGNOSIS — M7582 Other shoulder lesions, left shoulder: Secondary | ICD-10-CM | POA: Diagnosis not present

## 2021-12-24 DIAGNOSIS — M5432 Sciatica, left side: Secondary | ICD-10-CM | POA: Diagnosis not present

## 2021-12-24 DIAGNOSIS — K580 Irritable bowel syndrome with diarrhea: Secondary | ICD-10-CM

## 2021-12-24 MED ORDER — KETOROLAC TROMETHAMINE 60 MG/2ML IM SOLN
60.0000 mg | Freq: Once | INTRAMUSCULAR | Status: AC
  Administered 2021-12-24: 60 mg via INTRAMUSCULAR

## 2021-12-24 MED ORDER — DICYCLOMINE HCL 20 MG PO TABS: 20.0000 mg | ORAL_TABLET | Freq: Three times a day (TID) | ORAL | 0 refills | Status: DC

## 2021-12-24 NOTE — Patient Instructions (Addendum)
Sciatica Rehab Ask your health care provider which exercises are safe for you. Do exercises exactly as told by your health care provider and adjust them as directed. It is normal to feel mild stretching, pulling, tightness, or discomfort as you do these exercises. Stop right away if you feel sudden pain or your pain gets worse. Do not begin these exercises until told by your health care provider. Stretching and range-of-motion exercises These exercises warm up your muscles and joints and improve the movement and flexibility of your hips and back. These exercises also help to relieve pain, numbness, and tingling. Sciatic nerve glide Sit in a chair with your head facing down toward your chest. Place your hands behind your back. Let your shoulders slump forward. Slowly straighten one of your legs while you tilt your head back as if you are looking toward the ceiling. Only straighten your leg as far as you can without making your symptoms worse. Hold this position for __________ seconds. Slowly return to the starting position. Repeat with your other leg. Repeat __________ times. Complete this exercise __________ times a day. Knee to chest with hip adduction and internal rotation  Lie on your back on a firm surface with both legs straight. Bend one of your knees and move it up toward your chest until you feel a gentle stretch in your lower back and buttock. Then, move your knee toward the shoulder that is on the opposite side from your leg. This is hip adduction and internal rotation. Hold your leg in this position by holding on to the front of your knee. Hold this position for __________ seconds. Slowly return to the starting position. Repeat with your other leg. Repeat __________ times. Complete this exercise __________ times a day. Prone extension on elbows  Lie on your abdomen on a firm surface. A bed may be too soft for this exercise. Prop yourself up on your elbows. Use your arms to help  lift your chest up until you feel a gentle stretch in your abdomen and your lower back. This will place some of your body weight on your elbows. If this is uncomfortable, try stacking pillows under your chest. Your hips should stay down, against the surface that you are lying on. Keep your hip and back muscles relaxed. Hold this position for __________ seconds. Slowly relax your upper body and return to the starting position. Repeat __________ times. Complete this exercise __________ times a day. Strengthening exercises These exercises build strength and endurance in your back. Endurance is the ability to use your muscles for a long time, even after they get tired. Pelvic tilt This exercise strengthens the muscles that lie deep in the abdomen. Lie on your back on a firm surface. Bend your knees and keep your feet flat on the floor. Tense your abdominal muscles. Tip your pelvis up toward the ceiling and flatten your lower back into the floor. To help with this exercise, you may place a small towel under your lower back and try to push your back into the towel. Hold this position for __________ seconds. Let your muscles relax completely before you repeat this exercise. Repeat __________ times. Complete this exercise __________ times a day. Alternating arm and leg raises  Get on your hands and knees on a firm surface. If you are on a hard floor, you may want to use padding, such as an exercise mat, to cushion your knees. Line up your arms and legs. Your hands should be directly below your shoulders,   and your knees should be directly below your hips. ?Lift your left leg behind you. At the same time, raise your right arm and straighten it in front of you. ?Do not lift your leg higher than your hip. ?Do not lift your arm higher than your shoulder. ?Keep your abdominal and back muscles tight. ?Keep your hips facing the ground. ?Do not arch your back. ?Keep your balance carefully, and do not hold your  breath. ?Hold this position for __________ seconds. ?Slowly return to the starting position. ?Repeat with your right leg and your left arm. ?Repeat __________ times. Complete this exercise __________ times a day. ?Posture and body mechanics ?Good posture and healthy body mechanics can help to relieve stress in your body's tissues and joints. Body mechanics refers to the movements and positions of your body while you do your daily activities. Posture is part of body mechanics. Good posture means: ?Your spine is in its natural S-curve position (neutral). ?Your shoulders are pulled back slightly. ?Your head is not tipped forward. ?Follow these guidelines to improve your posture and body mechanics in your everyday activities. ?Standing ? ?When standing, keep your spine neutral and your feet about hip width apart. Keep a slight bend in your knees. Your ears, shoulders, and hips should line up. ?When you do a task in which you stand in one place for a long time, place one foot up on a stable object that is 2-4 inches (5-10 cm) high, such as a footstool. This helps keep your spine neutral. ?Sitting ? ?When sitting, keep your spine neutral and keep your feet flat on the floor. Use a footrest, if necessary, and keep your thighs parallel to the floor. Avoid rounding your shoulders, and avoid tilting your head forward. ?When working at a desk or a computer, keep your desk at a height where your hands are slightly lower than your elbows. Slide your chair under your desk so you are close enough to maintain good posture. ?When working at a computer, place your monitor at a height where you are looking straight ahead and you do not have to tilt your head forward or downward to look at the screen. ?Resting ?When lying down and resting, avoid positions that are most painful for you. ?If you have pain with activities such as sitting, bending, stooping, or squatting, lie in a position in which your body does not bend very much. For  example, avoid curling up on your side with your arms and knees near your chest (fetal position). ?If you have pain with activities such as standing for a long time or reaching with your arms, lie with your spine in a neutral position and bend your knees slightly. Try the following positions: ?Lying on your side with a pillow between your knees. ?Lying on your back with a pillow under your knees. ?Lifting ? ?When lifting objects, keep your feet at least shoulder width apart and tighten your abdominal muscles. ?Bend your knees and hips and keep your spine neutral. It is important to lift using the strength of your legs, not your back. Do not lock your knees straight out. ?Always ask for help to lift heavy or awkward objects. ?This information is not intended to replace advice given to you by your health care provider. Make sure you discuss any questions you have with your health care provider. ?Document Revised: 12/16/2018 Document Reviewed: 09/15/2018 ?Elsevier Patient Education ? [place]. ? ? ?Rotator Cuff Tendinitis ? ?Rotator cuff tendinitis is inflammation of the  tendons in the rotator cuff. Tendons are tough, cord-like bands that connect muscle to bone. The rotator cuff includes all of the muscles and tendons that connect the arm to the shoulder. The rotator cuff holds the head of the humerus, or the upper arm bone, in the cup of the shoulder blade (scapula). ?This condition can lead to a long-term or chronic tear. The tear may be partial or complete. ?What are the causes? ?This condition is usually caused by overusing the rotator cuff. ?What increases the risk? ?This condition is more likely to develop in athletes and workers who frequently use their shoulder or reach over their heads. This can include activities such as: ?Tennis. ?Baseball or softball. ?Swimming. ?Architect work. ?Painting. ?What are the signs or symptoms? ?Symptoms of this condition include: ?Pain that spreads (radiates) from  the shoulder to the upper arm. ?Swelling and tenderness in front of the shoulder. ?Pain when reaching, pulling, or lifting the arm above the head. ?Pain when lowering the arm from above the head. ?Minor pain in

## 2021-12-24 NOTE — Progress Notes (Signed)
? ?Acute Office Visit ? ?Subjective:  ? ?  ?Patient ID: Jake [name], male    DOB: Jul 12, 1961, 61 y.o.   MRN: [id number] ? ?Chief Complaint  ?Patient presents with  ? Shoulder Pain  ? ? ?HPI ?Patient is in today to discuss left shoulder pain and low back pain with radiation into left leg. On Sunday he woke up with the left shoulder pain so severe that he could not lift his shampoo bottle in the shower. He denies any known injury but he plays and lifts his grandkids a lot that are both under 2 yo. He has been using heat, ice, ibuprofen with a lot of improvement. His low back and sh ?.. ?Active Ambulatory Problems  ?  Diagnosis Date Noted  ? Low back pain 05/05/2016  ? Muscle spasm of back 05/05/2016  ? Hyperlipidemia 05/19/2016  ? Hypertriglyceridemia 05/19/2016  ? Diverticulosis of colon without hemorrhage 05/19/2016  ? IBS (irritable bowel syndrome) 05/19/2016  ? GERD (gastroesophageal reflux disease) 05/19/2016  ? Allergic rhinitis 05/19/2016  ? OSA on CPAP 05/19/2016  ? Generalized anxiety disorder 05/19/2016  ? Mood changes 05/19/2016  ? Atelectasis of both lungs 11/20/2016  ? 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[place]) 05/27/2017  ? History of pancreatitis 05/27/2017  ? Diverticulitis 05/27/2017  ? Elevated fasting glucose 06/10/2017  ? Low testosterone 06/10/2017  ? Benign prostatic hyperplasia (BPH) with urinary urgency 06/12/2017  ? No energy 06/12/2017  ? Former smoker 07/25/2017  ? Cough 07/25/2017  ? Low testosterone in male 07/25/2017  ? Non-restorative sleep 07/25/2017  ? Anxiety 10/30/2015  ? Chronic nausea 10/30/2015  ? Major depressive disorder 10/30/2015  ? Migraine 10/30/2015  ? Neck pain 10/30/2015  ? Occipital neuralgia 10/30/2015  ? Substance abuse in remission ([place]) 04/07/2018  ? Bipolar disorder ([place]) 07/04/2018  ? Hearing difficulty of right ear 01/16/2019  ? IFG (impaired fasting glucose) 03/20/2019  ? Coronary artery disease due to calcified coronary lesion 11/03/2019  ? Thoracic aorta atherosclerosis ([place])  11/03/2019  ? Aortic atherosclerosis ([place]) 11/03/2019  ? Hepatic steatosis 11/03/2019  ? SOB (shortness of breath) on exertion 11/06/2019  ? Precordial pain 11/06/2019  ? Essential hypertension 11/06/2019  ? Obesity (BMI 30-39.9) 11/06/2019  ? Aneurysm of ascending aorta (H[facility]) 11/10/2019  ? Hypoxia 02/21/2020  ? 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/2020  ? Sinobronchitis 07/10/2020  ? Decreased cardiac ejection fraction 11/14/2020  ? Cardiomyopathy ([place]) 11/14/2020  ? Dyspnea 11/14/2020  ? Left lower quadrant abdominal pain 04/11/2021  ? Chronic systolic congestive heart failure, NYHA class 3 ([place]) 05/30/2021  ? IPF (idiopathic pulmonary fibrosis) ([place]) 06/10/2021  ? Pre-diabetes 06/10/2021  ? Class 1 obesity due to excess calories with serious comorbidity and body mass index (BMI) of 34.0 to 34.9 in adult 06/10/2021  ? Cirrhosis of liver without ascites ([place]) 06/15/2021  ? Dysfunction of both eustachian tubes 07/08/2021  ? Eye discharge 08/11/2021  ? Photophobia of both eyes 08/11/2021  ? Eye pain, bilateral 08/11/2021  ? Diarrhea 09/05/2021  ? Abdominal distention 09/05/2021  ? Generalized abdominal pain 09/05/2021  ? Suspicious nevus 11/11/2021  ? Change in bowel habits   ? Gastritis and gastroduodenitis   ? Colon cancer screening   ? Hematochezia   ? Grade II internal hemorrhoids   ? Adenomatous polyp of sigmoid colon   ? Rotator cuff tendonitis, left 12/24/2021  ? Sciatica of left side 12/24/2021  ? ?Resolved Ambulatory Problems  ?  Diagnosis Date Noted  ? Essential hypertension, benign 05/19/2016  ? Community acquired  pneumonia of left lower lobe of lung 11/20/2016  ? Impacted cerumen of right ear 01/16/2019  ? ?Past Medical History:  ?Diagnosis Date  ? Arthritis   ? Hypertension   ? ? ? ?ROS ? ?See HPI.  ?   ?Objective:  ?  ?BP 129/81   Pulse 81   Ht '5\' 11"'$  (1.803 m)   Wt 236 lb (107 kg)   SpO2 95%   BMI 32.92 kg/m?  ?BP Readings from Last 3 Encounters:  ?12/24/21 129/81  ?12/12/21 124/82  ?11/18/21 107/61   ? ?  ? ?Physical Exam ?Vitals reviewed.  ?Constitutional:   ?   Appearance: Normal appearance. He is obese.  ?HENT:  ?   Head: Normocephalic.  ?Cardiovascular:  ?   Rate and Rhythm: Normal rate and regular rhythm.  ?   Pulses: Normal pulses.  ?Pulmonary:  ?   Comments: On 2L of O2.  ?Musculoskeletal:  ?   Right lower leg: No edema.  ?   Left lower leg: No edema.  ?   Comments: Left shoulder:  ?Full ROM but some pain with Abduction ?4/5 strength left upper extremity ?Negative drop arm ?Hand grip 5/5 ?Pain with external ROM ?No tenderness over left shoulder landmarks ? ?No tenderness over lumbar spine ?Negative SLR, left ?5/5 lower ext strength ?  ?Neurological:  ?   General: No focal deficit present.  ?   Mental Status: He is alert and oriented to person, place, and time.  ?Psychiatric:     ?   Mood and Affect: Mood normal.  ? ? ? ? ?   ?Assessment & Plan:  ?..[name] was seen today for shoulder pain. ? ?Diagnoses and all orders for this visit: ? ?Rotator cuff tendonitis, left ?-     ketorolac (TORADOL) injection 60 mg ? ?Irritable bowel syndrome with diarrhea ?-     dicyclomine (BENTYL) 20 MG tablet; Take 1 tablet (20 mg total) by mouth 3 (three) times daily. ? ?Sciatica of left side ?-     ketorolac (TORADOL) injection 60 mg ? ? ?Treated for rotator cuff tendonitis and sciatica ?Gave exercises and HO ?Toradol in office ?Tylenol or ibuprofen for pain ?Continue icing and consider icy hot patches or rubs ? ? ?[name], PA-C ? ? ?

## 2021-12-25 DIAGNOSIS — R0902 Hypoxemia: Secondary | ICD-10-CM | POA: Diagnosis not present

## 2021-12-25 DIAGNOSIS — I10 Essential (primary) hypertension: Secondary | ICD-10-CM | POA: Diagnosis not present

## 2021-12-26 ENCOUNTER — Encounter: Payer: Self-pay | Admitting: Physician Assistant

## 2021-12-30 DIAGNOSIS — G4733 Obstructive sleep apnea (adult) (pediatric): Secondary | ICD-10-CM | POA: Diagnosis not present

## 2022-01-12 ENCOUNTER — Other Ambulatory Visit: Payer: Self-pay | Admitting: Physician Assistant

## 2022-01-12 DIAGNOSIS — I10 Essential (primary) hypertension: Secondary | ICD-10-CM

## 2022-01-12 DIAGNOSIS — N401 Enlarged prostate with lower urinary tract symptoms: Secondary | ICD-10-CM

## 2022-01-12 NOTE — Progress Notes (Signed)
HPI: Follow-up nonischemic cardiomyopathy.  Previously followed by Dr. Harriet Masson and at [facility]. Nuclear study March 2021 showed ejection fraction 54% and normal perfusion.  CPX March 2022 showed submaximal effort, moderate functional impairment due to pulmonary restriction.  High resolution chest CT March 2022 showed interstitial lung disease and aortic atherosclerosis; enlarged pulmonary trunk suggestive of pulmonary hypertension.  Cardiac catheterization March 2022 showed normal left main, minimal irregularities in the LAD, 2 small diagonals, circumflex, 2 obtuse marginals, posterior lateral, PDA and nondominant right coronary artery; ejection fraction 30 to 35% with mild left ventricular enlargement.  Chest CT December 2022 showed cardiomegaly, coronary calcification, aortic root measuring 4.2 cm, 4.1 cm ascending aorta.  Echocardiogram December 2022 showed ejection fraction 40 to 45%, mild left atrial enlargement, mild mitral regurgitation.  Since last seen he has increasing dyspnea on exertion.  He denies orthopnea, PND, pedal edema, exertional chest pain.  He recently was driving back from the beach and sounds to have had a syncopal episode.  He was off the road and his wife screamed and he awoke.  No preceding chest pain, palpitations, increased dyspnea or nausea.  No incontinence.  Current Outpatient Medications  Medication Sig Dispense Refill   albuterol (VENTOLIN HFA) 108 (90 Base) MCG/ACT inhaler INHALE 2 PUFFS BY MOUTH EVERY 6 HOURS AS NEEDED 18 g 0   AMBULATORY NON FORMULARY MEDICATION Continuous positive airway pressure (CPAP) machine set at autotitration of H2O pressure, with all supplemental supplies as needed. 1 each 0   chlorthalidone (HYGROTON) 25 MG tablet TAKE 1 TABLET BY MOUTH EVERY DAY 90 tablet 1   colestipol (COLESTID) 1 g tablet Take 1 tablet (1 g total) by mouth daily. 90 tablet 1   dicyclomine (BENTYL) 20 MG tablet Take 1 tablet (20 mg total) by mouth 3 (three) times daily.  270 tablet 0   ENTRESTO 49-51 MG Take 1 tablet by mouth 2 (two) times daily.     finasteride (PROSCAR) 5 MG tablet TAKE 1 TABLET (5 MG TOTAL) BY MOUTH DAILY. 90 tablet 0   fluticasone (FLONASE) 50 MCG/ACT nasal spray Place into both nostrils daily.     ibuprofen (ADVIL) 800 MG tablet TAKE 1 TABLET BY MOUTH EVERY 8 HOURS AS NEEDED 90 tablet 5   loratadine (CLARITIN) 10 MG tablet Take 10 mg by mouth daily.     metoprolol succinate (TOPROL-XL) 50 MG 24 hr tablet Take 50 mg by mouth daily.     modafinil (PROVIGIL) 200 MG tablet Take 1 tablet (200 mg total) by mouth daily. 30 tablet 5   Omega-3 Fatty Acids (FISH OIL PO) Take 1,400 mg by mouth daily.     ondansetron (ZOFRAN) 8 MG tablet TAKE 1 TABLET BY MOUTH EVERY 8 HOURS AS NEEDED FOR NAUSEA FOR VOMITING 90 tablet 2   OVER THE COUNTER MEDICATION Place 1 spray into both nostrils daily. Triamcinolone acetonide Nasal spray     pantoprazole (PROTONIX) 40 MG tablet Take 1 tablet (40 mg total) by mouth 2 (two) times daily. 180 tablet 1   pseudoephedrine-acetaminophen (TYLENOL SINUS) 30-500 MG TABS tablet Take 2 tablets by mouth every 4 (four) hours as needed (Sinus).     sertraline (ZOLOFT) 100 MG tablet TAKE 1 & 1/2 (ONE & ONE-HALF) TABLETS BY MOUTH ONCE DAILY 135 tablet 1   ZYPITAMAG 2 MG TABS TAKE 1 TABLET BY MOUTH DAILY. 30 tablet 5   No current facility-administered medications for this visit.     Past Medical History:  Diagnosis Date  Arthritis    lower back   Bipolar disorder (HCC)    GERD (gastroesophageal reflux disease)    Hyperlipidemia    Hypertension    Substance abuse in remission ([place]) 04/06/2018   AVOID OPIATES!    Past Surgical History:  Procedure Laterality Date   ANKLE FRACTURE SURGERY Left    APPENDECTOMY     BIOPSY  11/18/2021   Procedure: BIOPSY;  Surgeon: Lavena Bullion, DO;  Location: [facility];  Service: Gastroenterology;;  EGD and COLON   CERVICAL FUSION     CHOLECYSTECTOMY     COLON SURGERY      COLONOSCOPY WITH PROPOFOL N/A 11/18/2021   Procedure: COLONOSCOPY WITH PROPOFOL;  Surgeon: Lavena Bullion, DO;  Location: [facility];  Service: Gastroenterology;  Laterality: N/A;   ESOPHAGOGASTRODUODENOSCOPY (EGD) WITH PROPOFOL N/A 11/18/2021   Procedure: ESOPHAGOGASTRODUODENOSCOPY (EGD) WITH PROPOFOL;  Surgeon: Lavena Bullion, DO;  Location: [facility];  Service: Gastroenterology;  Laterality: N/A;   HERNIA REPAIR     POLYPECTOMY  11/18/2021   Procedure: POLYPECTOMY;  Surgeon: Lavena Bullion, DO;  Location: [facility];  Service: Gastroenterology;;    Social History   Socioeconomic History   Marital status: Married    Spouse name: Dawn   Number of children: 1   Years of education: 14   Highest education level: Not on file  Occupational History   Occupation: Disabled  Tobacco Use   Smoking status: Former    Packs/day: 2.00    Years: 25.00    Pack years: 50.00    Types: Cigarettes    Start date: 1982    Quit date: 2010    Years since quitting: 13.3   Smokeless tobacco: Never  Vaping Use   Vaping Use: Never used  Substance and Sexual Activity   Alcohol use: No   Drug use: No    Comment: 04/06/2018 Patient's wife wanted [place] to be very aware that her husband is a recovering narcotic abuser   Sexual activity: Yes  Other Topics Concern   Not on file  Social History Narrative   Lives with his wife. Likes to spend to time with grandchildren. Does not exercise due to back issues in the past and respiratory problems. Daily caffeine use 2-3 drinks a day.    Social Determinants of Health   Financial Resource Strain: Not on file  Food Insecurity: Not on file  Transportation Needs: Not on file  Physical Activity: Not on file  Stress: Not on file  Social Connections: Not on file  Intimate Partner Violence: Not on file    Family History  Problem Relation Age of Onset   Cancer Mother        breast   Depression Mother    Hyperlipidemia Mother    Dementia Mother     Alcohol abuse Father    Cancer Father        skin   Hyperlipidemia Father    Alzheimer's disease Father    Cancer Maternal Grandfather    Cancer Paternal Grandmother    Pancreatic cancer Paternal Uncle    Colon cancer Neg Hx    Esophageal cancer Neg Hx    Stomach cancer Neg Hx     ROS: no fevers or chills, productive cough, hemoptysis, dysphasia, odynophagia, melena, hematochezia, dysuria, hematuria, rash, seizure activity, orthopnea, PND, pedal edema, claudication. Remaining systems are negative.  Physical Exam: Well-developed well-nourished in no acute distress.  Skin is warm and dry.  HEENT is normal.  Neck is supple.  Chest is clear to auscultation with normal expansion.  Cardiovascular exam is regular rate and rhythm.  Abdominal exam nontender or distended. No masses palpated. Extremities show no edema. neuro grossly intact  Electrocardiogram-personally reviewed.  This shows sinus rhythm with left bundle branch block.  Note his left bundle branch block has been present intermittently in the past.  A/P  1 nonischemic cardiomyopathy-plan to continue present dose of Entresto and Toprol.  2 chronic combined systolic/diastolic congestive heart failure-patient remains euvolemic.  We will continue chlorthalidone at present dose.  He declined [place].  He continues to have dyspnea on exertion.  Previous CPX suggested limitation due to pulmonary restriction.  I think this is likely interstitial lung disease causing most of his dyspnea.  He is scheduled to see Dr. Chase Caller in the near future.  If he thinks a right heart catheterization would be beneficial we can arrange.  3 hypertension-patient's blood pressure is controlled.  Continue present medications.  4 thoracic aortic aneurysm-plan follow-up CTA December 2023.  5 coronary calcification-intolerant to statins.    6 hyperlipidemia-patient is intolerant to statins.  Will consider different medication in the future once his  lung disease is addressed.  7 obstructive sleep apnea-continue CPAP.  8 recent syncope-etiology unclear.  He has had some dizziness at times with ambulation.  I have asked him to check his blood pressure and saturations during these episodes.  He thinks that his saturations have been low in the past with these episodes.  I have also asked him not to drive for at least 6 months.  Note his ejection fraction is 40 to 45%.  Kirk Ruths, MD

## 2022-01-15 ENCOUNTER — Ambulatory Visit (INDEPENDENT_AMBULATORY_CARE_PROVIDER_SITE_OTHER): Payer: Medicare HMO | Admitting: Pulmonary Disease

## 2022-01-15 DIAGNOSIS — R0602 Shortness of breath: Secondary | ICD-10-CM

## 2022-01-15 LAB — PULMONARY FUNCTION TEST
DL/VA % pred: 105 %
DL/VA: 4.43 ml/min/mmHg/L
DLCO cor % pred: 49 %
DLCO cor: 14.05 ml/min/mmHg
DLCO unc % pred: 49 %
DLCO unc: 14.05 ml/min/mmHg
FEF 25-75 Post: 3.95 L/sec
FEF 25-75 Pre: 4.24 L/sec
FEF2575-%Change-Post: -6 %
FEF2575-%Pred-Post: 130 %
FEF2575-%Pred-Pre: 140 %
FEV1-%Change-Post: -2 %
FEV1-%Pred-Post: 48 %
FEV1-%Pred-Pre: 49 %
FEV1-Post: 1.78 L
FEV1-Pre: 1.83 L
FEV1FVC-%Change-Post: -5 %
FEV1FVC-%Pred-Pre: 126 %
FEV6-%Change-Post: 3 %
FEV6-%Pred-Post: 42 %
FEV6-%Pred-Pre: 40 %
FEV6-Post: 1.97 L
FEV6-Pre: 1.91 L
FEV6FVC-%Pred-Post: 104 %
FEV6FVC-%Pred-Pre: 104 %
FVC-%Change-Post: 3 %
FVC-%Pred-Post: 40 %
FVC-%Pred-Pre: 38 %
FVC-Post: 1.97 L
FVC-Pre: 1.91 L
Post FEV1/FVC ratio: 90 %
Post FEV6/FVC ratio: 100 %
Pre FEV1/FVC ratio: 96 %
Pre FEV6/FVC Ratio: 100 %
RV % pred: 60 %
RV: 1.4 L
TLC % pred: 60 %
TLC: 4.36 L

## 2022-01-15 NOTE — Progress Notes (Signed)
PFT done today. 

## 2022-01-16 ENCOUNTER — Ambulatory Visit (INDEPENDENT_AMBULATORY_CARE_PROVIDER_SITE_OTHER)
Admission: RE | Admit: 2022-01-16 | Discharge: 2022-01-16 | Disposition: A | Discharge status: Home / Self Care | Payer: Medicare HMO | Source: Ambulatory Visit | Attending: Pulmonary Disease | Admitting: Pulmonary Disease

## 2022-01-16 DIAGNOSIS — R0602 Shortness of breath: Secondary | ICD-10-CM

## 2022-01-16 DIAGNOSIS — I2510 Atherosclerotic heart disease of native coronary artery without angina pectoris: Secondary | ICD-10-CM | POA: Diagnosis not present

## 2022-01-16 DIAGNOSIS — J479 Bronchiectasis, uncomplicated: Secondary | ICD-10-CM | POA: Diagnosis not present

## 2022-01-16 DIAGNOSIS — I7789 Other specified disorders of arteries and arterioles: Secondary | ICD-10-CM | POA: Diagnosis not present

## 2022-01-16 DIAGNOSIS — J84112 Idiopathic pulmonary fibrosis: Secondary | ICD-10-CM | POA: Diagnosis not present

## 2022-01-19 ENCOUNTER — Encounter: Payer: Self-pay | Admitting: Pulmonary Disease

## 2022-01-19 ENCOUNTER — Ambulatory Visit: Payer: Medicare HMO | Admitting: Pulmonary Disease

## 2022-01-19 VITALS — BP 110/70 | HR 83 | Temp 97.6°F | Ht 71.0 in | Wt 234.0 lb

## 2022-01-19 DIAGNOSIS — J84112 Idiopathic pulmonary fibrosis: Secondary | ICD-10-CM | POA: Diagnosis not present

## 2022-01-19 NOTE — Progress Notes (Signed)
? ?      ?Ervine Witucki    [id number]    1961-01-07 ? ?Primary Care Physician:Breeback, Jade L, PA-C ? ?Referring Physician: Donella Stade, PA-C ?[address] ?[address] ?[place],  [place] [address] ? ?Chief complaint:  ?Shortness of breath ?Shortness of breath is progressive ? ?HPI: ? ?Had an episode of syncope while driving ?Was not lightheaded, was not feeling short of breath prior to event ?Known to have obstructive sleep apnea for which he uses CPAP around-the-clock ?Was not having any sleep attacks ? ?He is on pulsed dose oxygen ?Describes sometimes that his saturations may be better on a different device that has continuous flow ? ?We had agreed on trying him on steroids during the last visit, he was encouraged to reduce the dose if not tolerated ?He was started on 40 mg of prednisone which after a few days he went down to 20 and currently not taking any ?Did not notice any significant improvement in his symptoms ?He could not tolerate being on steroids ? ?He did ask about steroid shots which his primary doctor had given him over time for other issues-he stated this was well-tolerated-administration appears to be related to having rhinosinusitis for which she did receive 125 mg of Solu-Medrol-last time was last year ? ?Shortness of breath at rest ? ?He continues to follow-up with cardiology, encouraged to make sure he keeps his upcoming appointment ? ?CT scan of the chest reviewed ?Had a repeat high-resolution CT-official reading is still pending however, does not appear ? ?Echocardiogram reviewed with the patient showing decreased ejection fraction ?-Repeat echocardiogram did reveal improvement in his ejection fraction to 40 to 45% ?-Following up with cardiology ? ?Exercise stress test did show some pulmonary limitation ? ?Remains very concerned about his breathing, spouse was present during the visit ? ?During his last visit ?Did follow-up with pulmonary at [facility] ?-PFT with mild  restriction ?-Echo was normal ?-Stress test had to be done chemically but showed no significant finding ? ?Initially when symptoms started about a year and a half ago he did have an echocardiogram and chemical stress test which were unremarkable ? ?Himself and his spouse stated that about January 2020 they did have what was felt to be a upper respiratory tract infection that they both recovered from and its been since that illness that he has had persistent and progressive symptoms ? ?Quit smoking over 10 years ago, was smoking about 2 packs a day ?-Breathing study did not show evidence of COPD ?-Did not see any improvement in his breathing with inhalers, albuterol does help a little bit ? ?Get short of breath with less than 50 yards ? ?He does have a dry cough ? ?Occupational history ?-Some exposure to asbestos in the building for few years, was not working with asbestos ?Worked at [employer], office work ? ?History of obstructive sleep apnea, he does have oxygen piped into the system ?-On auto titrating CPAP ?-CPAP helps with his sleep ? ?No significant musculoskeletal pain contributing to his limitations ? ?He does have oxygen tanks at home ? ?Outpatient Encounter Medications as of 01/19/2022  ?Medication Sig  ? albuterol (VENTOLIN HFA) 108 (90 Base) MCG/ACT inhaler INHALE 2 PUFFS BY MOUTH EVERY 6 HOURS AS NEEDED  ? AMBULATORY NON FORMULARY MEDICATION Continuous positive airway pressure (CPAP) machine set at autotitration of H2O pressure, with all supplemental supplies as needed.  ? chlorthalidone (HYGROTON) 25 MG tablet TAKE 1 TABLET BY MOUTH EVERY DAY  ?  colestipol (COLESTID) 1 g tablet Take 1 tablet (1 g total) by mouth daily.  ? dicyclomine (BENTYL) 20 MG tablet Take 1 tablet (20 mg total) by mouth 3 (three) times daily.  ? ENTRESTO 49-51 MG Take 1 tablet by mouth 2 (two) times daily.  ? finasteride (PROSCAR) 5 MG tablet TAKE 1 TABLET (5 MG TOTAL) BY MOUTH DAILY.  ? fluticasone (FLONASE) 50 MCG/ACT nasal spray  Place into both nostrils daily.  ? ibuprofen (ADVIL) 800 MG tablet TAKE 1 TABLET BY MOUTH EVERY 8 HOURS AS NEEDED  ? loratadine (CLARITIN) 10 MG tablet Take 10 mg by mouth daily.  ? metoprolol succinate (TOPROL-XL) 50 MG 24 hr tablet Take 50 mg by mouth daily.  ? modafinil (PROVIGIL) 200 MG tablet Take 1 tablet (200 mg total) by mouth daily.  ? Omega-3 Fatty Acids (FISH OIL PO) Take 1,400 mg by mouth daily.  ? ondansetron (ZOFRAN) 8 MG tablet TAKE 1 TABLET BY MOUTH EVERY 8 HOURS AS NEEDED FOR NAUSEA FOR VOMITING  ? OVER THE COUNTER MEDICATION Place 1 spray into both nostrils daily. Triamcinolone acetonide Nasal spray  ? pantoprazole (PROTONIX) 40 MG tablet Take 1 tablet (40 mg total) by mouth 2 (two) times daily.  ? pseudoephedrine-acetaminophen (TYLENOL SINUS) 30-500 MG TABS tablet Take 2 tablets by mouth every 4 (four) hours as needed (Sinus).  ? sertraline (ZOLOFT) 100 MG tablet TAKE 1 & 1/2 (ONE & ONE-HALF) TABLETS BY MOUTH ONCE DAILY  ? ZYPITAMAG 2 MG TABS TAKE 1 TABLET BY MOUTH DAILY.  ? [DISCONTINUED] cyclobenzaprine (FLEXERIL) 10 MG tablet Take 1 tablet (10 mg total) by mouth 3 (three) times daily as needed for muscle spasms. (Patient not taking: Reported on 01/19/2022)  ? [DISCONTINUED] predniSONE (DELTASONE) 20 MG tablet Take 2 tablets (40 mg total) by mouth daily with breakfast. (Patient not taking: Reported on 01/19/2022)  ? ?No facility-administered encounter medications on file as of 01/19/2022.  ? ? ?Allergies as of 01/19/2022 - Review Complete 01/19/2022  ?Allergen Reaction Noted  ? Fentanyl Other (See Comments) 04/06/2018  ? Other  04/06/2018  ? Aspirin Nausea And Vomiting 08/29/2015  ? Atorvastatin Nausea And Vomiting 11/03/2019  ? Prednisone  09/27/2019  ? Contrast media [iodinated contrast media] Palpitations 05/04/2016  ? Nitroglycerin Nausea And Vomiting 05/04/2016  ? ? ?Past Medical History:  ?Diagnosis Date  ? Arthritis   ? lower back  ? Bipolar disorder ([place])   ? GERD (gastroesophageal reflux  disease)   ? Hyperlipidemia   ? Hypertension   ? 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 AVOID OPIATES!  ? ? ?Past Surgical History:  ?Procedure Laterality Date  ? ANKLE FRACTURE SURGERY Left   ? APPENDECTOMY    ? BIOPSY  11/18/2021  ? Procedure: BIOPSY;  Surgeon: Lavena Bullion, DO;  Location: [facility];  Service: Gastroenterology;;  EGD and COLON  ? CERVICAL FUSION    ? CHOLECYSTECTOMY    ? COLON SURGERY    ? COLONOSCOPY WITH PROPOFOL N/A 11/18/2021  ? Procedure: COLONOSCOPY WITH PROPOFOL;  Surgeon: Lavena Bullion, DO;  Location: [facility];  Service: Gastroenterology;  Laterality: N/A;  ? ESOPHAGOGASTRODUODENOSCOPY (EGD) WITH PROPOFOL N/A 11/18/2021  ? Procedure: ESOPHAGOGASTRODUODENOSCOPY (EGD) WITH PROPOFOL;  Surgeon: Lavena Bullion, DO;  Location: [facility];  Service: Gastroenterology;  Laterality: N/A;  ? HERNIA REPAIR    ? POLYPECTOMY  11/18/2021  ? Procedure: POLYPECTOMY;  Surgeon: Lavena Bullion, DO;  Location: [facility];  Service: Gastroenterology;;  ? ? ?Family History  ?  Problem Relation Age of Onset  ? Cancer Mother   ?     breast  ? Depression Mother   ? Hyperlipidemia Mother   ? Dementia Mother   ? Alcohol abuse Father   ? Cancer Father   ?     skin  ? Hyperlipidemia Father   ? Alzheimer's disease Father   ? Cancer Maternal Grandfather   ? Cancer Paternal Grandmother   ? Pancreatic cancer Paternal Uncle   ? Colon cancer Neg Hx   ? Esophageal cancer Neg Hx   ? Stomach cancer Neg Hx   ? ? ?Social History  ? ?Socioeconomic History  ? Marital status: Married  ?  Spouse name: Dawn  ? Number of children: 1  ? Years of education: 91  ? Highest education level: Not on file  ?Occupational History  ? Occupation: Disabled  ?Tobacco Use  ? Smoking status: Former  ?  Packs/day: 2.00  ?  Years: 25.00  ?  Pack years: 50.00  ?  Types: Cigarettes  ?  Start date: 25  ?  Quit date: 2010  ?  Years since quitting: 13.3  ? Smokeless tobacco: Never  ?Vaping Use  ? Vaping Use: Never used   ?Substance and Sexual Activity  ? Alcohol use: No  ? Drug use: No  ?  Comment: 04/06/2018 Patient's wife wanted [place] to be very aware that her husband is a recovering narcotic abuser  ? Sexual activity: Yes  ?Other

## 2022-01-19 NOTE — Patient Instructions (Signed)
Appointment to see Dr. Chase Caller for interstitial lung disease ? ?Worsening breathing study ?Worsening oxygen saturations ? ?Blood work including BNP, ANA, rheumatoid factor, anti-Citrullinated peptide, SCL 70, CRP, ESR, antisynthetase, myositis panel. ? ?Ensure you use oxygen at continuous-flow ? ?Keep saturations above 90 ? ?Disability placard-this can be addressed by your primary doctor ? ?Continue using your CPAP nightly with oxygen supplementation ? ?Yes it is concerning that shortness of breath, pulmonary function is worsening- ?CT scan does not look that it is progressing rapidly but yes it is concerning ?-No evidence of cancer ? ?You should continue to follow-up with cardiology as well-heart failure can contribute to low oxygen and shortness of breath ?

## 2022-01-20 ENCOUNTER — Telehealth: Payer: Self-pay | Admitting: Pulmonary Disease

## 2022-01-21 NOTE — Telephone Encounter (Signed)
Called patient and he is wanting to know if he needs to fast before he gets the blood work that Dr Jenetta Downer ordered for him this week.  ? ?Patient is wanting to get labs done either today or tomorrow depending on if he needs to fast or not. ? ?Dr Jenetta Downer please advise  ?

## 2022-01-22 DIAGNOSIS — G4733 Obstructive sleep apnea (adult) (pediatric): Secondary | ICD-10-CM | POA: Diagnosis not present

## 2022-01-22 NOTE — Telephone Encounter (Signed)
Called and spoke with pt letting him know the info per AO and he verbalized understanding. Nothing further needed.

## 2022-01-22 NOTE — Telephone Encounter (Signed)
Does not need to fast for blood work

## 2022-01-24 DIAGNOSIS — R0902 Hypoxemia: Secondary | ICD-10-CM | POA: Diagnosis not present

## 2022-01-24 DIAGNOSIS — I10 Essential (primary) hypertension: Secondary | ICD-10-CM | POA: Diagnosis not present

## 2022-01-26 ENCOUNTER — Other Ambulatory Visit: Payer: Self-pay | Admitting: Pulmonary Disease

## 2022-01-26 ENCOUNTER — Encounter: Payer: Self-pay | Admitting: Cardiology

## 2022-01-26 ENCOUNTER — Ambulatory Visit: Payer: Medicare HMO | Admitting: Cardiology

## 2022-01-26 VITALS — BP 136/78 | HR 96 | Ht 71.0 in | Wt 234.1 lb

## 2022-01-26 DIAGNOSIS — E782 Mixed hyperlipidemia: Secondary | ICD-10-CM | POA: Diagnosis not present

## 2022-01-26 DIAGNOSIS — J84112 Idiopathic pulmonary fibrosis: Secondary | ICD-10-CM | POA: Diagnosis not present

## 2022-01-26 DIAGNOSIS — I5022 Chronic systolic (congestive) heart failure: Secondary | ICD-10-CM

## 2022-01-26 DIAGNOSIS — R55 Syncope and collapse: Secondary | ICD-10-CM | POA: Diagnosis not present

## 2022-01-26 DIAGNOSIS — I420 Dilated cardiomyopathy: Secondary | ICD-10-CM | POA: Diagnosis not present

## 2022-01-26 DIAGNOSIS — I10 Essential (primary) hypertension: Secondary | ICD-10-CM | POA: Diagnosis not present

## 2022-01-26 NOTE — Patient Instructions (Signed)
  Follow-Up: At [facility], you and your health needs are our priority.  As part of our continuing mission to provide you with exceptional heart care, we have created designated Provider Care Teams.  These Care Teams include your primary Cardiologist (physician) and Advanced Practice Providers (APPs -  Physician Assistants and Nurse Practitioners) who all work together to provide you with the care you need, when you need it.  We recommend signing up for the patient portal called "MyChart".  Sign up information is provided on this After Visit Summary.  MyChart is used to connect with patients for Virtual Visits (Telemedicine).  Patients are able to view lab/test results, encounter notes, upcoming appointments, etc.  Non-urgent messages can be sent to your provider as well.   To learn more about what you can do with MyChart, go to [email or web address].    Your next appointment:   8 week(s)  The format for your next appointment:   In Person  Provider:   Kirk Ruths, MD      Important Information About Sugar

## 2022-01-27 LAB — BRAIN NATRIURETIC PEPTIDE: Brain Natriuretic Peptide: 31 pg/mL (ref <100)

## 2022-01-27 LAB — CYCLIC CITRUL PEPTIDE ANTIBODY, IGG: Cyclic Citrullin Peptide Ab: <16 UNITS

## 2022-01-27 LAB — C-REACTIVE PROTEIN: CRP: 75.6 mg/L — ABNORMAL HIGH (ref <8.0)

## 2022-01-27 LAB — SEDIMENTATION RATE: Sed Rate: 60 mm/h — ABNORMAL HIGH (ref 0–20)

## 2022-01-28 DIAGNOSIS — J84112 Idiopathic pulmonary fibrosis: Secondary | ICD-10-CM | POA: Diagnosis not present

## 2022-01-28 LAB — ANTI-SCLERODERMA ANTIBODY: Scleroderma (Scl-70) (ENA) Antibody, IgG: <1 AI

## 2022-01-28 LAB — RHEUMATOID FACTOR: Rheumatoid fact SerPl-aCnc: <14 IU/mL (ref <14)

## 2022-01-28 LAB — ANTI-NUCLEAR AB-TITER (ANA TITER): ANA Titer 1: 1:80 {titer} — ABNORMAL HIGH

## 2022-01-28 LAB — ANA: Anti Nuclear Antibody (ANA): POSITIVE — AB

## 2022-02-03 ENCOUNTER — Telehealth: Payer: Self-pay | Admitting: Internal Medicine

## 2022-02-03 ENCOUNTER — Encounter: Payer: Self-pay | Admitting: Internal Medicine

## 2022-02-03 ENCOUNTER — Ambulatory Visit: Payer: Medicare HMO | Admitting: Internal Medicine

## 2022-02-03 VITALS — BP 126/70 | HR 90 | Ht 71.0 in | Wt 231.4 lb

## 2022-02-03 DIAGNOSIS — J849 Interstitial pulmonary disease, unspecified: Secondary | ICD-10-CM

## 2022-02-03 DIAGNOSIS — R0902 Hypoxemia: Secondary | ICD-10-CM | POA: Diagnosis not present

## 2022-02-03 DIAGNOSIS — R0602 Shortness of breath: Secondary | ICD-10-CM

## 2022-02-03 DIAGNOSIS — J984 Other disorders of lung: Secondary | ICD-10-CM

## 2022-02-03 MED ORDER — ALBUTEROL SULFATE HFA 108 (90 BASE) MCG/ACT IN AERS: 2.0000 | INHALATION_SPRAY | Freq: Four times a day (QID) | RESPIRATORY_TRACT | 3 refills | Status: DC | PRN
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
## 2022-02-03 NOTE — Patient Instructions (Addendum)
ICD-10-CM   1. ILD (interstitial lung disease) ([place])  J84.9     2. SOB (shortness of breath)  R06.02 albuterol (VENTOLIN HFA) 108 (90 Base) MCG/ACT inhaler    3. Hypoxia  R09.02 albuterol (VENTOLIN HFA) 108 (90 Base) MCG/ACT inhaler    4. Restrictive airway disease  J98.4 albuterol (VENTOLIN HFA) 108 (90 Base) MCG/ACT inhaler     You have had ILD - possible onset spring 2021 -> definitely seen in  2022 and more progressive May 2023 Pattern does NOT fit In with IPF Pattern could be a condition called HP - hypersensitivty pneumonitis Too advanced to biopsy safely Noted severe prednisone intolerance Currently needing 4L [place] at rest  Plan - too risky to do biopsy safely  - will request Right Heart cath from Dr Stanford Breed - continue o2 4L [place] at rest  - increase as needed to to keep pulse ox > 86%  -  you might need to return for a o2 titration study visit  - avoid desaturations that can be harmful  - refer [place] lung transplant team - will also email them - CMA to list prednisone allergy - severe anger outbursts =- check cbc, bmet, bnp, LFT, QUantiferon Gold 02/03/2022  (Ahead of starting ofev) - start ofev '150mg'$  twice daily - can consider immune modulator cellcept or immuran at followup if needed - ensure  no mold or feather pillow or blanket/sofa in home  - as prelude to potential transplant and overall well being - refer pulmonary rehab   - lose weight - talk to PCP Jake [name], Jake Car, PA-C about Ozempic to hasten weight loss  - address dysphagia at followup  - stop fish oil  Followup  - 4 weeks with APP  to ensure overall stability and uptake with ofev - 8-12 weeks with DR Chase Caller or first available - 30 min visit  - might need to overbook oor do special time accommodatin

## 2022-02-03 NOTE — Telephone Encounter (Signed)
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 Would you be kind enough to facilitate?  Thanks    SIGNATURE    Dr. Brand Males, M.D., F.C.C.P,  Pulmonary and Critical Care Medicine Staff Physician, [place] Director - Interstitial Lung Disease  Program  Medical Director - [place] [facility] [place] at [place], [place], [address]  NPI Number:  NPI #[id number] [facility] Number: [id number]  Pager: [phone], If no answer  -[place] or Try [phone] Telephone (clinical office): [phone] Telephone (research): [phone]  6:42 PM 02/03/2022

## 2022-02-03 NOTE — Telephone Encounter (Signed)
Raquel Sarna  Please let Cheral Marker to kow to stop his omega 3 fish oil in setting of ILD and GERD  Also with blood work please add Myositis panel  Thanks    SIGNATURE    Dr. Brand Males, M.D., F.C.C.P,  Pulmonary and Critical Care Medicine Staff Physician, [place] Director - Interstitial Lung Disease  Program  Medical Director - [place] [facility] at [place], [place], [address]  NPI Number:  NPI #[id number] [facility] Number: [id number]  Pager: [phone], If no answer  -[place] or Try [phone] Telephone (clinical office): [phone] Telephone (research): [phone]  6:37 PM 02/03/2022

## 2022-02-04 ENCOUNTER — Other Ambulatory Visit (HOSPITAL_COMMUNITY): Payer: Self-pay

## 2022-02-04 ENCOUNTER — Telehealth: Payer: Self-pay | Admitting: Pharmacy Technician

## 2022-02-04 ENCOUNTER — Ambulatory Visit (INDEPENDENT_AMBULATORY_CARE_PROVIDER_SITE_OTHER): Payer: Medicare HMO | Admitting: Physician Assistant

## 2022-02-04 DIAGNOSIS — Z0000 Encounter for general adult medical examination without abnormal findings: Secondary | ICD-10-CM

## 2022-02-04 NOTE — Telephone Encounter (Signed)
Received New start paperwork for OFEV '150mg'$ . Will update as we work through the benefits process.   Submitted a Prior Authorization request to [facility] for OFEV via CoverMyMeds. Will update once we receive a response.   [employer] - PA Case ID: [id number]

## 2022-02-04 NOTE — Telephone Encounter (Signed)
Received notification from [facility] regarding a prior authorization for [place]. Authorization has been APPROVED from 09/07/21 to 09/06/22.   Per test claim, copay for 30 days supply is $2,301.23  Authorization # (Key: [id number]) - [id number]  Spoke to patient, he will collect income documents for him and his wife- needed for patient assistance application

## 2022-02-04 NOTE — Progress Notes (Signed)
MEDICARE ANNUAL WELLNESS VISIT  02/04/2022  Telephone Visit Disclaimer This Medicare AWV was conducted by telephone due to national recommendations for restrictions regarding the COVID-19 Pandemic (e.g. social distancing).  I verified, using two identifiers, that I am speaking with Jake [name] or their authorized healthcare agent. I discussed the limitations, risks, security, and privacy concerns of performing an evaluation and management service by telephone and the potential availability of an in-person appointment in the future. The patient expressed understanding and agreed to proceed.  Location of Patient: Home Location of Provider (nurse):  Provider home  Subjective:    Jake [name] is a 61 y.o. male patient of Breeback, Royetta Car, PA-C who had a [employer] Visit today via telephone. [name] is Legally disabled and lives with their family. he has 1 child. he reports that he is socially active and does interact with friends/family regularly. he is minimally physically active and enjoys spending time with his grandchildren.  Patient Care Team: Lavada Mesi as PCP - General (Family Medicine) Berniece Salines, DO as PCP - Cardiology (Cardiology) Karie Chimera, PA-C as Referring Physician (Pulmonary Disease) Darius Bump, [facility] as Pharmacist (Pharmacist)     02/04/2022    9:36 AM 11/18/2021    8:28 AM 09/03/2021    6:29 PM 10/18/2020    2:04 PM 08/22/2018    2:17 PM 03/16/2018    3:28 PM 01/18/2018   11:59 AM  Advanced Directives  Does Patient Have a Medical Advance Directive? No No No No No No No  Would patient like information on creating a medical advance directive? No - Patient declined No - Patient declined  No - Patient declined No - Patient declined No - Patient declined No - Patient declined    Hospital Utilization Over the Past 12 Months: # of hospitalizations or ER visits: 2 # of surgeries: 0  Review of Systems    Patient reports that his  overall health is worse compared to last year.  History obtained from chart review and the patient  Patient Reported Readings (BP, Pulse, CBG, Weight, etc) none  Pain Assessment Pain : No/denies pain     Current Medications & Allergies (verified) Allergies as of 02/04/2022       Reactions   Fentanyl Other (See Comments)   Avoid opiates Other reaction(s): Other Avoid opiates   Other    04/06/18 Patient is a recovering narcotic abuser and, per wife, is NEVER to receive controlled substances (e.g. Narcotics, sedatives, etc.) Other reaction(s): Unknown 04/06/18 Patient is a recovering narcotic abuser and, per wife, is NEVER to receive controlled substances (e.g. Narcotics, sedatives, etc.)   Aspirin Nausea And Vomiting   Atorvastatin Nausea And Vomiting   GI side effects.    Prednisone    Can not tolerate oral but can tolerate shots.  Other reaction(s): Agitation Can not tolerate oral but can tolerate shots.    Contrast Media [iodinated Contrast Media] Palpitations   Nitroglycerin Nausea And Vomiting        Medication List        Accurate as of Feb 04, 2022  9:48 AM. If you have any questions, ask your nurse or doctor.          albuterol 108 (90 Base) MCG/ACT inhaler Commonly known as: VENTOLIN HFA Inhale 2 puffs into the lungs every 6 (six) hours as needed.   AMBULATORY NON FORMULARY MEDICATION Continuous positive airway pressure (CPAP) machine set at autotitration of H2O pressure, with all supplemental supplies  as needed.   chlorthalidone 25 MG tablet Commonly known as: HYGROTON TAKE 1 TABLET BY MOUTH EVERY DAY   colestipol 1 g tablet Commonly known as: COLESTID Take 1 tablet (1 g total) by mouth daily.   dicyclomine 20 MG tablet Commonly known as: BENTYL Take 1 tablet (20 mg total) by mouth 3 (three) times daily.   Entresto 49-51 MG Generic drug: sacubitril-valsartan Take 1 tablet by mouth 2 (two) times daily.   finasteride 5 MG tablet Commonly known  as: PROSCAR TAKE 1 TABLET (5 MG TOTAL) BY MOUTH DAILY.   FISH OIL PO Take 1,400 mg by mouth daily.   fluticasone 50 MCG/ACT nasal spray Commonly known as: FLONASE Place into both nostrils daily.   ibuprofen 800 MG tablet Commonly known as: ADVIL TAKE 1 TABLET BY MOUTH EVERY 8 HOURS AS NEEDED   loratadine 10 MG tablet Commonly known as: CLARITIN Take 10 mg by mouth daily.   metoprolol succinate 50 MG 24 hr tablet Commonly known as: TOPROL-XL Take 50 mg by mouth daily.   modafinil 200 MG tablet Commonly known as: PROVIGIL Take 1 tablet (200 mg total) by mouth daily.   ondansetron 8 MG tablet Commonly known as: ZOFRAN TAKE 1 TABLET BY MOUTH EVERY 8 HOURS AS NEEDED FOR NAUSEA FOR VOMITING   OVER THE COUNTER MEDICATION Place 1 spray into both nostrils daily. Triamcinolone acetonide Nasal spray   pantoprazole 40 MG tablet Commonly known as: PROTONIX Take 1 tablet (40 mg total) by mouth 2 (two) times daily.   sertraline 100 MG tablet Commonly known as: ZOLOFT TAKE 1 & 1/2 (ONE & ONE-HALF) TABLETS BY MOUTH ONCE DAILY   Zypitamag 2 MG Tabs Generic drug: Pitavastatin Magnesium TAKE 1 TABLET BY MOUTH DAILY.        History (reviewed): Past Medical History:  Diagnosis Date   Arthritis    lower back   Bipolar disorder ([place])    GERD (gastroesophageal reflux disease)    Hyperlipidemia    Hypertension    Substance abuse in remission ([place]) 04/06/2018   AVOID OPIATES!   Past Surgical History:  Procedure Laterality Date   ANKLE FRACTURE SURGERY Left    APPENDECTOMY     BIOPSY  11/18/2021   Procedure: BIOPSY;  Surgeon: Lavena Bullion, DO;  Location: [facility];  Service: Gastroenterology;;  EGD and COLON   CERVICAL FUSION     CHOLECYSTECTOMY     COLON SURGERY     COLONOSCOPY WITH PROPOFOL N/A 11/18/2021   Procedure: COLONOSCOPY WITH PROPOFOL;  Surgeon: Lavena Bullion, DO;  Location: [facility];  Service: Gastroenterology;  Laterality: N/A;    ESOPHAGOGASTRODUODENOSCOPY (EGD) WITH PROPOFOL N/A 11/18/2021   Procedure: ESOPHAGOGASTRODUODENOSCOPY (EGD) WITH PROPOFOL;  Surgeon: Lavena Bullion, DO;  Location: [facility];  Service: Gastroenterology;  Laterality: N/A;   HERNIA REPAIR     POLYPECTOMY  11/18/2021   Procedure: POLYPECTOMY;  Surgeon: Lavena Bullion, DO;  Location: [facility];  Service: Gastroenterology;;   Family History  Problem Relation Age of Onset   Cancer Mother        breast   Depression Mother    Hyperlipidemia Mother    Dementia Mother    Alcohol abuse Father    Cancer Father        skin   Hyperlipidemia Father    Alzheimer's disease Father    Cancer Maternal Grandfather    Cancer Paternal Grandmother    Pancreatic cancer Paternal Uncle    Colon cancer Neg Hx  Esophageal cancer Neg Hx    Stomach cancer Neg Hx    Social History   Socioeconomic History   Marital status: Married    Spouse name: Dawn   Number of children: 1   Years of education: 14   Highest education level: Some college, no degree  Occupational History   Occupation: Disabled  Tobacco Use   Smoking status: Former    Packs/day: 1.00    Years: 25.00    Pack years: 25.00    Types: Cigarettes    Start date: 1982    Quit date: 2010    Years since quitting: 13.4   Smokeless tobacco: Never  Vaping Use   Vaping Use: Never used  Substance and Sexual Activity   Alcohol use: No   Drug use: No    Comment: 04/06/2018 Patient's wife wanted [place] to be very aware that her husband is a recovering narcotic abuser   Sexual activity: Yes  Other Topics Concern   Not on file  Social History Narrative   Lives with his wife, daughter, son-in-law and 2 two grandchildren. Likes to spend to time with grandchildren. Does not exercise due to back issues in the past and respiratory problems.   Social Determinants of Health   Financial Resource Strain: Low Risk    Difficulty of Paying Living Expenses: Not hard at all  Food Insecurity: No  Food Insecurity   Worried About Charity fundraiser in the Last Year: Never true   [place] in the Last Year: Never true  Transportation Needs: No Transportation Needs   Lack of Transportation (Medical): No   Lack of Transportation (Non-Medical): No  Physical Activity: Inactive   Days of Exercise per Week: 0 days   Minutes of Exercise per Session: 0 min  Stress: No Stress Concern Present   Feeling of Stress : Not at all  Social Connections: Moderately Integrated   Frequency of Communication with Friends and Family: Once a week   Frequency of Social Gatherings with Friends and Family: More than three times a week   Attends Religious Services: More than 4 times per year   Active Member of [employer] or Organizations: No   Attends Archivist Meetings: Never   Marital Status: Married    Activities of Daily Living    02/04/2022    9:38 AM 01/31/2022    8:58 AM  In your present state of health, do you have any difficulty performing the following activities:  Hearing?  0  Vision?  0  Difficulty concentrating or making decisions?  0  Walking or climbing stairs?  0  Dressing or bathing?  0  Doing errands, shopping? 1 0  Comment he is not currently driving and has to have someone with him   Preparing Food and eating ?  N  Using the Toilet?  N  In the past six months, have you accidently leaked urine?  N  Do you have problems with loss of bowel control?  N  Managing your Medications?  N  Managing your Finances?  N  Housekeeping or managing your Housekeeping?  N    Patient Education/ Literacy How often do you need to have someone help you when you read instructions, pamphlets, or other written materials from your doctor or pharmacy?: 1 - Never What is the last grade level you completed in school?: 2 years of college  Exercise Current Exercise Habits: The patient does not participate in regular exercise at present, Exercise limited by:  respiratory  conditions(s)  Diet Patient reports consuming 3 meals a day and 0 snack(s) a day Patient reports that his primary diet is: Regular Patient reports that she does have regular access to food.   Depression Screen    02/04/2022    9:36 AM 06/10/2021    9:43 AM 10/18/2020    2:05 PM 08/22/2018    2:18 PM 02/16/2018   12:37 PM 06/09/2017    5:10 PM  PHQ 2/9 Scores  PHQ - 2 Score 0 0 0 1 0 2  PHQ- 9 Score  4 0   6     Fall Risk    02/04/2022    9:36 AM 06/10/2021    9:43 AM 10/18/2020    2:04 PM 08/22/2018    2:18 PM 02/16/2018   12:37 PM  Fall Risk   Falls in the past year? 0 0 0 0 No  Number falls in past yr: 0 0 0    Injury with Fall? 0 0 0    Risk for fall due to : No Fall Risks No Fall Risks No Fall Risks    Follow up Falls evaluation completed Falls evaluation completed Falls evaluation completed;Education provided Falls prevention discussed      Objective:  Ronelle Smallman seemed alert and oriented and he participated appropriately during our telephone visit.  Blood Pressure Weight BMI  BP Readings from Last 3 Encounters:  02/03/22 126/70  01/26/22 136/78  01/19/22 110/70   Wt Readings from Last 3 Encounters:  02/03/22 231 lb 6.4 oz (105 kg)  01/26/22 234 lb 1.3 oz (106.2 kg)  01/19/22 234 lb (106.1 kg)   BMI Readings from Last 1 Encounters:  02/03/22 32.27 kg/m    *Unable to obtain current vital signs, weight, and BMI due to telephone visit type  Hearing/Vision  Yavuz did not seem to have difficulty with hearing/understanding during the telephone conversation Reports that he has had a formal eye exam by an eye care professional within the past year Reports that he has had a formal hearing evaluation within the past year *Unable to fully assess hearing and vision during telephone visit type  Cognitive Function:    02/04/2022    9:40 AM 10/18/2020    2:08 PM 08/22/2018    2:22 PM  6CIT Screen  What Year? 0 points 0 points 0 points  What month? 0 points 0  points 0 points  What time? 0 points 0 points 0 points  Count back from 20 0 points 0 points 0 points  Months in reverse 0 points 0 points 0 points  Repeat phrase 0 points 0 points 2 points  Total Score 0 points 0 points 2 points   (Normal:0-7, Significant for Dysfunction: >8)  Normal Cognitive Function Screening: Yes   Immunization & Health Maintenance Record Immunization History  Administered Date(s) Administered   Influenza,inj,Quad PF,6+ Mos 07/16/2014, 07/21/2017, 08/22/2018, 06/13/2019, 06/10/2021   Influenza-Unspecified 07/16/2014, 06/14/2016   PFIZER(Purple Top)SARS-COV-2 Vaccination 11/21/2019, 12/24/2019, 06/23/2020   Td 04/15/2015   Tdap 10/10/2009   Zoster Recombinat (Shingrix) 06/10/2021    Health Maintenance  Topic Date Due   COVID-19 Vaccine (4 - Booster for [place] series) 02/20/2022 (Originally 08/18/2020)   Zoster Vaccines- Shingrix (2 of 2) 05/07/2022 (Originally 08/05/2021)   HIV Screening  06/10/2022 (Originally 01/06/1976)   INFLUENZA VACCINE  04/07/2022   TETANUS/TDAP  04/14/2025   COLONOSCOPY (Pts 45-98yr Insurance coverage will need to be confirmed)  11/19/2031   Hepatitis C Screening  Completed  HPV VACCINES  Aged Out       Assessment  This is a routine wellness examination for Mousa Prout.  Health Maintenance: Due or Overdue There are no preventive care reminders to display for this patient.   Jake [name] does not need a referral for Community Assistance: Care Management:   no Social Work:    no Prescription Assistance:  no Nutrition/Diabetes Education:  no   Plan:  Personalized Goals  Goals Addressed               This Visit's Progress     Patient Stated (pt-stated)        Would like for his SPO2 to be above 90% without oxygen.       Personalized Health Maintenance & Screening Recommendations  Shingrix -2nd dose  Lung Cancer Screening Recommended: yes; however patient declined this scan at this time as he had a  chest CT scan done a few weeks ago. (Low Dose CT Chest recommended if Age 93-80 years, 30 pack-year currently smoking OR have quit w/in past 15 years) Hepatitis C Screening recommended: no HIV Screening recommended: yes  Advanced Directives: Written information was not prepared per patient's request.  Referrals & Orders No orders of the defined types were placed in this encounter.   Follow-up Plan Follow-up with Donella Stade, PA-C as planned Schedule your 2nd dose of Shingrix vaccine.  Medicare wellness visit in one year. AVS printed and mailed to the patient.   I have personally reviewed and noted the following in the patient's chart:   Medical and social history Use of alcohol, tobacco or illicit drugs  Current medications and supplements Functional ability and status Nutritional status Physical activity Advanced directives List of other physicians Hospitalizations, surgeries, and ER visits in previous 12 months Vitals Screenings to include cognitive, depression, and falls Referrals and appointments  In addition, I have reviewed and discussed with Jake [name] certain preventive protocols, quality metrics, and best practice recommendations. A written personalized care plan for preventive services as well as general preventive health recommendations is available and can be mailed to the patient at his request.      Tinnie Gens, RN  02/04/2022

## 2022-02-04 NOTE — Patient Instructions (Addendum)
[place] Maintenance Summary and Written Plan of Care  Mr. Jake [name] ,  Thank you for allowing me to perform your Medicare Annual Wellness Visit and for your ongoing commitment to your health.   Health Maintenance & Immunization History Health Maintenance  Topic Date Due   COVID-19 Vaccine (4 - Booster for Pfizer series) 02/20/2022 (Originally 08/18/2020)   Zoster Vaccines- Shingrix (2 of 2) 05/07/2022 (Originally 08/05/2021)   HIV Screening  06/10/2022 (Originally 01/06/1976)   INFLUENZA VACCINE  04/07/2022   TETANUS/TDAP  04/14/2025   COLONOSCOPY (Pts 45-28yr Insurance coverage will need to be confirmed)  11/19/2031   Hepatitis C Screening  Completed   HPV VACCINES  Aged Out   Immunization History  Administered Date(s) Administered   Influenza,inj,Quad PF,6+ Mos 07/16/2014, 07/21/2017, 08/22/2018, 06/13/2019, 06/10/2021   Influenza-Unspecified 07/16/2014, 06/14/2016   PFIZER(Purple Top)SARS-COV-2 Vaccination 11/21/2019, 12/24/2019, 06/23/2020   Td 04/15/2015   Tdap 10/10/2009   Zoster Recombinat (Shingrix) 06/10/2021    These are the patient goals that we discussed:  Goals Addressed               This Visit's Progress     Patient Stated (pt-stated)        Would like for his SPO2 to be above 90% without oxygen.         This is a list of Health Maintenance Items that are overdue or due now: Shingrix-2nd dose  Orders/Referrals Placed Today: No orders of the defined types were placed in this encounter.  (Contact our referral department at [phone] you have not spoken with someone about your referral appointment within the next 5 days)    Follow-up Plan Follow-up with BDonella Stade PA-C as planned Schedule your 2nd dose of Shingrix vaccine.  Medicare wellness visit in one year. AVS printed and mailed to the patient.      Health Maintenance, Male Adopting a healthy lifestyle and getting preventive care are important in  promoting health and wellness. Ask your health care provider about: The right schedule for you to have regular tests and exams. Things you can do on your own to prevent diseases and keep yourself healthy. What should I know about diet, weight, and exercise? Eat a healthy diet  Eat a diet that includes plenty of vegetables, fruits, low-fat dairy products, and lean protein. Do not eat a lot of foods that are high in solid fats, added sugars, or sodium. Maintain a healthy weight Body mass index (BMI) is a measurement that can be used to identify possible weight problems. It estimates body fat based on height and weight. Your health care provider can help determine your BMI and help you achieve or maintain a healthy weight. Get regular exercise Get regular exercise. This is one of the most important things you can do for your health. Most adults should: Exercise for at least 150 minutes each week. The exercise should increase your heart rate and make you sweat (moderate-intensity exercise). Do strengthening exercises at least twice a week. This is in addition to the moderate-intensity exercise. Spend less time sitting. Even light physical activity can be beneficial. Watch cholesterol and blood lipids Have your blood tested for lipids and cholesterol at 61years of age, then have this test every 5 years. You may need to have your cholesterol levels checked more often if: Your lipid or cholesterol levels are high. You are older than 61years of age. You are at high risk for heart disease. What should I  know about cancer screening? Many types of cancers can be detected early and may often be prevented. Depending on your health history and family history, you may need to have cancer screening at various ages. This may include screening for: Colorectal cancer. Prostate cancer. Skin cancer. Lung cancer. What should I know about heart disease, diabetes, and high blood pressure? Blood pressure and  heart disease High blood pressure causes heart disease and increases the risk of stroke. This is more likely to develop in people who have high blood pressure readings or are overweight. Talk with your health care provider about your target blood pressure readings. Have your blood pressure checked: Every 3-5 years if you are 71-58 years of age. Every year if you are 107 years old or older. If you are between the ages of 55 and 1 and are a current or former smoker, ask your health care provider if you should have a one-time screening for abdominal aortic aneurysm (AAA). Diabetes Have regular diabetes screenings. This checks your fasting blood sugar level. Have the screening done: Once every three years after age 81 if you are at a normal weight and have a low risk for diabetes. More often and at a younger age if you are overweight or have a high risk for diabetes. What should I know about preventing infection? Hepatitis B If you have a higher risk for hepatitis B, you should be screened for this virus. Talk with your health care provider to find out if you are at risk for hepatitis B infection. Hepatitis C Blood testing is recommended for: Everyone born from [birth year] through 1965. Anyone with known risk factors for hepatitis C. Sexually transmitted infections (STIs) You should be screened each year for STIs, including gonorrhea and chlamydia, if: You are sexually active and are younger than 61 years of age. You are older than 61 years of age and your health care provider tells you that you are at risk for this type of infection. Your sexual activity has changed since you were last screened, and you are at increased risk for chlamydia or gonorrhea. Ask your health care provider if you are at risk. Ask your health care provider about whether you are at high risk for HIV. Your health care provider may recommend a prescription medicine to help prevent HIV infection. If you choose to take medicine to  prevent HIV, you should first get tested for HIV. You should then be tested every 3 months for as long as you are taking the medicine. Follow these instructions at home: Alcohol use Do not drink alcohol if your health care provider tells you not to drink. If you drink alcohol: Limit how much you have to 0-2 drinks a day. Know how much alcohol is in your drink. In the [place] one drink equals one 12 oz bottle of beer (355 mL), one 5 oz glass of wine (148 mL), or one 1 oz glass of hard liquor (44 mL). Lifestyle Do not use any products that contain nicotine or tobacco. These products include cigarettes, chewing tobacco, and vaping devices, such as e-cigarettes. If you need help quitting, ask your health care provider. Do not use street drugs. Do not share needles. Ask your health care provider for help if you need support or information about quitting drugs. General instructions Schedule regular health, dental, and eye exams. Stay current with your vaccines. Tell your health care provider if: You often feel depressed. You have ever been abused or do not feel safe at home.  Summary Adopting a healthy lifestyle and getting preventive care are important in promoting health and wellness. Follow your health care provider's instructions about healthy diet, exercising, and getting tested or screened for diseases. Follow your health care provider's instructions on monitoring your cholesterol and blood pressure. This information is not intended to replace advice given to you by your health care provider. Make sure you discuss any questions you have with your health care provider. Document Revised: 01/13/2021 Document Reviewed: 01/13/2021 Elsevier Patient Education  [place].

## 2022-02-04 NOTE — Telephone Encounter (Signed)
Called and spoke with pt letting him know info per MR and he verbalized understanding. Fish oil has been d/c'd and have ordered the other blood test. Pt stated he would come by office either tomorrow 6/1 or Friday 6/2 to have bloodwork done. Nothing further needed.
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
## 2022-02-04 NOTE — Telephone Encounter (Signed)
Spoke with pt, Follow up scheduled  

## 2022-02-04 NOTE — Telephone Encounter (Signed)
Spoke with patient - he states he will fax income documents to clinic. Provided him fax number for pharmacy office  [name], PharmD, MPH, BCPS, CPP Clinical Pharmacist (Rheumatology and Pulmonology)

## 2022-02-04 NOTE — Progress Notes (Signed)
Virtual Visit via Video Note   Because of Devynn Scheff co-morbid illnesses, he is at least at moderate risk for complications without adequate follow up.  This format is felt to be most appropriate for this patient at this time.  All issues noted in this document were discussed and addressed.  A limited physical exam was performed with this format.  Please refer to the patient's chart for his consent to telehealth for [facility].      Date:  02/11/2022   ID:  Cheral Marker, DOB [birth year], MRN [id number]  Patient Location:Home Provider Location: Home  PCP:  Donella Stade, PA-C  Cardiologist:  Dr Stanford Breed  Evaluation Performed:  Follow-Up Visit  Chief Complaint:  FU CM  History of Present Illness:    HPI: Follow-up nonischemic cardiomyopathy.  Previously followed by Dr. Harriet Masson and at [facility]. Nuclear study March 2021 showed ejection fraction 54% and normal perfusion.  CPX March 2022 showed submaximal effort, moderate functional impairment due to pulmonary restriction.  High resolution chest CT March 2022 showed interstitial lung disease and aortic atherosclerosis; enlarged pulmonary trunk suggestive of pulmonary hypertension.  Cardiac catheterization March 2022 showed normal left main, minimal irregularities in the LAD, 2 small diagonals, circumflex, 2 obtuse marginals, posterior lateral, PDA and nondominant right coronary artery; ejection fraction 30 to 35% with mild left ventricular enlargement.  Chest CT December 2022 showed cardiomegaly, coronary calcification, aortic root measuring 4.2 cm, 4.1 cm ascending aorta.  Echocardiogram December 2022 showed ejection fraction 40 to 45%, mild left atrial enlargement, mild mitral regurgitation.  Seen in the office Jan 26, 2022 with syncopal episode while driving.  Seen in follow-up May 30 by Dr. Chase Caller and right heart catheterization recommended.  Since last seen patient continues to have fatigue and dyspnea on exertion.  He denies  orthopnea, PND, pedal edema, chest pain or recurrent syncope  The patient does not have symptoms concerning for COVID-19 infection (fever, chills, cough, or new shortness of breath).    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   Past Surgical History:  Procedure Laterality Date   ANKLE FRACTURE SURGERY Left    APPENDECTOMY     BIOPSY  11/18/2021   Procedure: BIOPSY;  Surgeon: Lavena Bullion, DO;  Location: [facility];  Service: Gastroenterology;;  EGD and COLON   CERVICAL FUSION     CHOLECYSTECTOMY     COLON SURGERY     COLONOSCOPY WITH PROPOFOL N/A 11/18/2021   Procedure: COLONOSCOPY WITH PROPOFOL;  Surgeon: Lavena Bullion, DO;  Location: [facility];  Service: Gastroenterology;  Laterality: N/A;   ESOPHAGOGASTRODUODENOSCOPY (EGD) WITH PROPOFOL N/A 11/18/2021   Procedure: ESOPHAGOGASTRODUODENOSCOPY (EGD) WITH PROPOFOL;  Surgeon: Lavena Bullion, DO;  Location: [facility];  Service: Gastroenterology;  Laterality: N/A;   HERNIA REPAIR     POLYPECTOMY  11/18/2021   Procedure: POLYPECTOMY;  Surgeon: Lavena Bullion, DO;  Location: [facility];  Service: Gastroenterology;;     Current Meds  Medication Sig   albuterol (VENTOLIN HFA) 108 (90 Base) MCG/ACT inhaler Inhale 2 puffs into the lungs every 6 (six) hours as needed.   AMBULATORY NON FORMULARY MEDICATION Continuous positive airway pressure (CPAP) machine set at autotitration of H2O pressure, with all supplemental supplies as needed.  chlorthalidone (HYGROTON) 25 MG tablet TAKE 1 TABLET BY MOUTH EVERY DAY   colestipol (COLESTID) 1 g tablet Take 1 tablet (1 g total) by mouth daily.   dicyclomine (BENTYL) 20 MG tablet Take 1 tablet (20 mg total) by mouth 3 (three) times daily.   ENTRESTO 49-51 MG Take 1 tablet by mouth 2  (two) times daily.   finasteride (PROSCAR) 5 MG tablet TAKE 1 TABLET (5 MG TOTAL) BY MOUTH DAILY.   fluticasone (FLONASE) 50 MCG/ACT nasal spray Place into both nostrils daily.   ibuprofen (ADVIL) 800 MG tablet TAKE 1 TABLET BY MOUTH EVERY 8 HOURS AS NEEDED   loratadine (CLARITIN) 10 MG tablet Take 10 mg by mouth daily.   metoprolol succinate (TOPROL-XL) 50 MG 24 hr tablet Take 50 mg by mouth daily.   modafinil (PROVIGIL) 200 MG tablet Take 1 tablet (200 mg total) by mouth daily.   ondansetron (ZOFRAN) 8 MG tablet TAKE 1 TABLET BY MOUTH EVERY 8 HOURS AS NEEDED FOR NAUSEA FOR VOMITING   OVER THE COUNTER MEDICATION Place 1 spray into both nostrils daily. Triamcinolone acetonide Nasal spray   pantoprazole (PROTONIX) 40 MG tablet TAKE 1 TABLET BY MOUTH TWICE A DAY   sertraline (ZOLOFT) 100 MG tablet TAKE 1 & 1/2 (ONE & ONE-HALF) TABLETS BY MOUTH ONCE DAILY   sodium bicarbonate 650 MG tablet TAKE ONE TABLET BY MOUTH 4 TIMES DAILY .   ZYPITAMAG 2 MG TABS TAKE 1 TABLET BY MOUTH DAILY.     Allergies:   Fentanyl, Other, Aspirin, Atorvastatin, Prednisone, Contrast media [iodinated contrast media], and Nitroglycerin   Social History   Tobacco Use   Smoking status: Former    Packs/day: 1.00    Years: 25.00    Pack years: 25.00    Types: Cigarettes    Start date: 42    Quit date: 2010    Years since quitting: 13.4   Smokeless tobacco: Never  Vaping Use   Vaping Use: Never used  Substance Use Topics   Alcohol use: No   Drug use: No    Comment: 04/06/2018 Patient's wife wanted [place] to be very aware that her husband is a recovering narcotic abuser     Family Hx: The patient's family history includes Alcohol abuse in his father; Alzheimer's disease in his father; Cancer in his father, maternal grandfather, mother, and paternal grandmother; Dementia in his mother; Depression in his mother; Hyperlipidemia in his father and mother; Pancreatic cancer in his paternal uncle. There is no history of  Colon cancer, Esophageal cancer, or Stomach cancer.  ROS:   Please see the history of present illness.    No Fever, chills  or productive cough All other systems reviewed and are negative.  Recent Labs: 06/09/2021: TSH 1.13 09/12/2021: Magnesium 2.2 01/26/2022: Brain Natriuretic Peptide 31 02/09/2022: ALT 30; BUN 31; Creatinine, Ser 1.10; Hemoglobin 12.7; Platelets 398.0; Potassium 3.6; Pro B Natriuretic peptide (BNP) 28.0; Sodium 134   Recent Lipid Panel Lab Results  Component Value Date/Time   CHOL 160 06/09/2021 10:01 AM   TRIG 184 (H) 06/09/2021 10:01 AM   HDL 40 06/09/2021 10:01 AM   CHOLHDL 4.0 06/09/2021 10:01 AM   LDLCALC 92 06/09/2021 10:01 AM    Wt Readings from Last 3 Encounters:  02/11/22 228 lb 9.6 oz (103.7 kg)  02/09/22 229 lb (103.9 kg)  02/03/22 231 lb 6.4 oz (105 kg)     Objective:    Vital Signs:  BP 113/66   Pulse 85   Ht  $'5\' 11"'S$  (1.803 m)   Wt 228 lb 9.6 oz (103.7 kg)   SpO2 94%   BMI 31.88 kg/m    VITAL SIGNS:  reviewed NAD Answers questions appropriately Normal affect Remainder of physical examination not performed (telehealth visit; coronavirus pandemic)  ASSESSMENT & PLAN:    1 interstitial lung disease-patient has been seen by Dr. Chase Caller and right heart catheterization recommended.  We will arrange.  The risk and benefits were discussed with the patient and he agrees to proceed.  He is also scheduled to be evaluated at [facility] for possible lung transplant.  2 history of nonischemic cardiomyopathy-continue Entresto and Toprol.  Follow-up echocardiogram has been scheduled for tomorrow.  3 chronic combined systolic/diastolic congestive heart failure-we will plan to continue dose of chlorthalidone as he appears to be euvolemic.  He declined [place].  4 hypertension-patient's blood pressure is controlled today.  Continue present medical regimen.  5 hyperlipidemia-intolerant to statins.  Will consider adding other meds once pulmonary evaluation  complete.  6 recent syncope-no further events.  Patient should not drive for 6 months following his last episode.  7 obstructive sleep apnea-continue CPAP.  8 coronary calcification-intolerant to statins.  9 thoracic aortic aneurysm-plan follow-up CTA December 2023.  Kirk Ruths, MD  COVID-19 Education: The importance of social distancing was discussed today.  Time:   Today, I have spent 16 minutes with the patient with telehealth technology discussing the above problems.     Medication Adjustments/Labs and Tests Ordered: Current medicines are reviewed at length with the patient today.  Concerns regarding medicines are outlined above.   Tests Ordered: No orders of the defined types were placed in this encounter.   Medication Changes: No orders of the defined types were placed in this encounter.   Follow Up:  In Person in 3 month(s)  Signed, Kirk Ruths, MD  02/11/2022 2:33 PM    [place].

## 2022-02-06 ENCOUNTER — Telehealth: Payer: Self-pay | Admitting: Internal Medicine

## 2022-02-06 DIAGNOSIS — I5022 Chronic systolic (congestive) heart failure: Secondary | ICD-10-CM

## 2022-02-06 NOTE — Telephone Encounter (Signed)
Hi Jake [name]  I referred Jake [name] to [facility] lung transplant services. They reviewed chart. They want a repeat echo (last June 2022) before making a decision on seeing him. Would you be kind enough to facilitate this as well in addition to [place]?  Thanks  MR

## 2022-02-09 ENCOUNTER — Ambulatory Visit (INDEPENDENT_AMBULATORY_CARE_PROVIDER_SITE_OTHER): Payer: Medicare HMO | Admitting: Physician Assistant

## 2022-02-09 ENCOUNTER — Encounter: Payer: Self-pay | Admitting: Physician Assistant

## 2022-02-09 ENCOUNTER — Other Ambulatory Visit (INDEPENDENT_AMBULATORY_CARE_PROVIDER_SITE_OTHER): Payer: Medicare HMO

## 2022-02-09 ENCOUNTER — Other Ambulatory Visit: Payer: Self-pay | Admitting: Physician Assistant

## 2022-02-09 VITALS — BP 148/82 | HR 91 | Ht 71.0 in | Wt 229.0 lb

## 2022-02-09 DIAGNOSIS — I5022 Chronic systolic (congestive) heart failure: Secondary | ICD-10-CM

## 2022-02-09 DIAGNOSIS — Z6831 Body mass index (BMI) 31.0-31.9, adult: Secondary | ICD-10-CM | POA: Diagnosis not present

## 2022-02-09 DIAGNOSIS — J849 Interstitial pulmonary disease, unspecified: Secondary | ICD-10-CM | POA: Diagnosis not present

## 2022-02-09 DIAGNOSIS — R0602 Shortness of breath: Secondary | ICD-10-CM | POA: Diagnosis not present

## 2022-02-09 DIAGNOSIS — E6609 Other obesity due to excess calories: Secondary | ICD-10-CM

## 2022-02-09 DIAGNOSIS — J984 Other disorders of lung: Secondary | ICD-10-CM | POA: Diagnosis not present

## 2022-02-09 DIAGNOSIS — R0902 Hypoxemia: Secondary | ICD-10-CM | POA: Diagnosis not present

## 2022-02-09 DIAGNOSIS — J9611 Chronic respiratory failure with hypoxia: Secondary | ICD-10-CM

## 2022-02-09 LAB — CBC WITH DIFFERENTIAL/PLATELET
Basophils Absolute: 0.1 10*3/uL (ref 0.0–0.1)
Basophils Relative: 0.9 % (ref 0.0–3.0)
Eosinophils Absolute: 0.3 10*3/uL (ref 0.0–0.7)
Eosinophils Relative: 4.3 % (ref 0.0–5.0)
HCT: 37.9 % — ABNORMAL LOW (ref 39.0–52.0)
Hemoglobin: 12.7 g/dL — ABNORMAL LOW (ref 13.0–17.0)
Lymphocytes Relative: 14.5 % (ref 12.0–46.0)
Lymphs Abs: 1.1 10*3/uL (ref 0.7–4.0)
MCHC: 33.6 g/dL (ref 30.0–36.0)
MCV: 79.8 fl (ref 78.0–100.0)
Monocytes Absolute: 0.8 10*3/uL (ref 0.1–1.0)
Monocytes Relative: 10.3 % (ref 3.0–12.0)
Neutro Abs: 5.1 10*3/uL (ref 1.4–7.7)
Neutrophils Relative %: 70 % (ref 43.0–77.0)
Platelets: 398 10*3/uL (ref 150.0–400.0)
RBC: 4.75 Mil/uL (ref 4.22–5.81)
RDW: 14.4 % (ref 11.5–15.5)
WBC: 7.3 10*3/uL (ref 4.0–10.5)

## 2022-02-09 LAB — HEPATIC FUNCTION PANEL
ALT: 30 U/L (ref 0–53)
AST: 24 U/L (ref 0–37)
Albumin: 4.4 g/dL (ref 3.5–5.2)
Alkaline Phosphatase: 64 U/L (ref 39–117)
Bilirubin, Direct: 0.1 mg/dL (ref 0.0–0.3)
Total Bilirubin: 0.5 mg/dL (ref 0.2–1.2)
Total Protein: 7.7 g/dL (ref 6.0–8.3)

## 2022-02-09 LAB — BASIC METABOLIC PANEL
BUN: 31 mg/dL — ABNORMAL HIGH (ref 6–23)
CO2: 34 mEq/L — ABNORMAL HIGH (ref 19–32)
Calcium: 9.9 mg/dL (ref 8.4–10.5)
Chloride: 92 mEq/L — ABNORMAL LOW (ref 96–112)
Creatinine, Ser: 1.1 mg/dL (ref 0.40–1.50)
GFR: 72.66 mL/min (ref >60.00)
Glucose, Bld: 93 mg/dL (ref 70–99)
Potassium: 3.6 mEq/L (ref 3.5–5.1)
Sodium: 134 mEq/L — ABNORMAL LOW (ref 135–145)

## 2022-02-09 MED ORDER — SODIUM BICARBONATE 650 MG PO TABS: ORAL_TABLET | ORAL | 5 refills | Status: DC

## 2022-02-09 MED ORDER — WEGOVY 0.25 MG/0.5ML ~~LOC~~ SOAJ: 0.2500 mg | SUBCUTANEOUS | 0 refills | Status: DC

## 2022-02-09 MED ORDER — WEGOVY 1 MG/0.5ML ~~LOC~~ SOAJ: 1.0000 mg | SUBCUTANEOUS | 0 refills | Status: DC

## 2022-02-09 MED ORDER — WEGOVY 0.5 MG/0.5ML ~~LOC~~ SOAJ: 0.5000 mg | SUBCUTANEOUS | 0 refills | Status: DC

## 2022-02-09 MED ORDER — ALBUTEROL SULFATE HFA 108 (90 BASE) MCG/ACT IN AERS: 2.0000 | INHALATION_SPRAY | Freq: Four times a day (QID) | RESPIRATORY_TRACT | 3 refills | Status: DC | PRN

## 2022-02-09 NOTE — Progress Notes (Addendum)
Established Patient Office Visit  Subjective   Patient ID: Jake [name], male    DOB: 1961-06-30  Age: 61 y.o. MRN: [id number]  Chief Complaint  Patient presents with   Follow-up    Pt needs rf on sodium bicarbonate     HPI Pt is a 61 yo obese male with ILD, chronic respiratory failure, OSA, CHF who presents to the clinic for follow up.   He needs O2 supplies with [employer] to be in same company as CPAP supplies. He continues to be managed by pulmonology and cardiology. He has a heart cath scheduled to look for any blockages. Pulm is wanting to start TKI medication for ILD. He SOB and fatigue is worsening. He is more and winded with little to no exercise. His pulse ox can drop to 82 percent with little to no exertion. He is not able to drive. He is going to be considered for lung transplant. He needs to lose weight but cannot exercise.    .. 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[place]) 05/27/2017   History of pancreatitis 05/27/2017   Diverticulitis 05/27/2017   Elevated fasting glucose 06/10/2017   Low testosterone 06/10/2017   Benign prostatic hyperplasia (BPH) with urinary urgency 06/12/2017   No energy 06/12/2017   Former smoker 07/25/2017   Cough 07/25/2017   Low testosterone in male 07/25/2017   Non-restorative sleep 07/25/2017   Anxiety 10/30/2015   Chronic nausea 10/30/2015   Major depressive disorder 10/30/2015   Migraine 10/30/2015   Neck pain 10/30/2015   Occipital neuralgia 10/30/2015    Substance abuse in remission ([place]) 04/07/2018   Bipolar disorder ([place]) 07/04/2018   Hearing difficulty of right ear 01/16/2019   IFG (impaired fasting glucose) 03/20/2019   Coronary artery disease due to calcified coronary lesion 11/03/2019   Thoracic aorta atherosclerosis ([place]) 11/03/2019   Aortic atherosclerosis ([place]) 11/03/2019   Hepatic steatosis 11/03/2019   SOB (shortness of breath) on exertion 11/06/2019   Precordial pain 11/06/2019   Essential hypertension 11/06/2019   Obesity (BMI 30-39.9) 11/06/2019   Aneurysm of ascending aorta ([place]) 11/10/2019   Hypoxia 02/21/2020   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/2020   Sinobronchitis 07/10/2020   Decreased cardiac ejection fraction 11/14/2020   Cardiomyopathy ([place]) 11/14/2020   Dyspnea 11/14/2020   Left lower quadrant abdominal pain 88/41/6606   Chronic systolic congestive heart failure, NYHA class 3 ([place]) 05/30/2021   IPF (idiopathic pulmonary fibrosis) ([place]) 06/10/2021   Pre-diabetes 06/10/2021   Class 1 obesity due to excess calories with serious comorbidity and body mass index (BMI) of 31.0 to 31.9 in adult 06/10/2021   Cirrhosis of liver without ascites ([place]) 06/15/2021   Dysfunction of both eustachian tubes 07/08/2021   Eye discharge 08/11/2021   Photophobia of both eyes 08/11/2021   Eye pain, bilateral 08/11/2021   Diarrhea 09/05/2021   Abdominal distention 09/05/2021   Generalized abdominal pain 09/05/2021   Suspicious nevus 11/11/2021   Change in bowel habits    Gastritis and gastroduodenitis    Colon cancer screening  Hematochezia    Grade II internal hemorrhoids    Adenomatous polyp of sigmoid colon    Rotator cuff tendonitis, left 12/24/2021   Sciatica of left side 12/24/2021   ILD (interstitial lung disease) ([place]) 02/09/2022   Chronic respiratory failure with hypoxia (HCC) 02/10/2022   SOB (shortness of breath) 02/10/2022   Resolved Ambulatory Problems    Diagnosis Date Noted   Essential hypertension,  benign 05/19/2016   Community acquired pneumonia of left lower lobe of lung 11/20/2016   Impacted cerumen of right ear 01/16/2019   Past Medical History:  Diagnosis Date   Arthritis    Hypertension      ROS See HPI.    Objective:     BP (!) 148/82   Pulse 91   Ht '5\' 11"'$  (1.803 m)   Wt 229 lb (103.9 kg)   SpO2 93%   BMI 31.94 kg/m  BP Readings from Last 3 Encounters:  02/09/22 (!) 148/82  02/03/22 126/70  01/26/22 136/78   Wt Readings from Last 3 Encounters:  02/09/22 229 lb (103.9 kg)  02/03/22 231 lb 6.4 oz (105 kg)  01/26/22 234 lb 1.3 oz (106.2 kg)      Physical Exam Vitals reviewed.  Constitutional:      Appearance: Normal appearance. He is obese.  Cardiovascular:     Rate and Rhythm: Normal rate and regular rhythm.     Pulses: Normal pulses.  Pulmonary:     Comments: Labored breathing on 4L of O2 continuous.  Musculoskeletal:     Right lower leg: No edema.     Left lower leg: No edema.  Neurological:     General: No focal deficit present.     Mental Status: He is alert and oriented to person, place, and time.  Psychiatric:        Mood and Affect: Mood normal.       Assessment & Plan:  [name] KitchenMarland KitchenPhong was seen today for follow-up.  Diagnoses and all orders for this visit:  ILD (interstitial lung disease) ([place]) -     sodium bicarbonate 650 MG tablet; TAKE ONE TABLET BY MOUTH 4 TIMES DAILY .  SOB (shortness of breath) -     albuterol (VENTOLIN HFA) 108 (90 Base) MCG/ACT inhaler; Inhale 2 puffs into the lungs every 6 (six) hours as needed.  Hypoxia -     albuterol (VENTOLIN HFA) 108 (90 Base) MCG/ACT inhaler; Inhale 2 puffs into the lungs every 6 (six) hours as needed.  Restrictive airway disease -     albuterol (VENTOLIN HFA) 108 (90 Base) MCG/ACT inhaler; Inhale 2 puffs into the lungs every 6 (six) hours as needed.  Class 1 obesity due to excess calories with serious comorbidity and body mass index (BMI) of 31.0 to 31.9 in adult -     WEGOVY  0.25 MG/0.5ML SOAJ; Inject 0.25 mg into the skin once a week. Use this dose for 1 month (4 shots) and then increase to next higher dose. -     WEGOVY 0.5 MG/0.5ML SOAJ; Inject 0.5 mg into the skin once a week. Use this dose for 1 month (4 shots) and then increase to next higher dose. -     WEGOVY 1 MG/0.5ML SOAJ; Inject 1 mg into the skin once a week. Use this dose for 1 month (4 shots) and then increase to next higher dose.  Chronic systolic congestive heart failure, NYHA class 3 (HCC) -     sodium bicarbonate 650 MG tablet; TAKE ONE TABLET BY  MOUTH 4 TIMES DAILY .  Chronic respiratory failure with hypoxia (HCC) -     sodium bicarbonate 650 MG tablet; TAKE ONE TABLET BY MOUTH 4 TIMES DAILY .   Pt cannot exercise at this point due to his hypoxic state Nutrition is going to be goal with weight loss Referral placed to discuss with nutritionist Discussed mediterranean diet and printed pdf Limit sodium to '1500mg'$  a day Limit water to 40oz a day Start wegovy Discussed side effects and titration Sent .25 and .'5mg'$  doses  Follow up in 3 months  Goal is to lose weight to be ready for lung transplant Pulmonology is trying to get TKI approved and affordable He has been referred to [facility] for a specialist and lung transplant consideration  Goal to get CPAP distributor and oxygen distributor the same Will send O2 supplies to [employer] Pt at rest with 4L of O2 is 93 percent.  Pt O2 stats drop below 82 percent on room air at rest without O2 supplementation.    Spent 40 minutes with patient reviewing chart, discussing prior testing and orders, discussing weight loss and nutrition goals.     Return in about 3 months (around 05/12/2022), or if symptoms worsen or fail to improve.    [name], PA-C

## 2022-02-10 ENCOUNTER — Other Ambulatory Visit: Payer: Self-pay | Admitting: Physician Assistant

## 2022-02-10 ENCOUNTER — Encounter: Payer: Self-pay | Admitting: Physician Assistant

## 2022-02-10 ENCOUNTER — Other Ambulatory Visit (HOSPITAL_COMMUNITY): Payer: Self-pay

## 2022-02-10 DIAGNOSIS — R0602 Shortness of breath: Secondary | ICD-10-CM | POA: Insufficient documentation

## 2022-02-10 DIAGNOSIS — J9611 Chronic respiratory failure with hypoxia: Secondary | ICD-10-CM | POA: Insufficient documentation

## 2022-02-10 LAB — BRAIN NATRIURETIC PEPTIDE: Pro B Natriuretic peptide (BNP): 28 pg/mL (ref 0.0–100.0)

## 2022-02-10 NOTE — Telephone Encounter (Signed)
Submitted Patient Assistance Application to [employer] for OFEV along with provider portion, PA and income documents. Will update patient when we receive a response.  Fax# [phone] Phone# [phone] 

## 2022-02-10 NOTE — Patient Instructions (Signed)
Nutrition referral O2 supplies sent to [employer] Start wegovy

## 2022-02-10 NOTE — Telephone Encounter (Signed)
Spoke with pt, echo scheduled 02/12/22 @ 7:30 am.

## 2022-02-11 ENCOUNTER — Encounter: Payer: Self-pay | Admitting: Cardiology

## 2022-02-11 ENCOUNTER — Telehealth (INDEPENDENT_AMBULATORY_CARE_PROVIDER_SITE_OTHER): Payer: Medicare HMO | Admitting: Cardiology

## 2022-02-11 VITALS — BP 113/66 | HR 85 | Ht 71.0 in | Wt 228.6 lb

## 2022-02-11 DIAGNOSIS — I420 Dilated cardiomyopathy: Secondary | ICD-10-CM

## 2022-02-11 DIAGNOSIS — I272 Pulmonary hypertension, unspecified: Secondary | ICD-10-CM | POA: Diagnosis not present

## 2022-02-11 DIAGNOSIS — R0602 Shortness of breath: Secondary | ICD-10-CM | POA: Diagnosis not present

## 2022-02-11 DIAGNOSIS — R55 Syncope and collapse: Secondary | ICD-10-CM

## 2022-02-11 DIAGNOSIS — I5022 Chronic systolic (congestive) heart failure: Secondary | ICD-10-CM | POA: Diagnosis not present

## 2022-02-11 MED ORDER — SODIUM CHLORIDE 0.9% FLUSH: 3.0000 mL | Freq: Two times a day (BID) | INTRAVENOUS | Status: DC

## 2022-02-11 NOTE — Patient Instructions (Signed)
  [facility] [place] [address] Dept: [phone] Loc: [phone]  Jake [name]  02/11/2022  You are scheduled for a Cardiac Catheterization on Friday, June 9 with Dr. Lauree Chandler.  1. Please arrive at the Main Entrance A at [facility]: [place], [place] [address] at 7:00 AM (This time is two hours before your procedure to ensure your preparation). Free valet parking service is available.   Special note: Every effort is made to have your procedure done on time. Please understand that emergencies sometimes delay scheduled procedures.  2. Diet: Do not eat solid foods after midnight.  You may have clear liquids until 5 AM upon the day of the procedure.  3. Labs: NOT NEEDED  4. Medication instructions in preparation for your procedure:  On the morning of your procedure, take any morning medicines NOT listed above.  You may use sips of water.  5. Plan to go home the same day, you will only stay overnight if medically necessary. 6. You MUST have a responsible adult to drive you home. 7. An adult MUST be with you the first 24 hours after you arrive home. 8. Bring a current list of your medications, and the last time and date medication taken. 9. Bring ID and current insurance cards. 10.Please wear clothes that are easy to get on and off and wear slip-on shoes.  Thank you for allowing [place] to care for you!   -- [facility]

## 2022-02-12 ENCOUNTER — Telehealth: Payer: Self-pay | Admitting: Internal Medicine

## 2022-02-12 ENCOUNTER — Telehealth: Payer: Self-pay | Admitting: *Deleted

## 2022-02-12 ENCOUNTER — Ambulatory Visit (HOSPITAL_BASED_OUTPATIENT_CLINIC_OR_DEPARTMENT_OTHER)
Admission: RE | Admit: 2022-02-12 | Discharge: 2022-02-12 | Disposition: A | Discharge status: Home / Self Care | Payer: Medicare HMO | Source: Ambulatory Visit | Attending: Cardiology | Admitting: Cardiology

## 2022-02-12 DIAGNOSIS — I5022 Chronic systolic (congestive) heart failure: Secondary | ICD-10-CM | POA: Insufficient documentation

## 2022-02-12 LAB — MYOSITIS SPECIFIC II ANTIBODIES PANEL
EJ AB: <11 SI (ref <11)
JO-1 AB: <11 SI (ref <11)
MDA-5 AB: <11 SI (ref <11)
MI-2 ALPHA AB: <11 SI (ref <11)
MI-2 BETA AB: <11 SI (ref <11)
NXP-2 AB: <11 SI (ref <11)
OJ AB: <11 SI (ref <11)
PL-12 AB: <11 SI (ref <11)
PL-7 AB: <11 SI (ref <11)
SRP-AB: <11 SI (ref <11)
TIF-1y AB: <11 SI (ref <11)

## 2022-02-12 LAB — ECHOCARDIOGRAM COMPLETE
AR max vel: 2.3 cm2
AV Area VTI: 2.36 cm2
AV Area mean vel: 2.32 cm2
AV Mean grad: 5 mmHg
AV Peak grad: 9.2 mmHg
Ao pk vel: 1.52 m/s
Area-P 1/2: 4.31 cm2
Calc EF: 48 %
S' Lateral: 3.8 cm
Single Plane A2C EF: 54.4 %
Single Plane A4C EF: 41.4 %

## 2022-02-12 LAB — QUANTIFERON-TB GOLD PLUS
Mitogen-NIL: 2.63 IU/mL
NIL: 0.01 IU/mL
QuantiFERON-TB Gold Plus: NEGATIVE
TB1-NIL: 0 IU/mL
TB2-NIL: 0.01 IU/mL

## 2022-02-12 NOTE — Telephone Encounter (Signed)
Echocardiogram ejection fraction is 40-45%.  Please let him know that I have forwarded the echo report to [facility] lung transplant program.  They actually want to review his echo report before they decide whether they should invite him for an evaluation.  Also noticed that the follow-up with me since May 14, 2022.  This is too far out.  He has just been started on nintedanib.  Nurse practitioner should see him in approximately 4 weeks from now  And I probably should see him end of August 2023 ideally [if not keep the September 7 appointment].

## 2022-02-12 NOTE — Progress Notes (Signed)
  Echocardiogram 2D Echocardiogram has been performed.  Jake [name] F 02/12/2022, 8:48 AM

## 2022-02-12 NOTE — Telephone Encounter (Signed)
Right Heart Cath scheduled at [facility] for: Friday February 13, 2022 9 AM Arrival time and place: [facility] Main Entrance A at: 7 AM    Nothing to eat after midnight prior to procedure, clear liquids until 5 AM day of procedure.  Medication instructions: -Hold:  Chlorthalidone-AM of procedure -Except hold medication usual morning medications can be taken with sips of water.  Confirmed patient has responsible adult to drive home post procedure and be with patient first 24 hours after arriving home.  Patient reports no new symptoms concerning for COVID-19/no exposure to COVID-19 in the past 10 days.  Reviewed procedure instructions with patient.

## 2022-02-13 ENCOUNTER — Encounter (HOSPITAL_COMMUNITY): Payer: Self-pay | Admitting: Cardiovascular Disease

## 2022-02-13 ENCOUNTER — Ambulatory Visit (HOSPITAL_COMMUNITY)
Admission: RE | Admit: 2022-02-13 | Discharge: 2022-02-13 | Disposition: A | Discharge status: Home / Self Care | Payer: Medicare HMO | Attending: Cardiovascular Disease | Admitting: Cardiovascular Disease

## 2022-02-13 ENCOUNTER — Other Ambulatory Visit: Payer: Self-pay

## 2022-02-13 ENCOUNTER — Encounter (HOSPITAL_COMMUNITY)
Admission: RE | Disposition: A | Discharge status: Home / Self Care | Payer: Self-pay | Source: Home / Self Care | Attending: Cardiovascular Disease

## 2022-02-13 DIAGNOSIS — I712 Thoracic aortic aneurysm, without rupture, unspecified: Secondary | ICD-10-CM | POA: Diagnosis not present

## 2022-02-13 DIAGNOSIS — I428 Other cardiomyopathies: Secondary | ICD-10-CM | POA: Diagnosis not present

## 2022-02-13 DIAGNOSIS — Z87891 Personal history of nicotine dependence: Secondary | ICD-10-CM | POA: Insufficient documentation

## 2022-02-13 DIAGNOSIS — I110 Hypertensive heart disease with heart failure: Secondary | ICD-10-CM | POA: Diagnosis not present

## 2022-02-13 DIAGNOSIS — G4733 Obstructive sleep apnea (adult) (pediatric): Secondary | ICD-10-CM | POA: Diagnosis not present

## 2022-02-13 DIAGNOSIS — R0602 Shortness of breath: Secondary | ICD-10-CM | POA: Insufficient documentation

## 2022-02-13 DIAGNOSIS — E785 Hyperlipidemia, unspecified: Secondary | ICD-10-CM | POA: Diagnosis not present

## 2022-02-13 DIAGNOSIS — I2510 Atherosclerotic heart disease of native coronary artery without angina pectoris: Secondary | ICD-10-CM | POA: Insufficient documentation

## 2022-02-13 DIAGNOSIS — I5042 Chronic combined systolic (congestive) and diastolic (congestive) heart failure: Secondary | ICD-10-CM | POA: Insufficient documentation

## 2022-02-13 DIAGNOSIS — R55 Syncope and collapse: Secondary | ICD-10-CM | POA: Diagnosis not present

## 2022-02-13 DIAGNOSIS — J849 Interstitial pulmonary disease, unspecified: Secondary | ICD-10-CM | POA: Diagnosis not present

## 2022-02-13 HISTORY — PX: RIGHT HEART CATH: CATH118263

## 2022-02-13 LAB — POCT I-STAT EG7
Acid-Base Excess: 8 mmol/L — ABNORMAL HIGH (ref 0.0–2.0)
Acid-Base Excess: 9 mmol/L — ABNORMAL HIGH (ref 0.0–2.0)
Bicarbonate: 34.2 mmol/L — ABNORMAL HIGH (ref 20.0–28.0)
Bicarbonate: 35.6 mmol/L — ABNORMAL HIGH (ref 20.0–28.0)
Calcium, Ion: 1.13 mmol/L — ABNORMAL LOW (ref 1.15–1.40)
Calcium, Ion: 1.21 mmol/L (ref 1.15–1.40)
HCT: 34 % — ABNORMAL LOW (ref 39.0–52.0)
HCT: 35 % — ABNORMAL LOW (ref 39.0–52.0)
Hemoglobin: 11.6 g/dL — ABNORMAL LOW (ref 13.0–17.0)
Hemoglobin: 11.9 g/dL — ABNORMAL LOW (ref 13.0–17.0)
O2 Saturation: 69 %
O2 Saturation: 70 %
Potassium: 2.8 mmol/L — ABNORMAL LOW (ref 3.5–5.1)
Potassium: 3 mmol/L — ABNORMAL LOW (ref 3.5–5.1)
Sodium: 137 mmol/L (ref 135–145)
Sodium: 138 mmol/L (ref 135–145)
TCO2: 36 mmol/L — ABNORMAL HIGH (ref 22–32)
TCO2: 37 mmol/L — ABNORMAL HIGH (ref 22–32)
pCO2, Ven: 55.3 mmHg (ref 44–60)
pCO2, Ven: 56.9 mmHg (ref 44–60)
pH, Ven: 7.399 (ref 7.25–7.43)
pH, Ven: 7.404 (ref 7.25–7.43)
pO2, Ven: 37 mmHg (ref 32–45)
pO2, Ven: 38 mmHg (ref 32–45)

## 2022-02-13 SURGERY — RIGHT HEART CATH

## 2022-02-13 MED ORDER — LABETALOL HCL 5 MG/ML IV SOLN: 10.0000 mg | INTRAVENOUS | Status: DC | PRN

## 2022-02-13 MED ORDER — SODIUM CHLORIDE 0.9 % WEIGHT BASED INFUSION: 3.0000 mL/kg/h | INTRAVENOUS | Status: AC

## 2022-02-13 MED ORDER — SODIUM CHLORIDE 0.9 % IV SOLN
250.0000 mL | INTRAVENOUS | Status: DC | PRN
Start: 2022-02-13 — End: 2022-02-13

## 2022-02-13 MED ORDER — SODIUM CHLORIDE 0.9% FLUSH: 3.0000 mL | Freq: Two times a day (BID) | INTRAVENOUS | Status: DC

## 2022-02-13 MED ORDER — SODIUM CHLORIDE 0.9 % WEIGHT BASED INFUSION: 1.0000 mL/kg/h | INTRAVENOUS | Status: DC

## 2022-02-13 MED ORDER — HEPARIN (PORCINE) IN NACL 1000-0.9 UT/500ML-% IV SOLN
INTRAVENOUS | Status: AC
  Filled 2022-02-13: qty 1000

## 2022-02-13 MED ORDER — SODIUM CHLORIDE 0.9 % IV SOLN: 250.0000 mL | INTRAVENOUS | Status: DC | PRN

## 2022-02-13 MED ORDER — LIDOCAINE HCL (PF) 1 % IJ SOLN
INTRAMUSCULAR | Status: DC | PRN
  Administered 2022-02-13: 2 mL

## 2022-02-13 MED ORDER — LIDOCAINE HCL (PF) 1 % IJ SOLN
INTRAMUSCULAR | Status: AC
  Filled 2022-02-13: qty 30

## 2022-02-13 MED ORDER — MIDAZOLAM HCL 2 MG/2ML IJ SOLN
INTRAMUSCULAR | Status: AC
  Filled 2022-02-13: qty 2

## 2022-02-13 MED ORDER — ASPIRIN 81 MG PO CHEW: 81.0000 mg | CHEWABLE_TABLET | ORAL | Status: DC

## 2022-02-13 MED ORDER — HYDRALAZINE HCL 20 MG/ML IJ SOLN: 10.0000 mg | INTRAMUSCULAR | Status: DC | PRN

## 2022-02-13 MED ORDER — ACETAMINOPHEN 325 MG PO TABS: 650.0000 mg | ORAL_TABLET | ORAL | Status: DC | PRN

## 2022-02-13 MED ORDER — SODIUM CHLORIDE 0.9% FLUSH
3.0000 mL | INTRAVENOUS | Status: DC | PRN
Start: 2022-02-13 — End: 2022-02-13

## 2022-02-13 MED ORDER — MIDAZOLAM HCL 2 MG/2ML IJ SOLN
INTRAMUSCULAR | Status: DC | PRN
  Administered 2022-02-13: 1 mg via INTRAVENOUS

## 2022-02-13 MED ORDER — SODIUM CHLORIDE 0.9% FLUSH: 3.0000 mL | INTRAVENOUS | Status: DC | PRN

## 2022-02-13 MED ORDER — HEPARIN (PORCINE) IN NACL 1000-0.9 UT/500ML-% IV SOLN
INTRAVENOUS | Status: DC | PRN
  Administered 2022-02-13 (×2): 500 mL

## 2022-02-13 MED ORDER — ONDANSETRON HCL 4 MG/2ML IJ SOLN: 4.0000 mg | Freq: Four times a day (QID) | INTRAMUSCULAR | Status: DC | PRN

## 2022-02-13 SURGICAL SUPPLY — 6 items
CATH BALLN WEDGE 5F 110CM (CATHETERS) ×1 IMPLANT
ELECT DEFIB PAD ADLT CADENCE (PAD) ×1 IMPLANT
PACK CARDIAC CATHETERIZATION (CUSTOM PROCEDURE TRAY) ×1 IMPLANT
PROTECTION STATION PRESSURIZED (MISCELLANEOUS) ×2
SHEATH GLIDE SLENDER 4/5FR (SHEATH) ×1 IMPLANT
STATION PROTECTION PRESSURIZED (MISCELLANEOUS) IMPLANT

## 2022-02-13 NOTE — Telephone Encounter (Signed)
Pt is currently admitted at [facility] for heart cath procedure. Unable to contact him to discuss price of Ofev at this time. Will attempt f/u once discharged.

## 2022-02-13 NOTE — Telephone Encounter (Signed)
Received fax from [facility] stating income documentation is missing. Spoke with patient recommending he follow-up with them to see what income proof can be provided for his wife since she does not have 3 paychecks yet as it is new job. He will plan to call today.  I am not sure if pharmacy team has Ofev samples to provide. Will have to call patient back on Monday once I am onsite.  [name], PharmD, MPH, BCPS, CPP Clinical Pharmacist (Rheumatology and Pulmonology)

## 2022-02-13 NOTE — Telephone Encounter (Signed)
Separate encounter has been sent to pharmacy team 6/9. Closing this encounter.

## 2022-02-13 NOTE — Telephone Encounter (Signed)
Received fax from [facility] stating income documentation is missing. Spoke with patient recommending he follow-up with them to see what income proof can be provided for his wife since she does not have 3 paychecks yet as it is new job. He will plan to call today.  I am not sure if pharmacy team has Ofev samples to provide. Will have to call patient back on Monday once I am onsite.  [name], PharmD, MPH, BCPS, CPP Clinical Pharmacist (Rheumatology and Pulmonology)

## 2022-02-13 NOTE — Interval H&P Note (Signed)
History and Physical Interval Note:  02/13/2022 8:53 AM  Jake [name]  has presented today for surgery, with the diagnosis of lung disease.  The various methods of treatment have been discussed with the patient and family. After consideration of risks, benefits and other options for treatment, the patient has consented to  Procedure(s): RIGHT HEART CATH (N/A) as a surgical intervention.  The patient's history has been reviewed, patient examined, no change in status, stable for surgery.  I have reviewed the patient's chart and labs.  Questions were answered to the patient's satisfaction.     Lauree Chandler

## 2022-02-13 NOTE — Telephone Encounter (Signed)
Devki/Rache;l  Can you guys please look at starting Cheral Marker with ofev donor samples to save time?  Thanks    SIGNATURE    Dr. Brand Males, M.D., F.C.C.P,  Pulmonary and Critical Care Medicine Staff Physician, [facility] Director - Interstitial Lung Disease  Program  Medical Director - [facility] [place] at [place], [place], [address]  NPI Number:  NPI #[id number] [facility] Number: [id number]  Pager: [phone], If no answer  -[place] or Try [phone] Telephone (clinical office): [phone] Telephone (research): [phone]  3:19 PM 02/13/2022

## 2022-02-13 NOTE — Telephone Encounter (Signed)
Called and spoke with pt letting him know the info per MR about echocardiogram and he verbalized understanding. I have scheduled pt a follow up with [facility] in July but while speaking with pt, pt stated that he has not heard anything about starting on OFEV. Routing to pharmacy team for them to review.

## 2022-02-13 NOTE — Progress Notes (Signed)
Spoke with Jake [name] about patient having N+V with aspirin- she stated to hold giving.

## 2022-02-13 NOTE — Progress Notes (Signed)
Patient and wife was given discharge instructions. Both verbalized understanding. 

## 2022-02-16 ENCOUNTER — Telehealth: Payer: Self-pay | Admitting: Neurology

## 2022-02-16 ENCOUNTER — Other Ambulatory Visit: Payer: Self-pay | Admitting: Physician Assistant

## 2022-02-16 DIAGNOSIS — J849 Interstitial pulmonary disease, unspecified: Secondary | ICD-10-CM

## 2022-02-16 DIAGNOSIS — J9611 Chronic respiratory failure with hypoxia: Secondary | ICD-10-CM

## 2022-02-16 MED ORDER — POTASSIUM CHLORIDE CRYS ER 10 MEQ PO TBCR: 10.0000 meq | EXTENDED_RELEASE_TABLET | Freq: Two times a day (BID) | ORAL | 1 refills | Status: DC

## 2022-02-16 NOTE — Telephone Encounter (Signed)
Spoke with patient. He will plan to have his wife pick up Ofev sample tomorrow afternoon 12:30p. He states that he has raised funding through family, but I advised that the best option at this time is to start medication using donor supply, assess how he tolerates in regards to side effects, and wait to hear back from pt assistance program. He spoke with [employer] last Friday, 02/13/22 and was told they will move application forward given his income proof situation.  Will continue to f/u with [employer]   [name], PharmD, MPH, BCPS, CPP Clinical Pharmacist (Rheumatology and Pulmonology)

## 2022-02-16 NOTE — Telephone Encounter (Signed)
Patient left vm asking where his oxygen was sent and also where [facility] was sent. Please follow up with patient. [phone].

## 2022-02-16 NOTE — Progress Notes (Signed)
Potassium low. Sent 90mq twice daily recheck lab only in 3-4 days with ferritin and serum iron.

## 2022-02-16 NOTE — Telephone Encounter (Signed)
Please advise on samples.

## 2022-02-16 NOTE — Telephone Encounter (Signed)
Spoke with patient. He will plan to have his wife pick up Ofev sample tomorrow afternoon 12:30p. He states that he has raised funding through family, but I advised that the best option at this time is to start medication using donor supply, assess how he tolerates in regards to side effects, and wait to hear back from pt assistance program. He spoke with [employer] last Friday, 02/13/22 and was told they will move application forward given his income proof situation.   Will continue to f/u with [employer]    [name], PharmD, MPH, BCPS, CPP Clinical Pharmacist (Rheumatology and Pulmonology)

## 2022-02-17 ENCOUNTER — Other Ambulatory Visit: Payer: Self-pay | Admitting: Pulmonary Disease

## 2022-02-17 ENCOUNTER — Other Ambulatory Visit: Payer: Self-pay | Admitting: Physician Assistant

## 2022-02-17 DIAGNOSIS — N401 Enlarged prostate with lower urinary tract symptoms: Secondary | ICD-10-CM

## 2022-02-17 LAB — MYOMARKER 3 PLUS PROFILE (RDL)

## 2022-02-17 NOTE — Telephone Encounter (Signed)
Patient picked up Ofev '150mg'$  donor supply (unopened bottle) #60 capsules Directions: take one capsule by mouth twice daily  Briefly reviewed side effects with patient  [name], PharmD, MPH, BCPS, CPP Clinical Pharmacist (Rheumatology and Pulmonology)

## 2022-02-17 NOTE — Addendum Note (Signed)
Addended byAnnamaria Helling on: 02/17/2022 04:30 PM   Modules accepted: Orders

## 2022-02-17 NOTE — Telephone Encounter (Signed)
Jake [name] - please add O2 flow to order and I will resend information to [employer].

## 2022-02-17 NOTE — Telephone Encounter (Addendum)
Raquel Sarna with [employer] left vm stating she received order for oxygen but liter flow and last office note weren't included in the information faxed. Need resent to [phone]. Phone number [phone].

## 2022-02-18 ENCOUNTER — Telehealth (HOSPITAL_COMMUNITY): Payer: Self-pay

## 2022-02-18 NOTE — Telephone Encounter (Signed)
Pt is not interested in the pulmonary rehab program. Closed referral. 

## 2022-02-18 NOTE — Telephone Encounter (Signed)
Done

## 2022-02-18 NOTE — Telephone Encounter (Signed)
Order and last note resent.

## 2022-02-18 NOTE — Addendum Note (Signed)
Addended by: Donella Stade on: 02/18/2022 07:05 AM   Modules accepted: Orders

## 2022-02-24 DIAGNOSIS — I10 Essential (primary) hypertension: Secondary | ICD-10-CM | POA: Diagnosis not present

## 2022-02-24 DIAGNOSIS — R0902 Hypoxemia: Secondary | ICD-10-CM | POA: Diagnosis not present

## 2022-02-24 NOTE — Telephone Encounter (Signed)
Contacted [employer] regarding Ofev patient assistance. Per rep, Ofev patient assistance is APPROVED starting 02/24/22 until 09/06/2022. Patient must reapply every calendar year.   Attempted to contact patient to inform patient to call [employer] [phone]); however, his and his wife's lines were busy.   Joseph Art, Pharm.D. PGY-1 Pharmacy Resident 02/24/2022 2:42 PM

## 2022-02-24 NOTE — Telephone Encounter (Signed)
Called patient and advised of approval. He will plan to call [employer] to schedule shipment.  He sates he is tolerating Ofev without serious side effects. Does report nausea for several hours after taking dose in morning. Minimal nausea with evening dose. Confirms he takes medication with food and is taking ondansetron as well. Eats yogurt, banana, and bagel for breakfast. He does report chronic nausea since his colectomy. He reports one or two loose stools. He states that side effects are tolerable.  [name], PharmD, MPH, BCPS, CPP Clinical Pharmacist (Rheumatology and Pulmonology)

## 2022-02-25 DIAGNOSIS — J849 Interstitial pulmonary disease, unspecified: Secondary | ICD-10-CM | POA: Diagnosis not present

## 2022-03-04 LAB — MYOMARKER 3 PLUS PROFILE (RDL)

## 2022-03-12 ENCOUNTER — Ambulatory Visit: Payer: Medicare HMO | Admitting: Nurse Practitioner

## 2022-03-12 ENCOUNTER — Encounter: Payer: Self-pay | Admitting: Nurse Practitioner

## 2022-03-12 ENCOUNTER — Telehealth: Payer: Self-pay

## 2022-03-12 ENCOUNTER — Other Ambulatory Visit (HOSPITAL_COMMUNITY): Payer: Self-pay

## 2022-03-12 VITALS — BP 104/68 | HR 81 | Temp 98.0°F | Ht 71.0 in | Wt 232.8 lb

## 2022-03-12 DIAGNOSIS — J9611 Chronic respiratory failure with hypoxia: Secondary | ICD-10-CM

## 2022-03-12 DIAGNOSIS — J849 Interstitial pulmonary disease, unspecified: Secondary | ICD-10-CM | POA: Diagnosis not present

## 2022-03-12 DIAGNOSIS — I5022 Chronic systolic (congestive) heart failure: Secondary | ICD-10-CM

## 2022-03-12 LAB — CBC WITH DIFFERENTIAL/PLATELET
Basophils Absolute: 0.1 10*3/uL (ref 0.0–0.1)
Basophils Relative: 0.8 % (ref 0.0–3.0)
Eosinophils Absolute: 0.3 10*3/uL (ref 0.0–0.7)
Eosinophils Relative: 3.8 % (ref 0.0–5.0)
HCT: 38.8 % — ABNORMAL LOW (ref 39.0–52.0)
Hemoglobin: 13 g/dL (ref 13.0–17.0)
Lymphocytes Relative: 18.5 % (ref 12.0–46.0)
Lymphs Abs: 1.6 10*3/uL (ref 0.7–4.0)
MCHC: 33.5 g/dL (ref 30.0–36.0)
MCV: 81.8 fl (ref 78.0–100.0)
Monocytes Absolute: 0.9 10*3/uL (ref 0.1–1.0)
Monocytes Relative: 10.8 % (ref 3.0–12.0)
Neutro Abs: 5.7 10*3/uL (ref 1.4–7.7)
Neutrophils Relative %: 66.1 % (ref 43.0–77.0)
Platelets: 293 10*3/uL (ref 150.0–400.0)
RBC: 4.75 Mil/uL (ref 4.22–5.81)
RDW: 15.6 % — ABNORMAL HIGH (ref 11.5–15.5)
WBC: 8.6 10*3/uL (ref 4.0–10.5)

## 2022-03-12 LAB — COMPREHENSIVE METABOLIC PANEL
ALT: 31 U/L (ref 0–53)
AST: 25 U/L (ref 0–37)
Albumin: 4.9 g/dL (ref 3.5–5.2)
Alkaline Phosphatase: 56 U/L (ref 39–117)
BUN: 33 mg/dL — ABNORMAL HIGH (ref 6–23)
CO2: 35 mEq/L — ABNORMAL HIGH (ref 19–32)
Calcium: 10.3 mg/dL (ref 8.4–10.5)
Chloride: 93 mEq/L — ABNORMAL LOW (ref 96–112)
Creatinine, Ser: 1.14 mg/dL (ref 0.40–1.50)
GFR: 69.57 mL/min (ref >60.00)
Glucose, Bld: 102 mg/dL — ABNORMAL HIGH (ref 70–99)
Potassium: 3.9 mEq/L (ref 3.5–5.1)
Sodium: 135 mEq/L (ref 135–145)
Total Bilirubin: 0.4 mg/dL (ref 0.2–1.2)
Total Protein: 7.8 g/dL (ref 6.0–8.3)

## 2022-03-12 NOTE — Telephone Encounter (Signed)
Received notification that pt will be switching from Ofev to Esbriet. Nurse states she will place the [employer] paperwork into our mailbox. Pt has Medicare so will apply for name-brand Esbriet to accommodate PAP eligibility.  Submitted a Prior Authorization request to [facility] for ESBRIET via CoverMyMeds. Will update once we receive a response.   Key: [id number]

## 2022-03-12 NOTE — Progress Notes (Signed)
@Patient  ID: Jake [name], male    DOB: 1961/04/06, 61 y.o.   MRN: [id number]  Chief Complaint  Patient presents with   Follow-up    Patient states that ofev is horrible. Patient states that his nausea is really bad after taking the ofev.     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 He is a patient of Dr. Golden Pop and last seen in office on 02/03/2022. Past medical history significant for migraine, CAD, atherosclerosis, HTN, cardiomyopathy, CHF, OSA on CPAP, GERD, cirrhosis, HLD, GAD, BPH, MDD, obesity.   TEST/EVENTS:  02/26/2021 PFTs: FVC 49, FEV1 60, ratio 91, TLC 62, DLCOcor 58 01/15/2022 PFTs: FVC 38, FEV1 49, ratio 90, TLC 60, DLCOcor 49  01/16/2022 HRCT Chest: atherosclerosis. Enlarged pulmonary artery, measuring up to 3.6 cm. No LAD. There is probable mild pulmonary fibrosis in a pattern without clear apical to basal gradient, primarily featuring irregular peripheral interstitial opacity, some septal thickening, and ground-glass, with scattered areas of acute ground-glass airspace opacity throughout, particularly in the upper lobes. Not UIP. Mild varicoid BTX in lung bases. Lobular air trapping present.  02/12/2022 echocardiogram: EF 40-45%. G1DD. Mild MR.  02/13/2022 right heart cath: normal pressures  02/03/2022: OV with Dr. Chase Caller.  Previously a patient of Dr Judson Roch and transferred care for ILD.  He has had an insidious onset of shortness of breath over the last few years.  Initially developed a respiratory infection in 2020 (in retrospect, thought possibly to have been COVID), and has had progressive shortness of breath since then.  Feels like over the last year especially he has gotten worse he is gone from requiring 2 L of oxygen to now 4 L.  He will still desaturate into the 50s to 70s at home.  He was recently tried on prednisone for possible  hypersensitivity pneumonitis; however, he was unable to tolerate this and was having frequent emotional/irate outbursts.  ILD on current CT does not appear overwhelming; however, he has had a significant decline in FVC when compared to a year ago and significant clinical decline as well.  ILD pattern is not consistent with UIP/IPF although he seems to be progressing and therefore has a progressive phenotype non-IPF.  He ideally needs a lung biopsy but given his significant hypoxia and current lung status is too risky.  Clinically suggests hypersensitive pneumonitis; previous occupational and mild exposures.  ESR elevated. Concerned that recalled [facility] CPAP machine was the catalyst to his progression by causing lung injury.  Regardless of etiology, given the progressive phenotype recommended starting him on antifibrotics with Ofev. He was also referred to [facility]. Concern for possible ILD PAH as well-referred for right heart cath. Referred to pulmonary rehab.   03/12/2022: Today - follow up Patient presents today with wife after being started on Ofev. He is having significant nausea with his first dose of Ofev. He wakes up in the morning, takes his zofran and eats a small snack. He then takes his Ofev at 8 am with food as well, and has tried multiple different food options. He then becomes extremely nauseated around 845 and has to go lay down until he is able to take his second dose of Zofran around 1/2 pm. He finally begins to feel better and is able to tolerate his second dose. He is understandably frustrated by this and does not wish to have this poor quality  of life from his medications. He denies any vomiting or diarrhea. He has had some weight gain as he has had to eat so often with the Ofev to try to curve his nausea.   From a respiratory standpoint, he continues to get winded with minimal exertion. He can be changing the bed at home or walk 50 ft to the mailbox and has to sit down  afterwards to rest and catch his breath. He has also had drops in his oxygen into the 70's-80's despite his supplemental 4 lpm so he has been minimally active because of this. No significant cough or wheezing. He has not noticed any difference in his activity tolerance or lung function since starting the Ofev, but has felt so terrible that he's not sure if he would notice much.   Allergies  Allergen Reactions   Fentanyl Other (See Comments)    Avoid opiates Other reaction(s): Other Avoid opiates   Other     04/06/18 Patient is a recovering narcotic abuser and, per wife, is NEVER to receive controlled substances (e.g. Narcotics, sedatives, etc.) Other reaction(s): Unknown 04/06/18 Patient is a recovering narcotic abuser and, per wife, is NEVER to receive controlled substances (e.g. Narcotics, sedatives, etc.)   Aspirin Nausea And Vomiting   Lipitor [Atorvastatin] Nausea And Vomiting    GI side effects.     Prednisone Other (See Comments)     Agitation Can not tolerate oral but can tolerate shots.    Contrast Media [Iodinated Contrast Media] Palpitations    Patient states he tolerates now   Nitroglycerin Nausea And Vomiting    Immunization History  Administered Date(s) Administered   Influenza,inj,Quad PF,6+ Mos 07/16/2014, 07/21/2017, 08/22/2018, 06/13/2019, 06/10/2021   Influenza-Unspecified 07/16/2014, 06/14/2016   PFIZER(Purple Top)SARS-COV-2 Vaccination 11/21/2019, 12/24/2019, 06/23/2020   Td 04/15/2015   Tdap 10/10/2009   Zoster Recombinat (Shingrix) 06/10/2021    Past Medical History:  Diagnosis Date   Arthritis    lower back   Bipolar disorder ([place])    GERD (gastroesophageal reflux disease)    Hyperlipidemia    Hypertension    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    Tobacco History: Social History   Tobacco Use  Smoking Status Former   Packs/day: 1.00   Years: 25.00   Total pack years: 25.00   Types: Cigarettes   Start date: 36    Quit date: 2010   Years since quitting: 13.5  Smokeless Tobacco Never   Counseling given: Not Answered   Outpatient Medications Prior to Visit  Medication Sig Dispense Refill   albuterol (VENTOLIN HFA) 108 (90 Base) MCG/ACT inhaler Inhale 2 puffs into the lungs every 6 (six) hours as needed. 18 g 3   AMBULATORY NON FORMULARY MEDICATION Continuous positive airway pressure (CPAP) machine set at autotitration of H2O pressure, with all supplemental supplies as needed. 1 each 0   chlorthalidone (HYGROTON) 25 MG tablet TAKE 1 TABLET BY MOUTH EVERY DAY 90 tablet 1   colestipol (COLESTID) 1 g tablet Take 1 tablet (1 g total) by mouth daily. 90 tablet 1   dicyclomine (BENTYL) 20 MG tablet Take 1 tablet (20 mg total) by mouth 3 (three) times daily. 270 tablet 0   ENTRESTO 49-51 MG Take 1 tablet by mouth 2 (two) times daily.     finasteride (PROSCAR) 5 MG tablet TAKE 1 TABLET (5 MG TOTAL) BY MOUTH DAILY. 90 tablet 0   fluticasone (FLONASE) 50 MCG/ACT nasal spray Place into both nostrils daily.  ibuprofen (ADVIL) 800 MG tablet TAKE 1 TABLET BY MOUTH EVERY 8 HOURS AS NEEDED 90 tablet 5   loratadine (CLARITIN) 10 MG tablet Take 10 mg by mouth in the morning.     metoprolol succinate (TOPROL-XL) 50 MG 24 hr tablet Take 50 mg by mouth in the morning.     modafinil (PROVIGIL) 200 MG tablet Take 1 tablet (200 mg total) by mouth daily. 30 tablet 5   Nintedanib (OFEV) 150 MG CAPS Take 150 mg by mouth 2 (two) times daily.     ondansetron (ZOFRAN) 8 MG tablet TAKE 1 TABLET BY MOUTH EVERY 8 HOURS AS NEEDED FOR NAUSEA FOR VOMITING 90 tablet 2   pantoprazole (PROTONIX) 40 MG tablet TAKE 1 TABLET BY MOUTH TWICE A DAY 180 tablet 1   potassium chloride (KLOR-CON M) 10 MEQ tablet Take 1 tablet (10 mEq total) by mouth 2 (two) times daily. 60 tablet 1   sertraline (ZOLOFT) 100 MG tablet TAKE 1 & 1/2 (ONE & ONE-HALF) TABLETS BY MOUTH ONCE DAILY (Patient taking differently: Take 150 mg by mouth at bedtime.) 135 tablet 1    sodium bicarbonate 650 MG tablet TAKE ONE TABLET BY MOUTH 4 TIMES DAILY . (Patient taking differently: Take 650 mg by mouth 2 (two) times daily as needed for heartburn (reflux/indigestion.).) 120 tablet 5   triamcinolone (NASACORT) 55 MCG/ACT AERO nasal inhaler Place 2 sprays into the nose daily as needed (allergies (when pollen is very high)).     WEGOVY 0.25 MG/0.5ML SOAJ Inject 0.25 mg into the skin once a week. Use this dose for 1 month (4 shots) and then increase to next higher dose. 2 mL 0   WEGOVY 0.5 MG/0.5ML SOAJ Inject 0.5 mg into the skin once a week. Use this dose for 1 month (4 shots) and then increase to next higher dose. (Patient not taking: Reported on 02/11/2022) 2 mL 0   [START ON 04/11/2022] WEGOVY 1 MG/0.5ML SOAJ Inject 1 mg into the skin once a week. Use this dose for 1 month (4 shots) and then increase to next higher dose. (Patient not taking: Reported on 02/11/2022) 2 mL 0   ZYPITAMAG 2 MG TABS TAKE 1 TABLET BY MOUTH DAILY. (Patient not taking: Reported on 02/11/2022) 30 tablet 5   Facility-Administered Medications Prior to Visit  Medication Dose Route Frequency Provider Last Rate Last Admin   sodium chloride flush (NS) 0.9 % injection 3 mL  3 mL Intravenous Q12H Crenshaw, Denice Bors, MD         Review of Systems: ROS as listed in HPI, otherwise negative     Physical Exam:  BP 104/68 (BP Location: Right Arm, Patient Position: Sitting, Cuff Size: Normal)   Pulse 81   Temp 98 F (36.7 C) (Oral)   Ht 5' 11"  (1.803 m)   Wt 232 lb 12.8 oz (105.6 kg)   SpO2 95%   BMI 32.47 kg/m   GEN: Pleasant, interactive, chronically-ill appearing; obese; in no acute distress. HEENT:  Normocephalic and atraumatic. PERRLA. Sclera white. Nasal turbinates pink, moist and patent bilaterally. No rhinorrhea present. Oropharynx pink and moist, without exudate or edema. No lesions, ulcerations, or postnasal drip.  NECK:  Supple w/ fair ROM. No JVD present.  CV: RRR, no m/r/g, no peripheral edema.  Pulses intact, +2 bilaterally. No cyanosis, pallor or clubbing. PULMONARY:  Unlabored, regular breathing. Bibasilar crackles; otherwise clear bilaterally A&P w/o wheezes/rales/rhonchi. No accessory muscle use. No dullness to percussion. GI: BS present and normoactive. Soft, non-tender to  palpation. No organomegaly or masses detected. No CVA tenderness. MSK: No erythema, warmth or tenderness. Cap refil <2 sec all extrem. No deformities or joint swelling noted.  Neuro: A/Ox3. No focal deficits noted.   Skin: Warm, no lesions or rashe Psych: Normal affect and behavior. Judgement and thought content appropriate.     Lab Results:  CBC    Component Value Date/Time   WBC 8.6 03/12/2022 1237   RBC 4.75 03/12/2022 1237   HGB 13.0 03/12/2022 1237   HCT 38.8 (L) 03/12/2022 1237   PLT 293.0 03/12/2022 1237   MCV 81.8 03/12/2022 1237   MCH 28.5 09/03/2021 1854   MCHC 33.5 03/12/2022 1237   RDW 15.6 (H) 03/12/2022 1237   LYMPHSABS 1.6 03/12/2022 1237   MONOABS 0.9 03/12/2022 1237   EOSABS 0.3 03/12/2022 1237   BASOSABS 0.1 03/12/2022 1237    BMET    Component Value Date/Time   NA 135 03/12/2022 1237   K 3.9 03/12/2022 1237   CL 93 (L) 03/12/2022 1237   CO2 35 (H) 03/12/2022 1237   GLUCOSE 102 (H) 03/12/2022 1237   BUN 33 (H) 03/12/2022 1237   CREATININE 1.14 03/12/2022 1237   CREATININE 1.21 09/12/2021 0000   CALCIUM 10.3 03/12/2022 1237   GFRNONAA >60 09/03/2021 1854   GFRNONAA 60 03/20/2020 1044   GFRAA 69 03/20/2020 1044    BNP    Component Value Date/Time   BNP 31 01/26/2022 1116     Imaging:  CARDIAC CATHETERIZATION  Result Date: 02/13/2022 Normal right heart pressures   ECHOCARDIOGRAM COMPLETE  Result Date: 02/12/2022    ECHOCARDIOGRAM REPORT   Patient Name:   KAEVION SINCLAIR Date of Exam: 02/12/2022 Medical Rec #:  [id number]       Height:       71.0 in Accession #:    [id number]      Weight:       228.6 lb Date of Birth:  19-Oct-1960        BSA:          2.232 m  Patient Age:    51 years        BP:           113/63 mmHg Patient Gender: M               HR:           67 bpm. Exam Location:  [facility] Procedure: 2D Echo, Cardiac Doppler, Color Doppler and 3D Echo Indications:    CHF  History:        Patient has prior history of Echocardiogram examinations, most                 recent 02/09/2021. Lung disease on o2.  Sonographer:    Merrie Roof RDCS Referring Phys: [place]  1. 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 Left ventricular ejection fraction, by estimation, is 40 to 45%. The left ventricle has mildly decreased function. The left ventricle has no regional wall motion abnormalities. Left ventricular diastolic parameters are consistent with Grade I diastolic dysfunction (impaired relaxation).  2. Right ventricular systolic function is normal. The right ventricular size is normal.  3. Left atrial size was moderately dilated.  4. The mitral valve is normal in structure. Mild mitral valve regurgitation. No evidence of mitral stenosis.  5. The aortic valve is normal in structure. Aortic valve regurgitation is not visualized. Aortic valve sclerosis is present, with no evidence  of aortic valve stenosis.  6. The inferior vena cava is normal in size with greater than 50% respiratory variability, suggesting right atrial pressure of 3 mmHg. 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 Left ventricular ejection fraction, by estimation, is 40 to 45%. The left ventricle has mildly decreased function. The left ventricle has no regional wall motion abnormalities. The left ventricular internal cavity size was normal in size. There is no left ventricular hypertrophy. Left ventricular diastolic parameters are consistent with Grade I diastolic dysfunction (impaired relaxation). Right Ventricle: The right ventricular size is normal. No increase in right ventricular wall  thickness. Right ventricular systolic function is normal. Left Atrium: Left atrial size was moderately dilated. Right Atrium: Right atrial size was not well visualized. Pericardium: There is no evidence of pericardial effusion. Mitral Valve: The mitral valve is normal in structure. Mild mitral valve regurgitation. No evidence of mitral valve stenosis. Tricuspid Valve: The tricuspid valve is normal in structure. Tricuspid valve regurgitation is not demonstrated. No evidence of tricuspid stenosis. Aortic Valve: The aortic valve is normal in structure. Aortic valve regurgitation is not visualized. Aortic valve sclerosis is present, with no evidence of aortic valve stenosis. Aortic valve mean gradient measures 5.0 mmHg. Aortic valve peak gradient measures 9.2 mmHg. Aortic valve area, by VTI measures 2.36 cm. Pulmonic Valve: The pulmonic valve was normal in structure. Pulmonic valve regurgitation is not visualized. No evidence of pulmonic stenosis. Aorta: The aortic root is normal in size and structure. Venous: The inferior vena cava is normal in size with greater than 50% respiratory variability, suggesting right atrial pressure of 3 mmHg. IAS/Shunts: No atrial level shunt detected by color flow Doppler.  LEFT VENTRICLE PLAX 2D LVIDd:         5.20 cm      Diastology LVIDs:         3.80 cm      LV e' medial:    5.98 cm/s LV PW:         1.00 cm      LV E/e' medial:  11.8 LV IVS:        1.00 cm      LV e' lateral:   7.72 cm/s LVOT diam:     2.10 cm      LV E/e' lateral: 9.2 LV SV:         71 LV SV Index:   32 LVOT Area:     3.46 cm                              3D Volume EF: LV Volumes (MOD)            3D EF:        38 % LV vol d, MOD A2C: 156.5 ml LV EDV:       184 ml LV vol d, MOD A4C: 203.0 ml LV ESV:       113 ml LV vol s, MOD A2C: 71.3 ml  LV SV:        70 ml LV vol s, MOD A4C: 119.0 ml LV SV MOD A2C:     85.2 ml LV SV MOD A4C:     203.0 ml LV SV MOD BP:      88.1 ml RIGHT VENTRICLE             IVC RV Basal diam:   3.90 cm  IVC diam: 2.40 cm RV S prime:     10.30 cm/s TAPSE (M-mode): 2.2 cm LEFT ATRIUM              Index        RIGHT ATRIUM           Index LA diam:        4.90 cm  2.20 cm/m   RA Area:     23.30 cm LA Vol (A2C):   87.2 ml  39.07 ml/m  RA Volume:   67.20 ml  30.11 ml/m LA Vol (A4C):   93.1 ml  41.72 ml/m LA Biplane Vol: 100.0 ml 44.81 ml/m  AORTIC VALVE AV Area (Vmax):    2.30 cm AV Area (Vmean):   2.32 cm AV Area (VTI):     2.36 cm AV Vmax:           152.00 cm/s AV Vmean:          109.000 cm/s AV VTI:            0.301 m AV Peak Grad:      9.2 mmHg AV Mean Grad:      5.0 mmHg LVOT Vmax:         101.00 cm/s LVOT Vmean:        73.100 cm/s LVOT VTI:          0.205 m LVOT/AV VTI ratio: 0.68  AORTA Ao Root diam: 3.80 cm Ao Asc diam:  3.90 cm MITRAL VALVE MV Area (PHT): 4.31 cm    SHUNTS MV Decel Time: 176 msec    Systemic VTI:  0.20 m MV E velocity: 70.70 cm/s  Systemic Diam: 2.10 cm MV A velocity: 89.50 cm/s MV E/A ratio:  0.79 Jyl Heinz MD Electronically signed by Jyl Heinz MD Signature Date/Time: 02/12/2022/12:43:43 PM    Final          Latest Ref Rng & Units 01/15/2022    3:58 PM 02/26/2021   12:00 PM  PFT Results  FVC-Pre L 1.91  2.44   FVC-Predicted Pre % 38  49   FVC-Post L 1.97  2.35   FVC-Predicted Post % 40  47   Pre FEV1/FVC % % 96  93   Post FEV1/FCV % % 90  91   FEV1-Pre L 1.83  2.27   FEV1-Predicted Pre % 49  60   FEV1-Post L 1.78  2.15   DLCO uncorrected ml/min/mmHg 14.05  16.66   DLCO UNC% % 49  58   DLCO corrected ml/min/mmHg 14.05  16.66   DLCO COR %Predicted % 49  58   DLVA Predicted % 105  101   TLC L 4.36  4.52   TLC % Predicted % 60  62   RV % Predicted % 60  66     No results found for: "NITRICOXIDE"      Assessment & Plan:   ILD (interstitial lung disease) ([place]) Unclear etiology; 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 Recalled CPAP seems to be catalyst to progressive disease state. He was  started on Ofev but has had significant nausea, despite antiemetics. Discussed his case with Dr. Chase Caller. We will stop Ofev and start him on Esbriet; hopefully he is better able to tolerate this. Educated him on potential side effects. Message sent to pharmacy team to initiate process.   He was denied by [facility] due to his heart function. We will send referral to Dr. Remo Lipps  Ovid Curd with [facility] in [place] for second opinion of ILD and possible future transplant eval with their team.   Patient Instructions  Stop Ofev. Start Esbriet 1 pill Three times a day for one week then increase to 2 pills Three times a day for one week then 3 pills Three times a day, which will be your maintenance dose. Take with food. Wear sunscreen while outside.  Continue Albuterol inhaler 2 puffs every 6 hours as needed for shortness of breath or wheezing. Notify if symptoms persist despite rescue inhaler/neb use. Continue claritin 10 mg daily for allergies Continue flonase nasal spray 2 sprays each nostril daily for allergies/nasal congestion Continue protonix 40 mg Twice daily  Continue supplemental oxygen 4 lpm for goal oxygen >88-90%. Go up to 5 lpm with activity and slow your walking pace down  Labs today - CMET, CBC with diff  We will send a referral to [facility] with Dr. Sueanne Margarita  in [place], [place]  Follow up as scheduled with Dr. Chase Caller. If symptoms do not improve or worsen, please contact office for sooner follow up or seek emergency care.     Chronic respiratory failure with hypoxia (HCC) He has had increased O2 requirements over the past few months and quickly gone from 2 lpm to 4 lpm. He is able to maintain his O2 levels on 4 lpm at rest, but desaturates to the 70's-80's with activity per he and his wife. Walking oximetry today - was able to maintain oxygen saturations in the 90's on 5 lpm POC. He walks at a very fast pace so advise he slow down and take his time with activities to reduce  oxygen demand as much as possible. Goal >82-08%  Chronic systolic congestive heart failure, NYHA class 3 (HCC) Appears compensated on exam. Follow up with cardiology as scheduled.    I spent 45 minutes of dedicated to the care of this patient on the date of this encounter to include pre-visit review of records, face-to-face time with the patient discussing conditions above, post visit ordering of testing, clinical documentation with the electronic health record, making appropriate referrals as documented, and communicating necessary findings to members of the patients care team.  Clayton Bibles, NP 03/12/2022  Pt aware and understands NP's role.

## 2022-03-12 NOTE — Assessment & Plan Note (Signed)
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 Recalled CPAP seems to be catalyst to progressive disease state. He was started on Ofev but has had significant nausea, despite antiemetics. Discussed his case with Dr. Chase Caller. We will stop Ofev and start him on Esbriet; hopefully he is better able to tolerate this. Educated him on potential side effects. Message sent to pharmacy team to initiate process.   He was denied by [facility] due to his heart function. We will send referral to Dr. Catalina Antigua with [facility] in [place] for second opinion of ILD and possible future transplant eval with their team.   Patient Instructions  Stop Ofev. Start Esbriet 1 pill Three times a day for one week then increase to 2 pills Three times a day for one week then 3 pills Three times a day, which will be your maintenance dose. Take with food. Wear sunscreen while outside.  Continue Albuterol inhaler 2 puffs every 6 hours as needed for shortness of breath or wheezing. Notify if symptoms persist despite rescue inhaler/neb use. Continue claritin 10 mg daily for allergies Continue flonase nasal spray 2 sprays each nostril daily for allergies/nasal congestion Continue protonix 40 mg Twice daily  Continue supplemental oxygen 4 lpm for goal oxygen >88-90%. Go up to 5 lpm with activity and slow your walking pace down  Labs today - CMET, CBC with diff  We will send a referral to [facility] with Dr. Sueanne Margarita  in [place], [place]  Follow up as scheduled with Dr. Chase Caller. If symptoms do not improve or worsen, please contact office for sooner follow up or seek emergency care.

## 2022-03-12 NOTE — Assessment & Plan Note (Signed)
Appears compensated on exam. Follow up with cardiology as scheduled.  ?

## 2022-03-12 NOTE — Patient Instructions (Addendum)
Stop Ofev. Start Esbriet 1 pill Three times a day for one week then increase to 2 pills Three times a day for one week then 3 pills Three times a day, which will be your maintenance dose. Take with food. Wear sunscreen while outside.  Continue Albuterol inhaler 2 puffs every 6 hours as needed for shortness of breath or wheezing. Notify if symptoms persist despite rescue inhaler/neb use. Continue claritin 10 mg daily for allergies Continue flonase nasal spray 2 sprays each nostril daily for allergies/nasal congestion Continue protonix 40 mg Twice daily  Continue supplemental oxygen 4 lpm for goal oxygen >88-90%. Go up to 5 lpm with activity and slow your walking pace down  Labs today - CMET, CBC with diff  We will send a referral to [facility] with Dr. Sueanne Margarita  in [place], [place]  Follow up as scheduled with Dr. Chase Caller. If symptoms do not improve or worsen, please contact office for sooner follow up or seek emergency care.

## 2022-03-12 NOTE — Telephone Encounter (Signed)
Patient portion of [employer] application received. Provider portion placed in Farina Cobb's mailbox  [name], PharmD, MPH, BCPS, CPP Clinical Pharmacist (Rheumatology and Pulmonology)

## 2022-03-12 NOTE — Assessment & Plan Note (Signed)
He has had increased O2 requirements over the past few months and quickly gone from 2 lpm to 4 lpm. He is able to maintain his O2 levels on 4 lpm at rest, but desaturates to the 70's-80's with activity per he and his wife. Walking oximetry today - was able to maintain oxygen saturations in the 90's on 5 lpm POC. He walks at a very fast pace so advise he slow down and take his time with activities to reduce oxygen demand as much as possible. Goal >88-90%

## 2022-03-12 NOTE — Telephone Encounter (Signed)
Received notification from [facility] regarding a prior authorization for ESBRIET. Authorization has been APPROVED from 09/07/21 to 09/06/22.   Per test claim, copay for 30 days supply is $1080.60  Patient can fill through [place]: [place] pt assistnace application to be received from [place] today (unsure if completed?). PA approval letter placed in PAP pending info folder in pharmacy office.  [name], PharmD, MPH, BCPS, CPP Clinical Pharmacist (Rheumatology and Pulmonology)

## 2022-03-16 ENCOUNTER — Other Ambulatory Visit (HOSPITAL_COMMUNITY): Payer: Self-pay

## 2022-03-16 ENCOUNTER — Encounter: Payer: Self-pay | Admitting: Nurse Practitioner

## 2022-03-16 NOTE — Telephone Encounter (Signed)
Submitted Patient Assistance Application to [employer] for ESBRIET along with provider portion, PA and income documents. Will update patient when we receive a response.  Fax# [phone] Phone# [phone] 

## 2022-03-18 ENCOUNTER — Telehealth: Payer: Self-pay | Admitting: Neurology

## 2022-03-18 NOTE — Telephone Encounter (Signed)
Patient left vm stating he received a call from our office about O2 supplies. He is okay, has received everything he needs. Will call back if needed.

## 2022-03-23 NOTE — Telephone Encounter (Signed)
Patient called back and would like an update on Esbriet mediation- informed patient that the last note was from 7/10.  Please advise. Call back number is [phone].

## 2022-03-24 ENCOUNTER — Other Ambulatory Visit: Payer: Self-pay | Admitting: Physician Assistant

## 2022-03-24 DIAGNOSIS — K580 Irritable bowel syndrome with diarrhea: Secondary | ICD-10-CM

## 2022-03-24 NOTE — Telephone Encounter (Signed)
Received a fax from [place] regarding an approval for [place] patient assistance from 03/23/2022 until patient no longer qualifies due to discontinuation of therapy, changes to patient's current financial status or health insurance and/or patient no longer meets the program eligibility requirements. Called pt and verified that they have been reached by company and medication shipment has been scheduled. Encouraged pt to contact us should they have any additional questions or concerns. Pt verbalized understanding to all. Relevant Documents have been sent to scan center.  [employer] Phone#: [phone] option 5 [employer] Phone#: [phone]   Advised pt that [facility] would be reaching out for clinical f/u.

## 2022-03-27 DIAGNOSIS — J849 Interstitial pulmonary disease, unspecified: Secondary | ICD-10-CM | POA: Diagnosis not present

## 2022-03-30 ENCOUNTER — Other Ambulatory Visit: Payer: Self-pay | Admitting: Physician Assistant

## 2022-03-30 DIAGNOSIS — E6609 Other obesity due to excess calories: Secondary | ICD-10-CM

## 2022-03-30 NOTE — Telephone Encounter (Signed)
PA

## 2022-04-01 ENCOUNTER — Telehealth: Payer: Self-pay | Admitting: Pharmacist

## 2022-04-01 DIAGNOSIS — J849 Interstitial pulmonary disease, unspecified: Secondary | ICD-10-CM

## 2022-04-01 DIAGNOSIS — Z5181 Encounter for therapeutic drug level monitoring: Secondary | ICD-10-CM

## 2022-04-01 MED ORDER — PIRFENIDONE 267 MG PO TABS: 801.0000 mg | ORAL_TABLET | Freq: Three times a day (TID) | ORAL | 4 refills | Status: DC

## 2022-04-01 MED ORDER — PIRFENIDONE 267 MG PO TABS: ORAL_TABLET | ORAL | 0 refills | Status: DC

## 2022-04-01 NOTE — Telephone Encounter (Signed)
Subjective:  Patient called today by [facility] pharmacy team for Esbriet new start.  Patient was last seen by [name] Kitchen, NP on 03/12/22.  Pertinent past medical history includes IPF, migraine, CAD, CHF class 3, cardiomyopathy, atherosclerosis, aortic aneurysm, IBS, GERD (takes pantoprazole twice daily), liver cirrhosis, substance abuse in remission.  He stopped Ofev due to significant nausea despite taking ondansetron. He does report no significant diarrheal episodes while on Ofev. He took Ofev for about a month.  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  He was approved for Esbriet patient assistance on 03/24/22 and spoke with [employer] - was provided with all phone numbers. However patient states he did not receive any calls from [place] or [employer]. He states that he was unsure about if he needed to call.  History of elevated LFTs: No History of diarrhea, nausea, vomiting: Yes  Objective: Allergies  Allergen Reactions   Fentanyl Other (See Comments)    Avoid opiates Other reaction(s): Other Avoid opiates   Other     04/06/18 Patient is a recovering narcotic abuser and, per wife, is NEVER to receive controlled substances (e.g. Narcotics, sedatives, etc.) Other reaction(s): Unknown 04/06/18 Patient is a recovering narcotic abuser and, per wife, is NEVER to receive controlled substances (e.g. Narcotics, sedatives, etc.)   Aspirin Nausea And Vomiting   Lipitor [Atorvastatin] Nausea And Vomiting    GI side effects.     Prednisone Other (See Comments)     Agitation Can not tolerate oral but can tolerate shots.    Contrast Media [Iodinated Contrast Media] Palpitations    Patient states he tolerates now   Nitroglycerin Nausea And Vomiting    Outpatient Encounter Medications as of 04/01/2022  Medication Sig Note   Pirfenidone (ESBRIET) 267 MG TABS MONTH 1:  Take 1 tab three times daily for 7 days, then 2 tabs three times daily for 7 days,  then 3 tabs three times daily thereafter.    Pirfenidone 267 MG TABS Take 3 tablets (801 mg total) by mouth with breakfast, with lunch, and with evening meal.    albuterol (VENTOLIN HFA) 108 (90 Base) MCG/ACT inhaler Inhale 2 puffs into the lungs every 6 (six) hours as needed.    AMBULATORY NON FORMULARY MEDICATION Continuous positive airway pressure (CPAP) machine set at autotitration of H2O pressure, with all supplemental supplies as needed.    chlorthalidone (HYGROTON) 25 MG tablet TAKE 1 TABLET BY MOUTH EVERY DAY    colestipol (COLESTID) 1 g tablet Take 1 tablet (1 g total) by mouth daily.    dicyclomine (BENTYL) 20 MG tablet TAKE 1 TABLET BY MOUTH THREE TIMES A DAY    ENTRESTO 49-51 MG Take 1 tablet by mouth 2 (two) times daily.    finasteride (PROSCAR) 5 MG tablet TAKE 1 TABLET (5 MG TOTAL) BY MOUTH DAILY.    fluticasone (FLONASE) 50 MCG/ACT nasal spray Place into both nostrils daily.    ibuprofen (ADVIL) 800 MG tablet TAKE 1 TABLET BY MOUTH EVERY 8 HOURS AS NEEDED    loratadine (CLARITIN) 10 MG tablet Take 10 mg by mouth in the morning.    metoprolol succinate (TOPROL-XL) 50 MG 24 hr tablet Take 50 mg by mouth in the morning.    modafinil (PROVIGIL) 200 MG tablet Take 1 tablet (200 mg total) by mouth daily.    ondansetron (ZOFRAN) 8 MG tablet TAKE 1 TABLET BY MOUTH EVERY 8 HOURS AS NEEDED FOR NAUSEA FOR VOMITING    pantoprazole (PROTONIX) 40 MG  tablet TAKE 1 TABLET BY MOUTH TWICE A DAY    potassium chloride (KLOR-CON M) 10 MEQ tablet Take 1 tablet (10 mEq total) by mouth 2 (two) times daily.    sertraline (ZOLOFT) 100 MG tablet TAKE 1 & 1/2 (ONE & ONE-HALF) TABLETS BY MOUTH ONCE DAILY (Patient taking differently: Take 150 mg by mouth at bedtime.)    sodium bicarbonate 650 MG tablet TAKE ONE TABLET BY MOUTH 4 TIMES DAILY . (Patient taking differently: Take 650 mg by mouth 2 (two) times daily as needed for heartburn (reflux/indigestion.).)    triamcinolone (NASACORT) 55 MCG/ACT AERO nasal  inhaler Place 2 sprays into the nose daily as needed (allergies (when pollen is very high)). 02/11/2022: On hand   WEGOVY 0.25 MG/0.5ML SOAJ Inject 0.25 mg into the skin once a week. Use this dose for 1 month (4 shots) and then increase to next higher dose.    WEGOVY 0.5 MG/0.5ML SOAJ Inject 0.5 mg into the skin once a week. Use this dose for 1 month (4 shots) and then increase to next higher dose. (Patient not taking: Reported on 02/11/2022)    [START ON 04/11/2022] WEGOVY 1 MG/0.5ML SOAJ Inject 1 mg into the skin once a week. Use this dose for 1 month (4 shots) and then increase to next higher dose. (Patient not taking: Reported on 02/11/2022)    ZYPITAMAG 2 MG TABS TAKE 1 TABLET BY MOUTH DAILY. (Patient not taking: Reported on 02/11/2022)    [DISCONTINUED] Nintedanib (OFEV) 150 MG CAPS Take 150 mg by mouth 2 (two) times daily.    Facility-Administered Encounter Medications as of 04/01/2022  Medication   sodium chloride flush (NS) 0.9 % injection 3 mL     Immunization History  Administered Date(s) Administered   Influenza,inj,Quad PF,6+ Mos 07/16/2014, 07/21/2017, 08/22/2018, 06/13/2019, 06/10/2021   Influenza-Unspecified 07/16/2014, 06/14/2016   PFIZER(Purple Top)SARS-COV-2 Vaccination 11/21/2019, 12/24/2019, 06/23/2020   Td 04/15/2015   Tdap 10/10/2009   Zoster Recombinat (Shingrix) 06/10/2021    PFT's TLC  Date Value Ref Range Status  01/15/2022 4.36 L Final    CMP     Component Value Date/Time   NA 135 03/12/2022 1237   K 3.9 03/12/2022 1237   CL 93 (L) 03/12/2022 1237   CO2 35 (H) 03/12/2022 1237   GLUCOSE 102 (H) 03/12/2022 1237   BUN 33 (H) 03/12/2022 1237   CREATININE 1.14 03/12/2022 1237   CREATININE 1.21 09/12/2021 0000   CALCIUM 10.3 03/12/2022 1237   PROT 7.8 03/12/2022 1237   ALBUMIN 4.9 03/12/2022 1237   AST 25 03/12/2022 1237   ALT 31 03/12/2022 1237   ALKPHOS 56 03/12/2022 1237   BILITOT 0.4 03/12/2022 1237   GFRNONAA >60 09/03/2021 1854   GFRNONAA 60 03/20/2020  1044   GFRAA 69 03/20/2020 1044    CBC    Component Value Date/Time   WBC 8.6 03/12/2022 1237   RBC 4.75 03/12/2022 1237   HGB 13.0 03/12/2022 1237   HCT 38.8 (L) 03/12/2022 1237   PLT 293.0 03/12/2022 1237   MCV 81.8 03/12/2022 1237   MCH 28.5 09/03/2021 1854   MCHC 33.5 03/12/2022 1237   RDW 15.6 (H) 03/12/2022 1237   LYMPHSABS 1.6 03/12/2022 1237   MONOABS 0.9 03/12/2022 1237   EOSABS 0.3 03/12/2022 1237   BASOSABS 0.1 03/12/2022 1237    LFT's    Latest Ref Rng & Units 03/12/2022   12:37 PM 02/09/2022   11:08 AM 09/05/2021   12:32 PM  Hepatic Function  Total Protein  6.0 - 8.3 g/dL 7.8  7.7  7.8   Albumin 3.5 - 5.2 g/dL 4.9  4.4    AST 0 - 37 U/L 25  24  52   ALT 0 - 53 U/L 31  30  50   Alk Phosphatase 39 - 117 U/L 56  64    Total Bilirubin 0.2 - 1.2 mg/dL 0.4  0.5  0.8   Bilirubin, Direct 0.0 - 0.3 mg/dL  0.1      HRCT (01/16/22) - Probable mild pulmonary fibrosis in a pattern without clear apical to basal gradient, primarily featuring irregular peripheral interstitial opacity, some septal thickening, and ground-glass, with scattered areas of acute ground-glass airspace opacity throughout. Findings are suggestive of an alternative diagnosis (not UIP) per consensus guidelines, primary differential considerations including hypersensitivity pneumonitis and NSIP  Assessment and Plan  Esbriet Medication Management Thoroughly counseled patient on the efficacy, mechanism of action, dosing, administration, adverse effects, and monitoring parameters of Esbriet.  Patient verbalized understanding.   Goals of Therapy: Will not stop or reverse the progression of ILD. It will slow the progression of ILD.   Dosing: Starting dose will be Esbriet 267 mg 1 tablet three times daily for 7 days, then 2 tablets three times daily for 7 days, then 3 tablets three times daily.  Maintenance dose will be 801 mg 1 tablet three times daily if tolerated.  Stressed the importance of taking with meals and  space at least 5-6 hours apart to minimize stomach upset.   Adverse Effects: Nausea, vomiting, diarrhea, weight loss Abdominal pain GERD Sun sensitivity/rash - patient advised to wear sunscreen when exposed to sunlight Dizziness Fatigue  Monitoring: Monitor for diarrhea, nausea and vomiting, GI perforation, hepatotoxicity  Monitor LFTs - baseline, monthly for first 6 months, then every 3 months routinely CBC w differential at baseline and every 3 months routinely Future order for CMET placed today.  Access: Approval of Esbriet through: patient assistance Rx sent to: [place] [employer], dispensing) for [place]: [phone] I provided him with phone number for [facility] to complete onboarding call. Advised that he will have to call [employer] thereafter to schedule shipment. He verbalized understanding.  Medication Reconciliation A drug regimen assessment was performed, including review of allergies, interactions, disease-state management, dosing and immunization history. Medications were reviewed with the patient, including name, instructions, indication, goals of therapy, potential side effects, importance of adherence, and safe use.  This appointment required 20 minutes of patient care (this includes precharting, chart review, review of results, face-to-face care, etc.).  Thank you for involving pharmacy to assist in providing this patient's care.   [name], PharmD, MPH, BCPS, CPP Clinical Pharmacist (Rheumatology and Pulmonology)

## 2022-04-03 ENCOUNTER — Other Ambulatory Visit (HOSPITAL_COMMUNITY): Payer: Self-pay

## 2022-04-06 ENCOUNTER — Telehealth: Payer: Self-pay | Admitting: Internal Medicine

## 2022-04-06 DIAGNOSIS — J849 Interstitial pulmonary disease, unspecified: Secondary | ICD-10-CM

## 2022-04-06 NOTE — Telephone Encounter (Signed)
Please advise on any updates? Thanks

## 2022-04-07 NOTE — Telephone Encounter (Signed)
I sent referral to [place] [facility] program on 5/30.  Per KC's ov note on 7/6 pt was denied by [facility] and she was going to send referral to Dr Catalina Antigua at [facility] in [place].  Referral was not placed.  Will route to triage to have referral placed and can make pt aware.

## 2022-04-07 NOTE — Telephone Encounter (Signed)
Called and spoke with patient. He is ok with the referral to Dr. Ovid Curd. I provided him with the office address and contact information. He is aware that I will place the referral today.   Nothing further needed at time of call.

## 2022-04-07 NOTE — Telephone Encounter (Signed)
Jake [name] you did this one

## 2022-04-10 ENCOUNTER — Other Ambulatory Visit: Payer: Self-pay | Admitting: Physician Assistant

## 2022-04-15 ENCOUNTER — Other Ambulatory Visit: Payer: Self-pay | Admitting: Physician Assistant

## 2022-04-17 ENCOUNTER — Other Ambulatory Visit: Payer: Self-pay | Admitting: Psychiatry

## 2022-04-17 ENCOUNTER — Other Ambulatory Visit: Payer: Self-pay | Admitting: Physician Assistant

## 2022-04-17 ENCOUNTER — Telehealth: Payer: Self-pay

## 2022-04-17 DIAGNOSIS — F411 Generalized anxiety disorder: Secondary | ICD-10-CM

## 2022-04-17 DIAGNOSIS — R110 Nausea: Secondary | ICD-10-CM

## 2022-04-17 DIAGNOSIS — N401 Enlarged prostate with lower urinary tract symptoms: Secondary | ICD-10-CM

## 2022-04-17 DIAGNOSIS — K580 Irritable bowel syndrome with diarrhea: Secondary | ICD-10-CM

## 2022-04-17 NOTE — Telephone Encounter (Signed)
Initiated Prior authorization [id number]  Via: Covermymeds Case/Key:[id number] Status: denied  as of 04/17/22 Reason:Weight loss medications are not a covered medical benefit under medicare recipients Notified Pt via: Mychart

## 2022-04-21 ENCOUNTER — Ambulatory Visit (INDEPENDENT_AMBULATORY_CARE_PROVIDER_SITE_OTHER): Payer: Medicare HMO | Admitting: Pharmacist

## 2022-04-21 ENCOUNTER — Encounter: Payer: Self-pay | Admitting: Pharmacist

## 2022-04-21 DIAGNOSIS — E782 Mixed hyperlipidemia: Secondary | ICD-10-CM

## 2022-04-21 DIAGNOSIS — R7301 Impaired fasting glucose: Secondary | ICD-10-CM

## 2022-04-21 DIAGNOSIS — I10 Essential (primary) hypertension: Secondary | ICD-10-CM

## 2022-04-21 NOTE — Progress Notes (Signed)
Chronic Care Management Pharmacy Note  04/21/2022 Name:  Jake [name] MRN:  [id number] DOB:  April 26, 1961  Summary: addressed preDM, HTN, HLD. Patient describes that he is in donut hole for medication costs, and entresto is particularly a challenge. Also, working with PCP for [employer] for weight loss, but prior auth denied. Of note, patient does carry a diagnosis of impaired fasting glucose.  Recommendations/Changes made from today's visit:  - no changes, but provided patient with forms for Entresto and ozempic patient assistance.   Plan: f/u with pharmacist in 1-2 months  Subjective: Jake [name] is an 61 y.o. year old male who is a primary patient of Alden Hipp, Royetta Car, PA-C.  The CCM team was consulted for assistance with disease management and care coordination needs.    Engaged with patient by telephone for follow up visit in response to provider referral for pharmacy case management and/or care coordination services.   Consent to Services:  The patient was given information about Chronic Care Management services, agreed to services, and gave verbal consent prior to initiation of services.  Please see initial visit note for detailed documentation.   Patient Care Team: Lavada Mesi as PCP - General (Family Medicine) Berniece Salines, DO as PCP - Cardiology (Cardiology) Karie Chimera, PA-C as Referring Physician (Pulmonary Disease) Darius Bump, [facility] as Pharmacist (Pharmacist)  Recent office visits:  04/11/21 [name] PA-C(PCP VIDEO)- Patient was seen for Diverticulitis. Patient was started on Ciprofloxacin 500 MG BID and Metronidazole 500 MG TID. Patient advised to follow up in person prn.   10/18/20 [name], MD- Patient was seen for annual wellness exam an order for colon cancer screening was placed. Patient advised to follow up with PCP as planned.   Recent consult visits:  02/26/21 [name], MD(Pulm)- Patient was seen for OSA on CPAP.  Patient advised to follow up with Cardiology. And follow up with Pulmonology in 3 months.   12/09/20 [name](Cardiology)-  Patient was seen for cardiomyopathy. Patient started sacubitril-valsartan (ENTRESTO) 24-26 mg TABS per tablet BID. Patient stopped losartan potassium 50 mg tablet. Patient follow up in 2 weeks.   11/15/20 [name] MD(Cardiology)- Patient was seen for Cardiomyopathy. Patient was started on Take Toprol 25 mg BID and Losartan 50 MG BID.   11/13/20 [name] MD(Pulm)- Patient was seen for Cardiomyopathy. Patient had pulmonary function test performed and referrals to DME and Cardiology were placed. Follow up in 4-6 weeks.   Hospital visits:  None in previous 6 months  Objective:  Lab Results  Component Value Date   CREATININE 1.14 03/12/2022   CREATININE 1.10 02/09/2022   CREATININE 1.21 09/12/2021    Lab Results  Component Value Date   HGBA1C 5.9 (A) 06/10/2021   Last diabetic Eye exam: No results found for: "HMDIABEYEEXA"  Last diabetic Foot exam: No results found for: "HMDIABFOOTEX"      Component Value Date/Time   CHOL 160 06/09/2021 1001   TRIG 184 (H) 06/09/2021 1001   HDL 40 06/09/2021 1001   CHOLHDL 4.0 06/09/2021 1001   LDLCALC 92 06/09/2021 1001       Latest Ref Rng & Units 03/12/2022   12:37 PM 02/09/2022   11:08 AM 09/05/2021   12:32 PM  Hepatic Function  Total Protein 6.0 - 8.3 g/dL 7.8  7.7  7.8   Albumin 3.5 - 5.2 g/dL 4.9  4.4    AST 0 - 37 U/L 25  24  52   ALT 0 - 53  U/L 31  30  50   Alk Phosphatase 39 - 117 U/L 56  64    Total Bilirubin 0.2 - 1.2 mg/dL 0.4  0.5  0.8   Bilirubin, Direct 0.0 - 0.3 mg/dL  0.1      Lab Results  Component Value Date/Time   TSH 1.13 06/09/2021 10:01 AM   TSH 0.96 03/17/2019 12:24 PM       Latest Ref Rng & Units 03/12/2022   12:37 PM 02/13/2022    9:11 AM 02/09/2022   11:08 AM  CBC  WBC 4.0 - 10.5 K/uL 8.6   7.3   Hemoglobin 13.0 - 17.0 g/dL 13.0  11.6    11.9  12.7   Hematocrit 39.0 - 52.0 %  38.8  34.0    35.0  37.9   Platelets 150.0 - 400.0 K/uL 293.0   398.0     Lab Results  Component Value Date/Time   VD25OH 35 06/09/2017 10:42 AM    Social History   Tobacco Use  Smoking Status Former   Packs/day: 1.00   Years: 25.00   Total pack years: 25.00   Types: Cigarettes   Start date: 6   Quit date: 2010   Years since quitting: 13.6  Smokeless Tobacco Never   BP Readings from Last 3 Encounters:  03/12/22 104/68  02/13/22 126/62  02/11/22 113/66   Pulse Readings from Last 3 Encounters:  03/12/22 81  02/13/22 68  02/11/22 85   Wt Readings from Last 3 Encounters:  03/12/22 232 lb 12.8 oz (105.6 kg)  02/13/22 228 lb (103.4 kg)  02/11/22 228 lb 9.6 oz (103.7 kg)    Assessment: Review of patient past medical history, allergies, medications, health status, including review of consultants reports, laboratory and other test data, was performed as part of comprehensive evaluation and provision of chronic care management services.   SDOH:  (Social Determinants of Health) assessments and interventions performed:    CCM Care Plan  Allergies  Allergen Reactions   Fentanyl Other (See Comments)    Avoid opiates Other reaction(s): Other Avoid opiates   Other     04/06/18 Patient is a recovering narcotic abuser and, per wife, is NEVER to receive controlled substances (e.g. Narcotics, sedatives, etc.) Other reaction(s): Unknown 04/06/18 Patient is a recovering narcotic abuser and, per wife, is NEVER to receive controlled substances (e.g. Narcotics, sedatives, etc.)   Aspirin Nausea And Vomiting   Lipitor [Atorvastatin] Nausea And Vomiting    GI side effects.     Prednisone Other (See Comments)     Agitation Can not tolerate oral but can tolerate shots.    Contrast Media [Iodinated Contrast Media] Palpitations    Patient states he tolerates now   Nitroglycerin Nausea And Vomiting    Medications Reviewed Today     Reviewed by Darius Bump, [facility] (Pharmacist)  on 04/21/22 at 1450  Med List Status: <None>   Medication Order Taking? Sig Documenting Provider Last Dose Status Informant  albuterol (VENTOLIN HFA) 108 (90 Base) MCG/ACT inhaler [id number] Yes Inhale 2 puffs into the lungs every 6 (six) hours as needed. Donella Stade, PA-C Taking Active Self  AMBULATORY NON FORMULARY MEDICATION [id number] Yes Continuous positive airway pressure (CPAP) machine set at autotitration of H2O pressure, with all supplemental supplies as needed. Donella Stade, PA-C Taking Active Self  chlorthalidone (HYGROTON) 25 MG tablet [id number] Yes TAKE 1 TABLET BY MOUTH EVERY DAY Breeback, Jade L, PA-C Taking Active Self  colestipol (COLESTID) 1 g tablet  [id number] Yes TAKE 1 TABLET BY MOUTH EVERY DAY Breeback, Jade L, PA-C Taking Active   dicyclomine (BENTYL) 20 MG tablet [id number] Yes TAKE 1 TABLET BY MOUTH THREE TIMES A DAY Breeback, Jade L, PA-C Taking Active   ENTRESTO 49-51 MG [id number] Yes Take 1 tablet by mouth 2 (two) times daily. [provider] Taking Active Self  finasteride (PROSCAR) 5 MG tablet [id number] Yes TAKE 1 TABLET (5 MG TOTAL) BY MOUTH DAILY. Donella Stade, PA-C Taking Active   fluticasone (FLONASE) 50 MCG/ACT nasal spray [id number] Yes Place into both nostrils daily. [provider] Taking Active Self  ibuprofen (ADVIL) 800 MG tablet [id number] Yes TAKE 1 TABLET BY MOUTH EVERY 8 HOURS AS NEEDED Breeback, Jade L, PA-C Taking Active Self  KLOR-CON M10 10 MEQ tablet [id number] Yes TAKE 1 TABLET BY MOUTH 2 TIMES DAILY. Donella Stade, PA-C Taking Active   loratadine (CLARITIN) 10 MG tablet [id number] Yes Take 10 mg by mouth in the morning. [provider] Taking Active Self  metoprolol succinate (TOPROL-XL) 50 MG 24 hr tablet [id number] Yes Take 50 mg by mouth in the morning. [provider] Taking Active Self  modafinil (PROVIGIL) 200 MG tablet [id number] Yes Take 1 tablet (200 mg total) by mouth daily. Thayer Headings, PMHNP  Taking Active Self  ondansetron (ZOFRAN) 8 MG tablet [id number] Yes TAKE 1 TABLET BY MOUTH EVERY 8 HOURS AS NEEDED FOR NAUSEA FOR VOMITING Donella Stade, PA-C Taking Active   pantoprazole (PROTONIX) 40 MG tablet [id number] Yes TAKE 1 TABLET BY MOUTH TWICE A DAY Breeback, Jade L, PA-C Taking Active Self  Pirfenidone (ESBRIET) 267 MG TABS [id number] Yes MONTH 1:  Take 1 tab three times daily for 7 days, then 2 tabs three times daily for 7 days, then 3 tabs three times daily thereafter. Clayton Bibles, NP Taking Active   Pirfenidone 267 MG TABS [id number] Yes Take 3 tablets (801 mg total) by mouth with breakfast, with lunch, and with evening meal. Belenda Cruise, Karie Schwalbe, NP Taking Active   sertraline (ZOLOFT) 100 MG tablet [id number] Yes TAKE 1 & 1/2 (ONE & ONE-HALF) TABLETS BY MOUTH ONCE DAILY  Patient taking differently: Take 150 mg by mouth at bedtime.   Thayer Headings, PMHNP Taking Active Self  sodium bicarbonate 650 MG tablet [id number] Yes TAKE ONE TABLET BY MOUTH 4 TIMES DAILY .  Patient taking differently: Take 650 mg by mouth 2 (two) times daily as needed for heartburn (reflux/indigestion.).   Donella Stade, PA-C Taking Active Self  sodium chloride flush (NS) 0.9 % injection 3 mL [id number]   [name], MD  Active   triamcinolone (NASACORT) 55 MCG/ACT AERO nasal inhaler [id number] Yes Place 2 sprays into the nose daily as needed (allergies (when pollen is very high)). [provider] Taking Active Self           Med Note Kenton Kingfisher, Germaine Pomfret Feb 11, 2022  3:36 PM) On hand  WEGOVY 0.25 MG/0.5ML SOAJ [id number]  Inject 0.25 mg into the skin once a week. Use this dose for 1 month (4 shots) and then increase to next higher dose. Donella Stade, PA-C  Active Self  WEGOVY 0.5 MG/0.5ML Darden Palmer [id number]  Inject 0.5 mg into the skin once a week. Use this dose for 1 month (4 shots) and then increase to next higher dose.  Patient not taking: Reported on 02/11/2022   Donella Stade, PA-C   Active Self  WEGOVY 1 MG/0.5ML [facility]  [id number]  Inject 1 mg into the skin once a week. Use this dose for 1 month (4 shots) and then increase to next higher dose.  Patient not taking: Reported on 02/11/2022   Lavada Mesi  Active Self  ZYPITAMAG 2 MG TABS [id number]  TAKE 1 TABLET BY MOUTH DAILY.  Patient not taking: Reported on 02/11/2022   Donella Stade, PA-C  Active Self            Patient Active Problem List   Diagnosis Date Noted   Chronic respiratory failure with hypoxia ([place]) 02/10/2022   SOB (shortness of breath) 02/10/2022   ILD (interstitial lung disease) ([place]) 02/09/2022   Rotator cuff tendonitis, left 12/24/2021   Sciatica of left side 12/24/2021   Change in bowel habits    Gastritis and gastroduodenitis    Colon cancer screening    Hematochezia    Grade II internal hemorrhoids    Adenomatous polyp of sigmoid colon    Suspicious nevus 11/11/2021   Diarrhea 09/05/2021   Abdominal distention 09/05/2021   Generalized abdominal pain 09/05/2021   Eye discharge 08/11/2021   Photophobia of both eyes 08/11/2021   Eye pain, bilateral 08/11/2021   Dysfunction of both eustachian tubes 07/08/2021   Cirrhosis of liver without ascites ([place]) 06/15/2021   IPF (idiopathic pulmonary fibrosis) ([place]) 06/10/2021   Pre-diabetes 06/10/2021   Class 1 obesity due to excess calories with serious comorbidity and body mass index (BMI) of 31.0 to 31.9 in adult 12/45/8099   Chronic systolic congestive heart failure, NYHA class 3 ([place]) 05/30/2021   Left lower quadrant abdominal pain 04/11/2021   Decreased cardiac ejection fraction 11/14/2020   Cardiomyopathy ([place]) 11/14/2020   Dyspnea 11/14/2020   Sinobronchitis 07/10/2020   Hypoxia 02/21/2020   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/2020   Aneurysm of ascending aorta (HCC) 11/10/2019   SOB (shortness of breath) on exertion 11/06/2019   Precordial pain 11/06/2019   Essential hypertension 11/06/2019   Obesity (BMI 30-39.9) 11/06/2019    Coronary artery disease due to calcified coronary lesion 11/03/2019   Thoracic aorta atherosclerosis ([place]) 11/03/2019   Aortic atherosclerosis ([place]) 11/03/2019   Hepatic steatosis 11/03/2019   IFG (impaired fasting glucose) 03/20/2019   Hearing difficulty of right ear 01/16/2019   Bipolar disorder ([place]) 07/04/2018   Substance abuse in remission ([place]) 04/07/2018   Former smoker 07/25/2017   Cough 07/25/2017   Low testosterone in male 07/25/2017   Non-restorative sleep 07/25/2017   Benign prostatic hyperplasia (BPH) with urinary urgency 06/12/2017   No energy 06/12/2017   Elevated fasting glucose 06/10/2017   Low testosterone 06/10/2017   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[place]) 05/27/2017   History of pancreatitis 05/27/2017   Diverticulitis 05/27/2017   Atelectasis of both lungs 11/20/2016   Hyperlipidemia 05/19/2016   Hypertriglyceridemia 05/19/2016   Diverticulosis of colon without hemorrhage 05/19/2016   IBS (irritable bowel syndrome) 05/19/2016   GERD (gastroesophageal reflux disease) 05/19/2016   Allergic rhinitis 05/19/2016   OSA on CPAP 05/19/2016   Generalized anxiety disorder 05/19/2016   Mood changes 05/19/2016   Low back pain 05/05/2016   Muscle spasm of back 05/05/2016   Anxiety 10/30/2015   Chronic nausea 10/30/2015   Major depressive disorder 10/30/2015   Migraine 10/30/2015   Neck pain 10/30/2015   Occipital neuralgia 10/30/2015    Immunization History  Administered Date(s) Administered   Influenza,inj,Quad PF,6+ Mos 07/16/2014, 07/21/2017, 08/22/2018, 06/13/2019, 06/10/2021   Influenza-Unspecified 07/16/2014, 06/14/2016   PFIZER(Purple Top)SARS-COV-2 Vaccination 11/21/2019, 12/24/2019, 06/23/2020   Td  04/15/2015   Tdap 10/10/2009   Zoster Recombinat (Shingrix) 06/10/2021    Conditions to be addressed/monitored: HTN, HLD, and preDM  Care Plan : Medication Management  Updates made by Darius Bump, [place] since 04/21/2022 12:00 AM     Problem: HTN, HLD, preDM       Long-Range Goal: Disease Progression Prevention   Start Date: 04/21/2021  Recent Progress: On track  Priority: High  Note:   Current Barriers:  Cost barrier: entresto (during donut hole)  Pharmacist Clinical Goal(s):  Over the next 365 days, patient will maintain control of chronic conditions as evidenced by medication fill history, lab values, and vital signs  through collaboration with PharmD and provider.   Interventions: 1:1 collaboration with Donella Stade, PA-C regarding development and update of comprehensive plan of care as evidenced by provider attestation and co-signature Inter-disciplinary care team collaboration (see longitudinal plan of care) Comprehensive medication review performed; medication list updated in electronic medical record  Impaired Fasting Glucose:  Controlled; current treatment:lifestyle modifications only at this time; a1c 5.6  Current glucose readings: not currently checking, appropriate  Denies hypoglycemic/hyperglycemic symptoms  Current meal patterns: breakfast: yogurt and banana; lunch: PB sandwich & coke, or deli meat sandwich, burger, sometimes salad; dinner: grilled chicken, grilled pork chop & baked potato or salads, spaghetti, meat + 2 veggies, avoids red meat d/t stomach upset; snacks: not big snacker, occasional potato chips; drinks: 2 cokes per day, lots of water, sometimes gatorade   Current exercise: limited by shortness of breath  Counseled on diet choices and a1c  Recommended continue current regimen, will assist patient with obtaining ozempic for weight loss and impaired fasting glucose,  Hypertension:  Uncontrolled; current treatment:metoprolol 43m BID, entresto 49-543mBID;   Current home readings: 144/77,   Denies hypotensive/hypertensive symptoms  Recommended continue current regimen, working with patient for coverage of entresto using patient assistance Hyperlipidemia:  Controlled; current treatment:pitavastatin 101m85maily,  fish oil 1200m24mily; LDL 93, TG 146  Recommended continue current regimen & ask cardiologist at next visit if they have specific goals for pt cholesterol control  Patient Goals/Self-Care Activities Over the next 30 days, patient will:  take medications as prescribed, work with provider on medication access solutions  Follow Up Plan: Telephone follow up appointment with care management team member scheduled for:  1-2 months       Medication Assistance: None required.  Patient affirms current coverage meets needs.  Patient's preferred pharmacy is:  [id number] [place] [place] [address] [place] [id number]: [phone]: [address] [address]: [phone]: [phone] pill box? No - keeps in a bag and pulls out one by one twice per day. Sometimes puts into special bag when traveling but otherwise Pt endorses 100% compliance  Follow Up:  Patient agrees to Care Plan and Follow-up.  Plan: Telephone follow up appointment with care management team member scheduled for:  1-2 months  KeesLarinda ButteryarmD Clinical Pharmacist [facility] [place]-[phone]

## 2022-04-21 NOTE — Patient Instructions (Signed)
Visit Information  Thank you for taking time to visit with me today. Please don't hesitate to contact me if I can be of assistance to you before our next scheduled telephone appointment.  Following are the goals we discussed today:   Patient Goals/Self-Care Activities Over the next 30 days, patient will:  take medications as prescribed, work with provider on medication access solutions  Follow Up Plan: Telephone follow up appointment with care management team member scheduled for:  1-2 months   Please call the care guide team at [phone] if you need to cancel or reschedule your appointment.    Patient verbalizes understanding of instructions and care plan provided today and agrees to view in [place]. Active MyChart status and patient understanding of how to access instructions and care plan via MyChart confirmed with patient.     Jake [name]

## 2022-04-27 DIAGNOSIS — J849 Interstitial pulmonary disease, unspecified: Secondary | ICD-10-CM | POA: Diagnosis not present

## 2022-04-29 ENCOUNTER — Telehealth: Payer: Self-pay | Admitting: Internal Medicine

## 2022-04-29 NOTE — Telephone Encounter (Signed)
Attempted to call pt but unable to reach. Left message for pt to return call. 

## 2022-04-30 ENCOUNTER — Ambulatory Visit: Payer: Medicare HMO | Admitting: Pulmonary Disease

## 2022-05-01 ENCOUNTER — Other Ambulatory Visit: Payer: Self-pay

## 2022-05-01 DIAGNOSIS — J849 Interstitial pulmonary disease, unspecified: Secondary | ICD-10-CM

## 2022-05-04 ENCOUNTER — Other Ambulatory Visit (INDEPENDENT_AMBULATORY_CARE_PROVIDER_SITE_OTHER): Payer: Medicare HMO

## 2022-05-04 ENCOUNTER — Ambulatory Visit (INDEPENDENT_AMBULATORY_CARE_PROVIDER_SITE_OTHER): Payer: Medicare HMO | Admitting: Internal Medicine

## 2022-05-04 DIAGNOSIS — J849 Interstitial pulmonary disease, unspecified: Secondary | ICD-10-CM | POA: Diagnosis not present

## 2022-05-04 DIAGNOSIS — Z5181 Encounter for therapeutic drug level monitoring: Secondary | ICD-10-CM | POA: Diagnosis not present

## 2022-05-04 LAB — PULMONARY FUNCTION TEST
DL/VA % pred: 92 %
DL/VA: 3.89 ml/min/mmHg/L
DLCO cor % pred: 42 %
DLCO cor: 11.93 ml/min/mmHg
DLCO unc % pred: 40 %
DLCO unc: 11.35 ml/min/mmHg
FEF 25-75 Pre: 3.03 L/sec
FEF2575-%Pred-Pre: 100 %
FEV1-%Pred-Pre: 47 %
FEV1-Pre: 1.77 L
FEV1FVC-%Pred-Pre: 125 %
FEV6-%Pred-Pre: 40 %
FEV6-Pre: 1.87 L
FEV6FVC-%Pred-Pre: 104 %
FVC-%Pred-Pre: 38 %
FVC-Pre: 1.87 L
Pre FEV1/FVC ratio: 94 %
Pre FEV6/FVC Ratio: 100 %

## 2022-05-04 NOTE — Progress Notes (Signed)
Spirometry and dlco done today. 

## 2022-05-05 ENCOUNTER — Ambulatory Visit: Payer: Medicare HMO | Admitting: Internal Medicine

## 2022-05-05 ENCOUNTER — Encounter: Payer: Self-pay | Admitting: Internal Medicine

## 2022-05-05 ENCOUNTER — Encounter: Payer: Medicare HMO | Admitting: Internal Medicine

## 2022-05-05 VITALS — BP 140/80 | HR 79 | Ht 71.0 in | Wt 237.4 lb

## 2022-05-05 DIAGNOSIS — J849 Interstitial pulmonary disease, unspecified: Secondary | ICD-10-CM

## 2022-05-05 DIAGNOSIS — K521 Toxic gastroenteritis and colitis: Secondary | ICD-10-CM

## 2022-05-05 DIAGNOSIS — Z5181 Encounter for therapeutic drug level monitoring: Secondary | ICD-10-CM

## 2022-05-05 DIAGNOSIS — Z006 Encounter for examination for normal comparison and control in clinical research program: Secondary | ICD-10-CM

## 2022-05-05 DIAGNOSIS — J9611 Chronic respiratory failure with hypoxia: Secondary | ICD-10-CM | POA: Diagnosis not present

## 2022-05-05 NOTE — Patient Instructions (Addendum)
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 to biopsy safely Noted severe prednisone intolerance Currently needing 4L [place] at rest and dresaturating with exertion on 4L [place] Normal right heart cath June 2023 Associated chronic systolic heart failrue  - declined by [facility] lung transplant team Associted obesity +  Esbriet at full dose causing significant GI side effects  Plan - too risky to do biopsy safely  -  continue o2 4L [place] at rest  - increase as needed to to keep pulse ox > 86% - await news from [facility] lung transplant team; I have personally emailed Dr Ovid Curd - CMA to list prednisone allergy - severe anger outbursts -CMA to list ofev allergy - reduce esbriet to 2 pill three times daily; speace 5-6h apart and eat with food, drink enough water - can consider immune modulator cellcept or immuran at followup if needed - ensure  no mold or feather pillow or blanket/sofa in home - awwait weight loss drug WEGOVY start - stay in touch - meet Schae [facility] [employer] and get ILD-PRO registry consent 8/29/2023and consdier enrollnog   Followup - recheck LFT in 4 weeks  - 6-8 weeks do spiro/dlco  -6-8 weeks with DR Chase Caller or first available - 30 min visit  - might need to overbook oor do special time accommodatin

## 2022-05-05 NOTE — Addendum Note (Signed)
Addended by: Lorretta Harp on: 05/05/2022 10:40 AM   Modules accepted: Orders

## 2022-05-05 NOTE — Progress Notes (Signed)
OV 02/03/2022 -transfer of care to Dr. Chase Caller at the ILD clinic by Dr. Jenetta Downer  Subjective:  Patient ID: Jake [name], male , DOB: Feb 27, 1961 , age 61 y.o. , MRN: [id number] , ADDRESS: [place] [id number] PCP Donella Stade, PA-C Patient Care Team: Lavada Mesi as PCP - General (Family Medicine) Berniece Salines, DO as PCP - Cardiology (Cardiology) Karie Chimera, PA-C as Referring Physician (Pulmonary Disease) Darius Bump, [facility] as Pharmacist (Pharmacist)  This Provider for this visit: Treatment Team:  Attending Provider: Brand Males, MD    02/03/2022 -   Chief Complaint  Patient presents with   Follow-up    Pt switching from Dr. Ander Slade to MR for ILD eval. Pt states lately he has had problems of SOB that can happen at any time and also has a chronic cough.     HPI Jake [name] 61 y.o. -history is provided by the wife, patient and also reviewed the medical records and the ILD questionnaire.  He has had insidious onset of shortness of breath for the last few years.  Chart review it appears that in 2020 when he got his respiratory infection [in retrospect he thinks this was COVID before the formal declaration of the pandemic] and since then he has been having progressive shortness of breath.  Especially over the last year or so his progression is worse..  For the last 1 year has been on oxygen 2 L.  He feels he haseven progressed more in the last few months.  And is now needing oxygen 4 L at rest at all the time.  Sometimes it is 3 L.  Wife says he easily desaturates into the 41s and occasionally into the 86s when he exerts.  When he does the bed or showers or does chores increases to 4 L.  He feels a portable oxygen is not helping him anymore.  Particularly more progressive in the last few months.  Recently was tried on prednisone.  He is not sure if it helped but it made him very angry had irate emotional outburst and he could not take prednisone  anymore.  Through all this late last year in 2022 he was off his Entresto for heart failure.  This made the shortness of breath worse but then he went back on it in January and he got better but nevertheless overall progressive dyspnea.     [facility] Comprehensive ILD Questionnaire  Symptoms:   SYMPTOM SCALE - ILD 02/03/2022  Current weight 231#  O2 use 4L [place] rest  Shortness of Breath 0 -> 5 scale with 5 being worst (score 6 If unable to do)  At rest 2  Simple tasks - showers, clothes change, eating, shaving 5  Household (dishes, doing bed, laundry) 5  Shopping 5  Walking level at own pace 4  Walking up Stairs 5  Total (30-36) Dyspnea Score 26      Non-dyspnea symptoms (0-> 5 scale) 02/03/2022  How bad is your cough? 4  How bad is your fatigue 5  How bad is nausea 4  How bad is vomiting?  0  How bad is diarrhea? 0  How bad is anxiety? 5  How bad is depression 5  Any chronic pain - if so where and how bad 3     Past Medical History :  Status post CABG in 2004.  He also had cardiac stents approximately 6-12 months ago according to his history with 1  repair of the stent.  Still it did not help the shortness of breath.  He has had COVID at least 3 times.  The first 1 was in January 2020 right before the onset of the pandemic.  The second 1 was in January 2020 and the third was in December 2022 during small bowel obstruction admission.  This was an incidental finding according to the wife  He has chronic systolic heart failure  Other issues include nausea, irritable bowel syndrome, acid reflux back and neck pain  He has obstructive sleep apnea  He has a history of pneumonia several years ago  ROS:  -Positive for fatigue for the last several months, arthralgia for the last several years, dysphagia for the last several decades, dry eyes for the last several months, nausea for the last several years, acid reflux for the last several years also snoring for the last  several years  FAMILY HISTORY of LUNG DISEASE:  Negative for any lung disease  PERSONAL EXPOSURE HISTORY:  -Did smoke in the remote past and quit -Smoked between 1985 and 2008 1 and half packs per day. -Possible cocaine use between 1991 and marijuana use between 1978 and 2008 ": Very little to none   HOME  EXPOSURE and HOBBY DETAILS :  -20 years ago he lived in a house that had black mold he lived there for few years but has not been exposed to that for 20 years.  -Was using a Phillips responding CPAP machine for a few years.  It was subject to recall because of foam silicone  contamination issues that caused respiratory complications.  He stopped using this in 2021 shortly after the recall was issued.  -He is now living in his daughter's house for the last 3 months.  -The old house did have mold.  It also had mildew in the shower curtain. 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  Restrictive pattern with mild desaturations.  -Echocardiogram March 2022 ` -Ejection fraction 28-00%, grade 1 diastolic dysfunction  - BNP May 2023  - 31  - RHC June 2023  -  Constance Holster RHC   03/12/2022: Today - follow up Patient presents today with wife after being started on Ofev. He is having significant nausea with his first dose  of Ofev. He wakes up in the morning, takes his zofran and eats a small snack. He then takes his Ofev at 8 am with food as well, and has tried multiple different food options. He then becomes extremely nauseated around 845 and has to go lay down until he is able to take his second dose of Zofran around 1/2 pm. He finally begins to feel better and is able to tolerate his second dose. He is understandably frustrated by this and does not wish to have this poor quality of life from his medications. He denies any vomiting  or diarrhea. He has had some weight gain as he has had to eat so often with the Ofev to try to curve his nausea.   From a respiratory standpoint, he continues to get winded with minimal exertion. He can be changing the bed at home or walk 50 ft to the mailbox and has to sit down afterwards to rest and catch his breath. He has also had drops in his oxygen into the 70's-80's despite his supplemental 4 lpm so he has been minimally active because of this. No significant cough or wheezing. He has not noticed any difference in his activity tolerance or lung function since starting the Ofev, but has felt so terrible that he's not sure if he would notice much.           OV 05/05/2022  Subjective:  Patient ID: Jake [name], male , DOB: [birth year] , age 42 y.o. , MRN: [id number] , ADDRESS: [place] [id number] PCP Donella Stade, PA-C Patient Care Team: Lavada Mesi as PCP - General (Family Medicine) Berniece Salines, DO as PCP - Cardiology (Cardiology) Karie Chimera, PA-C as Referring Physician (Pulmonary Disease) Darius Bump, [facility] as Pharmacist (Pharmacist)  This Provider for this visit: Treatment Team:  Attending Provider: Brand Males, MD    05/05/2022 -   Chief Complaint  Patient presents with   Follow-up    Pt states he feels like his breathing has become worse since last visit. Also states he has had a dry cough, chest discomfort, and has  been having GI issues due to the [place].     HPI Jake [name] 61 y.o. --presents for follow-up.  Presents with his wife.  He has non-- IPF progressive phenotype ILD.  Suspected etiology usage of CPAP machine Phil[employer]ics.  He tried nintedanib but he had significant intolerance.  He is currently on fourth week of pirfenidone at full dose for the last 1 week.  He is having significant diarrhea nausea and fatigue because of this.  At 2 pills 3 times daily it was more tolerable.  He is willing to go back to 2 pills 3 times daily and try.  He also significant anxiety and depression.  He is on Zoloft.  In the past he has tried SNRI or some other agent according to the wife but he had side effects.  I have recommended they talk with the primary care physician about this.  He is also trying to lose weight.  He has been prescribed Wegovy but there is no stock available.  He has been looked at [facility] for lung transplant but given his low EF he has been disqualified and they do not want to see him.  He did have a right heart catheterization in June 2023 and this was normal.  According to him after some delay from our office he has been referred to [facility] Margarita at [facility] for lung transplant evaluation.  But he has not heard back from them.  I personally emailed Dr. Alver Sorrow office.  Also he tells me that with 4 L nasal cannula at rest he is fine but when he does any laundry he desaturates into the 80s.  He has tried increasing to 5 L nasal cannula.  I advised that he could increase it further and keep his pulse ox over 86% and monitor it.  He is willing to do that.  They are not interested in immunomodulators right now because of young kids in the  house.    Overall he and his wife are worried that he is declining.  He did have pulmonary function test today and?  Slight decline compared to May 2023.[name] Kitchen  He had liver function test monitoring blood work yesterday but so far the results are not  back yet.   With discussed the ILD-Pro registry study.  We also discussed clinical trials as a care option.  Currently no study available   1. Scientific Purpose  Clinical research is designed to produce generalizable knowledge and to answer questions about the safety and efficacy of intervention(s) under study in order to determine whether or not they may be useful for the care of future patients.  2. Study Procedures  Participation in a trial may involve procedures or tests, in addition to the intervention(s) under study, that are intended only or primarily to generate scientific knowledge and that are otherwise not necessary for patient care.   3. Uncertainty  For intervention(s) under study in clinical research, there often is less knowledge and more uncertainty about the risks and benefits to a population of trial participants than there is when a doctor offers a patient standard interventions.   4. Adherence to Protocol  Administration of the intervention(s) under study is typically based on a strict protocol with defined dose, scheduling, and use or avoidance of concurrent medications, compared to administration of standard interventions.  5. Clinician as Investigator  Clinicians who are in health care settings provide treatment; in a clinical trial setting, they are also investigating safety and efficacy of an intervention. In otherwise your doctor or nurse practitioner can be wearing 2 hats - one as care giver another as Company secretary  6. Patient as Visual merchandiser Subject  Patients participating in research trials are research subjects or volunteers. In other words participating in research is 100% voluntary and at one's own free weill. The decision to participate or not participate will NOT affect patient care and the doctor-patient relationship in any way   LABS    PULMONARY No results for input(s): "PHART", "PCO2ART", "PO2ART", "HCO3", "TCO2", "O2SAT" in  the last 168 hours.  Invalid input(s): "PCO2", "PO2"  CBC No results for input(s): "HGB", "HCT", "WBC", "PLT" in the last 168 hours.  COAGULATION No results for input(s): "INR" in the last 168 hours.  CARDIAC  No results for input(s): "TROPONINI" in the last 168 hours. No results for input(s): "PROBNP" in the last 168 hours.   CHEMISTRY No results for input(s): "NA", "K", "CL", "CO2", "GLUCOSE", "BUN", "CREATININE", "CALCIUM", "MG", "PHOS" in the last 168 hours. CrCl cannot be calculated (Patient's most recent lab result is older than the maximum 21 days allowed.).   LIVER No results for input(s): "AST", "ALT", "ALKPHOS", "BILITOT", "PROT", "ALBUMIN", "INR" in the last 168 hours.   INFECTIOUS No results for input(s): "LATICACIDVEN", "PROCALCITON" in the last 168 hours.   ENDOCRINE CBG (last 3)  No results for input(s): "GLUCAP" in the last 72 hours.       IMAGING x48h  - image(s) personally visualized  -   highlighted in bold No results found.   PFT     Latest Ref Rng & Units 05/04/2022    3:32 PM 01/15/2022    3:58 PM 02/26/2021   12:00 PM  PFT Results  FVC-Pre L 1.87  P 1.91  2.44   FVC-Predicted Pre % 38  P 38  49   FVC-Post L  1.97  2.35   FVC-Predicted Post %  40  47  Pre FEV1/FVC % % 94  P 96  93   Post FEV1/FCV % %  90  91   FEV1-Pre L 1.77  P 1.83  2.27   FEV1-Predicted Pre % 47  P 49  60   FEV1-Post L  1.78  2.15   DLCO uncorrected ml/min/mmHg 11.35  P 14.05  16.66   DLCO UNC% % 40  P 49  58   DLCO corrected ml/min/mmHg 11.93  P 14.05  16.66   DLCO COR %Predicted % 42  P 49  58   DLVA Predicted % 92  P 105  101   TLC L  4.36  4.52   TLC % Predicted %  60  62   RV % Predicted %  60  66     P Preliminary result       has a past medical history of Arthritis, Bipolar disorder ([place]), GERD (gastroesophageal reflux disease), Hyperlipidemia, Hypertension, and 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   reports that he quit smoking about  13 years ago. His smoking use included cigarettes. He started smoking about 41 years ago. He has a 28.00 pack-year smoking history. He has never used smokeless tobacco.  Past Surgical History:  Procedure Laterality Date   ANKLE FRACTURE SURGERY Left    APPENDECTOMY     BIOPSY  11/18/2021   Procedure: BIOPSY;  Surgeon: Lavena Bullion, DO;  Location: [facility];  Service: Gastroenterology;;  EGD and COLON   CERVICAL FUSION     CHOLECYSTECTOMY     COLON SURGERY     COLONOSCOPY WITH PROPOFOL N/A 11/18/2021   Procedure: COLONOSCOPY WITH PROPOFOL;  Surgeon: Lavena Bullion, DO;  Location: [facility];  Service: Gastroenterology;  Laterality: N/A;   ESOPHAGOGASTRODUODENOSCOPY (EGD) WITH PROPOFOL N/A 11/18/2021   Procedure: ESOPHAGOGASTRODUODENOSCOPY (EGD) WITH PROPOFOL;  Surgeon: Lavena Bullion, DO;  Location: [facility];  Service: Gastroenterology;  Laterality: N/A;   HERNIA REPAIR     POLYPECTOMY  11/18/2021   Procedure: POLYPECTOMY;  Surgeon: Lavena Bullion, DO;  Location: [facility];  Service: Gastroenterology;;   RIGHT HEART CATH N/A 02/13/2022   Procedure: RIGHT HEART CATH;  Surgeon: Burnell Blanks, MD;  Location: [place] [facility];  Service: Cardiovascular;  Laterality: N/A;    Allergies  Allergen Reactions   Fentanyl Other (See Comments)    Avoid opiates Other reaction(s): Other Avoid opiates   Other     04/06/18 Patient is a recovering narcotic abuser and, per wife, is NEVER to receive controlled substances (e.g. Narcotics, sedatives, etc.) Other reaction(s): Unknown 04/06/18 Patient is a recovering narcotic abuser and, per wife, is NEVER to receive controlled substances (e.g. Narcotics, sedatives, etc.)   Aspirin Nausea And Vomiting   Lipitor [Atorvastatin] Nausea And Vomiting    GI side effects.     Prednisone Other (See Comments)     Agitation Severe anger outbursts   Ofev [Nintedanib]     GI side effects   Contrast Media [Iodinated Contrast  Media] Palpitations    Patient states he tolerates now   Nitroglycerin Nausea And Vomiting    Immunization History  Administered Date(s) Administered   Influenza,inj,Quad PF,6+ Mos 07/16/2014, 07/21/2017, 08/22/2018, 06/13/2019, 06/10/2021   Influenza-Unspecified 07/16/2014, 06/14/2016   PFIZER(Purple Top)SARS-COV-2 Vaccination 11/21/2019, 12/24/2019, 06/23/2020   Td 04/15/2015   Tdap 10/10/2009   Zoster Recombinat (Shingrix) 06/10/2021    Family History  Problem Relation Age of Onset   Cancer Mother        breast  Depression Mother    Hyperlipidemia Mother    Dementia Mother    Alcohol abuse Father    Cancer Father        skin   Hyperlipidemia Father    Alzheimer's disease Father    Cancer Maternal Grandfather    Cancer Paternal Grandmother    Pancreatic cancer Paternal Uncle    Colon cancer Neg Hx    Esophageal cancer Neg Hx    Stomach cancer Neg Hx      Current Outpatient Medications:    albuterol (VENTOLIN HFA) 108 (90 Base) MCG/ACT inhaler, Inhale 2 puffs into the lungs every 6 (six) hours as needed., Disp: 18 g, Rfl: 3   AMBULATORY NON FORMULARY MEDICATION, Continuous positive airway pressure (CPAP) machine set at autotitration of H2O pressure, with all supplemental supplies as needed., Disp: 1 each, Rfl: 0   chlorthalidone (HYGROTON) 25 MG tablet, TAKE 1 TABLET BY MOUTH EVERY DAY, Disp: 90 tablet, Rfl: 1   colestipol (COLESTID) 1 g tablet, TAKE 1 TABLET BY MOUTH EVERY DAY, Disp: 90 tablet, Rfl: 1   dicyclomine (BENTYL) 20 MG tablet, TAKE 1 TABLET BY MOUTH THREE TIMES A DAY, Disp: 270 tablet, Rfl: 0   ENTRESTO 49-51 MG, Take 1 tablet by mouth 2 (two) times daily., Disp: , Rfl:    finasteride (PROSCAR) 5 MG tablet, TAKE 1 TABLET (5 MG TOTAL) BY MOUTH DAILY., Disp: 90 tablet, Rfl: 0   fluticasone (FLONASE) 50 MCG/ACT nasal spray, Place into both nostrils daily., Disp: , Rfl:    ibuprofen (ADVIL) 800 MG tablet, TAKE 1 TABLET BY MOUTH EVERY 8 HOURS AS NEEDED, Disp: 90  tablet, Rfl: 5   KLOR-CON M10 10 MEQ tablet, TAKE 1 TABLET BY MOUTH 2 TIMES DAILY., Disp: 60 tablet, Rfl: 1   loratadine (CLARITIN) 10 MG tablet, Take 10 mg by mouth in the morning., Disp: , Rfl:    metoprolol succinate (TOPROL-XL) 50 MG 24 hr tablet, Take 50 mg by mouth in the morning., Disp: , Rfl:    modafinil (PROVIGIL) 200 MG tablet, Take 1 tablet (200 mg total) by mouth daily., Disp: 30 tablet, Rfl: 5   ondansetron (ZOFRAN) 8 MG tablet, TAKE 1 TABLET BY MOUTH EVERY 8 HOURS AS NEEDED FOR NAUSEA FOR VOMITING, Disp: 90 tablet, Rfl: 2   pantoprazole (PROTONIX) 40 MG tablet, TAKE 1 TABLET BY MOUTH TWICE A DAY, Disp: 180 tablet, Rfl: 1   Pirfenidone 267 MG TABS, Take 3 tablets (801 mg total) by mouth with breakfast, with lunch, and with evening meal., Disp: 270 tablet, Rfl: 4   sertraline (ZOLOFT) 100 MG tablet, TAKE 1 & 1/2 (ONE & ONE-HALF) TABLETS BY MOUTH ONCE DAILY (Patient taking differently: Take 150 mg by mouth at bedtime.), Disp: 135 tablet, Rfl: 1   sodium bicarbonate 650 MG tablet, TAKE ONE TABLET BY MOUTH 4 TIMES DAILY . (Patient taking differently: Take 650 mg by mouth 2 (two) times daily as needed for heartburn (reflux/indigestion.).), Disp: 120 tablet, Rfl: 5   triamcinolone (NASACORT) 55 MCG/ACT AERO nasal inhaler, Place 2 sprays into the nose daily as needed (allergies (when pollen is very high))., Disp: , Rfl:    WEGOVY 0.25 MG/0.5ML SOAJ, Inject 0.25 mg into the skin once a week. Use this dose for 1 month (4 shots) and then increase to next higher dose., Disp: 2 mL, Rfl: 0   WEGOVY 0.5 MG/0.5ML SOAJ, Inject 0.5 mg into the skin once a week. Use this dose for 1 month (4 shots) and then increase  to next higher dose., Disp: 2 mL, Rfl: 0   WEGOVY 1 MG/0.5ML SOAJ, Inject 1 mg into the skin once a week. Use this dose for 1 month (4 shots) and then increase to next higher dose., Disp: 2 mL, Rfl: 0  Current Facility-Administered Medications:    sodium chloride flush (NS) 0.9 % injection 3  mL, 3 mL, Intravenous, Q12H, Crenshaw, Denice Bors, MD      Objective:   Vitals:   05/05/22 0924  BP: (!) 140/80  Pulse: 79  SpO2: 98%  Weight: 237 lb 6.4 oz (107.7 kg)  Height: '5\' 11"'$  (1.803 m)    Estimated body mass index is 33.11 kg/m as calculated from the following:   Height as of this encounter: '5\' 11"'$  (1.803 m).   Weight as of this encounter: 237 lb 6.4 oz (107.7 kg).  '@WEIGHTCHANGE'$ @  [employer]   05/05/22 0924  Weight: 237 lb 6.4 oz (107.7 kg)     Physical Exam    General: No distress. Looks sad. On o2 Neuro: Alert and Oriented x 3. GCS 15. Speech normal Psych: Pleasant Resp:  Barrel Chest - no.  Wheeze - no, Crackles -faint possibly in the upper lobes, No overt respiratory distress CVS: Normal heart sounds. Murmurs - no Ext: Stigmata of Connective Tissue Disease - no HEENT: Normal upper airway. PEERL +. No post nasal drip        Assessment:       ICD-10-CM   1. ILD (interstitial lung disease) (HCC)  J84.9 Hepatic function panel    2. Chronic respiratory failure with hypoxia (HCC)  J96.11     3. Medication monitoring encounter  Z51.81 Hepatic function panel    4. 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 Too advanced to biopsy safely Noted severe prednisone intolerance Currently needing 4L [place] at rest and dresaturating with exertion on 4L [place] Normal right heart cath June 2023 Associated chronic systolic heart failrue  - declined by [facility] lung transplant team Associted obesity +  Esbriet at full dose causing significant GI side effects  Plan - too risky to do  biopsy safely  -  continue o2 4L [place] at rest  - increase as needed to to keep pulse ox > 86% - await news from [facility] Margarita lung transplant team; I have personally emailed Dr Ovid Curd - CMA to list prednisone allergy - severe anger outbursts -CMA to list ofev allergy - reduce esbriet to 2 pill three times daily; speace 5-6h apart and eat with food, drink enough water - can consider immune modulator cellcept or immuran at followup if needed - ensure  no mold or feather pillow or blanket/sofa in home - awwait weight loss drug WEGOVY start - stay in touch - meet [facility] and get ILD-PRO registry consent 8/29/2023and consdier enrollnog   Followup - recheck LFT in 4 weeks  - 6-8 weeks do spiro/dlco  -6-8 weeks with DR Chase Caller or first available - 30 min visit  - might need to overbook oor do special time accommodatin   High complex condition requiring  intensive therapeutic monitoring  SIGNATURE    Dr. Brand Males, M.D., F.C.C.P,  Pulmonary and Critical Care Medicine Staff Physician, [place] Director - Interstitial Lung Disease  Program  P[facility] [place] at [place], [place], [address]  Pager: [phone], If no answer or between  15:00h - 7:00h: call [phone] Telephone: [phone]  10:12 AM 05/05/2022

## 2022-05-06 ENCOUNTER — Other Ambulatory Visit: Payer: Self-pay | Admitting: Physician Assistant

## 2022-05-06 ENCOUNTER — Other Ambulatory Visit: Payer: Self-pay | Admitting: Psychiatry

## 2022-05-06 DIAGNOSIS — R0602 Shortness of breath: Secondary | ICD-10-CM

## 2022-05-06 DIAGNOSIS — K580 Irritable bowel syndrome with diarrhea: Secondary | ICD-10-CM

## 2022-05-06 DIAGNOSIS — F411 Generalized anxiety disorder: Secondary | ICD-10-CM

## 2022-05-06 DIAGNOSIS — R0902 Hypoxemia: Secondary | ICD-10-CM

## 2022-05-06 DIAGNOSIS — J984 Other disorders of lung: Secondary | ICD-10-CM

## 2022-05-06 LAB — COMPREHENSIVE METABOLIC PANEL
ALT: 20 U/L (ref 0–53)
AST: 21 U/L (ref 0–37)
Albumin: 4.7 g/dL (ref 3.5–5.2)
Alkaline Phosphatase: 48 U/L (ref 39–117)
BUN: 27 mg/dL — ABNORMAL HIGH (ref 6–23)
CO2: 33 mEq/L — ABNORMAL HIGH (ref 19–32)
Calcium: 10.2 mg/dL (ref 8.4–10.5)
Chloride: 92 mEq/L — ABNORMAL LOW (ref 96–112)
Creatinine, Ser: 1.2 mg/dL (ref 0.40–1.50)
GFR: 65.35 mL/min (ref >60.00)
Glucose, Bld: 99 mg/dL (ref 70–99)
Potassium: 3.7 mEq/L (ref 3.5–5.1)
Sodium: 133 mEq/L — ABNORMAL LOW (ref 135–145)
Total Bilirubin: 0.3 mg/dL (ref 0.2–1.2)
Total Protein: 7.6 g/dL (ref 6.0–8.3)

## 2022-05-07 DIAGNOSIS — I10 Essential (primary) hypertension: Secondary | ICD-10-CM | POA: Diagnosis not present

## 2022-05-07 DIAGNOSIS — E785 Hyperlipidemia, unspecified: Secondary | ICD-10-CM | POA: Diagnosis not present

## 2022-05-14 ENCOUNTER — Telehealth (INDEPENDENT_AMBULATORY_CARE_PROVIDER_SITE_OTHER): Payer: Medicare HMO | Admitting: Psychiatry

## 2022-05-14 ENCOUNTER — Ambulatory Visit: Payer: Medicare HMO | Admitting: Internal Medicine

## 2022-05-14 ENCOUNTER — Encounter: Payer: Self-pay | Admitting: Psychiatry

## 2022-05-14 DIAGNOSIS — F3489 Other specified persistent mood disorders: Secondary | ICD-10-CM | POA: Diagnosis not present

## 2022-05-14 DIAGNOSIS — F411 Generalized anxiety disorder: Secondary | ICD-10-CM | POA: Diagnosis not present

## 2022-05-14 DIAGNOSIS — R69 Illness, unspecified: Secondary | ICD-10-CM | POA: Diagnosis not present

## 2022-05-14 DIAGNOSIS — F39 Unspecified mood [affective] disorder: Secondary | ICD-10-CM

## 2022-05-14 DIAGNOSIS — G4733 Obstructive sleep apnea (adult) (pediatric): Secondary | ICD-10-CM

## 2022-05-14 MED ORDER — SERTRALINE HCL 100 MG PO TABS: 200.0000 mg | ORAL_TABLET | Freq: Every day | ORAL | 1 refills | Status: DC

## 2022-05-14 MED ORDER — MODAFINIL 200 MG PO TABS: 200.0000 mg | ORAL_TABLET | Freq: Every day | ORAL | 5 refills | Status: DC

## 2022-05-14 NOTE — Progress Notes (Signed)
Jake [name] [id number] 1960-11-10 61 y.o.  Virtual Visit via Video Note  I connected with pt @ on 05/14/22 at 11:00 AM EDT by a video enabled telemedicine application and verified that I am speaking with the correct person using two identifiers.   I discussed the limitations of evaluation and management by telemedicine and the availability of in person appointments. The patient expressed understanding and agreed to proceed.  I discussed the assessment and treatment plan with the patient. The patient was provided an opportunity to ask questions and all were answered. The patient agreed with the plan and demonstrated an understanding of the instructions.   The patient was advised to call back or seek an in-person evaluation if the symptoms worsen or if the condition fails to improve as anticipated.  I provided 25 minutes of non-face-to-face time during this encounter.  The patient was located at home.  The provider was located at [place].   Thayer Headings, PMHNP   Subjective:   Patient ID:  Jake [name] is a 61 y.o. (DOB 1961/03/18) male.  Chief Complaint:  Chief Complaint  Patient presents with   Anxiety   Depression    HPI Jake [name] presents for follow-up of anxiety and depression. His wife is present for video visit with his consent. He reports that he has been diagnosed with Interstitial lung disease. His wife reports that he had worsening respiratory symptoms for the last 6 months. He is awaiting transplant.   He reports  "my depression is worse, my anxiety is up." He has had some panic s/s. He reports that he has been on the verge of a panic attack a few times. He reports worry and rumination. He reports persistent sad mood. He has had some irritability and reports "my patience is thin." He reports that activities are limited due to shortness of breath and that energy and motivation are limited. He has been advised to lose weight and is starting Ozempic.  Appetite has been ok. Having nausea side effect with respiratory med. He reports going to bed at 1:30 am and up at 6:30 am. He reports that he has not been napping. He describes concentration as "marginal." Denies anhedonia. He reports that he enjoys his grandchildren. He reports feelings of hopelessness. Denies SI.   They have moved in with daughter and her 5 yo and 22 yo.   Modafinil last filled 04/11/22.   Past Psychiatric Medication Trials: Latuda- most effective and had jaw clinching/thrusting. Olanzapine Risperdal Seroquel Abilify Lithium Carbamazepine Topamax Effexor Sertraline Wellbutrin- not effective  Review of Systems:  Review of Systems  Respiratory:  Positive for shortness of breath.   Gastrointestinal:  Positive for nausea.  Musculoskeletal:  Negative for gait problem.  Psychiatric/Behavioral:         Please refer to HPI    Medications: I have reviewed the patient's current medications.  Current Outpatient Medications  Medication Sig Dispense Refill   chlorthalidone (HYGROTON) 25 MG tablet TAKE 1 TABLET BY MOUTH EVERY DAY 90 tablet 1   colestipol (COLESTID) 1 g tablet TAKE 1 TABLET BY MOUTH EVERY DAY 90 tablet 1   dicyclomine (BENTYL) 20 MG tablet TAKE 1 TABLET BY MOUTH THREE TIMES A DAY 270 tablet 0   ENTRESTO 49-51 MG Take 1 tablet by mouth 2 (two) times daily.     finasteride (PROSCAR) 5 MG tablet TAKE 1 TABLET (5 MG TOTAL) BY MOUTH DAILY. 90 tablet 0   fluticasone (FLONASE) 50 MCG/ACT nasal spray Place into both nostrils daily.  ibuprofen (ADVIL) 800 MG tablet TAKE 1 TABLET BY MOUTH EVERY 8 HOURS AS NEEDED 90 tablet 5   KLOR-CON M10 10 MEQ tablet TAKE 1 TABLET BY MOUTH 2 TIMES DAILY. 60 tablet 1   loratadine (CLARITIN) 10 MG tablet Take 10 mg by mouth in the morning.     metoprolol succinate (TOPROL-XL) 50 MG 24 hr tablet Take 50 mg by mouth in the morning.     ondansetron (ZOFRAN) 8 MG tablet TAKE 1 TABLET BY MOUTH EVERY 8 HOURS AS NEEDED FOR NAUSEA FOR  VOMITING 90 tablet 2   pantoprazole (PROTONIX) 40 MG tablet TAKE 1 TABLET BY MOUTH TWICE A DAY 180 tablet 1   Pirfenidone 267 MG TABS Take 3 tablets (801 mg total) by mouth with breakfast, with lunch, and with evening meal. (Patient taking differently: Take 2 tablets by mouth with breakfast, with lunch, and with evening meal.) 270 tablet 4   sodium bicarbonate 650 MG tablet TAKE ONE TABLET BY MOUTH 4 TIMES DAILY . (Patient taking differently: Take 650 mg by mouth 2 (two) times daily as needed for heartburn (reflux/indigestion.).) 120 tablet 5   albuterol (VENTOLIN HFA) 108 (90 Base) MCG/ACT inhaler Inhale 2 puffs into the lungs every 6 (six) hours as needed. 18 g 3   AMBULATORY NON FORMULARY MEDICATION Continuous positive airway pressure (CPAP) machine set at autotitration of H2O pressure, with all supplemental supplies as needed. 1 each 0   modafinil (PROVIGIL) 200 MG tablet Take 1 tablet (200 mg total) by mouth daily. 30 tablet 5   sertraline (ZOLOFT) 100 MG tablet Take 2 tablets (200 mg total) by mouth daily. 180 tablet 1   triamcinolone (NASACORT) 55 MCG/ACT AERO nasal inhaler Place 2 sprays into the nose daily as needed (allergies (when pollen is very high)).     WEGOVY 0.25 MG/0.5ML SOAJ Inject 0.25 mg into the skin once a week. Use this dose for 1 month (4 shots) and then increase to next higher dose. (Patient not taking: Reported on 05/15/2022) 2 mL 0   WEGOVY 0.5 MG/0.5ML SOAJ Inject 0.5 mg into the skin once a week. Use this dose for 1 month (4 shots) and then increase to next higher dose. (Patient not taking: Reported on 05/15/2022) 2 mL 0   WEGOVY 1 MG/0.5ML SOAJ Inject 1 mg into the skin once a week. Use this dose for 1 month (4 shots) and then increase to next higher dose. (Patient not taking: Reported on 05/15/2022) 2 mL 0   Current Facility-Administered Medications  Medication Dose Route Frequency Provider Last Rate Last Admin   sodium chloride flush (NS) 0.9 % injection 3 mL  3 mL  Intravenous Q12H [name], MD        Medication Side Effects: None  Allergies:  Allergies  Allergen Reactions   Fentanyl Other (See Comments)    Avoid opiates Other reaction(s): Other Avoid opiates   Other     04/06/18 Patient is a recovering narcotic abuser and, per wife, is NEVER to receive controlled substances (e.g. Narcotics, sedatives, etc.) Other reaction(s): Unknown 04/06/18 Patient is a recovering narcotic abuser and, per wife, is NEVER to receive controlled substances (e.g. Narcotics, sedatives, etc.)   Aspirin Nausea And Vomiting   Lipitor [Atorvastatin] Nausea And Vomiting    GI side effects.     Prednisone Other (See Comments)     Agitation Severe anger outbursts   Ofev [Nintedanib]     GI side effects   Contrast Media [Iodinated Contrast Media] Palpitations  Patient states he tolerates now   Nitroglycerin Nausea And Vomiting    Past Medical History:  Diagnosis Date   Arthritis    lower back   Bipolar disorder ([place])    GERD (gastroesophageal reflux disease)    Hyperlipidemia    Hypertension    Substance abuse in remission ([place]) 04/06/2018   AVOID OPIATES!    Family History  Problem Relation Age of Onset   Cancer Mother        breast   Depression Mother    Hyperlipidemia Mother    Dementia Mother    Alcohol abuse Father    Cancer Father        skin   Hyperlipidemia Father    Alzheimer's disease Father    Cancer Maternal Grandfather    Cancer Paternal Grandmother    Pancreatic cancer Paternal Uncle    Colon cancer Neg Hx    Esophageal cancer Neg Hx    Stomach cancer Neg Hx     Social History   Socioeconomic History   Marital status: Married    Spouse name: Jake [name]   Number of children: 1   Years of education: 14   Highest education level: Some college, no degree  Occupational History   Occupation: Disabled  Tobacco Use   Smoking status: Former    Packs/day: 1.00    Years: 28.00    Total pack years: 28.00    Types:  Cigarettes    Start date: 1982    Quit date: 2010    Years since quitting: 13.6   Smokeless tobacco: Never  Vaping Use   Vaping Use: Never used  Substance and Sexual Activity   Alcohol use: No   Drug use: No    Comment: 04/06/2018 Patient's wife wanted [place] to be very aware that her husband is a recovering narcotic abuser   Sexual activity: Yes  Other Topics Concern   Not on file  Social History Narrative   Lives with his wife, daughter, son-in-law and 2 two grandchildren. Likes to spend to time with grandchildren. Does not exercise due to back issues in the past and respiratory problems.   Social Determinants of Health   Financial Resource Strain: Low Risk  (02/04/2022)   Overall Financial Resource Strain (CARDIA)    Difficulty of Paying Living Expenses: Not hard at all  Food Insecurity: No Food Insecurity (02/04/2022)   Hunger Vital Sign    Worried About Running Out of Food in the Last Year: Never true    Ran Out of Food in the Last Year: Never true  Transportation Needs: No Transportation Needs (02/04/2022)   PRAPARE - Hydrologist (Medical): No    Lack of Transportation (Non-Medical): No  Physical Activity: Inactive (02/04/2022)   Exercise Vital Sign    Days of Exercise per Week: 0 days    Minutes of Exercise per Session: 0 min  Stress: No Stress Concern Present (02/04/2022)   [place]    Feeling of Stress : Not at all  Social Connections: Moderately Integrated (02/04/2022)   Social Connection and Isolation Panel [NHANES]    Frequency of Communication with Friends and Family: Once a week    Frequency of Social Gatherings with Friends and Family: More than three times a week    Attends Religious Services: More than 4 times per year    Active Member of [employer] or Organizations: No    Attends Archivist Meetings:  Never    Marital Status: Married  Human resources officer Violence: Not  At Risk (02/04/2022)   Humiliation, Afraid, Rape, and Kick questionnaire    Fear of Current or Ex-Partner: No    Emotionally Abused: No    Physically Abused: No    Sexually Abused: No    Past Medical History, Surgical history, Social history, and Family history were reviewed and updated as appropriate.   Please see review of systems for further details on the patient's review from today.   Objective:   Physical Exam:  There were no vitals taken for this visit.  Physical Exam Neurological:     Mental Status: He is alert and oriented to person, place, and time.     Cranial Nerves: No dysarthria.  Psychiatric:        Attention and Perception: Attention and perception normal.        Mood and Affect: Mood is anxious and depressed.        Speech: Speech normal.        Behavior: Behavior is cooperative.        Thought Content: Thought content normal. Thought content is not paranoid or delusional. Thought content does not include homicidal or suicidal ideation. Thought content does not include homicidal or suicidal plan.        Cognition and Memory: Cognition and memory normal.        Judgment: Judgment normal.     Comments: Insight intact     Lab Review:     Component Value Date/Time   NA 133 (L) 05/04/2022 1535   K 3.7 05/04/2022 1535   CL 92 (L) 05/04/2022 1535   CO2 33 (H) 05/04/2022 1535   GLUCOSE 99 05/04/2022 1535   BUN 27 (H) 05/04/2022 1535   CREATININE 1.20 05/04/2022 1535   CREATININE 1.21 09/12/2021 0000   CALCIUM 10.2 05/04/2022 1535   PROT 7.6 05/04/2022 1535   ALBUMIN 4.7 05/04/2022 1535   AST 21 05/04/2022 1535   ALT 20 05/04/2022 1535   ALKPHOS 48 05/04/2022 1535   BILITOT 0.3 05/04/2022 1535   GFRNONAA >60 09/03/2021 1854   GFRNONAA 60 03/20/2020 1044   GFRAA 69 03/20/2020 1044       Component Value Date/Time   WBC 8.6 03/12/2022 1237   RBC 4.75 03/12/2022 1237   HGB 13.0 03/12/2022 1237   HCT 38.8 (L) 03/12/2022 1237   PLT 293.0 03/12/2022 1237    MCV 81.8 03/12/2022 1237   MCH 28.5 09/03/2021 1854   MCHC 33.5 03/12/2022 1237   RDW 15.6 (H) 03/12/2022 1237   LYMPHSABS 1.6 03/12/2022 1237   MONOABS 0.9 03/12/2022 1237   EOSABS 0.3 03/12/2022 1237   BASOSABS 0.1 03/12/2022 1237    No results found for: "POCLITH", "LITHIUM"   No results found for: "PHENYTOIN", "PHENOBARB", "VALPROATE", "CBMZ"   .res Assessment: Plan:   Pt seen for 30 minutes and time spent discussing changes in medical condition and the effects this has had on his mood and anxiety. He asks about increasing Sertraline. Agree that increase in Sertraline may be helpful for both his anxiety and depressive s/s. Discussed potential benefits and side effects of higher dose of Sertraline. Will increase Sertraline to 200 mg po qd.  Discussed potential benefits, risks, and side effects of BuSpar.  May consider Buspar if anxiety worsens or does not improve.  Continue Modafinil 200 mg po qd for excessive daytime somnolence due to sleep apnea.  Pt to follow-up in 3 months or sooner if  clinically indicated.  Patient advised to contact office with any questions, adverse effects, or acute worsening in signs and symptoms.   Jake [name] was seen today for anxiety and depression.  Diagnoses and all orders for this visit:  Episodic mood disorder (HCC) -     sertraline (ZOLOFT) 100 MG tablet; Take 2 tablets (200 mg total) by mouth daily.  Generalized anxiety disorder -     sertraline (ZOLOFT) 100 MG tablet; Take 2 tablets (200 mg total) by mouth daily.  OSA on CPAP -     modafinil (PROVIGIL) 200 MG tablet; Take 1 tablet (200 mg total) by mouth daily.     Please see After Visit Summary for patient specific instructions.  Future Appointments  Date Time Provider [place]  06/02/2022 11:00 AM [facility] PHARMACIST [facility] None  07/02/2022 12:00 PM [facility] PFT ROOM [facility] None  07/02/2022  1:30 PM Brand Males, MD [facility] None  07/06/2022  1:20 PM  [name], MD [facility] None  02/10/2023 10:00 AM [facility]-NURSE HEALTH ADVISOR [facility] None    No orders of the defined types were placed in this encounter.     -------------------------------

## 2022-05-15 ENCOUNTER — Encounter: Payer: Self-pay | Admitting: Physician Assistant

## 2022-05-15 ENCOUNTER — Telehealth (INDEPENDENT_AMBULATORY_CARE_PROVIDER_SITE_OTHER): Payer: Medicare HMO | Admitting: Physician Assistant

## 2022-05-15 VITALS — BP 101/63 | HR 78 | Temp 98.3°F | Ht 71.0 in | Wt 234.0 lb

## 2022-05-15 DIAGNOSIS — R0602 Shortness of breath: Secondary | ICD-10-CM

## 2022-05-15 DIAGNOSIS — J309 Allergic rhinitis, unspecified: Secondary | ICD-10-CM | POA: Diagnosis not present

## 2022-05-15 DIAGNOSIS — J9611 Chronic respiratory failure with hypoxia: Secondary | ICD-10-CM

## 2022-05-15 DIAGNOSIS — J849 Interstitial pulmonary disease, unspecified: Secondary | ICD-10-CM | POA: Diagnosis not present

## 2022-05-15 MED ORDER — METHYLPREDNISOLONE SODIUM SUCC 125 MG IJ SOLR
125.0000 mg | Freq: Once | INTRAMUSCULAR | Status: AC
  Administered 2022-05-15: 125 mg via INTRAMUSCULAR

## 2022-05-15 NOTE — Patient Instructions (Signed)

## 2022-05-15 NOTE — Progress Notes (Signed)
Acute Office Visit  Subjective:     Patient ID: Jake [name], male    DOB: Jan 29, 1961, 61 y.o.   MRN: [id number]  Chief Complaint  Patient presents with   Nasal Congestion    HPI Patient is in today for SOB, cough, congestion for 2 days. Pt has chronic respiratory distress due to ILD and on 4L of O2. He also has allergies in the fall to ragweed. He feels like this is allergic exacerbation but does not want his lung function to decline. No fever, chills, body aches. He is coughing up clear mucus. He is fatigued and SOB. He was negative for covid with home test.   .. 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[place]) 05/27/2017   History of pancreatitis 05/27/2017   Diverticulitis 05/27/2017   Elevated fasting glucose 06/10/2017   Low testosterone 06/10/2017   Benign prostatic hyperplasia (BPH) with urinary urgency 06/12/2017   No energy 06/12/2017   Former smoker 07/25/2017   Cough 07/25/2017   Low testosterone in male 07/25/2017   Non-restorative sleep 07/25/2017   Anxiety 10/30/2015   Chronic nausea 10/30/2015   Major depressive disorder 10/30/2015   Migraine 10/30/2015   Neck pain 10/30/2015   Occipital neuralgia 10/30/2015   Substance abuse in remission ([place]) 04/07/2018   Bipolar disorder ([place]) 07/04/2018   Hearing difficulty of right ear 01/16/2019   IFG (impaired fasting glucose) 03/20/2019   Coronary artery disease due to calcified coronary lesion 11/03/2019   Thoracic aorta atherosclerosis ([place])  11/03/2019   Aortic atherosclerosis ([place]) 11/03/2019   Hepatic steatosis 11/03/2019   SOB (shortness of breath) on exertion 11/06/2019   Precordial pain 11/06/2019   Essential hypertension 11/06/2019   Obesity (BMI 30-39.9) 11/06/2019   Aneurysm of ascending aorta ([place]) 11/10/2019   Hypoxia 02/21/2020   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/2020   Sinobronchitis 07/10/2020   Decreased cardiac ejection fraction 11/14/2020   Cardiomyopathy ([place]) 11/14/2020   Dyspnea 11/14/2020   Left lower quadrant abdominal pain 87/56/4332   Chronic systolic congestive heart failure, NYHA class 3 ([place]) 05/30/2021   IPF (idiopathic pulmonary fibrosis) ([place]) 06/10/2021   Pre-diabetes 06/10/2021   Class 1 obesity due to excess calories with serious comorbidity and body mass index (BMI) of 31.0 to 31.9 in adult 06/10/2021   Cirrhosis of liver without ascites ([place]) 06/15/2021   Dysfunction of both eustachian tubes 07/08/2021   Eye discharge 08/11/2021   Photophobia of both eyes 08/11/2021   Eye pain, bilateral 08/11/2021   Diarrhea 09/05/2021   Abdominal distention 09/05/2021   Generalized abdominal pain 09/05/2021   Suspicious nevus 11/11/2021   Change in bowel habits    Gastritis and gastroduodenitis    Colon cancer screening    Hematochezia    Grade II internal hemorrhoids    Adenomatous polyp of sigmoid colon    Rotator cuff tendonitis, left 12/24/2021   Sciatica of left side 12/24/2021   ILD (interstitial lung disease) ([place]) 02/09/2022   Chronic respiratory failure with hypoxia ([place]) 02/10/2022   SOB (shortness of breath) 02/10/2022  Resolved Ambulatory Problems    Diagnosis Date Noted   Essential hypertension, benign 05/19/2016   Community acquired pneumonia of left lower lobe of lung 11/20/2016   Impacted cerumen of right ear 01/16/2019   Past Medical History:  Diagnosis Date   Arthritis    Hypertension      ROS  See HPI.     Objective:    BP 101/63   Pulse 78   Temp 98.3 F  (36.8 C) (Oral)   Ht '5\' 11"'$  (1.803 m)   Wt 234 lb (106.1 kg)   SpO2 99% Comment: 4L  BMI 32.64 kg/m  BP Readings from Last 3 Encounters:  05/15/22 101/63  05/05/22 (!) 140/80  03/12/22 104/68   Wt Readings from Last 3 Encounters:  05/15/22 234 lb (106.1 kg)  05/05/22 237 lb 6.4 oz (107.7 kg)  03/12/22 232 lb 12.8 oz (105.6 kg)      Physical Exam Constitutional:      Appearance: Normal appearance. He is obese.  HENT:     Head: Normocephalic.     Right Ear: Tympanic membrane normal.     Left Ear: Tympanic membrane normal.     Nose: Nose normal.     Mouth/Throat:     Mouth: Mucous membranes are moist.  Eyes:     Conjunctiva/sclera: Conjunctivae normal.  Cardiovascular:     Rate and Rhythm: Normal rate and regular rhythm.     Pulses: Normal pulses.     Heart sounds: Normal heart sounds.  Pulmonary:     Comments: Labored breathing on 4L of O2.  Musculoskeletal:     Right lower leg: No edema.     Left lower leg: No edema.  Neurological:     General: No focal deficit present.     Mental Status: He is alert.  Psychiatric:        Mood and Affect: Mood normal.             Assessment & Plan:  [name] KitchenMarland KitchenFredi was seen today for nasal congestion.  Diagnoses and all orders for this visit:  Allergic sinusitis -     methylPREDNISolone sodium succinate (SOLU-MEDROL) 125 mg/2 mL injection 125 mg  ILD (interstitial lung disease) (HCC)  Chronic respiratory failure with hypoxia (HCC)  SOB (shortness of breath)   Negative for covid On lung transplant waiting list Likely allergic exacerbation Solumedrol '125mg'$  IM done No signs of bacterial infection Continue O2 Rest and hydrate Ok to use mucinex Follow up as needed or with worsening symptoms  [name], PA-C

## 2022-05-18 MED ORDER — DOXYCYCLINE HYCLATE 100 MG PO TABS: 100.0000 mg | ORAL_TABLET | Freq: Two times a day (BID) | ORAL | 0 refills | Status: DC

## 2022-05-18 NOTE — Addendum Note (Signed)
Addended by: Donella Stade on: 05/18/2022 06:49 AM   Modules accepted: Orders

## 2022-05-18 NOTE — Progress Notes (Signed)
Called in Monday. Continues to cough and have congestion. More short of breath. Added doxycycline. He does not tolerate oral prednisone well.

## 2022-05-26 ENCOUNTER — Ambulatory Visit (INDEPENDENT_AMBULATORY_CARE_PROVIDER_SITE_OTHER): Payer: Medicare HMO

## 2022-05-26 ENCOUNTER — Telehealth: Payer: Self-pay | Admitting: Physician Assistant

## 2022-05-26 DIAGNOSIS — R0602 Shortness of breath: Secondary | ICD-10-CM

## 2022-05-26 DIAGNOSIS — J849 Interstitial pulmonary disease, unspecified: Secondary | ICD-10-CM

## 2022-05-26 DIAGNOSIS — R059 Cough, unspecified: Secondary | ICD-10-CM | POA: Diagnosis not present

## 2022-05-26 DIAGNOSIS — J9611 Chronic respiratory failure with hypoxia: Secondary | ICD-10-CM

## 2022-05-26 NOTE — Telephone Encounter (Signed)
Worsening SOB and cough with hypoxia. Completed abx. Ordered cXR.

## 2022-05-28 ENCOUNTER — Other Ambulatory Visit: Payer: Self-pay | Admitting: Physician Assistant

## 2022-05-28 DIAGNOSIS — J849 Interstitial pulmonary disease, unspecified: Secondary | ICD-10-CM | POA: Diagnosis not present

## 2022-05-28 NOTE — Progress Notes (Signed)
Discussed CXR.

## 2022-05-28 NOTE — Progress Notes (Signed)
No pneumonia. Left hemidiaphragm but stable. Spoke with patients wife.

## 2022-06-02 ENCOUNTER — Telehealth: Payer: Medicare HMO

## 2022-06-05 ENCOUNTER — Telehealth: Payer: Self-pay | Admitting: Psychiatry

## 2022-06-05 DIAGNOSIS — F411 Generalized anxiety disorder: Secondary | ICD-10-CM

## 2022-06-05 MED ORDER — BUSPIRONE HCL 15 MG PO TABS: ORAL_TABLET | ORAL | 5 refills | Status: DC

## 2022-06-05 NOTE — Telephone Encounter (Signed)
Patient notified of Rx and recommendations.  

## 2022-06-05 NOTE — Telephone Encounter (Signed)
Pt LVM @ 11:12a.  He said Janett Billow had increased one of his meds but it's not helping.  He said they discussed starting another medication for anxiety.  He would like for her to call it into [facility] [address] [place] [facility].  Next appt 12/14

## 2022-06-05 NOTE — Telephone Encounter (Signed)
Please let him know that Buspar has bene sent to his pharmacy. The instructions should be on the bottle about how to increase the dose. Please remind him that it usually takes a few weeks before it starts to help with anxiety.

## 2022-06-05 NOTE — Telephone Encounter (Signed)
Patient saying the increased sertraline is not giving him the benefit it is looking for. He rates his anxiety as 9-10/10 a lot of the time. He said he was recently diagnosed with a terminal lung disease that the only resolution is a transplant. You mentioned trying Buspar if the increased sertraline wasn't effective. He would [facility] [address] in [place].

## 2022-06-09 ENCOUNTER — Telehealth: Payer: Self-pay | Admitting: Internal Medicine

## 2022-06-09 ENCOUNTER — Other Ambulatory Visit: Payer: Self-pay | Admitting: Physician Assistant

## 2022-06-09 DIAGNOSIS — R051 Acute cough: Secondary | ICD-10-CM

## 2022-06-09 DIAGNOSIS — M62830 Muscle spasm of back: Secondary | ICD-10-CM

## 2022-06-09 DIAGNOSIS — Z87891 Personal history of nicotine dependence: Secondary | ICD-10-CM | POA: Diagnosis not present

## 2022-06-09 DIAGNOSIS — J9611 Chronic respiratory failure with hypoxia: Secondary | ICD-10-CM

## 2022-06-09 DIAGNOSIS — R0602 Shortness of breath: Secondary | ICD-10-CM

## 2022-06-09 DIAGNOSIS — J984 Other disorders of lung: Secondary | ICD-10-CM | POA: Diagnosis not present

## 2022-06-09 DIAGNOSIS — I429 Cardiomyopathy, unspecified: Secondary | ICD-10-CM | POA: Diagnosis not present

## 2022-06-09 DIAGNOSIS — J849 Interstitial pulmonary disease, unspecified: Secondary | ICD-10-CM

## 2022-06-09 DIAGNOSIS — M545 Low back pain, unspecified: Secondary | ICD-10-CM

## 2022-06-09 NOTE — Telephone Encounter (Signed)
    Call from Dr Sueanne Margarita at [facility]  He also agrees that patient does not have IPF.  CT scan is not consistent with UIP.  Currently pirfenidone is causing too many side effects.  He wants to stop it.  Because there is more suggestion of valvulitis he wants to start CellCept.  He is going to start the patient on 500 mg twice daily today.  Patient seeing me in approximately a month from now.  At the time I will increase to a total of 1 g twice daily.   He also wants the patient referred to pulmonary rehabilitation.   In terms of lung transplantation a lot of relative contraindications including weight and mild cardiomyopathy and intolerance to steroids.  At this point in time he wants patient to be more active and get into rehab  Of note patient only desaturated to 88% on room air and that walk test today   Plan  - Get patient in for CBC chemistry and liver function test in 2-3 weeks from today  -Refer to pulmonary rehabilitation

## 2022-06-10 NOTE — Telephone Encounter (Signed)
Referral to pulmonary rehab has been placed and labs have been ordered. Pt scheduled to have PFT 10/26 so he can get labs done at that visit. Called and spoke with pt and made him aware about bloodwork needed day of PFT and he verbalized understanding. Nothing further needed.

## 2022-06-13 ENCOUNTER — Other Ambulatory Visit: Payer: Self-pay | Admitting: Physician Assistant

## 2022-06-23 ENCOUNTER — Telehealth (HOSPITAL_COMMUNITY): Payer: Self-pay

## 2022-06-23 NOTE — Progress Notes (Signed)
HPI: Follow-up nonischemic cardiomyopathy.  Previously followed by Dr. Harriet Masson and at [facility]. Nuclear study March 2021 showed ejection fraction 54% and normal perfusion.  CPX March 2022 showed submaximal effort, moderate functional impairment due to pulmonary restriction.  High resolution chest CT March 2022 showed interstitial lung disease and aortic atherosclerosis; enlarged pulmonary trunk suggestive of pulmonary hypertension.  Cardiac catheterization March 2022 showed normal left main, minimal irregularities in the LAD, 2 small diagonals, circumflex, 2 obtuse marginals, posterior lateral, PDA and nondominant right coronary artery; ejection fraction 30 to 35% with mild left ventricular enlargement.  Chest CT December 2022 showed cardiomegaly, coronary calcification, aortic root measuring 4.2 cm, 4.1 cm ascending aorta. Echocardiogram June 2023 showed ejection fraction 40 to 73%, grade 1 diastolic dysfunction, moderate left atrial enlargement, mild mitral regurgitation.  Right heart catheterization June 2023 showed cardiac output 6.59 L/min, cardiac index 2.96 L/min, RA pressure 11, PA pressure 25/12 and pulmonary capillary wedge pressure 8.  Since last seen his dyspnea has markedly improved.  He can walk 1/2 mile now and then develops dyspnea.  There is no orthopnea, PND, pedal edema, chest pain or syncope.  Current Outpatient Medications  Medication Sig Dispense Refill   albuterol (VENTOLIN HFA) 108 (90 Base) MCG/ACT inhaler Inhale 2 puffs into the lungs every 6 (six) hours as needed. 18 g 3   AMBULATORY NON FORMULARY MEDICATION Continuous positive airway pressure (CPAP) machine set at autotitration of H2O pressure, with all supplemental supplies as needed. 1 each 0   busPIRone (BUSPAR) 15 MG tablet Take 1/3 tablet p.o. twice daily for 1 week, then take 2/3 tablet p.o. twice daily for 1 week, then take 1 tablet p.o. twice daily 60 tablet 5   chlorthalidone (HYGROTON) 25 MG tablet TAKE 1 TABLET BY  MOUTH EVERY DAY 90 tablet 1   colestipol (COLESTID) 1 g tablet TAKE 1 TABLET BY MOUTH EVERY DAY 90 tablet 1   dicyclomine (BENTYL) 20 MG tablet TAKE 1 TABLET BY MOUTH THREE TIMES A DAY 270 tablet 0   ENTRESTO 49-51 MG Take 1 tablet by mouth 2 (two) times daily.     finasteride (PROSCAR) 5 MG tablet TAKE 1 TABLET (5 MG TOTAL) BY MOUTH DAILY. 90 tablet 0   fluticasone (FLONASE) 50 MCG/ACT nasal spray Place into both nostrils daily.     ibuprofen (ADVIL) 800 MG tablet TAKE 1 TABLET BY MOUTH EVERY 8 HOURS AS NEEDED 90 tablet 5   KLOR-CON M10 10 MEQ tablet TAKE 1 TABLET BY MOUTH TWICE A DAY 60 tablet 1   loratadine (CLARITIN) 10 MG tablet Take 10 mg by mouth in the morning.     metoprolol succinate (TOPROL-XL) 50 MG 24 hr tablet Take 50 mg by mouth in the morning.     modafinil (PROVIGIL) 200 MG tablet Take 1 tablet (200 mg total) by mouth daily. 30 tablet 5   ondansetron (ZOFRAN) 8 MG tablet TAKE 1 TABLET BY MOUTH EVERY 8 HOURS AS NEEDED FOR NAUSEA FOR VOMITING 90 tablet 2   pantoprazole (PROTONIX) 40 MG tablet TAKE 1 TABLET BY MOUTH TWICE A DAY 180 tablet 1   Semaglutide,0.25 or 0.'5MG'$ /DOS, (OZEMPIC, 0.25 OR 0.5 MG/DOSE,) 2 MG/1.5ML SOPN Inject 0.5 mg into the skin once a week.     sertraline (ZOLOFT) 100 MG tablet Take 2 tablets (200 mg total) by mouth daily. 180 tablet 1   sodium bicarbonate 650 MG tablet TAKE ONE TABLET BY MOUTH 4 TIMES DAILY . (Patient taking differently: Take 650 mg  by mouth 2 (two) times daily as needed for heartburn (reflux/indigestion.).) 120 tablet 5   triamcinolone (NASACORT) 55 MCG/ACT AERO nasal inhaler Place 2 sprays into the nose daily as needed (allergies (when pollen is very high)).     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    Past Surgical History:  Procedure Laterality Date   ANKLE FRACTURE SURGERY Left    APPENDECTOMY     BIOPSY  11/18/2021   Procedure: BIOPSY;  Surgeon: Lavena Bullion, DO;  Location: [facility];  Service: Gastroenterology;;  EGD and COLON   CERVICAL FUSION     CHOLECYSTECTOMY     COLON SURGERY     COLONOSCOPY WITH PROPOFOL N/A 11/18/2021   Procedure: COLONOSCOPY WITH PROPOFOL;  Surgeon: Lavena Bullion, DO;  Location: [facility];  Service: Gastroenterology;  Laterality: N/A;   ESOPHAGOGASTRODUODENOSCOPY (EGD) WITH PROPOFOL N/A 11/18/2021   Procedure: ESOPHAGOGASTRODUODENOSCOPY (EGD) WITH PROPOFOL;  Surgeon: Lavena Bullion, DO;  Location: [facility];  Service: Gastroenterology;  Laterality: N/A;   HERNIA REPAIR     POLYPECTOMY  11/18/2021   Procedure: POLYPECTOMY;  Surgeon: Lavena Bullion, DO;  Location: [facility];  Service: Gastroenterology;;   RIGHT HEART CATH N/A 02/13/2022   Procedure: RIGHT HEART CATH;  Surgeon: Burnell Blanks, MD;  Location: [facility];  Service: Cardiovascular;  Laterality: N/A;    Social History   Socioeconomic History   Marital status: Married    Spouse name: Dawn   Number of children: 1   Years of education: 14   Highest education level: Some college, no degree  Occupational History   Occupation: Disabled  Tobacco Use   Smoking status: Former    Packs/day: 1.00    Years: 28.00    Total pack years: 28.00    Types: Cigarettes    Start date: 1982    Quit date: 2010    Years since quitting: 13.8   Smokeless tobacco: Never  Vaping Use   Vaping Use: Never used  Substance and Sexual Activity   Alcohol use: No   Drug use: No    Comment: 04/06/2018 Patient's wife wanted [place] to be very aware that her husband is a recovering narcotic abuser   Sexual  activity: Yes  Other Topics Concern   Not on file  Social History Narrative   Lives with his wife, daughter, son-in-law and 2 two grandchildren. Likes to spend to time with grandchildren. Does not exercise due to back issues in the past and respiratory problems.   Social Determinants of Health   Financial Resource Strain: Low Risk  (02/04/2022)   Overall Financial Resource Strain (CARDIA)    Difficulty of Paying Living Expenses: Not hard at all  Food Insecurity: No Food Insecurity (02/04/2022)   Hunger Vital Sign    Worried About Running Out of Food in the Last Year: Never true    Ran Out of Food in the Last Year: Never true  Transportation Needs: No Transportation Needs (02/04/2022)   PRAPARE - Transportation  Lack of Transportation (Medical): No    Lack of Transportation (Non-Medical): No  Physical Activity: Inactive (02/04/2022)   Exercise Vital Sign    Days of Exercise per Week: 0 days    Minutes of Exercise per Session: 0 min  Stress: No Stress Concern Present (02/04/2022)   [place]    Feeling of Stress : Not at all  Social Connections: Moderately Integrated (02/04/2022)   Social Connection and Isolation Panel [NHANES]    Frequency of Communication with Friends and Family: Once a week    Frequency of Social Gatherings with Friends and Family: More than three times a week    Attends Religious Services: More than 4 times per year    Active Member of [employer] or Organizations: No    Attends Archivist Meetings: Never    Marital Status: Married  Human resources officer Violence: Not At Risk (02/04/2022)   Humiliation, Afraid, Rape, and Kick questionnaire    Fear of Current or Ex-Partner: No    Emotionally Abused: No    Physically Abused: No    Sexually Abused: No    Family History  Problem Relation Age of Onset   Cancer Mother        breast   Depression Mother    Hyperlipidemia Mother    Dementia Mother     Alcohol abuse Father    Cancer Father        skin   Hyperlipidemia Father    Alzheimer's disease Father    Cancer Maternal Grandfather    Cancer Paternal Grandmother    Pancreatic cancer Paternal Uncle    Colon cancer Neg Hx    Esophageal cancer Neg Hx    Stomach cancer Neg Hx     ROS: no fevers or chills, productive cough, hemoptysis, dysphasia, odynophagia, melena, hematochezia, dysuria, hematuria, rash, seizure activity, orthopnea, PND, pedal edema, claudication. Remaining systems are negative.  Physical Exam: Well-developed well-nourished in no acute distress.  Skin is warm and dry.  HEENT is normal.  Neck is supple.  Chest is clear to auscultation with normal expansion.  Cardiovascular exam is regular rate and rhythm.  Abdominal exam nontender or distended. No masses palpated. Extremities show no edema. neuro grossly intact  A/P  1 interstitial lung disease-managed by pulmonary.  He is markedly improved compared to last office visit.  2 nonischemic cardiomyopathy-we will continue Entresto and Toprol at present dose.  Ejection fraction mildly reduced.  3 hypertension-patient's blood pressure is borderline; he will follow this at home and we will increase Entresto if necessary.  4 chronic combined systolic/diastolic congestive heart failure-he is euvolemic on examination.  Continue diuretic at present dose.  He has declined SGLT2 inhibitor previously.  5 hyperlipidemia-intolerant to statins.  6 previous syncopal episode-no recurrences since last office visit.  7 history of coronary calcification  8 dilated thoracic aorta-plan follow-up CTA December 2023.  9 obstructive sleep apnea-continue CPAP.  Kirk Ruths, MD

## 2022-06-23 NOTE — Telephone Encounter (Signed)
Called and spoke with pt in regards to PR, pt stated he would liked to talk to his doctor about it 1st. Adv pt we rec'ved his referral from Dr. Chase Caller.    Placed pt ppw back in ready to schedule bin.

## 2022-06-26 ENCOUNTER — Encounter: Payer: Self-pay | Admitting: Physician Assistant

## 2022-06-26 ENCOUNTER — Ambulatory Visit (INDEPENDENT_AMBULATORY_CARE_PROVIDER_SITE_OTHER): Payer: Medicare HMO | Admitting: Physician Assistant

## 2022-06-26 VITALS — BP 133/71 | HR 88 | Temp 97.8°F | Ht 71.0 in | Wt 226.0 lb

## 2022-06-26 DIAGNOSIS — J849 Interstitial pulmonary disease, unspecified: Secondary | ICD-10-CM | POA: Diagnosis not present

## 2022-06-26 DIAGNOSIS — J9611 Chronic respiratory failure with hypoxia: Secondary | ICD-10-CM

## 2022-06-26 DIAGNOSIS — J40 Bronchitis, not specified as acute or chronic: Secondary | ICD-10-CM

## 2022-06-26 DIAGNOSIS — R0602 Shortness of breath: Secondary | ICD-10-CM

## 2022-06-26 DIAGNOSIS — J329 Chronic sinusitis, unspecified: Secondary | ICD-10-CM

## 2022-06-26 MED ORDER — METHYLPREDNISOLONE SODIUM SUCC 125 MG IJ SOLR
125.0000 mg | Freq: Once | INTRAMUSCULAR | Status: AC
  Administered 2022-06-26: 125 mg via INTRAMUSCULAR

## 2022-06-26 MED ORDER — DOXYCYCLINE HYCLATE 100 MG PO TABS: 100.0000 mg | ORAL_TABLET | Freq: Two times a day (BID) | ORAL | 0 refills | Status: DC

## 2022-06-26 NOTE — Progress Notes (Signed)
Acute Office Visit  Subjective:     Patient ID: Jake [name], male    DOB: 05-Feb-1961, 61 y.o.   MRN: [id number]  Chief Complaint  Patient presents with   Cough    HPI Patient is in today for cough, SOB, chest congestion for the last 4-5 days. He has history of ILD and chronic hypoxia and recently had consult for lung transplant but miraculously he began to start breathing better and not be hypoxic. He has not been on O2 since early October. No fever, chills, body aches. No other sick contacts. He is taking OTC medications with minimal improvement. He was walking 1/2 mile a day but now he cannot.   .. 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[place]) 05/27/2017   History of pancreatitis 05/27/2017   Diverticulitis 05/27/2017   Elevated fasting glucose 06/10/2017   Low testosterone 06/10/2017   Benign prostatic hyperplasia (BPH) with urinary urgency 06/12/2017   No energy 06/12/2017   Former smoker 07/25/2017   Cough 07/25/2017   Low testosterone in male 07/25/2017   Non-restorative sleep 07/25/2017   Anxiety 10/30/2015   Chronic nausea 10/30/2015   Major depressive disorder 10/30/2015   Migraine 10/30/2015   Neck pain 10/30/2015   Occipital neuralgia 10/30/2015   Substance abuse in remission ([place]) 04/07/2018   Bipolar disorder ([place]) 07/04/2018   Hearing difficulty of right ear 01/16/2019   IFG (impaired fasting glucose) 03/20/2019   Coronary artery disease due to calcified coronary lesion 11/03/2019    Thoracic aorta atherosclerosis ([place]) 11/03/2019   Aortic atherosclerosis ([place]) 11/03/2019   Hepatic steatosis 11/03/2019   SOB (shortness of breath) on exertion 11/06/2019   Precordial pain 11/06/2019   Essential hypertension 11/06/2019   Obesity (BMI 30-39.9) 11/06/2019   Aneurysm of ascending aorta ([place]) 11/10/2019   Hypoxia 02/21/2020   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/2020   Sinobronchitis 07/10/2020   Decreased cardiac ejection fraction 11/14/2020   Cardiomyopathy ([place]) 11/14/2020   Dyspnea 11/14/2020   Left lower quadrant abdominal pain 72/53/6644   Chronic systolic congestive heart failure, NYHA class 3 ([place]) 05/30/2021   IPF (idiopathic pulmonary fibrosis) ([place]) 06/10/2021   Pre-diabetes 06/10/2021   Class 1 obesity due to excess calories with serious comorbidity and body mass index (BMI) of 31.0 to 31.9 in adult 06/10/2021   Cirrhosis of liver without ascites ([place]) 06/15/2021   Dysfunction of both eustachian tubes 07/08/2021   Eye discharge 08/11/2021   Photophobia of both eyes 08/11/2021   Eye pain, bilateral 08/11/2021   Diarrhea 09/05/2021   Abdominal distention 09/05/2021   Generalized abdominal pain 09/05/2021   Suspicious nevus 11/11/2021   Change in bowel habits    Gastritis and gastroduodenitis    Colon cancer screening    Hematochezia    Grade II internal hemorrhoids    Adenomatous polyp of sigmoid colon    Rotator cuff tendonitis, left 12/24/2021   Sciatica of left side 12/24/2021   ILD (interstitial lung disease) ([place]) 02/09/2022   Chronic respiratory failure with hypoxia ([place]) 02/10/2022   SOB (  shortness of breath) 02/10/2022   Resolved Ambulatory Problems    Diagnosis Date Noted   Essential hypertension, benign 05/19/2016   Community acquired pneumonia of left lower lobe of lung 11/20/2016   Impacted cerumen of right ear 01/16/2019   Past Medical History:  Diagnosis Date   Arthritis    Hypertension      ROS  See HPI>     Objective:     BP 133/71   Pulse 88   Temp 97.8 F (36.6 C) (Oral)   Ht '5\' 11"'$  (1.803 m)   Wt 226 lb (102.5 kg)   SpO2 93%   BMI 31.52 kg/m  BP Readings from Last 3 Encounters:  06/26/22 133/71  05/15/22 101/63  05/05/22 (!) 140/80   Wt Readings from Last 3 Encounters:  06/26/22 226 lb (102.5 kg)  05/15/22 234 lb (106.1 kg)  05/05/22 237 lb 6.4 oz (107.7 kg)      Physical Exam Constitutional:      Appearance: Normal appearance. He is obese.  HENT:     Head: Normocephalic.     Right Ear: Tympanic membrane normal.     Left Ear: Tympanic membrane normal.     Nose: Nose normal.     Mouth/Throat:     Mouth: Mucous membranes are moist.     Pharynx: No posterior oropharyngeal erythema.  Eyes:     Conjunctiva/sclera: Conjunctivae normal.  Neck:     Vascular: No carotid bruit.  Cardiovascular:     Rate and Rhythm: Normal rate and regular rhythm.  Pulmonary:     Effort: Pulmonary effort is normal.     Comments: Crackles at right lung base Musculoskeletal:     Cervical back: Normal range of motion and neck supple.  Neurological:     General: No focal deficit present.     Mental Status: He is alert and oriented to person, place, and time.  Psychiatric:        Mood and Affect: Mood normal.          Assessment & Plan:  [name] KitchenMarland KitchenMeshulem was seen today for cough.  Diagnoses and all orders for this visit:  Sinobronchitis -     doxycycline (VIBRA-TABS) 100 MG tablet; Take 1 tablet (100 mg total) by mouth 2 (two) times daily.  Chronic respiratory failure with hypoxia (HCC) -     methylPREDNISolone sodium succinate (SOLU-MEDROL) 125 mg/2 mL injection 125 mg  SOB (shortness of breath) -     methylPREDNISolone sodium succinate (SOLU-MEDROL) 125 mg/2 mL injection 125 mg  ILD (interstitial lung disease) (HCC)   Pulse ox 93 percent withOUT oxygen Solumedrol given IM today due to not tolerating prednisone orally Start doxycycline for 10 days Continue to use albuterol inhaler as  needed Follow up as needed  [name], PA-C

## 2022-06-27 DIAGNOSIS — J849 Interstitial pulmonary disease, unspecified: Secondary | ICD-10-CM | POA: Diagnosis not present

## 2022-06-29 ENCOUNTER — Ambulatory Visit (INDEPENDENT_AMBULATORY_CARE_PROVIDER_SITE_OTHER): Payer: Medicare HMO

## 2022-06-29 DIAGNOSIS — R0602 Shortness of breath: Secondary | ICD-10-CM

## 2022-06-29 DIAGNOSIS — R051 Acute cough: Secondary | ICD-10-CM | POA: Diagnosis not present

## 2022-06-29 DIAGNOSIS — R059 Cough, unspecified: Secondary | ICD-10-CM | POA: Diagnosis not present

## 2022-06-29 MED ORDER — METHYLPREDNISOLONE 4 MG PO TBPK: ORAL_TABLET | ORAL | 0 refills | Status: DC

## 2022-06-29 NOTE — Progress Notes (Signed)
No pneumonia. Lung markings increased at left lung base. No pleural effusion. Try the medrol dose pack that I sent to pharmacy.

## 2022-06-29 NOTE — Telephone Encounter (Signed)
Pt continues to feel bad and short of breath O2 in low 90s Will get CXR Will do trial of methylprednisolone.

## 2022-07-02 ENCOUNTER — Telehealth: Payer: Self-pay | Admitting: Internal Medicine

## 2022-07-02 ENCOUNTER — Ambulatory Visit: Payer: Medicare HMO | Admitting: Internal Medicine

## 2022-07-02 ENCOUNTER — Ambulatory Visit (INDEPENDENT_AMBULATORY_CARE_PROVIDER_SITE_OTHER): Payer: Medicare HMO | Admitting: Internal Medicine

## 2022-07-02 ENCOUNTER — Encounter: Payer: Self-pay | Admitting: *Deleted

## 2022-07-02 DIAGNOSIS — J849 Interstitial pulmonary disease, unspecified: Secondary | ICD-10-CM

## 2022-07-02 LAB — BASIC METABOLIC PANEL
BUN: 31 mg/dL — ABNORMAL HIGH (ref 6–23)
CO2: 33 mEq/L — ABNORMAL HIGH (ref 19–32)
Calcium: 10.3 mg/dL (ref 8.4–10.5)
Chloride: 91 mEq/L — ABNORMAL LOW (ref 96–112)
Creatinine, Ser: 1.24 mg/dL (ref 0.40–1.50)
GFR: 62.76 mL/min (ref >60.00)
Glucose, Bld: 102 mg/dL — ABNORMAL HIGH (ref 70–99)
Potassium: 3.5 mEq/L (ref 3.5–5.1)
Sodium: 133 mEq/L — ABNORMAL LOW (ref 135–145)

## 2022-07-02 LAB — CBC WITH DIFFERENTIAL/PLATELET
Basophils Absolute: 0 10*3/uL (ref 0.0–0.1)
Basophils Relative: 0.4 % (ref 0.0–3.0)
Eosinophils Absolute: 0.1 10*3/uL (ref 0.0–0.7)
Eosinophils Relative: 0.6 % (ref 0.0–5.0)
HCT: 42.9 % (ref 39.0–52.0)
Hemoglobin: 14.4 g/dL (ref 13.0–17.0)
Lymphocytes Relative: 20.6 % (ref 12.0–46.0)
Lymphs Abs: 1.9 10*3/uL (ref 0.7–4.0)
MCHC: 33.5 g/dL (ref 30.0–36.0)
MCV: 83.5 fl (ref 78.0–100.0)
Monocytes Absolute: 0.9 10*3/uL (ref 0.1–1.0)
Monocytes Relative: 9.9 % (ref 3.0–12.0)
Neutro Abs: 6.2 10*3/uL (ref 1.4–7.7)
Neutrophils Relative %: 68.5 % (ref 43.0–77.0)
Platelets: 383 10*3/uL (ref 150.0–400.0)
RBC: 5.14 Mil/uL (ref 4.22–5.81)
RDW: 14.8 % (ref 11.5–15.5)
WBC: 9.1 10*3/uL (ref 4.0–10.5)

## 2022-07-02 LAB — PULMONARY FUNCTION TEST
DL/VA % pred: 86 %
DL/VA: 3.61 ml/min/mmHg/L
DLCO cor % pred: 43 %
DLCO cor: 12.27 ml/min/mmHg
DLCO unc % pred: 43 %
DLCO unc: 12.27 ml/min/mmHg
FEF 25-75 Pre: 4.01 L/sec
FEF2575-%Pred-Pre: 132 %
FEV1-%Pred-Pre: 54 %
FEV1-Pre: 2.01 L
FEV1FVC-%Pred-Pre: 121 %
FEV6-%Pred-Pre: 46 %
FEV6-Pre: 2.2 L
FEV6FVC-%Pred-Pre: 104 %
FVC-%Pred-Pre: 44 %
FVC-Pre: 2.2 L
Pre FEV1/FVC ratio: 91 %
Pre FEV6/FVC Ratio: 100 %

## 2022-07-02 LAB — HEPATIC FUNCTION PANEL
ALT: 22 U/L (ref 0–53)
AST: 18 U/L (ref 0–37)
Albumin: 5.1 g/dL (ref 3.5–5.2)
Alkaline Phosphatase: 44 U/L (ref 39–117)
Bilirubin, Direct: 0.1 mg/dL (ref 0.0–0.3)
Total Bilirubin: 0.5 mg/dL (ref 0.2–1.2)
Total Protein: 7.8 g/dL (ref 6.0–8.3)

## 2022-07-02 NOTE — Progress Notes (Signed)
Spirometry and DLCO Performed Today.  

## 2022-07-02 NOTE — Telephone Encounter (Signed)
Letter has been written and printed and signed by Joellen Jersey. Called and spoke with pt letting him know this was done and he verbalized understanding. Nothing further needed.

## 2022-07-02 NOTE — Telephone Encounter (Signed)
MR is out of the office until Monday, 10/30. Katie, please advise if you are okay with [place] providing pt a letter.

## 2022-07-02 NOTE — Telephone Encounter (Signed)
Yes, please provide letter that he needs to travel with his POC machine. Bring to me to sign once done. Thanks.

## 2022-07-02 NOTE — Patient Instructions (Signed)
Spirometry and DLCO Performed Today.  

## 2022-07-06 ENCOUNTER — Encounter: Payer: Self-pay | Admitting: Cardiology

## 2022-07-06 ENCOUNTER — Ambulatory Visit: Payer: Medicare HMO | Admitting: Cardiology

## 2022-07-06 VITALS — BP 136/77 | HR 85 | Ht 71.0 in | Wt 225.8 lb

## 2022-07-06 DIAGNOSIS — I10 Essential (primary) hypertension: Secondary | ICD-10-CM

## 2022-07-06 DIAGNOSIS — R55 Syncope and collapse: Secondary | ICD-10-CM | POA: Diagnosis not present

## 2022-07-06 DIAGNOSIS — I420 Dilated cardiomyopathy: Secondary | ICD-10-CM | POA: Diagnosis not present

## 2022-07-06 NOTE — Patient Instructions (Signed)
  Follow-Up: At [facility], you and your health needs are our priority.  As part of our continuing mission to provide you with exceptional heart care, we have created designated Provider Care Teams.  These Care Teams include your primary Cardiologist (physician) and Advanced Practice Providers (APPs -  Physician Assistants and Nurse Practitioners) who all work together to provide you with the care you need, when you need it.  We recommend signing up for the patient portal called "MyChart".  Sign up information is provided on this After Visit Summary.  MyChart is used to connect with patients for Virtual Visits (Telemedicine).  Patients are able to view lab/test results, encounter notes, upcoming appointments, etc.  Non-urgent messages can be sent to your provider as well.   To learn more about what you can do with MyChart, go to [email or web address].    Your next appointment:   12 month(s)  The format for your next appointment:   In Person  Provider:   Brian Crenshaw, MD   

## 2022-07-07 ENCOUNTER — Other Ambulatory Visit: Payer: Self-pay | Admitting: Physician Assistant

## 2022-07-07 DIAGNOSIS — N401 Enlarged prostate with lower urinary tract symptoms: Secondary | ICD-10-CM

## 2022-07-07 DIAGNOSIS — K580 Irritable bowel syndrome with diarrhea: Secondary | ICD-10-CM

## 2022-07-10 ENCOUNTER — Ambulatory Visit: Payer: Medicare HMO | Admitting: Internal Medicine

## 2022-07-13 NOTE — Research (Signed)
Title: Chronic Fibrosing Interstitial Lung Disease with Progressive Phenotype Prospective Outcomes (ILD-PRO) Registry    Protocol #: IPF-PRO-SUB, Clinical Trials # [id number], Sponsor: [employer]   Protocol Version Amendment 4 dated 12Sep2019  and confirmed current on  Consent Version for today's visit date of  Is [place] IRB Approved Version 11 Aug 2018 Revised 11 Aug 2018   Objectives:  Describe current approaches to diagnosis and treatment of chronic fibrosing ILDs with progressive phenotype  Describe the natural history of chronic fibrosing ILDs with progressive phenotype  Assess quality of life from self-administered participant reported questionnaires for each disease group  Describe participant interactions with the healthcare system, describe treatment practices across multiple institutions for each disease group  Collect biological samples linked to well characterized chronic fibrosing ILDs with progressive phenotype to identify disease biomarkers  Collect data and biological samples that will support future research studies.                                            Key Inclusion Criteria: Willing and able to provide informed consent  Age ? 30 years  Diagnosis of a non-IPF ILD of any duration, including, but not limited to Idiopathic Non-Specific Interstitial Pneumonia (INSIP), Unclassifiable Idiopathic Interstitial Pneumonias (IIPs), Interstitial Pneumonia with Autoimmune Features (IPAF), Autoimmune ILDs such as Rheumatoid Arthritis (RA-ILD) and Systemic Sclerosis (SSC-ILD), Chronic Hypersensitivity Pneumonitis (HP), Sarcoidosis or Exposure-related ILDs such as asbestosis.  Chronic fibrosing ILD defined by reticular abnormality with traction bronchiectasis with or without honeycombing confirmed by chest HRCT scan and/or lung biopsy.  Progressive phenotype as defined by fulfilling at least one of the criteria below of fibrotic changes (progression set point)  within the last 24 months regardless of treatment considered appropriate in individual ILDs:  decline in FVC % predicted (% pred) based on >10% relative decline  decline in FVC % pred based on ? 5 - <10% relative decline in FVC combined with worsening of respiratory symptoms as assessed by the site investigator  decline in FVC % pred based on ? 5 - <10% relative decline in FVC combined with increasing extent of fibrotic changes on chest imaging (HRCT scan) as assessed by the site investigator  decline in DLCO % pred based on ? 10% relative decline  worsening of respiratory symptoms as well as increasing extent of fibrotic changes on chest imaging (HRCT scan) as assessed by the site investigator independent of FVC change.     Key Exclusion Criteria: Malignancy, treated or untreated, other than skin or early stage prostate cancer, within the past 5 years  Currently listed for lung transplantation at the time of enrollment  Currently enrolled in a clinical trial at the time of enrollment in this registry       Clinical Research Coordinator / Research RN note : This visit for Jake [name]  Subject [id number] with DOB:1961-02-17 on 05/05/2022 for the above protocol is Visit/Encounter baseline/enrollment and is for purpose of research.    Subject expressed continued interest and consent in continuing as a study subject. Subject confirmed that there was no change in contact information (e.g. address, telephone, email). Subject thanked for participation in research and contribution to science.     During this visit on 05/05/2022 , the subject completed the blood work and questionnaires per the above referenced protocol. Please refer to the subject's paper source binder for further details.   Signed  by [place] Assistant [facility]  [place], [place] 05/05/2022

## 2022-07-20 ENCOUNTER — Telehealth: Payer: Self-pay | Admitting: Internal Medicine

## 2022-07-20 ENCOUNTER — Encounter: Payer: Self-pay | Admitting: Internal Medicine

## 2022-07-20 ENCOUNTER — Ambulatory Visit: Payer: Medicare HMO | Admitting: Internal Medicine

## 2022-07-20 VITALS — BP 122/72 | HR 85 | Ht 71.0 in | Wt 224.0 lb

## 2022-07-20 DIAGNOSIS — J849 Interstitial pulmonary disease, unspecified: Secondary | ICD-10-CM

## 2022-07-20 NOTE — Progress Notes (Signed)
OV 02/03/2022 -transfer of care to Dr. Chase Caller at the ILD clinic by Dr. Jenetta Downer  Subjective:  Patient ID: Jake [name], male , DOB: 12/21/1960 , age 61 y.o. , MRN: [id number] , ADDRESS: [place] [address] PCP Donella Stade, PA-C Patient Care Team: Lavada Mesi as PCP - General (Family Medicine) Berniece Salines, DO as PCP - Cardiology (Cardiology) Karie Chimera, PA-C as Referring Physician (Pulmonary Disease) Darius Bump, [facility] as Pharmacist (Pharmacist)  This Provider for this visit: Treatment Team:  Attending Provider: Brand Males, MD    02/03/2022 -   Chief Complaint  Patient presents with   Follow-up    Pt switching from Dr. Ander Slade to MR for ILD eval. Pt states lately he has had problems of SOB that can happen at any time and also has a chronic cough.     HPI Jake [name] 61 y.o. -history is provided by the wife, patient and also reviewed the medical records and the ILD questionnaire.  He has had insidious onset of shortness of breath for the last few years.  Chart review it appears that in 2020 when he got his respiratory infection [in retrospect he thinks this was COVID before the formal declaration of the pandemic] and since then he has been having progressive shortness of breath.  Especially over the last year or so his progression is worse..  For the last 1 year has been on oxygen 2 L.  He feels he haseven progressed more in the last few months.  And is now needing oxygen 4 L at rest at all the time.  Sometimes it is 3 L.  Wife says he easily desaturates into the 62s and occasionally into the 36s when he exerts.  When he does the bed or showers or does chores increases to 4 L.  He feels a portable oxygen is not helping him anymore.  Particularly more progressive in the last few months.  Recently was tried on prednisone.  He is not sure if it helped but it made him very angry had irate emotional outburst and he could not take prednisone  anymore.  Through all this late last year in 2022 he was off his Entresto for heart failure.  This made the shortness of breath worse but then he went back on it in January and he got better but nevertheless overall progressive dyspnea.     [place] Integrated Comprehensive ILD Questionnaire  Symptoms:     Past Medical History :  Status post CABG in 2004.  He also had cardiac stents approximately 6-12 months ago according to his history with 1 repair of the stent.  Still it did not help the shortness of breath.  He has had COVID at least 3 times.  The first 1 was in January 2020 right before the onset of the pandemic.  The second 1 was in January 2020 and the third was in December 2022 during small bowel obstruction admission.  This was an incidental finding according to the wife  He has chronic systolic heart failure  Other issues include nausea, irritable bowel syndrome, acid reflux back and neck pain  He has obstructive sleep apnea  He has a history of pneumonia several years ago  ROS:  -Positive for fatigue for the last several months, arthralgia for the last several years, dysphagia for the last several decades, dry eyes for the last several months, nausea for the last several years, acid reflux for the  last several years also snoring for the last several years  FAMILY HISTORY of LUNG DISEASE:  Negative for any lung disease  PERSONAL EXPOSURE HISTORY:  -Did smoke in the remote past and quit -Smoked between 1985 and 2008 1 and half packs per day. -Possible cocaine use between 1991 and marijuana use between 1978 and 2008 ": Very little to none   HOME  EXPOSURE and HOBBY DETAILS :  -20 years ago he lived in a house that had black mold he lived there for few years but has not been exposed to that for 20 years.  -Was using a Phillips responding CPAP machine for a few years.  It was subject to recall because of foam silicone  contamination issues that caused respiratory  complications.  He stopped using this in 2021 shortly after the recall was issued.  -He is now living in his daughter's house for the last 3 months.  -The old house did have mold.  It also had mildew in the shower curtain. 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  Restrictive pattern with mild desaturations.  -Echocardiogram March 2022 ` -Ejection fraction [id number], grade 1 diastolic dysfunction  - BNP May 2023  - 31  - RHC June 2023  -  Constance Holster RHC   Echo 02/12/22: EF 40-45%, mild MR. LA moderately dilated. RV size and function normal. GRade 1 DD. [place] 02/12/22: RA- 5 mmHg; PA 25/12 mmHg, mean PAP 17 mmHg, Wedge 8 mmHg, CO (fick) 6.59 L/mim, CI-2.96 L/min meter2.   03/12/2022: Today - follow up Patient presents today with wife after being started on Ofev. He is having significant nausea with his first dose of Ofev. He wakes up in the morning, takes his zofran and eats a small snack. He then takes his Ofev at 8 am with food as well, and has tried multiple different food options. He then becomes extremely nauseated around 845 and has to go lay down until he is able to take his second dose of Zofran around 1/2 pm. He finally begins to feel better and is able to tolerate his second dose. He is understandably frustrated by this  and does not wish to have this poor quality of life from his medications. He denies any vomiting or diarrhea. He has had some weight gain as he has had to eat so often with the Ofev to try to curve his nausea.   From a respiratory standpoint, he continues to get winded with minimal exertion. He can be changing the bed at home or walk 50 ft to the mailbox and has to sit down afterwards to rest and catch his breath. He has also had drops in his oxygen into the 70's-80's despite his supplemental 4 lpm so he has been minimally active because of this. No significant cough or wheezing. He has not noticed any difference in his  activity tolerance or lung function since starting the Ofev, but has felt so terrible that he's not sure if he would notice much.           OV 05/05/2022  Subjective:  Patient ID: Jake [name], male , DOB: [birth year] , age 41 y.o. , MRN: [id number] , ADDRESS: [place] [address] PCP Donella Stade, PA-C Patient Care Team: Lavada Mesi as PCP - General (Family Medicine) Berniece Salines, DO as PCP - Cardiology (Cardiology) Karie Chimera, PA-C as Referring Physician (Pulmonary Disease) Darius Bump, [facility] as Pharmacist (Pharmacist)  This Provider for this visit: Treatment Team:  Attending Provider: Brand Males, MD    05/05/2022 -   Chief Complaint  Patient presents with   Follow-up    Pt states he feels like his breathing has become worse since last visit. Also states he has had a dry cough, chest discomfort, and has been having GI issues due to the [place].     HPI Jake [name] 61 y.o. --presents for follow-up.  Presents with his wife.  He has non-- IPF progressive phenotype ILD.  Suspected etiology usage of CPAP machine [employer].  He tried nintedanib but he had significant intolerance.  He is currently on fourth week of pirfenidone at full dose for the last 1 week.  He is having significant diarrhea nausea and  fatigue because of this.  At 2 pills 3 times daily it was more tolerable.  He is willing to go back to 2 pills 3 times daily and try.  He also significant anxiety and depression.  He is on Zoloft.  In the past he has tried SNRI or some other agent according to the wife but he had side effects.  I have recommended they talk with the primary care physician about this.  He is also trying to lose weight.  He has been prescribed Wegovy but there is no stock available.  He has been looked at [facility] for lung transplant but given his low EF he has been disqualified and they do not want to see him.  He did have a right heart catheterization in June 2023 and this was normal.  According to him after some delay from our office he has been referred to Sueanne Margarita at [facility] for lung transplant evaluation.  But he has not heard back from them.  I personally emailed Dr. Alver Sorrow office.  Also he tells me that with 4 L nasal cannula at rest he is fine but when he does any laundry he desaturates into the 80s.  He has tried increasing to 5 L nasal cannula.  I advised that he could increase it further and keep his pulse ox over 86% and monitor it.  He is willing to do that.  They are not interested in immunomodulators right now because of young kids in the house.    Overall he and his wife are worried that he is declining.  He did have pulmonary function test today and?  Slight decline compared to May 2023.[name] Kitchen  He had liver function test monitoring blood work yesterday but so far the results are not back yet.   SUMMARY VISIT Dr Ovid Curd 06/09/22 at [facility]   He also agrees that patient does not have IPF.  CT scan is not consistent with UIP.  Currently pirfenidone is causing too many side effects.  He wants to stop it.  Because there is more suggestion of valvulitis he wants to  start CellCept.  He is going to start the patient on 500 mg twice daily today.  Patient seeing me in approximately a month from now.  At  the time I will increase to a total of 1 g twice daily.   He also wants the patient referred to pulmonary rehabilitation.   In terms of lung transplantation a lot of relative contraindications including weight and mild cardiomyopathy and intolerance to steroids.  At this point in time he wants patient to be more active and get into rehab  Of note patient only desaturated to 88% on room air and that walk test today   Plan  - Get patient in for CBC chemistry and liver function test in 2-3 weeks from today  -Refer to pulmonary rehabilitation   OV 07/20/2022  Subjective:  Patient ID: Jake [name], male , DOB: 05-01-1961 , age 46 y.o. , MRN: [id number] , ADDRESS: [place] [id number] PCP Donella Stade, PA-C Patient Care Team: Lavada Mesi as PCP - General (Family Medicine) Berniece Salines, DO as PCP - Cardiology (Cardiology) Karie Chimera, PA-C as Referring Physician (Pulmonary Disease) Darius Bump, [facility] as Pharmacist (Pharmacist)  This Provider for this visit: Treatment Team:  Attending Provider: Brand Males, MD    07/20/2022 -   Chief Complaint  Patient presents with   Follow-up    Pt states he has been doing okay since last visit. Has not worn his O2 in about a month and states that he has been walking about 0.49mle a day.     HPI Jake Fridman89y.o. -returns for follow-up with his wife.  Since his last visit he has lost over 10 pounds because of Ozempic.  He started his Ozempic.  Then approximately a month ago he did [facility]  At the time his exercise hypoxemia improved.  He started beginning to feel better as well.  According to the wife and him a day prior to that he was still feeling miserable but on the day of the visit he started noticing a turnaround and since then he has had sustained turnaround.  He was having side effects from pirfenidone and this was stopped early October 2023.  Dr.  NOvid Curdrecommended mycophenolate mofetil [patient mistakenly thought this was metoprolol at this visit but later confirmed that he has been on metoprolol for a long time to Dr. CStanford Breedfor his heart failure].  Since then he is having sustained improvement in dyspnea.  Also he was able to go to [place] on flat ground for a long time without desaturating.  He still desaturates but it is only for heavy exertion such as vacuuming or lifting heavy load.  He will go into the low 80s and then rest to recover.  He does not use his oxygen for his hypoxemia.  Instead he opted to rest.  At this point in time he is telling me because of his improvement he does not want to do his mycophenolate [Steele Sizeris going to confirm it was indeed mycophenolate and not metoprolol].  I referred him to pulm rehabilitation but this was expensive.  He is opting to exercise by himself and continue weight loss with Ozempic.  Also because of his improvement he is asking if he can do a pulmonary function test.  His walking desaturation test is improved.  He also his pulmonary function test shows improvement although he still worse compared to June of  last year.  His last echocardiogram was in the summer 2023 and he still had low ejection fraction.  His BNP at that time was normal.  In terms of lung transplant Dr. Ovid Curd is told him it is too high risk given his medication side effects and heart failure and obesity.  At this point in time he is reconciled to the fact that he will not be a transplant candidate.  However he is overall pleased that he is improved.      SYMPTOM SCALE - ILD 02/03/2022 07/20/2022 Off esbreit and ofev  Current weight 231# 224# - ozempic  O2 use 4L [place] rest   Shortness of Breath 0 -> 5 scale with 5 being worst (score 6 If unable to do)   At rest 2 0  Simple tasks - showers, clothes change, eating, shaving 5 2  Household (dishes, doing bed, laundry) 5 4  Shopping 5 2  Walking level at own pace  4 3  Walking up Stairs 5 4  Total (30-36) Dyspnea Score 26 15      Non-dyspnea symptoms (0-> 5 scale) 02/03/2022 07/20/2022   How bad is your cough? 4 3  How bad is your fatigue 5 2  How bad is nausea 4 5  How bad is vomiting?  0 0  How bad is diarrhea? 0 1  How bad is anxiety? 5 1  How bad is depression 5 1  Any chronic pain - if so where and how bad 3 x     Simple office walk 185 feet x  3 laps goal with forehead probe 07/20/2022    O2 used ra   Number laps completed 3   Comments about pace avg   Resting Pulse Ox/HR 99% and 85/min   Final Pulse Ox/HR 93% and 100/min   Desaturated </= 88% no   Desaturated <= 3% points Yes 6    Got Tachycardic >/= 90/min yes   Symptoms at end of test Mild dyspea   Miscellaneous comments improved        PFT     Latest Ref Rng & Units 07/02/2022   11:35 AM 05/04/2022    3:32 PM 01/15/2022    3:58 PM 02/26/2021   12:00 PM  PFT Results  FVC-Pre L 2.20  1.87  1.91  2.44   FVC-Predicted Pre % 44  38  38  49   FVC-Post L   1.97  2.35   FVC-Predicted Post %   40  47   Pre FEV1/FVC % % 91  94  96  93   Post FEV1/FCV % %   90  91   FEV1-Pre L 2.01  1.77  1.83  2.27   FEV1-Predicted Pre % 54  47  49  60   FEV1-Post L   1.78  2.15   DLCO uncorrected ml/min/mmHg 12.27  11.35  14.05  16.66   DLCO UNC% % 43  40  49  58   DLCO corrected ml/min/mmHg 12.27  11.93  14.05  16.66   DLCO COR %Predicted % 43  42  49  58   DLVA Predicted % 86  92  105  101   TLC L   4.36  4.52   TLC % Predicted %   60  62   RV % Predicted %   60  66        has a past medical history of Arthritis, Bipolar disorder ([place]), GERD (gastroesophageal reflux disease), Hyperlipidemia, Hypertension,  and 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   reports that he quit smoking about 13 years ago. His smoking use included cigarettes. He started smoking about 41 years ago. He has a 28.00 pack-year smoking history. He has never used smokeless tobacco.  Past  Surgical History:  Procedure Laterality Date   ANKLE FRACTURE SURGERY Left    APPENDECTOMY     BIOPSY  11/18/2021   Procedure: BIOPSY;  Surgeon: Lavena Bullion, DO;  Location: [facility];  Service: Gastroenterology;;  EGD and COLON   CERVICAL FUSION     CHOLECYSTECTOMY     COLON SURGERY     COLONOSCOPY WITH PROPOFOL N/A 11/18/2021   Procedure: COLONOSCOPY WITH PROPOFOL;  Surgeon: Lavena Bullion, DO;  Location: [facility];  Service: Gastroenterology;  Laterality: N/A;   ESOPHAGOGASTRODUODENOSCOPY (EGD) WITH PROPOFOL N/A 11/18/2021   Procedure: ESOPHAGOGASTRODUODENOSCOPY (EGD) WITH PROPOFOL;  Surgeon: Lavena Bullion, DO;  Location: [facility];  Service: Gastroenterology;  Laterality: N/A;   HERNIA REPAIR     POLYPECTOMY  11/18/2021   Procedure: POLYPECTOMY;  Surgeon: Lavena Bullion, DO;  Location: [facility];  Service: Gastroenterology;;   RIGHT HEART CATH N/A 02/13/2022   Procedure: RIGHT HEART CATH;  Surgeon: Burnell Blanks, MD;  Location: [facility];  Service: Cardiovascular;  Laterality: N/A;    Allergies  Allergen Reactions   Fentanyl Other (See Comments)    Avoid opiates Other reaction(s): Other Avoid opiates   Other     04/06/18 Patient is a recovering narcotic abuser and, per wife, is NEVER to receive controlled substances (e.g. Narcotics, sedatives, etc.) Other reaction(s): Unknown 04/06/18 Patient is a recovering narcotic abuser and, per wife, is NEVER to receive controlled substances (e.g. Narcotics, sedatives, etc.)   Aspirin Nausea And Vomiting   Lipitor [Atorvastatin] Nausea And Vomiting    GI side effects.     Prednisone Other (See Comments)     Agitation Severe anger outbursts   Ofev [Nintedanib]     GI side effects   Pirfenidone    Contrast Media [Iodinated Contrast Media] Palpitations    Patient states he tolerates now   Nitroglycerin Nausea And Vomiting    Immunization History  Administered Date(s) Administered    Influenza Inj Mdck Quad Pf 06/18/2022   Influenza,inj,Quad PF,6+ Mos 07/16/2014, 07/21/2017, 08/22/2018, 06/13/2019, 06/10/2021   Influenza-Unspecified 07/16/2014, 06/14/2016   PFIZER Comirnaty(Gray Top)Covid-19 Tri-Sucrose Vaccine 06/18/2022   PFIZER(Purple Top)SARS-COV-2 Vaccination 11/21/2019, 12/24/2019, 06/23/2020   Td 04/15/2015   Td (Adult), 2 Lf Tetanus Toxid, Preservative Free 04/15/2015   Tdap 10/10/2009   Zoster Recombinat (Shingrix) 06/10/2021    Family History  Problem Relation Age of Onset   Cancer Mother        breast   Depression Mother    Hyperlipidemia Mother    Dementia Mother    Alcohol abuse Father    Cancer Father        skin   Hyperlipidemia Father    Alzheimer's disease Father    Cancer Maternal Grandfather    Cancer Paternal Grandmother    Pancreatic cancer Paternal Uncle    Colon cancer Neg Hx    Esophageal cancer Neg Hx    Stomach cancer Neg Hx      Current Outpatient Medications:    albuterol (VENTOLIN HFA) 108 (90 Base) MCG/ACT inhaler, Inhale 2 puffs into the lungs every 6 (six) hours as needed., Disp: 18 g, Rfl: 3   AMBULATORY NON FORMULARY MEDICATION, Continuous positive airway pressure (CPAP) machine set  at autotitration of H2O pressure, with all supplemental supplies as needed., Disp: 1 each, Rfl: 0   busPIRone (BUSPAR) 15 MG tablet, Take 1/3 tablet p.o. twice daily for 1 week, then take 2/3 tablet p.o. twice daily for 1 week, then take 1 tablet p.o. twice daily, Disp: 60 tablet, Rfl: 5   chlorthalidone (HYGROTON) 25 MG tablet, TAKE 1 TABLET BY MOUTH EVERY DAY, Disp: 90 tablet, Rfl: 1   colestipol (COLESTID) 1 g tablet, TAKE 1 TABLET BY MOUTH EVERY DAY, Disp: 90 tablet, Rfl: 1   dicyclomine (BENTYL) 20 MG tablet, TAKE 1 TABLET BY MOUTH THREE TIMES A DAY, Disp: 90 tablet, Rfl: 0   ENTRESTO 49-51 MG, Take 1 tablet by mouth 2 (two) times daily., Disp: , Rfl:    finasteride (PROSCAR) 5 MG tablet, TAKE 1 TABLET (5 MG TOTAL) BY MOUTH DAILY., Disp:  30 tablet, Rfl: 0   fluticasone (FLONASE) 50 MCG/ACT nasal spray, Place into both nostrils daily., Disp: , Rfl:    ibuprofen (ADVIL) 800 MG tablet, TAKE 1 TABLET BY MOUTH EVERY 8 HOURS AS NEEDED, Disp: 90 tablet, Rfl: 5   KLOR-CON M10 10 MEQ tablet, TAKE 1 TABLET BY MOUTH TWICE A DAY, Disp: 60 tablet, Rfl: 1   loratadine (CLARITIN) 10 MG tablet, Take 10 mg by mouth in the morning., Disp: , Rfl:    modafinil (PROVIGIL) 200 MG tablet, Take 1 tablet (200 mg total) by mouth daily., Disp: 30 tablet, Rfl: 5   ondansetron (ZOFRAN) 8 MG tablet, TAKE 1 TABLET BY MOUTH EVERY 8 HOURS AS NEEDED FOR NAUSEA FOR VOMITING, Disp: 90 tablet, Rfl: 2   pantoprazole (PROTONIX) 40 MG tablet, TAKE 1 TABLET BY MOUTH TWICE A DAY, Disp: 180 tablet, Rfl: 1   Semaglutide,0.25 or 0.'5MG'$ /DOS, (OZEMPIC, 0.25 OR 0.5 MG/DOSE,) 2 MG/1.5ML SOPN, Inject 0.5 mg into the skin once a week., Disp: , Rfl:    sertraline (ZOLOFT) 100 MG tablet, Take 2 tablets (200 mg total) by mouth daily., Disp: 180 tablet, Rfl: 1   sodium bicarbonate 650 MG tablet, TAKE ONE TABLET BY MOUTH 4 TIMES DAILY . (Patient taking differently: Take 650 mg by mouth 2 (two) times daily as needed for heartburn (reflux/indigestion.).), Disp: 120 tablet, Rfl: 5   triamcinolone (NASACORT) 55 MCG/ACT AERO nasal inhaler, Place 2 sprays into the nose daily as needed (allergies (when pollen is very high))., Disp: , Rfl:    metoprolol succinate (TOPROL-XL) 50 MG 24 hr tablet, Take 50 mg by mouth in the morning. (Patient not taking: Reported on 07/20/2022), Disp: , Rfl:   Current Facility-Administered Medications:    sodium chloride flush (NS) 0.9 % injection 3 mL, 3 mL, Intravenous, Q12H, Crenshaw, Denice Bors, MD      Objective:   Vitals:   07/20/22 0829  BP: 122/72  Pulse: 85  SpO2: 99%  Weight: 224 lb (101.6 kg)  Height: '5\' 11"'$  (1.803 m)    Estimated body mass index is 31.24 kg/m as calculated from the following:   Height as of this encounter: '5\' 11"'$  (1.803 m).    Weight as of this encounter: 224 lb (101.6 kg).  '@WEIGHTCHANGE'$ @  Filed Weights   07/20/22 0829  Weight: 224 lb (101.6 kg)     Physical ExamGeneral: No distress. Obese but lost weight Neuro: Alert and Oriented x 3. GCS 15. Speech normal Psych: Pleasant Resp:  Barrel Chest - no.  Wheeze - no, Crackles - yes mild base, No overt respiratory distress CVS: Normal heart sounds. Murmurs - no  Ext: Stigmata of Connective Tissue Disease - no HEENT: Normal upper airway. PEERL +. No post nasal drip        Assessment:       ICD-10-CM   1. ILD (interstitial lung disease) ([place])  J84.9 ECHOCARDIOGRAM COMPLETE    Pulmonary function test     Significant improvement in overall status particularly exercise hypoxemia and also subjective symptom questionnaire.  Pulmonary function test also improved but still worse compared to a year ago.  Part of this improvement can be because of weight loss.  Partly could also be if his ejection fraction has improved [at this point in time we do not know that].  Partly could also be spontaneous improvement in his ILD.  He he does not want to commit to therapy at this point in time and he has failed the 2 antifibrotic's.  Therefore we took a shared decision making to hold off on repeat CT scan of the chest which she brought up as an idea.  Instead of advised him to continue with his weight loss, continue exercise program.  I also emphasized the need to use oxygen to keep his pulse ox greater than 88% so he does not stress his heart.  He is agreed to do that.  We will get an echocardiogram to look at his current ejection fraction.  We will do repeat spirometry in few months.    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 PFT better since spring 2023 but still down compared to summer 2022 Plan --  continue o2 with exerttion   - increase as needed to to keep pulse ox > 88%  - important to protect your heart  - no need for o2 at rest - check echo next few weeks to see if EF improiveet --CMA to list esbriet allergy - shared decision making - not going to do cellcept as recommended by Dr Ovid Curd given improement   - please call and confirm this was the medicine -get RSV vaccine asap  Followup  -12 weeks do spiro/dlco  -12 weeks with DR Chase Caller or first available - 30 min visit  - can consider early HRCT depending on course  ( Level 05 visit: Estb 40-54 min  visit type: on-site physical face to visit  in total care time and counseling or/and coordination of care by this undersigned MD - Dr Brand Males. This includes one or more of the following on this same day 07/20/2022: pre-charting, chart review, note writing, documentation discussion of test results, diagnostic or treatment recommendations, prognosis, risks and benefits of management options, instructions, education, compliance or risk-factor reduction. It excludes time spent by the [place] or office staff in the care of the patient. Actual time 50 min)  SIGNATURE    Dr. Brand Males, M.D., F.C.C.P,  Pulmonary and Critical Care Medicine Staff Physician, [place] Director - Interstitial Lung Disease  Program  Pulmonary [facility] at [place], [place], [address]  Pager: [phone], If no answer or between  15:00h - 7:00h: call [phone] Telephone: [phone]  9:56 AM 07/20/2022

## 2022-07-20 NOTE — Patient Instructions (Addendum)
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 PFT better since spring 2023 but still down compared to summer 2022 Plan --  continue o2 with exerttion   - increase as needed to to keep pulse ox > 88%  - important to protect your heart  - no need for o2 at rest - check echo next few weeks to see if EF improiveet --CMA to list esbriet allergy - shared decision making - not going to do cellcept as recommended by Dr Ovid Curd given improement   - please call and confirm this was the medicine -get RSV vaccine asap  Followup  -12 weeks do spiro/dlco  -12 weeks with DR Chase Caller or first available - 30 min visit  - can consider early HRCT depending on course

## 2022-07-20 NOTE — Telephone Encounter (Deleted)
   Hi Jake [name] - the ILD specialist at [facility] recommended lopressor for him due to tachycardia with exertiona to help dyspnea. H ehas s-CHF as you know and is on entresto. Are you ok with it? Or maybe coreg. If so what dose  Thanks    SIGNATURE    Dr. Brand Males, M.D., F.C.C.P,  Pulmonary and Critical Care Medicine Staff Physician, [place] Director - Interstitial Lung Disease  Program  Medical Director - [facility] [place] at [place], [place], [address]   Pager: [phone], If no answer  -[place] or Try [phone] Telephone (clinical office): [phone] Telephone (research): [phone]  9:23 AM 07/20/2022

## 2022-07-20 NOTE — Telephone Encounter (Signed)
Never mind. He got his meds confused. Repl;y not needed

## 2022-07-28 DIAGNOSIS — J849 Interstitial pulmonary disease, unspecified: Secondary | ICD-10-CM | POA: Diagnosis not present

## 2022-07-29 ENCOUNTER — Encounter: Payer: Self-pay | Admitting: *Deleted

## 2022-07-29 ENCOUNTER — Other Ambulatory Visit: Payer: Self-pay | Admitting: *Deleted

## 2022-07-29 DIAGNOSIS — I700 Atherosclerosis of aorta: Secondary | ICD-10-CM

## 2022-08-05 ENCOUNTER — Ambulatory Visit (HOSPITAL_BASED_OUTPATIENT_CLINIC_OR_DEPARTMENT_OTHER)
Admission: RE | Admit: 2022-08-05 | Discharge: 2022-08-05 | Disposition: A | Discharge status: Home / Self Care | Payer: Medicare HMO | Source: Ambulatory Visit | Attending: Internal Medicine | Admitting: Internal Medicine

## 2022-08-05 DIAGNOSIS — I428 Other cardiomyopathies: Secondary | ICD-10-CM | POA: Diagnosis not present

## 2022-08-05 DIAGNOSIS — R0609 Other forms of dyspnea: Secondary | ICD-10-CM

## 2022-08-05 DIAGNOSIS — J849 Interstitial pulmonary disease, unspecified: Secondary | ICD-10-CM | POA: Diagnosis not present

## 2022-08-05 LAB — ECHOCARDIOGRAM COMPLETE
AR max vel: 2.34 cm2
AV Area VTI: 2.12 cm2
AV Area mean vel: 1.88 cm2
AV Mean grad: 3 mmHg
AV Peak grad: 5.8 mmHg
Ao pk vel: 1.2 m/s
Area-P 1/2: 4.01 cm2
Calc EF: 48.2 %
S' Lateral: 4.8 cm
Single Plane A2C EF: 56.4 %
Single Plane A4C EF: 42.3 %

## 2022-08-05 MED ORDER — PERFLUTREN LIPID MICROSPHERE
1.0000 mL | INTRAVENOUS | Status: AC | PRN
  Administered 2022-08-05: 3 mL via INTRAVENOUS

## 2022-08-08 ENCOUNTER — Other Ambulatory Visit: Payer: Self-pay | Admitting: Psychiatry

## 2022-08-08 ENCOUNTER — Other Ambulatory Visit: Payer: Self-pay | Admitting: Physician Assistant

## 2022-08-08 DIAGNOSIS — F39 Unspecified mood [affective] disorder: Secondary | ICD-10-CM

## 2022-08-08 DIAGNOSIS — F411 Generalized anxiety disorder: Secondary | ICD-10-CM

## 2022-08-08 DIAGNOSIS — N401 Enlarged prostate with lower urinary tract symptoms: Secondary | ICD-10-CM

## 2022-08-08 DIAGNOSIS — I5022 Chronic systolic (congestive) heart failure: Secondary | ICD-10-CM

## 2022-08-08 DIAGNOSIS — J849 Interstitial pulmonary disease, unspecified: Secondary | ICD-10-CM

## 2022-08-08 DIAGNOSIS — J9611 Chronic respiratory failure with hypoxia: Secondary | ICD-10-CM

## 2022-08-09 ENCOUNTER — Other Ambulatory Visit: Payer: Self-pay | Admitting: Physician Assistant

## 2022-08-09 DIAGNOSIS — K580 Irritable bowel syndrome with diarrhea: Secondary | ICD-10-CM

## 2022-08-12 ENCOUNTER — Other Ambulatory Visit: Payer: Self-pay | Admitting: Family Medicine

## 2022-08-15 ENCOUNTER — Other Ambulatory Visit: Payer: Self-pay | Admitting: Physician Assistant

## 2022-08-15 DIAGNOSIS — R0602 Shortness of breath: Secondary | ICD-10-CM

## 2022-08-15 DIAGNOSIS — J984 Other disorders of lung: Secondary | ICD-10-CM

## 2022-08-15 DIAGNOSIS — R0902 Hypoxemia: Secondary | ICD-10-CM

## 2022-08-17 ENCOUNTER — Encounter: Payer: Self-pay | Admitting: Physician Assistant

## 2022-08-17 ENCOUNTER — Ambulatory Visit: Payer: Medicare HMO | Admitting: Physician Assistant

## 2022-08-17 ENCOUNTER — Ambulatory Visit (INDEPENDENT_AMBULATORY_CARE_PROVIDER_SITE_OTHER): Payer: Medicare HMO | Admitting: Physician Assistant

## 2022-08-17 VITALS — BP 101/72 | HR 79 | Temp 98.3°F | Ht 71.0 in | Wt 229.0 lb

## 2022-08-17 DIAGNOSIS — J40 Bronchitis, not specified as acute or chronic: Secondary | ICD-10-CM

## 2022-08-17 DIAGNOSIS — J849 Interstitial pulmonary disease, unspecified: Secondary | ICD-10-CM | POA: Diagnosis not present

## 2022-08-17 DIAGNOSIS — J329 Chronic sinusitis, unspecified: Secondary | ICD-10-CM | POA: Diagnosis not present

## 2022-08-17 MED ORDER — METHYLPREDNISOLONE SODIUM SUCC 125 MG IJ SOLR
125.0000 mg | Freq: Once | INTRAMUSCULAR | Status: AC
  Administered 2022-08-17: 125 mg via INTRAMUSCULAR

## 2022-08-17 NOTE — Progress Notes (Signed)
Acute Office Visit  Subjective:     Patient ID: Jake [name], male    DOB: 1960/12/28, 61 y.o.   MRN: [id number]  No chief complaint on file.   HPI Patient is in today for cough, congestion, productive cough, sinus drainage, sinus congestion for the past 7 days. He has ILD. Last flare was 06/26/2022. No fever, chills, body aches. He lives with grandchildren who are always sick. Solumedrol helps the most when he gets this way. Taking OTC tylenol cold sinus severe with some relief. No SOB.   [name] Kitchen. 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[place]) 05/27/2017   History of pancreatitis 05/27/2017   Diverticulitis 05/27/2017   Elevated fasting glucose 06/10/2017   Low testosterone 06/10/2017   Benign prostatic hyperplasia (BPH) with urinary urgency 06/12/2017   No energy 06/12/2017   Former smoker 07/25/2017   Cough 07/25/2017   Low testosterone in male 07/25/2017   Non-restorative sleep 07/25/2017   Anxiety 10/30/2015   Chronic nausea 10/30/2015   Major depressive disorder 10/30/2015   Migraine 10/30/2015   Neck pain 10/30/2015   Occipital neuralgia 10/30/2015   Substance abuse in remission ([place]) 04/07/2018   Bipolar disorder ([place]) 07/04/2018   Hearing difficulty of right ear 01/16/2019   IFG (impaired fasting glucose) 03/20/2019   Coronary artery disease due to calcified coronary lesion 11/03/2019   Thoracic aorta atherosclerosis ([place]) 11/03/2019   Aortic atherosclerosis ([place]) 11/03/2019   Hepatic steatosis  11/03/2019   SOB (shortness of breath) on exertion 11/06/2019   Precordial pain 11/06/2019   Essential hypertension 11/06/2019   Obesity (BMI 30-39.9) 11/06/2019   Aneurysm of ascending aorta ([place]) 11/10/2019   Hypoxia 02/21/2020   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/2020   Sinobronchitis 07/10/2020   Decreased cardiac ejection fraction 11/14/2020   Cardiomyopathy ([place]) 11/14/2020   Dyspnea 11/14/2020   Left lower quadrant abdominal pain 17/40/8144   Chronic systolic congestive heart failure, NYHA class 3 ([place]) 05/30/2021   IPF (idiopathic pulmonary fibrosis) ([place]) 06/10/2021   Pre-diabetes 06/10/2021   Class 1 obesity due to excess calories with serious comorbidity and body mass index (BMI) of 31.0 to 31.9 in adult 06/10/2021   Cirrhosis of liver without ascites ([place]) 06/15/2021   Dysfunction of both eustachian tubes 07/08/2021   Eye discharge 08/11/2021   Photophobia of both eyes 08/11/2021   Eye pain, bilateral 08/11/2021   Diarrhea 09/05/2021   Abdominal distention 09/05/2021   Generalized abdominal pain 09/05/2021   Suspicious nevus 11/11/2021   Change in bowel habits    Gastritis and gastroduodenitis    Colon cancer screening    Hematochezia    Grade II internal hemorrhoids    Adenomatous polyp of sigmoid colon    Rotator cuff tendonitis, left 12/24/2021   Sciatica of left side 12/24/2021   ILD (interstitial lung disease) ([place]) 02/09/2022   Chronic respiratory failure with hypoxia ([place]) 02/10/2022   SOB (shortness of breath) 02/10/2022   Resolved Ambulatory Problems    Diagnosis Date Noted   Essential hypertension, benign 05/19/2016   Community acquired  pneumonia of left lower lobe of lung 11/20/2016   Impacted cerumen of right ear 01/16/2019   Past Medical History:  Diagnosis Date   Arthritis    Hypertension      ROS  See HPI.     Objective:    There were no vitals taken for this visit. BP Readings from Last 3 Encounters:  07/20/22 122/72  07/06/22  136/77  06/26/22 133/71   Wt Readings from Last 3 Encounters:  07/20/22 224 lb (101.6 kg)  07/06/22 225 lb 12.8 oz (102.4 kg)  06/26/22 226 lb (102.5 kg)      Physical Exam Vitals reviewed.  Constitutional:      Appearance: Normal appearance. He is obese.  HENT:     Head: Normocephalic.     Right Ear: Tympanic membrane, ear canal and external ear normal. There is no impacted cerumen.     Left Ear: Tympanic membrane, ear canal and external ear normal. There is no impacted cerumen.     Nose: Congestion present.     Mouth/Throat:     Mouth: Mucous membranes are moist.     Pharynx: No oropharyngeal exudate or posterior oropharyngeal erythema.  Eyes:     Conjunctiva/sclera: Conjunctivae normal.  Neck:     Vascular: No carotid bruit.  Cardiovascular:     Rate and Rhythm: Normal rate and regular rhythm.     Pulses: Normal pulses.  Pulmonary:     Effort: Pulmonary effort is normal.     Breath sounds: Normal breath sounds.  Musculoskeletal:     Cervical back: Normal range of motion and neck supple. No rigidity or tenderness.     Right lower leg: No edema.     Left lower leg: No edema.  Lymphadenopathy:     Cervical: No cervical adenopathy.  Neurological:     General: No focal deficit present.     Mental Status: He is alert and oriented to person, place, and time.  Psychiatric:        Mood and Affect: Mood normal.           Assessment & Plan:  [name] KitchenMarland KitchenSayvion was seen today for nasal congestion.  Diagnoses and all orders for this visit:  Sinobronchitis -     methylPREDNISolone sodium succinate (SOLU-MEDROL) 125 mg/2 mL injection 125 mg  ILD (interstitial lung disease) (HCC)   No signs of bacterial infection Solumedrol '125mg'$  IM today Continue symptomatic care Consider vitamin C '1000mg'$  and zinc '50mg'$  daily for immune support  [name], PA-C

## 2022-08-18 MED ORDER — DOXYCYCLINE HYCLATE 100 MG PO TABS: 100.0000 mg | ORAL_TABLET | Freq: Two times a day (BID) | ORAL | 0 refills | Status: DC

## 2022-08-18 NOTE — Telephone Encounter (Signed)
Pt continues to have symptoms that are worsening. Will add antibiotic.

## 2022-08-20 ENCOUNTER — Encounter: Payer: Self-pay | Admitting: Psychiatry

## 2022-08-20 ENCOUNTER — Telehealth: Payer: Medicare HMO | Admitting: Psychiatry

## 2022-08-20 ENCOUNTER — Telehealth (INDEPENDENT_AMBULATORY_CARE_PROVIDER_SITE_OTHER): Payer: Medicare HMO | Admitting: Psychiatry

## 2022-08-20 VITALS — BP 101/70 | Wt 225.0 lb

## 2022-08-20 DIAGNOSIS — G4733 Obstructive sleep apnea (adult) (pediatric): Secondary | ICD-10-CM

## 2022-08-20 DIAGNOSIS — F39 Unspecified mood [affective] disorder: Secondary | ICD-10-CM

## 2022-08-20 DIAGNOSIS — F411 Generalized anxiety disorder: Secondary | ICD-10-CM

## 2022-08-20 DIAGNOSIS — R69 Illness, unspecified: Secondary | ICD-10-CM | POA: Diagnosis not present

## 2022-08-20 MED ORDER — BUSPIRONE HCL 15 MG PO TABS: 15.0000 mg | ORAL_TABLET | Freq: Two times a day (BID) | ORAL | 1 refills | Status: DC

## 2022-08-20 MED ORDER — SERTRALINE HCL 100 MG PO TABS: 200.0000 mg | ORAL_TABLET | Freq: Every day | ORAL | 1 refills | Status: DC

## 2022-08-20 NOTE — Progress Notes (Signed)
Jake [name] [id number] September 27, 1960 61 y.o.  Virtual Visit via Video Note  I connected with pt @ on 08/21/22 at 10:30 AM EST by a video enabled telemedicine application and verified that I am speaking with the correct person using two identifiers.   I discussed the limitations of evaluation and management by telemedicine and the availability of in person appointments. The patient expressed understanding and agreed to proceed.  I discussed the assessment and treatment plan with the patient. The patient was provided an opportunity to ask questions and all were answered. The patient agreed with the plan and demonstrated an understanding of the instructions.   The patient was advised to call back or seek an in-person evaluation if the symptoms worsen or if the condition fails to improve as anticipated.  I provided 20 minutes of non-face-to-face time during this encounter.  The patient was located at home.  The provider was located at [place].   Thayer Headings, PMHNP   Subjective:   Patient ID:  Jake [name] is a 61 y.o. (DOB 1961/06/26) male.  Chief Complaint:  Chief Complaint  Patient presents with   Follow-up    Anxiety, depression    HPI Jake [name] presents for follow-up of anxiety and depression. He reports, "everything is going ok." He reports that his health is "improving." He reports that he wears oxygen when he is doing activities that exert himself.   He reports his anxiety is "much better." He reports that he has occ panic s/s "but nothing like it was." He reports that worry has been manageable. He reports some mild depression at baseline "but not as debilitating as it has been in the past." Denies anhedonia. Enjoys his family, church, and friends. He reports "some" irritability. He reports that his patience is ok. He reports that his energy is "lacking" with cold for the last 10 days. He is able to do things he needs to do around the house. Motivation is  fair. He has less drive for activities outside the house. He reports that there are things that he could volunteer for at church and has not been. He goes to the grocery store a few times a week. He reports that he enjoys "being relaxed, quiet, and calm." He reports that grandchildren wake him up around 6 am. He falls asleep 11 pm-1 am. Occ difficulty falling asleep and tends to stay asleep other than to urinate. Appetite has been "fine." Denies SI.   He had to come off Ozempic due to GI side effects.   Past Psychiatric Medication Trials: Latuda- most effective and had jaw clinching/thrusting. Olanzapine Risperdal Seroquel Abilify Lithium Carbamazepine Topamax Effexor Sertraline Wellbutrin- not effective  Review of Systems:  Review of Systems  Respiratory:         Occ SOB  Cardiovascular:  Negative for chest pain and leg swelling.  Musculoskeletal:  Negative for gait problem.  Psychiatric/Behavioral:         Please refer to HPI    Medications: I have reviewed the patient's current medications.  Current Outpatient Medications  Medication Sig Dispense Refill   albuterol (VENTOLIN HFA) 108 (90 Base) MCG/ACT inhaler TAKE 2 PUFFS BY MOUTH EVERY 6 HOURS AS NEEDED 18 each 3   AMBULATORY NON FORMULARY MEDICATION Continuous positive airway pressure (CPAP) machine set at autotitration of H2O pressure, with all supplemental supplies as needed. 1 each 0   chlorthalidone (HYGROTON) 25 MG tablet TAKE 1 TABLET BY MOUTH EVERY DAY 90 tablet 1   colestipol (COLESTID) 1  g tablet TAKE 1 TABLET BY MOUTH EVERY DAY 90 tablet 1   dicyclomine (BENTYL) 20 MG tablet TAKE 1 TABLET BY MOUTH THREE TIMES A DAY 90 tablet 0   doxycycline (VIBRA-TABS) 100 MG tablet Take 1 tablet (100 mg total) by mouth 2 (two) times daily. 20 tablet 0   ENTRESTO 49-51 MG Take 1 tablet by mouth 2 (two) times daily.     finasteride (PROSCAR) 5 MG tablet TAKE 1 TABLET (5 MG TOTAL) BY MOUTH DAILY. 30 tablet 5   fluticasone  (FLONASE) 50 MCG/ACT nasal spray Place into both nostrils daily.     ibuprofen (ADVIL) 800 MG tablet TAKE 1 TABLET BY MOUTH EVERY 8 HOURS AS NEEDED 90 tablet 5   KLOR-CON M10 10 MEQ tablet TAKE 1 TABLET BY MOUTH TWICE A DAY 60 tablet 1   loratadine (CLARITIN) 10 MG tablet Take 10 mg by mouth in the morning.     metoprolol succinate (TOPROL-XL) 50 MG 24 hr tablet Take 50 mg by mouth in the morning.     modafinil (PROVIGIL) 200 MG tablet Take 1 tablet (200 mg total) by mouth daily. 30 tablet 5   ondansetron (ZOFRAN) 8 MG tablet TAKE 1 TABLET BY MOUTH EVERY 8 HOURS AS NEEDED FOR NAUSEA FOR VOMITING 90 tablet 2   pantoprazole (PROTONIX) 40 MG tablet TAKE 1 TABLET BY MOUTH TWICE A DAY 180 tablet 1   sodium bicarbonate 650 MG tablet TAKE ONE TABLET BY MOUTH 4 TIMES DAILY 120 tablet 5   triamcinolone (NASACORT) 55 MCG/ACT AERO nasal inhaler Place 2 sprays into the nose daily as needed (allergies (when pollen is very high)).     busPIRone (BUSPAR) 15 MG tablet Take 1 tablet (15 mg total) by mouth 2 (two) times daily. 180 tablet 1   Semaglutide,0.25 or 0.'5MG'$ /DOS, (OZEMPIC, 0.25 OR 0.5 MG/DOSE,) 2 MG/1.5ML SOPN Inject 0.5 mg into the skin once a week. (Patient not taking: Reported on 08/20/2022)     sertraline (ZOLOFT) 100 MG tablet Take 2 tablets (200 mg total) by mouth daily. 180 tablet 1   Current Facility-Administered Medications  Medication Dose Route Frequency Provider Last Rate Last Admin   sodium chloride flush (NS) 0.9 % injection 3 mL  3 mL Intravenous Q12H Stanford Breed Denice Bors, MD        Medication Side Effects: None  Allergies:  Allergies  Allergen Reactions   Fentanyl Other (See Comments)    Avoid opiates Other reaction(s): Other Avoid opiates   Other     04/06/18 Patient is a recovering narcotic abuser and, per wife, is NEVER to receive controlled substances (e.g. Narcotics, sedatives, etc.) Other reaction(s): Unknown 04/06/18 Patient is a recovering narcotic abuser and, per wife, is  NEVER to receive controlled substances (e.g. Narcotics, sedatives, etc.)   Aspirin Nausea And Vomiting   Lipitor [Atorvastatin] Nausea And Vomiting    GI side effects.     Prednisone Other (See Comments)     Agitation Severe anger outbursts   Ofev [Nintedanib]     GI side effects   Pirfenidone    Contrast Media [Iodinated Contrast Media] Palpitations    Patient states he tolerates now   Nitroglycerin Nausea And Vomiting    Past Medical History:  Diagnosis Date   Arthritis    lower back   Bipolar disorder ([place])    GERD (gastroesophageal reflux disease)    Hyperlipidemia    Hypertension    Substance abuse in remission ([place]) 04/06/2018   AVOID OPIATES!  Family History  Problem Relation Age of Onset   Cancer Mother        breast   Depression Mother    Hyperlipidemia Mother    Dementia Mother    Alcohol abuse Father    Cancer Father        skin   Hyperlipidemia Father    Alzheimer's disease Father    Cancer Maternal Grandfather    Cancer Paternal Grandmother    Pancreatic cancer Paternal Uncle    Colon cancer Neg Hx    Esophageal cancer Neg Hx    Stomach cancer Neg Hx     Social History   Socioeconomic History   Marital status: Married    Spouse name: Dawn   Number of children: 1   Years of education: 14   Highest education level: Some college, no degree  Occupational History   Occupation: Disabled  Tobacco Use   Smoking status: Former    Packs/day: 1.00    Years: 28.00    Total pack years: 28.00    Types: Cigarettes    Start date: 1982    Quit date: 2010    Years since quitting: 13.9   Smokeless tobacco: Never  Vaping Use   Vaping Use: Never used  Substance and Sexual Activity   Alcohol use: No   Drug use: No    Comment: 04/06/2018 Patient's wife wanted [place] to be very aware that her husband is a recovering narcotic abuser   Sexual activity: Yes  Other Topics Concern   Not on file  Social History Narrative   Lives with his wife, daughter,  son-in-law and 2 two grandchildren. Likes to spend to time with grandchildren. Does not exercise due to back issues in the past and respiratory problems.   Social Determinants of Health   Financial Resource Strain: Low Risk  (02/04/2022)   Overall Financial Resource Strain (CARDIA)    Difficulty of Paying Living Expenses: Not hard at all  Food Insecurity: No Food Insecurity (02/04/2022)   Hunger Vital Sign    Worried About Running Out of Food in the Last Year: Never true    Ran Out of Food in the Last Year: Never true  Transportation Needs: No Transportation Needs (02/04/2022)   PRAPARE - Hydrologist (Medical): No    Lack of Transportation (Non-Medical): No  Physical Activity: Inactive (02/04/2022)   Exercise Vital Sign    Days of Exercise per Week: 0 days    Minutes of Exercise per Session: 0 min  Stress: No Stress Concern Present (02/04/2022)   [place]    Feeling of Stress : Not at all  Social Connections: Moderately Integrated (02/04/2022)   Social Connection and Isolation Panel [NHANES]    Frequency of Communication with Friends and Family: Once a week    Frequency of Social Gatherings with Friends and Family: More than three times a week    Attends Religious Services: More than 4 times per year    Active Member of [employer] or Organizations: No    Attends Archivist Meetings: Never    Marital Status: Married  Human resources officer Violence: Not At Risk (02/04/2022)   Humiliation, Afraid, Rape, and Kick questionnaire    Fear of Current or Ex-Partner: No    Emotionally Abused: No    Physically Abused: No    Sexually Abused: No    Past Medical History, Surgical history, Social history, and Family history were  reviewed and updated as appropriate.   Please see review of systems for further details on the patient's review from today.   Objective:   Physical Exam:  BP 101/70   Wt  225 lb (102.1 kg)   BMI 31.38 kg/m   Physical Exam Neurological:     Mental Status: He is alert and oriented to person, place, and time.     Cranial Nerves: No dysarthria.  Psychiatric:        Attention and Perception: Attention and perception normal.        Mood and Affect: Mood normal.        Speech: Speech normal.        Behavior: Behavior is cooperative.        Thought Content: Thought content normal. Thought content is not paranoid or delusional. Thought content does not include homicidal or suicidal ideation. Thought content does not include homicidal or suicidal plan.        Cognition and Memory: Cognition and memory normal.        Judgment: Judgment normal.     Comments: Insight intact     Lab Review:     Component Value Date/Time   NA 133 (L) 07/02/2022 1139   K 3.5 07/02/2022 1139   CL 91 (L) 07/02/2022 1139   CO2 33 (H) 07/02/2022 1139   GLUCOSE 102 (H) 07/02/2022 1139   BUN 31 (H) 07/02/2022 1139   CREATININE 1.24 07/02/2022 1139   CREATININE 1.21 09/12/2021 0000   CALCIUM 10.3 07/02/2022 1139   PROT 7.8 07/02/2022 1139   ALBUMIN 5.1 07/02/2022 1139   AST 18 07/02/2022 1139   ALT 22 07/02/2022 1139   ALKPHOS 44 07/02/2022 1139   BILITOT 0.5 07/02/2022 1139   GFRNONAA >60 09/03/2021 1854   GFRNONAA 60 03/20/2020 1044   GFRAA 69 03/20/2020 1044       Component Value Date/Time   WBC 9.1 07/02/2022 1139   RBC 5.14 07/02/2022 1139   HGB 14.4 07/02/2022 1139   HCT 42.9 07/02/2022 1139   PLT 383.0 07/02/2022 1139   MCV 83.5 07/02/2022 1139   MCH 28.5 09/03/2021 1854   MCHC 33.5 07/02/2022 1139   RDW 14.8 07/02/2022 1139   LYMPHSABS 1.9 07/02/2022 1139   MONOABS 0.9 07/02/2022 1139   EOSABS 0.1 07/02/2022 1139   BASOSABS 0.0 07/02/2022 1139    No results found for: "POCLITH", "LITHIUM"   No results found for: "PHENYTOIN", "PHENOBARB", "VALPROATE", "CBMZ"   .res Assessment: Plan:   Will continue current plan of care since target signs and symptoms  are well controlled without any tolerability issues. Continue Sertraline 200 mg po qd for anxiety and depression.  Continue Buspar 15 mg po BID for anxiety.  Continue Modafinil 200 mg po qd for excessive daytime somnolence secondary to sleep apnea.   Jake [name] was seen today for follow-up.  Diagnoses and all orders for this visit:  Generalized anxiety disorder -     sertraline (ZOLOFT) 100 MG tablet; Take 2 tablets (200 mg total) by mouth daily. -     busPIRone (BUSPAR) 15 MG tablet; Take 1 tablet (15 mg total) by mouth 2 (two) times daily.  Episodic mood disorder (HCC) -     sertraline (ZOLOFT) 100 MG tablet; Take 2 tablets (200 mg total) by mouth daily.  OSA on CPAP     Please see After Visit Summary for patient specific instructions.  Future Appointments  Date Time Provider [place]  10/12/2022 11:00 AM [facility]-PFT [facility]UL[facility]  None  02/10/2023 10:00 AM [facility]-NURSE HEALTH ADVISOR [facility]PC[facility] None    No orders of the defined types were placed in this encounter.     -------------------------------

## 2022-08-27 DIAGNOSIS — J849 Interstitial pulmonary disease, unspecified: Secondary | ICD-10-CM | POA: Diagnosis not present

## 2022-08-28 ENCOUNTER — Telehealth (HOSPITAL_BASED_OUTPATIENT_CLINIC_OR_DEPARTMENT_OTHER): Payer: Self-pay

## 2022-08-28 ENCOUNTER — Telehealth: Payer: Self-pay | Admitting: Psychiatry

## 2022-08-28 DIAGNOSIS — F411 Generalized anxiety disorder: Secondary | ICD-10-CM

## 2022-08-28 NOTE — Telephone Encounter (Signed)
Pt called and he would like to  go down on his buspar or wean off the medicine. Please give him a call at [phone]

## 2022-08-28 NOTE — Telephone Encounter (Signed)
Patient said his wife and daughter have noted a marked increase in his aggression since being on Buspar 15 mg BID. He said he does not notice it, but he trusts their opinion. He would like to stop it.

## 2022-09-01 ENCOUNTER — Telehealth: Payer: Self-pay

## 2022-09-01 NOTE — Telephone Encounter (Signed)
Patient called with symptoms of diarrhea. Denies chronic diarrhea, recent change in diet,  recent travel to a foreign country, fever unresponsive to fever-reducing measures, new medications or blood in stool.   He states his grand kids and son-in-law also has diarrhea.    Advised of clear liquid diet first 12 to 24 hours. Sips of water, flat soda, clear broth, gelatin (not red) or flavored ice.   Next 12 hours, progress to eating soups (avoiding cream soups), dry toast, soda crackers, white rice, pretzels, bananas, applesauce and potatoes.  Progress to a regular diet after soft formed stools occur.    Avoid dairy products, citrus juices, raw fruits and vegetables, and fried or spicy foods for 2 to 5 days after diarrhea subsides.   (Imodium, Kaopectate, Pepto-Bismol) can be used. Follow the instructions on the label.   Advised patient to call back to schedule an appointment if doesn't resolve.

## 2022-09-03 MED ORDER — BUSPIRONE HCL 15 MG PO TABS: ORAL_TABLET | ORAL | 1 refills | Status: DC

## 2022-09-03 NOTE — Telephone Encounter (Signed)
Patient notified

## 2022-09-03 NOTE — Telephone Encounter (Signed)
He can reduce it to 1/2 tablet twice daily for 5 days, and then stop.

## 2022-09-06 ENCOUNTER — Other Ambulatory Visit: Payer: Self-pay | Admitting: Physician Assistant

## 2022-09-06 DIAGNOSIS — I10 Essential (primary) hypertension: Secondary | ICD-10-CM

## 2022-09-07 ENCOUNTER — Other Ambulatory Visit: Payer: Self-pay | Admitting: Physician Assistant

## 2022-09-07 DIAGNOSIS — K580 Irritable bowel syndrome with diarrhea: Secondary | ICD-10-CM

## 2022-09-17 ENCOUNTER — Other Ambulatory Visit: Payer: Self-pay | Admitting: Physician Assistant

## 2022-09-17 DIAGNOSIS — R110 Nausea: Secondary | ICD-10-CM

## 2022-09-18 ENCOUNTER — Other Ambulatory Visit: Payer: Self-pay | Admitting: Physician Assistant

## 2022-09-18 DIAGNOSIS — K219 Gastro-esophageal reflux disease without esophagitis: Secondary | ICD-10-CM

## 2022-09-21 ENCOUNTER — Ambulatory Visit (HOSPITAL_BASED_OUTPATIENT_CLINIC_OR_DEPARTMENT_OTHER)
Admission: RE | Admit: 2022-09-21 | Discharge: 2022-09-21 | Disposition: A | Discharge status: Home / Self Care | Payer: Medicare HMO | Source: Ambulatory Visit | Attending: Cardiology | Admitting: Cardiology

## 2022-09-21 ENCOUNTER — Encounter (HOSPITAL_BASED_OUTPATIENT_CLINIC_OR_DEPARTMENT_OTHER): Payer: Self-pay

## 2022-09-21 DIAGNOSIS — J189 Pneumonia, unspecified organism: Secondary | ICD-10-CM | POA: Diagnosis not present

## 2022-09-21 DIAGNOSIS — J9811 Atelectasis: Secondary | ICD-10-CM | POA: Diagnosis not present

## 2022-09-21 DIAGNOSIS — I700 Atherosclerosis of aorta: Secondary | ICD-10-CM

## 2022-09-21 MED ORDER — IOHEXOL 350 MG/ML SOLN
100.0000 mL | Freq: Once | INTRAVENOUS | Status: AC | PRN
  Administered 2022-09-21: 100 mL via INTRAVENOUS

## 2022-09-22 DIAGNOSIS — G4733 Obstructive sleep apnea (adult) (pediatric): Secondary | ICD-10-CM | POA: Diagnosis not present

## 2022-09-27 DIAGNOSIS — J849 Interstitial pulmonary disease, unspecified: Secondary | ICD-10-CM | POA: Diagnosis not present

## 2022-10-07 ENCOUNTER — Other Ambulatory Visit: Payer: Self-pay | Admitting: Physician Assistant

## 2022-10-07 DIAGNOSIS — K580 Irritable bowel syndrome with diarrhea: Secondary | ICD-10-CM

## 2022-10-08 ENCOUNTER — Ambulatory Visit: Payer: Medicare HMO | Admitting: Internal Medicine

## 2022-10-09 ENCOUNTER — Other Ambulatory Visit: Payer: Self-pay | Admitting: Cardiology

## 2022-10-14 ENCOUNTER — Ambulatory Visit (INDEPENDENT_AMBULATORY_CARE_PROVIDER_SITE_OTHER): Payer: Medicare HMO | Admitting: Physician Assistant

## 2022-10-14 ENCOUNTER — Encounter: Payer: Self-pay | Admitting: Physician Assistant

## 2022-10-14 ENCOUNTER — Telehealth: Payer: Self-pay

## 2022-10-14 VITALS — BP 113/67 | HR 75 | Ht 71.0 in | Wt 230.0 lb

## 2022-10-14 DIAGNOSIS — J329 Chronic sinusitis, unspecified: Secondary | ICD-10-CM | POA: Diagnosis not present

## 2022-10-14 DIAGNOSIS — J40 Bronchitis, not specified as acute or chronic: Secondary | ICD-10-CM | POA: Diagnosis not present

## 2022-10-14 DIAGNOSIS — B349 Viral infection, unspecified: Secondary | ICD-10-CM

## 2022-10-14 DIAGNOSIS — J849 Interstitial pulmonary disease, unspecified: Secondary | ICD-10-CM

## 2022-10-14 DIAGNOSIS — J9611 Chronic respiratory failure with hypoxia: Secondary | ICD-10-CM

## 2022-10-14 MED ORDER — METHYLPREDNISOLONE SODIUM SUCC 125 MG IJ SOLR: 125.0000 mg | Freq: Once | INTRAMUSCULAR | Status: DC

## 2022-10-14 MED ORDER — PULSE OXIMETER MISC: 0 refills | Status: DC

## 2022-10-14 MED ORDER — DOXYCYCLINE HYCLATE 100 MG PO TABS: 100.0000 mg | ORAL_TABLET | Freq: Two times a day (BID) | ORAL | 0 refills | Status: DC

## 2022-10-14 NOTE — Patient Instructions (Signed)

## 2022-10-14 NOTE — Telephone Encounter (Signed)
Pulse ox prescription.

## 2022-10-14 NOTE — Progress Notes (Signed)
Acute Office Visit  Subjective:     Patient ID: Jake [name], male    DOB: 05-30-1961, 62 y.o.   MRN: [id number]  Chief Complaint  Patient presents with   Cough   Nasal Congestion    Cough Associated symptoms include a sore throat and shortness of breath. Pertinent negatives include no chest pain, chills, ear pain, fever, hemoptysis or wheezing.   Patient is 62 year old male with CC of "cough, congestion, shortness of breath. Pt has PMH significant for Idiopathic Pulmonary Fibrosis. He states 2 weeks ago he developed cough and congestion that has progressively worsened. 2-3 days ago he has now developed shortness of breath with exertion. Today he states he is now having shortness of breath at rest with a home SpO2 reading of mid 70's. He is 93% in office on RA. He has tried intranasal steroid, allergy medication, and supplemental home oxygen without relief. Pt lives with grandchildren who are 73 and 43 years of age who were sick with similar viral URI symptoms 2-3 weeks ago. He endorses cough, congestion, shortness of breath. He denies fever, chest palpitations, chest pain, edema.  Review of Systems  Constitutional:  Negative for chills and fever.  HENT:  Positive for congestion and sore throat. Negative for ear pain.   Respiratory:  Positive for cough, sputum production and shortness of breath. Negative for hemoptysis and wheezing.   Cardiovascular:  Negative for chest pain, palpitations and leg swelling.  Gastrointestinal: Negative.   Neurological: Negative.         Objective:    BP 113/67   Pulse 75   Ht '5\' 11"'$  (1.803 m)   Wt 230 lb (104.3 kg)   SpO2 93%   BMI 32.08 kg/m  BP Readings from Last 3 Encounters:  10/14/22 113/67  08/17/22 101/72  07/20/22 122/72   Wt Readings from Last 3 Encounters:  10/14/22 230 lb (104.3 kg)  08/17/22 229 lb (103.9 kg)  07/20/22 224 lb (101.6 kg)      Physical Exam Constitutional:      General: He is not in acute distress.     Appearance: Normal appearance.  HENT:     Right Ear: Tympanic membrane normal.     Left Ear: Tympanic membrane normal.     Nose: No congestion or rhinorrhea.     Mouth/Throat:     Mouth: Mucous membranes are moist.  Neck:     Comments: No lymphadenopathy Cardiovascular:     Rate and Rhythm: Normal rate and regular rhythm.     Heart sounds: Normal heart sounds. No murmur heard.    No gallop.  Pulmonary:     Breath sounds: No wheezing, rhonchi or rales.     Comments: Diminished shallow breath sounds in all lung fields Musculoskeletal:     Cervical back: Neck supple.  Skin:    Capillary Refill: Capillary refill takes less than 2 seconds.  Neurological:     Mental Status: He is alert and oriented to person, place, and time.  Psychiatric:        Mood and Affect: Mood normal.        Behavior: Behavior normal.          Assessment & Plan:   Jake [name] was seen today for cough and nasal congestion.  Diagnoses and all orders for this visit:  Sinobronchitis -     methylPREDNISolone sodium succinate (SOLU-MEDROL) 125 mg/2 mL injection 125 mg -     doxycycline (VIBRA-TABS) 100 MG tablet; Take 1  tablet (100 mg total) by mouth 2 (two) times daily.  Viral syndrome   Likely viral etiology Solu-Medrol 125 mg injection done in office to reduce airway inflammation, pt tolerated injection with no issues. Pt has had significant relief of shortness of breath in past with steroid injection, he does not tolerate oral steroids well. Start doxycycline due length of current infection as well as being high risk for secondary bacterial infection Continue OTC intranasal steroids and allergy medications to reduce upper airway inflammation Continue with supplemental home oxygen as needed Discussed buying new pulse ox due to differences in home and office readings. Pt agreeable All questions, concerns answered at this time. Pt understanding of current treatment plan Pt to seek medical attention if new,  worsening, or unknown symptoms present   Return if symptoms worsen or fail to improve.

## 2022-10-16 ENCOUNTER — Ambulatory Visit: Payer: Medicare HMO | Admitting: Physician Assistant

## 2022-10-19 MED ORDER — PULSE OXIMETER MISC: 0 refills | Status: DC

## 2022-10-19 NOTE — Telephone Encounter (Signed)
RX for pulse ox sent to [facility] at [phone] with confirmation received.

## 2022-10-19 NOTE — Addendum Note (Signed)
Addended byAnnamaria Helling on: 10/19/2022 03:15 PM   Modules accepted: Orders

## 2022-10-24 IMAGING — CT CT CHEST HIGH RESOLUTION W/O CM
2 of 7 series · 14 of 36 positions shown, 17 images · non-contrast
Comparison: 11/03/2019.

CLINICAL DATA: Pulmonary fibrosis. Increased shortness of breath,
wheezing and coughing. Possible asbestos exposure.

EXAM:
CT CHEST WITHOUT CONTRAST
TECHNIQUE: Multidetector CT imaging of the chest was performed following the
standard protocol without intravenous contrast. High resolution
imaging of the lungs, as well as inspiratory and expiratory imaging,
was performed.

[Series 4: high resolution · axial · 0.80mm/px · z∈[-302,-42]mm · 11 of 313 slices shown, 14 images]
[im 27/313  mediastinal]
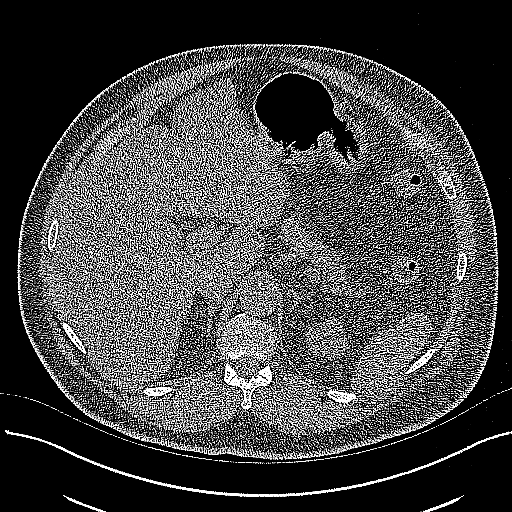
[im 27/313  lung]
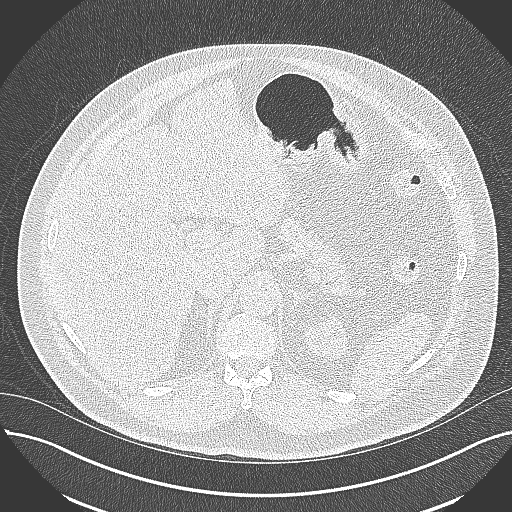
[im 53/313  lung]
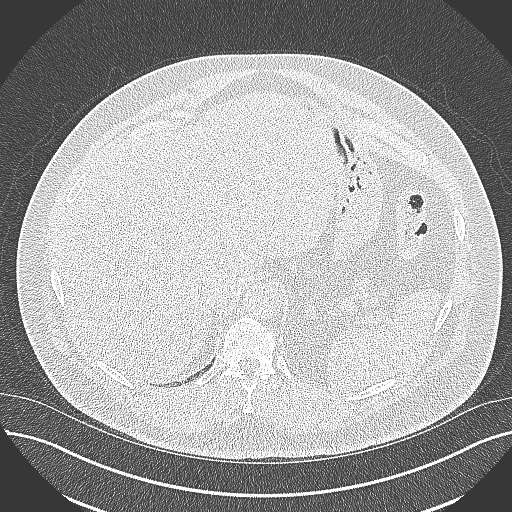
[im 79/313  lung]
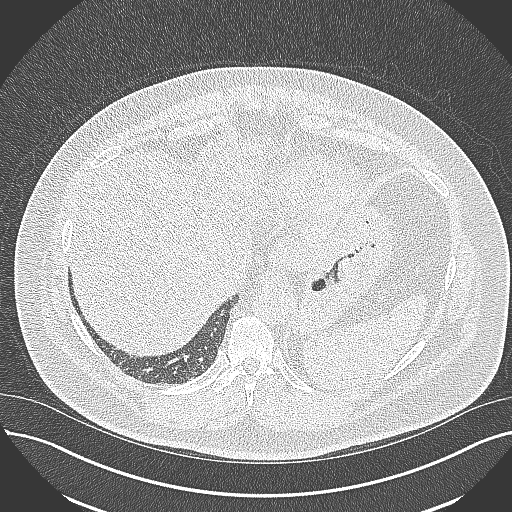
[im 105/313  lung]
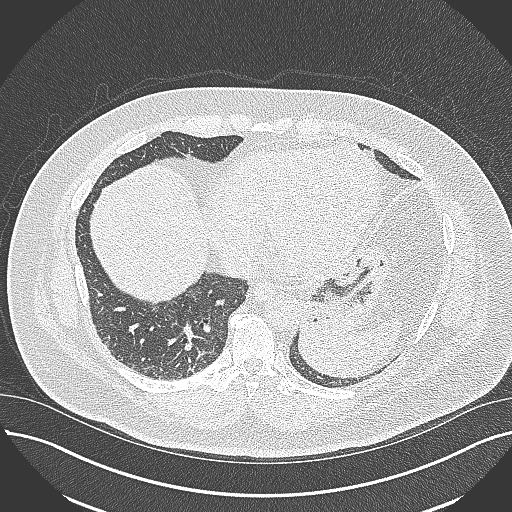
[im 131/313  mediastinal]
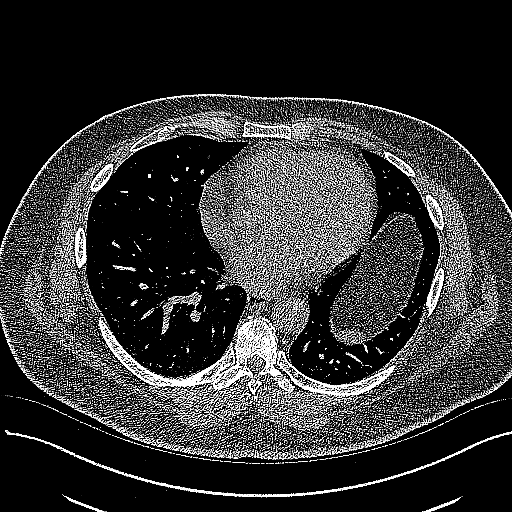
[im 131/313  lung]
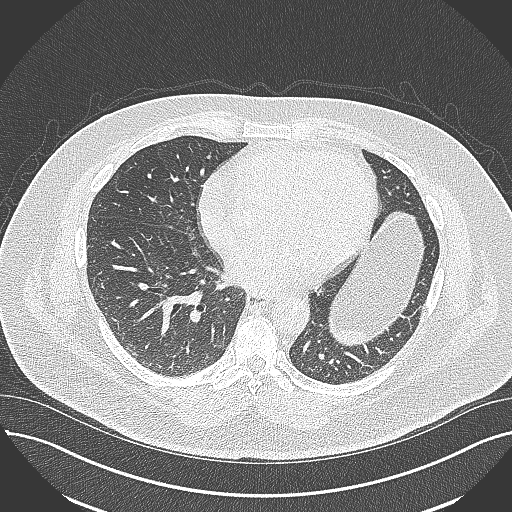
[im 157/313  lung]
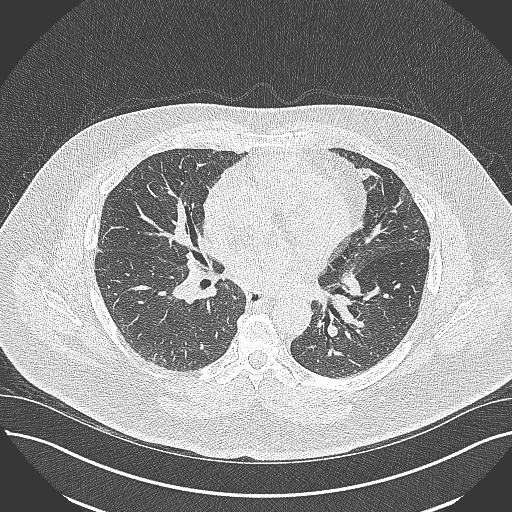
[im 183/313  lung]
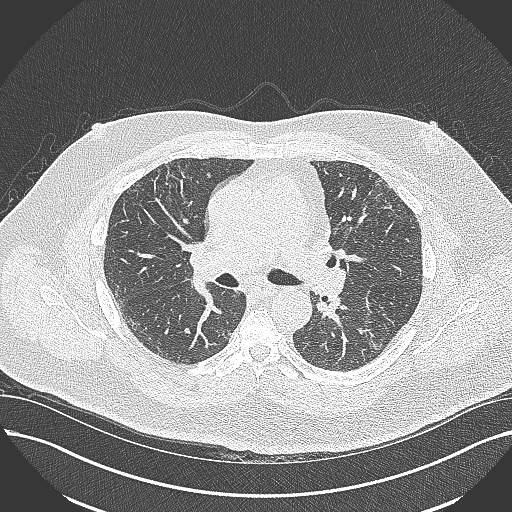
[im 209/313  lung]
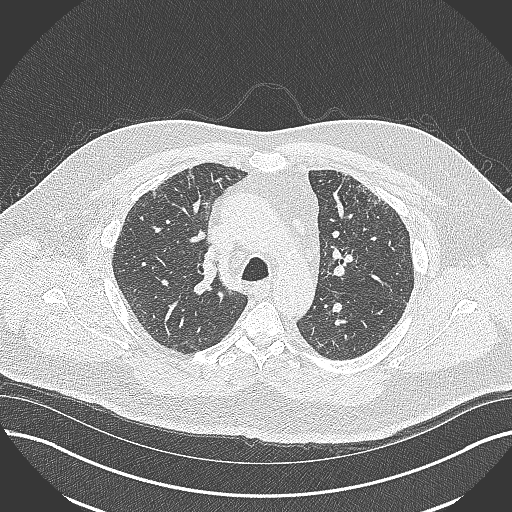
[im 235/313  mediastinal]
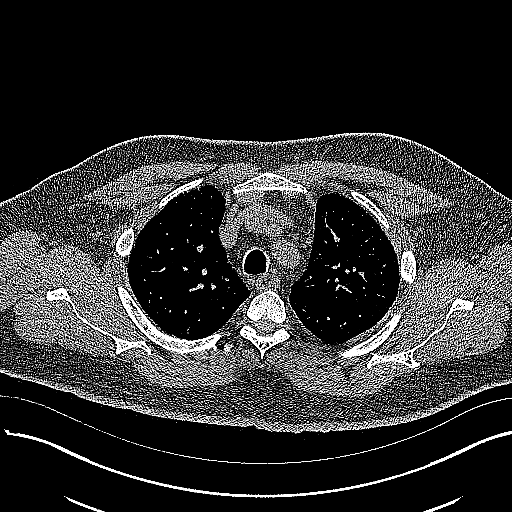
[im 235/313  lung]
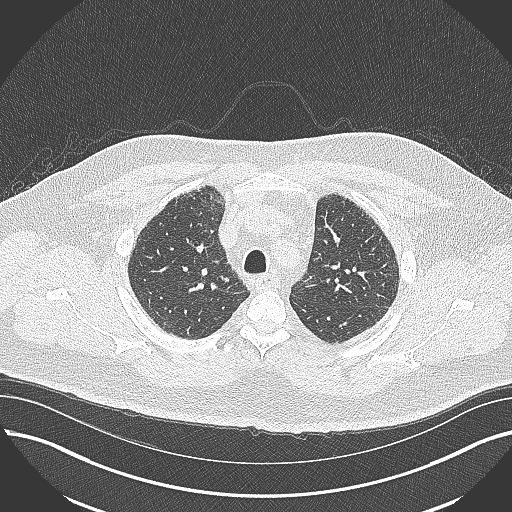
[im 261/313  lung]
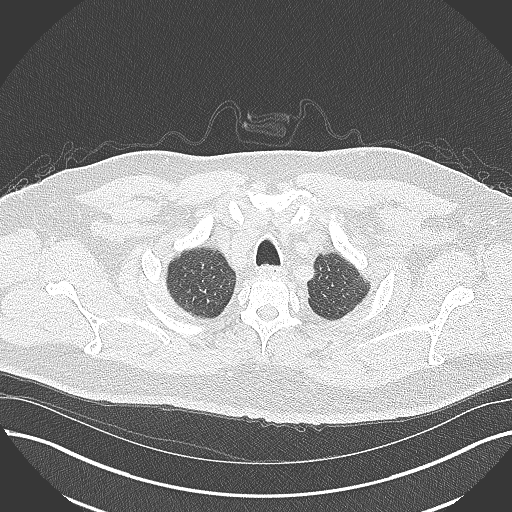
[im 287/313  lung]
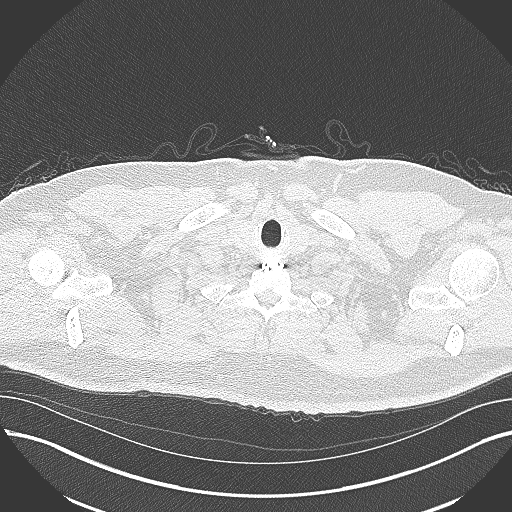

[Series 6: coronal · coronal · 0.63mm/px · 3 of 135 slices shown]
[im 27/135  lung]
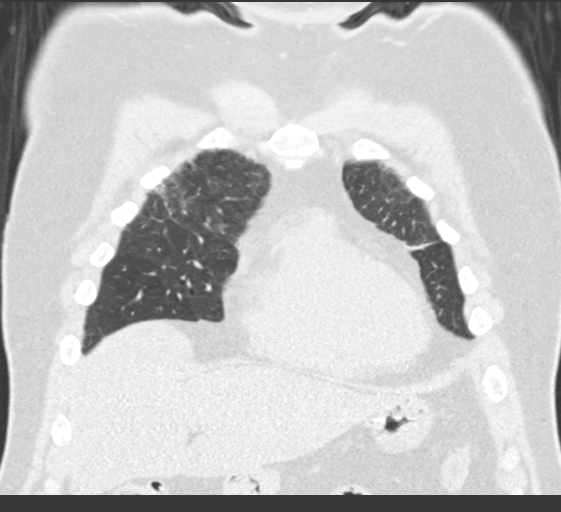
[im 54/135  lung]
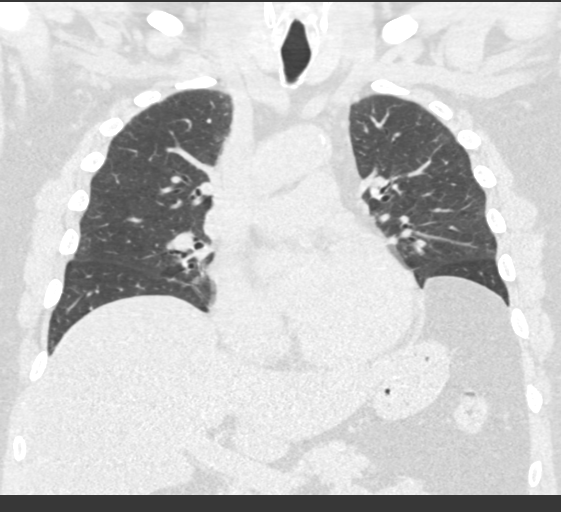
[im 81/135  lung]
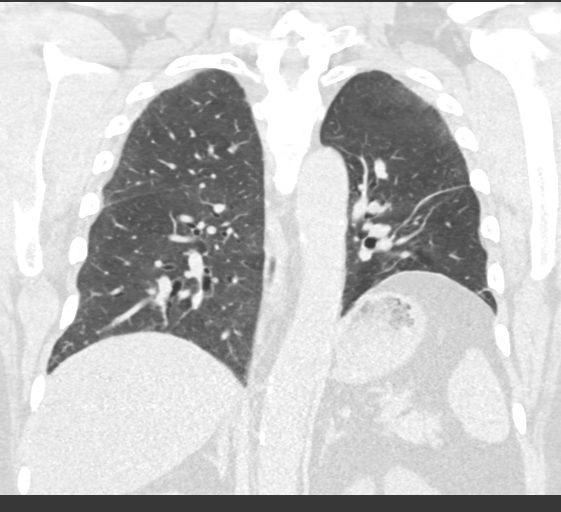

[14 of 36 positions shown; findings below may reference images not displayed]

FINDINGS: Cardiovascular: Atherosclerotic calcification of the aorta, aortic
valve and coronary arteries. Pulmonic trunk and heart are enlarged.
No pericardial effusion.

Mediastinum/Nodes: No pathologically enlarged mediastinal or
axillary lymph nodes. Hilar regions are difficult to definitively
evaluate without IV contrast. Esophagus is grossly unremarkable.

Lungs/Pleura: Image quality is degraded by respiratory motion.
Peribronchovascular and peripheral ground-glass, upper and midlung
zone predominant. Minimal associated traction bronchiectasis.
Subpleural reticulation is not an overwhelming feature. No
honeycombing. Findings may be mildly progressive from 11/03/2019. No
pleural fluid. Airway is unremarkable. There is air trapping.

Upper Abdomen: Liver is decreased in attenuation diffusely and the
margin is slightly irregular. Left hepatic lobe appears somewhat
hypertrophied. Cholecystectomy. Visualized portions of the adrenal
glands, kidneys, spleen, pancreas, stomach and bowel are
unremarkable. No upper abdominal adenopathy.

Musculoskeletal: Degenerative changes in the spine. No worrisome
lytic or sclerotic lesions.
IMPRESSION: 1. Pulmonary parenchymal pattern of interstitial lung disease
appears mildly progressive from 11/03/2019 and may be due to chronic
hypersensitivity pneumonitis or nonspecific interstitial
pneumonitis. Findings are suggestive of an alternative diagnosis
(not UIP) per consensus guidelines: Diagnosis of Idiopathic
Pulmonary Fibrosis: An Official ATS/ERS/JRS/ALAT Clinical Practice
Guideline. Am J Respir Crit Care Med Vol 198, Jumper 5, ppe55-e[DATE]. Liver is steatotic and likely cirrhotic.
3. Aortic atherosclerosis (KIFSD-1CH.H). Coronary artery
calcification.
4. Enlarged pulmonic trunk, indicative of pulmonary arterial
hypertension.

## 2022-10-28 DIAGNOSIS — J849 Interstitial pulmonary disease, unspecified: Secondary | ICD-10-CM | POA: Diagnosis not present

## 2022-10-29 ENCOUNTER — Encounter: Payer: Medicare HMO | Admitting: Internal Medicine

## 2022-10-29 ENCOUNTER — Ambulatory Visit (INDEPENDENT_AMBULATORY_CARE_PROVIDER_SITE_OTHER): Payer: Medicare HMO | Admitting: Internal Medicine

## 2022-10-29 ENCOUNTER — Encounter: Payer: Self-pay | Admitting: Internal Medicine

## 2022-10-29 VITALS — BP 138/82 | HR 74 | Temp 98.0°F | Ht 71.0 in | Wt 229.8 lb

## 2022-10-29 DIAGNOSIS — J849 Interstitial pulmonary disease, unspecified: Secondary | ICD-10-CM

## 2022-10-29 DIAGNOSIS — Z006 Encounter for examination for normal comparison and control in clinical research program: Secondary | ICD-10-CM

## 2022-10-29 DIAGNOSIS — J9611 Chronic respiratory failure with hypoxia: Secondary | ICD-10-CM

## 2022-10-29 LAB — PULMONARY FUNCTION TEST
DL/VA % pred: 94 %
DL/VA: 3.93 ml/min/mmHg/L
DLCO cor % pred: 37 %
DLCO cor: 10.5 ml/min/mmHg
DLCO unc % pred: 37 %
DLCO unc: 10.5 ml/min/mmHg
FEF 25-75 Pre: 3.85 L/s
FEF2575-%Pred-Pre: 129 %
FEV1-%Pred-Pre: 42 %
FEV1-Pre: 1.55 L
FEV1FVC-%Pred-Pre: 118 %
FEV6-%Pred-Pre: 37 %
FEV6-Pre: 1.73 L
FEV6FVC-%Pred-Pre: 104 %
FVC-%Pred-Pre: 35 %
FVC-Pre: 1.73 L
Pre FEV1/FVC ratio: 89 %
Pre FEV6/FVC Ratio: 100 %

## 2022-10-29 NOTE — Progress Notes (Signed)
Spiro and DLCO performed today. 

## 2022-10-29 NOTE — Progress Notes (Signed)
OV 02/03/2022 -transfer of care to Jake [name] at the ILD clinic by Jake [name]  Subjective:  Patient ID: Jake [name], male , DOB: 12/21/1960 , age 62 y.o. , MRN: [id number] , ADDRESS: [place] [id number] PCP Jake [name] Patient Care Team: Jake [name] as PCP - General (Family Medicine) Jake [name] as PCP - Cardiology (Cardiology) Jake [name] as Referring Physician (Pulmonary Disease) Jake [name], Jake [name] as Pharmacist (Pharmacist)  This Provider for this visit: Treatment Team:  Attending Provider: Brand Males, [name]    02/03/2022 -   Chief Complaint  Patient presents with   Follow-up    Pt switching from Dr. Ander Slade to MR for ILD eval. Pt states lately he has had problems of SOB that can happen at any time and also has a chronic cough.     HPI Jake [name] 62 y.o. -history is provided by the wife, patient and also reviewed the medical records and the ILD questionnaire.  He has had insidious onset of shortness of breath for the last few years.  Chart review it appears that in 2020 when he got his respiratory infection [in retrospect he thinks this was COVID before the formal declaration of the pandemic] and since then he has been having progressive shortness of breath.  Especially over the last year or so his progression is worse..  For the last 1 year has been on oxygen 2 L.  He feels he haseven progressed more in the last few months.  And is now needing oxygen 4 L at rest at all the time.  Sometimes it is 3 L.  Wife says he easily desaturates into the 62s and occasionally into the 36s when he exerts.  When he does the bed or showers or does chores increases to 4 L.  He feels a portable oxygen is not helping him anymore.  Particularly more progressive in the last few months.  Recently was tried on prednisone.  He is not sure if it helped but it made him very angry had irate emotional outburst and he could not take prednisone  anymore.  Through all this late last year in 2022 he was off his Entresto for heart failure.  This made the shortness of breath worse but then he went back on it in January and he got better but nevertheless overall progressive dyspnea.     [place] [facility] Comprehensive ILD Questionnaire  Symptoms:     Past Medical History :  Status post CABG in 2004.  He also had cardiac stents approximately 6-12 months ago according to his history with 1 repair of the stent.  Still it did not help the shortness of breath.  He has had COVID at least 3 times.  The first 1 was in January 2020 right before the onset of the pandemic.  The second 1 was in January 2020 and the third was in December 2022 during small bowel obstruction admission.  This was an incidental finding according to the wife  He has chronic systolic heart failure  Other issues include nausea, irritable bowel syndrome, acid reflux back and neck pain  He has obstructive sleep apnea  He has a history of pneumonia several years ago  ROS:  -Positive for fatigue for the last several months, arthralgia for the last several years, dysphagia for the last several decades, dry eyes for the last several months, nausea for the last several years, acid reflux for the  last several years also snoring for the last several years  FAMILY HISTORY of LUNG DISEASE:  Negative for any lung disease  PERSONAL EXPOSURE HISTORY:  -Did smoke in the remote past and quit -Smoked between 1985 and 2008 1 and half packs per day. -Possible cocaine use between 1991 and marijuana use between 1978 and 2008 ": Very little to none   HOME  EXPOSURE and HOBBY DETAILS :  -20 years ago he lived in a house that had black mold he lived there for few years but has not been exposed to that for 20 years.  -Was using a Phillips responding CPAP machine for a few years.  It was subject to recall because of foam silicone  contamination issues that caused respiratory  complications.  He stopped using this in 2021 shortly after the recall was issued.  -He is now living in his daughter's house for the last 3 months.  -The old house did have mold.  It also had mildew in the shower curtain. 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  Restrictive pattern with mild desaturations.  -Echocardiogram March 2022 ` -Ejection fraction 99991111, grade 1 diastolic dysfunction  - BNP May 2023  - 31  - RHC June 2023  -  Jake [name] RHC   Echo 02/12/22: EF 40-45%, mild MR. LA moderately dilated. RV size and function normal. GRade 1 DD. [place] 02/12/22: RA- 5 mmHg; PA 25/12 mmHg, mean PAP 17 mmHg, Wedge 8 mmHg, CO (fick) 6.59 L/mim, CI-2.96 L/min meter2.   03/12/2022: Today - follow up Patient presents today with wife after being started on Ofev. He is having significant nausea with his first dose of Ofev. He wakes up in the morning, takes his zofran and eats a small snack. He then takes his Ofev at 8 am with food as well, and has tried multiple different food options. He then becomes extremely nauseated around 845 and has to go lay down until he is able to take his second dose of Zofran around 1/2 pm. He finally begins to feel better and is able to tolerate his second dose. He is understandably frustrated by this  and does not wish to have this poor quality of life from his medications. He denies any vomiting or diarrhea. He has had some weight gain as he has had to eat so often with the Ofev to try to curve his nausea.   From a respiratory standpoint, he continues to get winded with minimal exertion. He can be changing the bed at home or walk 50 ft to the mailbox and has to sit down afterwards to rest and catch his breath. He has also had drops in his oxygen into the 70's-80's despite his supplemental 4 lpm so he has been minimally active because of this. No significant cough or wheezing. He has not noticed any difference in his  activity tolerance or lung function since starting the Ofev, but has felt so terrible that he's not sure if he would notice much.           OV 05/05/2022  Subjective:  Patient ID: Jake [name], male , DOB: [birth year] , age 41 y.o. , MRN: [id number] , ADDRESS: [place] [address] PCP Jake [name] Patient Care Team: Jake [name] as PCP - General (Family Medicine) Jake [name] as PCP - Cardiology (Cardiology) Jake [name] as Referring Physician (Pulmonary Disease) Jake [name], [facility] as Pharmacist (Pharmacist)  This Provider for this visit: Treatment Team:  Attending Provider: Brand Males, [name]    05/05/2022 -   Chief Complaint  Patient presents with   Follow-up    Pt states he feels like his breathing has become worse since last visit. Also states he has had a dry cough, chest discomfort, and has been having GI issues due to the [place].     HPI Jake [name] 62 y.o. --presents for follow-up.  Presents with his wife.  He has non-- IPF progressive phenotype ILD.  Suspected etiology usage of CPAP machine Philips Respironics.  He tried nintedanib but he had significant intolerance.  He is currently on fourth week of pirfenidone at full dose for the last 1 week.  He is having significant diarrhea nausea and  fatigue because of this.  At 2 pills 3 times daily it was more tolerable.  He is willing to go back to 2 pills 3 times daily and try.  He also significant anxiety and depression.  He is on Zoloft.  In the past he has tried SNRI or some other agent according to the wife but he had side effects.  I have recommended they talk with the primary care physician about this.  He is also trying to lose weight.  He has been prescribed Wegovy but there is no stock available.  He has been looked at [facility] for lung transplant but given his low EF he has been disqualified and they [name] not want to see him.  He did have a right heart catheterization in June 2023 and this was normal.  According to him after some delay from our office he has been referred to Sueanne Margarita at [facility] for lung transplant evaluation.  But he has not heard back from them.  I personally emailed Dr. Alver Sorrow office.  Also he tells me that with 4 L nasal cannula at rest he is fine but when he does any laundry he desaturates into the 80s.  He has tried increasing to 5 L nasal cannula.  I advised that he could increase it further and keep his pulse ox over 86% and monitor it.  He is willing to [name] that.  They are not interested in immunomodulators right now because of young kids in the house.    Overall he and his wife are worried that he is declining.  He did have pulmonary function test today and?  Slight decline compared to May 2023.[name] Kitchen  He had liver function test monitoring blood work yesterday but so far the results are not back yet.   SUMMARY VISIT Dr Ovid Curd 06/09/22 at [facility]   He also agrees that patient does not have IPF.  CT scan is not consistent with UIP.  Currently pirfenidone is causing too many side effects.  He wants to stop it.  Because there is more suggestion of valvulitis he wants to  start CellCept.  He is going to start the patient on 500 mg twice daily today.  Patient seeing me in approximately a month from now.  At  the time I will increase to a total of 1 g twice daily.   He also wants the patient referred to pulmonary rehabilitation.   In terms of lung transplantation a lot of relative contraindications including weight and mild cardiomyopathy and intolerance to steroids.  At this point in time he wants patient to be more active and get into rehab  Of note patient only desaturated to 88% on room air and that walk test today   Plan  - Get patient in for CBC chemistry and liver function test in 2-3 weeks from today  -Refer to pulmonary rehabilitation   OV 07/20/2022  Subjective:  Patient ID: Jake [name], male , DOB: 05-01-1961 , age 46 y.o. , MRN: [id number] , ADDRESS: [place] [id number] PCP Jake [name] Patient Care Team: Jake [name] as PCP - General (Family Medicine) Jake [name] as PCP - Cardiology (Cardiology) Jake [name] as Referring Physician (Pulmonary Disease) Jake [name], [facility] as Pharmacist (Pharmacist)  This Provider for this visit: Treatment Team:  Attending Provider: Brand Males, [name]    07/20/2022 -   Chief Complaint  Patient presents with   Follow-up    Pt states he has been doing okay since last visit. Has not worn his O2 in about a month and states that he has been walking about 0.49mle a day.     HPI Jake Fridman89y.o. -returns for follow-up with his wife.  Since his last visit he has lost over 10 pounds because of Ozempic.  He started his Ozempic.  Then approximately a month ago he did [facility]  At the time his exercise hypoxemia improved.  He started beginning to feel better as well.  According to the wife and him a day prior to that he was still feeling miserable but on the day of the visit he started noticing a turnaround and since then he has had sustained turnaround.  He was having side effects from pirfenidone and this was stopped early October 2023.  Dr.  NOvid Curdrecommended mycophenolate mofetil [patient mistakenly thought this was metoprolol at this visit but later confirmed that he has been on metoprolol for a long time to Dr. CStanford Breedfor his heart failure].  Since then he is having sustained improvement in dyspnea.  Also he was able to go to [place] walk on flat ground for a long time without desaturating.  He still desaturates but it is only for heavy exertion such as vacuuming or lifting heavy load.  He will go into the low 80s and then rest to recover.  He does not use his oxygen for his hypoxemia.  Instead he opted to rest.  At this point in time he is telling me because of his improvement he does not want to [name] his mycophenolate [Steele Sizeris going to confirm it was indeed mycophenolate and not metoprolol].  I referred him to pulm rehabilitation but this was expensive.  He is opting to exercise by himself and continue weight loss with Ozempic.  Also because of his improvement he is asking if he can [name] a pulmonary function test.  His walking desaturation test is improved.  He also his pulmonary function test shows improvement although he still worse compared to June of  last year.  His last echocardiogram was in the summer 2023 and he still had low ejection fraction.  His BNP at that time was normal.  In terms of lung transplant Dr. Ovid Curd is told him it is too high risk given his medication side effects and heart failure and obesity.  At this point in time he is reconciled to the fact that he will not be a transplant candidate.  However he is overall pleased that he is improved.         OV 10/29/2022  Subjective:  Patient ID: Jake [name], male , DOB: 04-Jan-1961 , age 81 y.o. 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 - Returns for follow-up with his wife.  Overall he reports being stable.  Weight is stable.  He is no longer on antifibrotic's because of intolerance.  His ejection fraction has dropped to 30% and he is frustrated by that.  Wife states that he does desaturate when he exerts and then he rests and the oxygen goes up.  She would prefer for him to wear the oxygen preemptively.  I encouraged him to [name] so because that is a better strategy.  At this point in time he states that he had a rough flu season he had stomach flu twice.  He believes he might have "once in 2 or 3 other respiratory infections again denies any mold or mildew or bird feathers in the house he does [name] housework but has to wear oxygen for that he can go shopping without oxygen but wife suspects he might be desaturating.  We did discuss clinical trials as a care option.  Presented to have inhaled pirfenidone trial that is yet to start and also inhaled treprostinil trial that is ongoing.  They are interested in this.  Discussed the following care concept about clinical trials.  Will reassess for this and second quarter or third quarter 2024.  He did have a CT scan of the chest Jerry 2024 I reviewed the result.  ILD is described but there is no other abnormality described.  Since CT scan with contrast so we cannot compare the CT scans.    1. Scientific Purpose  Clinical  research is designed to produce generalizable knowledge and to answer questions about the safety and efficacy of intervention(s) under study in order to determine whether or not they may be useful for the care of future patients.  2. Study Procedures  Participation in a trial may involve procedures or tests, in addition to the intervention(s) under study, that are intended only or primarily to generate scientific knowledge and that are otherwise not necessary for patient care.   3. Uncertainty  For intervention(s) under study in clinical research, there often is less knowledge and more uncertainty about the risks and benefits  to a population of trial participants than there is when a doctor offers a patient standard interventions.   4. Adherence to Protocol  Administration of the intervention(s) under study is typically based on a strict protocol with defined dose, scheduling, and use or avoidance of concurrent medications, compared to administration of standard interventions.  5. Clinician as Investigator  Clinicians who are in health care settings provide treatment; in a clinical trial setting, they are also investigating safety and efficacy of an intervention. In otherwise your doctor or nurse practitioner can be wearing 2 hats - one as care giver another as Company secretary  6. Patient as Visual merchandiser Subject  Patients participating in research trials are research subjects or volunteers. In other words participating in research is 100% voluntary and at one's own free weill. The decision to participate or not participate will NOT affect patient care and the doctor-patient relationship in any way    SYMPTOM SCALE - ILD 02/03/2022 07/20/2022 Off esbreit and ofev 10/29/22 Ef now 30-35%  Current weight 231# 224# - ozempic 229#  O2 use 4L [place] rest  Ra rest  Shortness of Breath 0 -> 5 scale with 5 being worst (score 6 If unable to [name])    At rest 2 0 2  Simple tasks - showers,  clothes change, eating, shaving 5 2 3  $ Household (dishes, doing bed, laundry) 5 4 5  $ Shopping 5 2 1  $ Walking level at own pace 4 3 2  $ Walking up Stairs 5 4 4  $ Total (30-36) Dyspnea Score 26 15 17      $ Non-dyspnea symptoms (0-> 5 scale) 02/03/2022 07/20/2022  10/29/2022   How bad is your cough? 4 3 1  $ How bad is your fatigue 5 2 3  $ How bad is nausea 4 5 4  $ How bad is vomiting?  0 0 0  How bad is diarrhea? 0 1 0  How bad is anxiety? 5 1 2  $ How bad is depression 5 1 2  $ Any chronic pain - if so where and how bad 3 x      Simple office walk 185 feet x  3 laps goal with forehead probe 07/20/2022    O2 used ra   Number laps completed 3   Comments about pace avg   Resting Pulse Ox/HR 99% and 85/min   Final Pulse Ox/HR 93% and 100/min   Desaturated </= 88% no   Desaturated <= 3% points Yes 6    Got Tachycardic >/= 90/min yes   Symptoms at end of test Mild dyspea   Miscellaneous comments improved     HRCT Chest data    PFT   - Forced vital capacity 2.06 L / 41% and DLCO 12.55/45%.  Roughly stable compared to October 2023.     Latest Ref Rng & Units 07/02/2022   11:35 AM 05/04/2022    3:32 PM 01/15/2022    3:58 PM 02/26/2021   12:00 PM  PFT Results  FVC-Pre L 2.20  1.87  1.91  2.44   FVC-Predicted Pre % 44  38  38  49   FVC-Post L   1.97  2.35   FVC-Predicted Post %   40  47   Pre FEV1/FVC % % 91  94  96  93   Post FEV1/FCV % %   90  91   FEV1-Pre L 2.01  1.77  1.83  2.27   FEV1-Predicted Pre % 54  47  49  60  FEV1-Post L   1.78  2.15   DLCO uncorrected ml/min/mmHg 12.27  11.35  14.05  16.66   DLCO UNC% % 43  40  49  58   DLCO corrected ml/min/mmHg 12.27  11.93  14.05  16.66   DLCO COR %Predicted % 43  42  49  58   DLVA Predicted % 86  92  105  101   TLC L   4.36  4.52   TLC % Predicted %   60  62   RV % Predicted %   60  66        has a past medical history of Arthritis, Bipolar disorder ([place]), GERD (gastroesophageal reflux disease), Hyperlipidemia,  Hypertension, and 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   reports that he quit smoking about 14 years ago. His smoking use included cigarettes. He started smoking about 42 years ago. He has a 28.00 pack-year smoking history. He has never used smokeless tobacco.  Past Surgical History:  Procedure Laterality Date   ANKLE FRACTURE SURGERY Left    APPENDECTOMY     BIOPSY  11/18/2021   Procedure: BIOPSY;  Surgeon: Lavena Bullion, [name];  Location: [facility];  Service: Gastroenterology;;  EGD and COLON   CERVICAL FUSION     CHOLECYSTECTOMY     COLON SURGERY     COLONOSCOPY WITH PROPOFOL N/A 11/18/2021   Procedure: COLONOSCOPY WITH PROPOFOL;  Surgeon: Lavena Bullion, [name];  Location: [facility];  Service: Gastroenterology;  Laterality: N/A;   ESOPHAGOGASTRODUODENOSCOPY (EGD) WITH PROPOFOL N/A 11/18/2021   Procedure: ESOPHAGOGASTRODUODENOSCOPY (EGD) WITH PROPOFOL;  Surgeon: Lavena Bullion, [name];  Location: [facility];  Service: Gastroenterology;  Laterality: N/A;   HERNIA REPAIR     POLYPECTOMY  11/18/2021   Procedure: POLYPECTOMY;  Surgeon: Lavena Bullion, [name];  Location: [facility];  Service: Gastroenterology;;   RIGHT HEART CATH N/A 02/13/2022   Procedure: RIGHT HEART CATH;  Surgeon: Burnell Blanks, [name];  Location: [facility];  Service: Cardiovascular;  Laterality: N/A;    Allergies  Allergen Reactions   Fentanyl Other (See Comments)    Avoid opiates Other reaction(s): Other Avoid opiates   Other     04/06/18 Patient is a recovering narcotic abuser and, per wife, is NEVER to receive controlled substances (e.g. Narcotics, sedatives, etc.) Other reaction(s): Unknown 04/06/18 Patient is a recovering narcotic abuser and, per wife, is NEVER to receive controlled substances (e.g. Narcotics, sedatives, etc.)   Aspirin Nausea And Vomiting   Lipitor [Atorvastatin] Nausea And Vomiting    GI side effects.     Prednisone Other (See Comments)      Agitation Severe anger outbursts   Ofev [Nintedanib]     GI side effects   Pirfenidone    Contrast Media [Iodinated Contrast Media] Palpitations    Patient states he tolerates now   Nitroglycerin Nausea And Vomiting    Immunization History  Administered Date(s) Administered   Influenza Inj Mdck Quad Pf 06/18/2022   Influenza,inj,Quad PF,6+ Mos 07/16/2014, 07/21/2017, 08/22/2018, 06/13/2019, 06/10/2021   Influenza-Unspecified 07/16/2014, 06/14/2016   PFIZER Comirnaty(Gray Top)Covid-19 Tri-Sucrose Vaccine 06/18/2022   PFIZER(Purple Top)SARS-COV-2 Vaccination 11/21/2019, 12/24/2019, 06/23/2020   Respiratory Syncytial Virus Vaccine,Recomb Aduvanted(Arexvy) 07/23/2022   Td 04/15/2015   Td (Adult), 2 Lf Tetanus Toxid, Preservative Free 04/15/2015   Tdap 10/10/2009   Zoster Recombinat (Shingrix) 06/10/2021    Family History  Problem Relation Age of Onset   Cancer Mother        breast  Depression Mother    Hyperlipidemia Mother    Dementia Mother    Alcohol abuse Father    Cancer Father        skin   Hyperlipidemia Father    Alzheimer's disease Father    Cancer Maternal Grandfather    Cancer Paternal Grandmother    Pancreatic cancer Paternal Uncle    Colon cancer Neg Hx    Esophageal cancer Neg Hx    Stomach cancer Neg Hx      Current Outpatient Medications:    albuterol (VENTOLIN HFA) 108 (90 Base) MCG/ACT inhaler, TAKE 2 PUFFS BY MOUTH EVERY 6 HOURS AS NEEDED, Disp: 18 each, Rfl: 3   AMBULATORY NON FORMULARY MEDICATION, Continuous positive airway pressure (CPAP) machine set at autotitration of H2O pressure, with all supplemental supplies as needed., Disp: 1 each, Rfl: 0   chlorthalidone (HYGROTON) 25 MG tablet, TAKE 1 TABLET BY MOUTH EVERY DAY, Disp: 90 tablet, Rfl: 1   colestipol (COLESTID) 1 g tablet, TAKE 1 TABLET BY MOUTH EVERY DAY, Disp: 90 tablet, Rfl: 1   dicyclomine (BENTYL) 20 MG tablet, TAKE 1 TABLET BY MOUTH THREE TIMES A DAY, Disp: 90 tablet, Rfl: 0    doxycycline (VIBRA-TABS) 100 MG tablet, Take 1 tablet (100 mg total) by mouth 2 (two) times daily., Disp: 20 tablet, Rfl: 0   ENTRESTO 49-51 MG, Take 1 tablet by mouth 2 (two) times daily., Disp: , Rfl:    finasteride (PROSCAR) 5 MG tablet, TAKE 1 TABLET (5 MG TOTAL) BY MOUTH DAILY., Disp: 30 tablet, Rfl: 5   fluticasone (FLONASE) 50 MCG/ACT nasal spray, Place into both nostrils daily., Disp: , Rfl:    ibuprofen (ADVIL) 800 MG tablet, TAKE 1 TABLET BY MOUTH EVERY 8 HOURS AS NEEDED, Disp: 90 tablet, Rfl: 5   KLOR-CON M10 10 MEQ tablet, TAKE 1 TABLET BY MOUTH TWICE A DAY, Disp: 60 tablet, Rfl: 1   loratadine (CLARITIN) 10 MG tablet, Take 10 mg by mouth in the morning., Disp: , Rfl:    Misc. Devices (PULSE OXIMETER) MISC, Monitor oxygen three times per day., Disp: 1 each, Rfl: 0   modafinil (PROVIGIL) 200 MG tablet, Take 1 tablet (200 mg total) by mouth daily., Disp: 30 tablet, Rfl: 5   ondansetron (ZOFRAN) 8 MG tablet, TAKE 1 TABLET BY MOUTH EVERY 8 HOURS AS NEEDED FOR NAUSEA FOR VOMITING, Disp: 90 tablet, Rfl: 2   pantoprazole (PROTONIX) 40 MG tablet, TAKE 1 TABLET BY MOUTH TWICE A DAY, Disp: 180 tablet, Rfl: 1   sertraline (ZOLOFT) 100 MG tablet, Take 2 tablets (200 mg total) by mouth daily., Disp: 180 tablet, Rfl: 1   sodium bicarbonate 650 MG tablet, TAKE ONE TABLET BY MOUTH 4 TIMES DAILY, Disp: 120 tablet, Rfl: 5   triamcinolone (NASACORT) 55 MCG/ACT AERO nasal inhaler, Place 2 sprays into the nose daily as needed (allergies (when pollen is very high))., Disp: , Rfl:    metoprolol succinate (TOPROL-XL) 50 MG 24 hr tablet, TAKE 1 TABLET BY MOUTH DAILY. TAKE WITH OR IMMEDIATELY FOLLOWING A MEAL. (Patient not taking: Reported on 10/14/2022), Disp: 90 tablet, Rfl: 2   Semaglutide,0.25 or 0.5MG/DOS, (OZEMPIC, 0.25 OR 0.5 MG/DOSE,) 2 MG/1.5ML SOPN, Inject 0.5 mg into the skin once a week. (Patient not taking: Reported on 10/14/2022), Disp: , Rfl:   Current Facility-Administered Medications:     methylPREDNISolone sodium succinate (SOLU-MEDROL) 125 mg/2 mL injection 125 mg, 125 mg, Intramuscular, Once, Breeback, Jade L, [name]   sodium chloride flush (NS) 0.9 % injection  3 mL, 3 mL, Intravenous, Q12H, Crenshaw, Denice Bors, [name]      Objective:   Vitals:   10/29/22 1329  BP: 138/82  Pulse: 74  Temp: 98 F (36.7 C)  TempSrc: Oral  SpO2: 93%  Weight: 229 lb 12.8 oz (104.2 kg)  Height: 5' 11"$  (1.803 m)    Estimated body mass index is 32.05 kg/m as calculated from the following:   Height as of this encounter: 5' 11"$  (1.803 m).   Weight as of this encounter: 229 lb 12.8 oz (104.2 kg).  @WEIGHTCHANGE$ @  [employer]   10/29/22 1329  Weight: 229 lb 12.8 oz (104.2 kg)     Physical Exam    General: No distress. Looks well Neuro: Alert and Oriented x 3. GCS 15. Speech normal Psych: Pleasant Resp:  Barrel Chest - no.  Wheeze - no, Crackles - YES BASE, No overt respiratory distress CVS: Normal heart sounds. Murmurs - no Ext: Stigmata of Connective Tissue Disease - no HEENT: Normal upper airway. PEERL +. No post nasal drip        Assessment:       ICD-10-CM   1. ILD (interstitial lung disease) ([place])  J84.9     2. Chronic respiratory failure with hypoxia (HCC)  J96.11          Plan:     Patient Instructions  ILD (interstitial lung disease) ([place]) Chronic respiratory failure with hypoxia (HCC)  You have had ILD - possible onset spring 2021 -> definitely seen in  2022 and more progressive May 2023 -> you seem improved Oct/Nov 2023 -> stable Feb 2023  Normal right heart cath June 2023  Pattern does NOT fit In with IPF Pattern could be a condition called HP - hypersensitivty pneumonitis Biopsy being avoided due to risk Noted severe prednisone intolerance  Associated chronic systolic heart failrue  - ef 35% currently - declined by [name] lung transplant team - summer 2023 - not a candidate for lung transplatn per Lenn Sink - oct 2023    CT chest  jan 2024 without cancer    [place] stable  Plan --  continue o2 with exerttion   - increase as needed to to keep pulse ox > 88%  - important to protect your heart  - use it pre-emptively  - no need for o2 at rest  - can consider you for clinical trials in future  - meet research team for ILD-PRO today 10/29/2022   Followup  -4-6 months [name] spiro/dlco  -4-6 months with DR Chase [name] or first available - 30 min visit  - can cancel these visitis if in research protocol  (Level 04: Estb 30-39 min visit type: on-site physical face to visit visit spent in total care time and counseling or/and coordination of care by this undersigned [name] - Dr Jake Males. This includes one or more of the following on this same day 10/29/2022: pre-charting, chart review, note writing, documentation discussion of test results, diagnostic or treatment recommendations, prognosis, risks and benefits of management options, instructions, education, compliance or risk-factor reduction. It excludes time spent by the [place] or office staff in the care of the patient . Actual time is 35 min)   SIGNATURE    Dr. Brand Males, M.D., F.C.C.P,  Pulmonary and Critical Care Medicine Staff Physician, [place] Director - Interstitial Lung Disease  Program  Pulmonary [place] at [place], [place], [address]  Pager: [phone], If no answer  or between  15:00h - 7:00h: call [phone] Telephone: [phone]  2:11 PM 10/29/2022

## 2022-10-29 NOTE — Patient Instructions (Addendum)
ILD (interstitial lung disease) ([place]) Chronic respiratory failure with hypoxia (HCC)  You have had ILD - possible onset spring 2021 -> definitely seen in  2022 and more progressive May 2023 -> you seem improved Oct/Nov 2023 -> stable Feb 2023  Normal right heart cath June 2023  Pattern does NOT fit In with IPF Pattern could be a condition called HP - hypersensitivty pneumonitis Biopsy being avoided due to risk Noted severe prednisone intolerance  Associated chronic systolic heart failrue  - ef 35% currently - declined by [facility] team - summer 2023 - not a candidate for lung transplatn per Lenn Sink - oct 2023    CT chest jan 2024 without cancer    [place] stable  Plan --  continue o2 with exerttion   - increase as needed to to keep pulse ox > 88%  - important to protect your heart  - use it pre-emptively  - no need for o2 at rest  - can consider you for clinical trials in future  - meet research team for ILD-PRO today 10/29/2022   Followup  -4-6 months do spiro/dlco  -4-6 months with DR Chase Caller or first available - 30 min visit  - can cancel these visitis if in research protocol

## 2022-10-29 NOTE — Patient Instructions (Signed)
Spiro and DLCO performed today. 

## 2022-10-29 NOTE — Research (Signed)
Title: Chronic Fibrosing Interstitial Lung Disease with Progressive Phenotype Prospective Outcomes (ILD-PRO) Registry    Protocol #: IPF-PRO-SUB, Clinical Trials # [id number], Sponsor: [employer]   Protocol Version Amendment 4 dated 12Sep2019  and confirmed current on  Consent Version for today's visit date of  Is [place] IRB Approved Version 11 Aug 2018 Revised 11 Aug 2018   Objectives:  Describe current approaches to diagnosis and treatment of chronic fibrosing ILDs with progressive phenotype  Describe the natural history of chronic fibrosing ILDs with progressive phenotype  Assess quality of life from self-administered participant reported questionnaires for each disease group  Describe participant interactions with the healthcare system, describe treatment practices across multiple institutions for each disease group  Collect biological samples linked to well characterized chronic fibrosing ILDs with progressive phenotype to identify disease biomarkers  Collect data and biological samples that will support future research studies.                                            Key Inclusion Criteria: Willing and able to provide informed consent  Age ? 30 years  Diagnosis of a non-IPF ILD of any duration, including, but not limited to Idiopathic Non-Specific Interstitial Pneumonia (INSIP), Unclassifiable Idiopathic Interstitial Pneumonias (IIPs), Interstitial Pneumonia with Autoimmune Features (IPAF), Autoimmune ILDs such as Rheumatoid Arthritis (RA-ILD) and Systemic Sclerosis (SSC-ILD), Chronic Hypersensitivity Pneumonitis (HP), Sarcoidosis or Exposure-related ILDs such as asbestosis.  Chronic fibrosing ILD defined by reticular abnormality with traction bronchiectasis with or without honeycombing confirmed by chest HRCT scan and/or lung biopsy.  Progressive phenotype as defined by fulfilling at least one of the criteria below of fibrotic changes (progression set point)  within the last 24 months regardless of treatment considered appropriate in individual ILDs:  decline in FVC % predicted (% pred) based on >10% relative decline  decline in FVC % pred based on ? 5 - <10% relative decline in FVC combined with worsening of respiratory symptoms as assessed by the site investigator  decline in FVC % pred based on ? 5 - <10% relative decline in FVC combined with increasing extent of fibrotic changes on chest imaging (HRCT scan) as assessed by the site investigator  decline in DLCO % pred based on ? 10% relative decline  worsening of respiratory symptoms as well as increasing extent of fibrotic changes on chest imaging (HRCT scan) as assessed by the site investigator independent of FVC change.     Key Exclusion Criteria: Malignancy, treated or untreated, other than skin or early stage prostate cancer, within the past 5 years  Currently listed for lung transplantation at the time of enrollment  Currently enrolled in a clinical trial at the time of enrollment in this registry       Clinical Research Coordinator / Research RN note : This visit for Jake [name]  Subject [id number] with DOB:11-21-1960  on 22/Feb/2024  for the above protocol is Visit/Encounter 1 and is for purpose of research.    Subject expressed continued interest and consent in continuing as a study subject. Subject confirmed that there was no change in contact information (e.g. address, telephone, email). Subject thanked for participation in research and contribution to science.     During this visit on 22/Feb/2024  , the subject completed the blood work and questionnaires per the above referenced protocol. Please refer to the subject's paper source binder for further details.  Signed by [place], [place] 22/Feb/2024  02:23pm

## 2022-11-04 ENCOUNTER — Other Ambulatory Visit: Payer: Self-pay | Admitting: Physician Assistant

## 2022-11-04 DIAGNOSIS — K580 Irritable bowel syndrome with diarrhea: Secondary | ICD-10-CM

## 2022-11-10 ENCOUNTER — Other Ambulatory Visit: Payer: Self-pay | Admitting: Physician Assistant

## 2022-11-10 ENCOUNTER — Other Ambulatory Visit: Payer: Self-pay | Admitting: Pulmonary Disease

## 2022-11-10 ENCOUNTER — Other Ambulatory Visit: Payer: Self-pay | Admitting: Psychiatry

## 2022-11-10 DIAGNOSIS — M62830 Muscle spasm of back: Secondary | ICD-10-CM

## 2022-11-10 DIAGNOSIS — G4733 Obstructive sleep apnea (adult) (pediatric): Secondary | ICD-10-CM

## 2022-11-10 DIAGNOSIS — M545 Low back pain, unspecified: Secondary | ICD-10-CM

## 2022-11-16 DIAGNOSIS — J9612 Chronic respiratory failure with hypercapnia: Secondary | ICD-10-CM | POA: Diagnosis not present

## 2022-11-16 DIAGNOSIS — N400 Enlarged prostate without lower urinary tract symptoms: Secondary | ICD-10-CM | POA: Diagnosis not present

## 2022-11-16 DIAGNOSIS — I5022 Chronic systolic (congestive) heart failure: Secondary | ICD-10-CM | POA: Diagnosis not present

## 2022-11-16 DIAGNOSIS — J9611 Chronic respiratory failure with hypoxia: Secondary | ICD-10-CM | POA: Diagnosis not present

## 2022-11-16 DIAGNOSIS — I10 Essential (primary) hypertension: Secondary | ICD-10-CM | POA: Diagnosis not present

## 2022-11-16 DIAGNOSIS — Z6833 Body mass index (BMI) 33.0-33.9, adult: Secondary | ICD-10-CM | POA: Diagnosis not present

## 2022-11-16 DIAGNOSIS — Z8709 Personal history of other diseases of the respiratory system: Secondary | ICD-10-CM | POA: Diagnosis not present

## 2022-11-16 DIAGNOSIS — I2510 Atherosclerotic heart disease of native coronary artery without angina pectoris: Secondary | ICD-10-CM | POA: Diagnosis not present

## 2022-11-16 DIAGNOSIS — I447 Left bundle-branch block, unspecified: Secondary | ICD-10-CM | POA: Diagnosis not present

## 2022-11-16 DIAGNOSIS — Z87891 Personal history of nicotine dependence: Secondary | ICD-10-CM | POA: Diagnosis not present

## 2022-11-16 DIAGNOSIS — K219 Gastro-esophageal reflux disease without esophagitis: Secondary | ICD-10-CM | POA: Diagnosis not present

## 2022-11-16 DIAGNOSIS — N1831 Chronic kidney disease, stage 3a: Secondary | ICD-10-CM | POA: Diagnosis not present

## 2022-11-16 DIAGNOSIS — I428 Other cardiomyopathies: Secondary | ICD-10-CM | POA: Diagnosis not present

## 2022-11-16 DIAGNOSIS — Z886 Allergy status to analgesic agent status: Secondary | ICD-10-CM | POA: Diagnosis not present

## 2022-11-16 DIAGNOSIS — R079 Chest pain, unspecified: Secondary | ICD-10-CM | POA: Diagnosis not present

## 2022-11-16 DIAGNOSIS — K589 Irritable bowel syndrome without diarrhea: Secondary | ICD-10-CM | POA: Diagnosis not present

## 2022-11-16 DIAGNOSIS — I129 Hypertensive chronic kidney disease with stage 1 through stage 4 chronic kidney disease, or unspecified chronic kidney disease: Secondary | ICD-10-CM | POA: Diagnosis not present

## 2022-11-16 DIAGNOSIS — J849 Interstitial pulmonary disease, unspecified: Secondary | ICD-10-CM | POA: Diagnosis not present

## 2022-11-16 DIAGNOSIS — Z743 Need for continuous supervision: Secondary | ICD-10-CM | POA: Diagnosis not present

## 2022-11-16 DIAGNOSIS — J84112 Idiopathic pulmonary fibrosis: Secondary | ICD-10-CM | POA: Diagnosis not present

## 2022-11-16 DIAGNOSIS — F419 Anxiety disorder, unspecified: Secondary | ICD-10-CM | POA: Diagnosis not present

## 2022-11-16 DIAGNOSIS — I214 Non-ST elevation (NSTEMI) myocardial infarction: Secondary | ICD-10-CM | POA: Diagnosis not present

## 2022-11-16 DIAGNOSIS — Z888 Allergy status to other drugs, medicaments and biological substances status: Secondary | ICD-10-CM | POA: Diagnosis not present

## 2022-11-16 DIAGNOSIS — G4733 Obstructive sleep apnea (adult) (pediatric): Secondary | ICD-10-CM | POA: Diagnosis not present

## 2022-11-16 DIAGNOSIS — E662 Morbid (severe) obesity with alveolar hypoventilation: Secondary | ICD-10-CM | POA: Diagnosis not present

## 2022-11-16 DIAGNOSIS — R69 Illness, unspecified: Secondary | ICD-10-CM | POA: Diagnosis not present

## 2022-11-16 DIAGNOSIS — I7789 Other specified disorders of arteries and arterioles: Secondary | ICD-10-CM | POA: Diagnosis not present

## 2022-11-16 DIAGNOSIS — I509 Heart failure, unspecified: Secondary | ICD-10-CM | POA: Diagnosis not present

## 2022-11-16 DIAGNOSIS — Z79899 Other long term (current) drug therapy: Secondary | ICD-10-CM | POA: Diagnosis not present

## 2022-11-16 DIAGNOSIS — K5190 Ulcerative colitis, unspecified, without complications: Secondary | ICD-10-CM | POA: Diagnosis not present

## 2022-11-16 DIAGNOSIS — Z791 Long term (current) use of non-steroidal anti-inflammatories (NSAID): Secondary | ICD-10-CM | POA: Diagnosis not present

## 2022-11-16 DIAGNOSIS — I499 Cardiac arrhythmia, unspecified: Secondary | ICD-10-CM | POA: Diagnosis not present

## 2022-11-16 DIAGNOSIS — I130 Hypertensive heart and chronic kidney disease with heart failure and stage 1 through stage 4 chronic kidney disease, or unspecified chronic kidney disease: Secondary | ICD-10-CM | POA: Diagnosis not present

## 2022-11-16 DIAGNOSIS — J8410 Pulmonary fibrosis, unspecified: Secondary | ICD-10-CM | POA: Diagnosis not present

## 2022-11-16 DIAGNOSIS — E785 Hyperlipidemia, unspecified: Secondary | ICD-10-CM | POA: Diagnosis not present

## 2022-11-16 DIAGNOSIS — Z9981 Dependence on supplemental oxygen: Secondary | ICD-10-CM | POA: Diagnosis not present

## 2022-11-18 ENCOUNTER — Telehealth: Payer: Self-pay | Admitting: Cardiology

## 2022-11-18 DIAGNOSIS — Z6833 Body mass index (BMI) 33.0-33.9, adult: Secondary | ICD-10-CM | POA: Diagnosis not present

## 2022-11-18 DIAGNOSIS — I10 Essential (primary) hypertension: Secondary | ICD-10-CM | POA: Diagnosis not present

## 2022-11-18 DIAGNOSIS — J9611 Chronic respiratory failure with hypoxia: Secondary | ICD-10-CM | POA: Diagnosis not present

## 2022-11-18 DIAGNOSIS — K589 Irritable bowel syndrome without diarrhea: Secondary | ICD-10-CM | POA: Diagnosis not present

## 2022-11-18 DIAGNOSIS — G4733 Obstructive sleep apnea (adult) (pediatric): Secondary | ICD-10-CM | POA: Diagnosis not present

## 2022-11-18 DIAGNOSIS — K219 Gastro-esophageal reflux disease without esophagitis: Secondary | ICD-10-CM | POA: Diagnosis not present

## 2022-11-18 DIAGNOSIS — J9612 Chronic respiratory failure with hypercapnia: Secondary | ICD-10-CM | POA: Diagnosis not present

## 2022-11-18 DIAGNOSIS — I2510 Atherosclerotic heart disease of native coronary artery without angina pectoris: Secondary | ICD-10-CM | POA: Diagnosis not present

## 2022-11-18 DIAGNOSIS — I447 Left bundle-branch block, unspecified: Secondary | ICD-10-CM | POA: Diagnosis not present

## 2022-11-18 NOTE — Telephone Encounter (Signed)
Patient wants to update the physician that he had a heart attack Monday and stent placement on Tuesday by Dr. Atilano Median.  He states he is home and doing well, just tired.  The hospital started him on Lipitor and he took one dose before realizing it was a statin and stopped the medication.  He cannot take statins due to the muscle soreness and his liver issues.  He ask for recommendation in place of this medication. He states they made med changes as well: STOP Metoprolol and Sodium Bicarb START:  ASA '81mg'$     Coreg 3.125 BID,  Ezetimibe '10mg'$  at bedtime,   and Brilinta '90mg'$  BID.

## 2022-11-18 NOTE — Telephone Encounter (Signed)
Patient stated he recently had a stent placed and wants to update Dr. Stanford Breed.

## 2022-11-20 ENCOUNTER — Encounter: Payer: Self-pay | Admitting: Physician Assistant

## 2022-11-20 ENCOUNTER — Ambulatory Visit (INDEPENDENT_AMBULATORY_CARE_PROVIDER_SITE_OTHER): Payer: Medicare HMO | Admitting: Physician Assistant

## 2022-11-20 VITALS — BP 102/51 | HR 89 | Ht 71.0 in | Wt 223.0 lb

## 2022-11-20 DIAGNOSIS — I959 Hypotension, unspecified: Secondary | ICD-10-CM | POA: Diagnosis not present

## 2022-11-20 DIAGNOSIS — T466X5A Adverse effect of antihyperlipidemic and antiarteriosclerotic drugs, initial encounter: Secondary | ICD-10-CM

## 2022-11-20 DIAGNOSIS — G720 Drug-induced myopathy: Secondary | ICD-10-CM | POA: Diagnosis not present

## 2022-11-20 DIAGNOSIS — I2510 Atherosclerotic heart disease of native coronary artery without angina pectoris: Secondary | ICD-10-CM | POA: Insufficient documentation

## 2022-11-20 DIAGNOSIS — I214 Non-ST elevation (NSTEMI) myocardial infarction: Secondary | ICD-10-CM

## 2022-11-20 DIAGNOSIS — Z09 Encounter for follow-up examination after completed treatment for conditions other than malignant neoplasm: Secondary | ICD-10-CM

## 2022-11-20 DIAGNOSIS — E871 Hypo-osmolality and hyponatremia: Secondary | ICD-10-CM

## 2022-11-20 DIAGNOSIS — I447 Left bundle-branch block, unspecified: Secondary | ICD-10-CM | POA: Diagnosis not present

## 2022-11-20 DIAGNOSIS — E782 Mixed hyperlipidemia: Secondary | ICD-10-CM | POA: Diagnosis not present

## 2022-11-20 DIAGNOSIS — Z955 Presence of coronary angioplasty implant and graft: Secondary | ICD-10-CM | POA: Diagnosis not present

## 2022-11-20 MED ORDER — REPATHA 140 MG/ML ~~LOC~~ SOSY: PREFILLED_SYRINGE | SUBCUTANEOUS | 11 refills | Status: DC

## 2022-11-20 NOTE — Progress Notes (Signed)
Established Patient Office Visit  Subjective   Patient ID: Jake [name], male    DOB: [birth year]  Age: 62 y.o. MRN: [id number]  Chief Complaint  Patient presents with   Hospitalization Follow-up    HPI Pt is a 62 yo obese male who presents to the clinic for hospital follow up. He went to the ED on 3/11 by EMS due to CP he was found to have NSTEMI and LBBB and had DES placed in circumflex in cath lab. Negative CTA. He had low sodium at 133 on last blood drawn. He was discharged on 3/13.  He declined statin and ASA due to side effects. He was placed on carvedilol, Zetia, and brinlinta.   He is taking medication and doing ok. He admits to being very tired and SOB. He is wearing his O2 at 2-3L continuously. He has known ILD. Not checking BP at home.   Pt does not have appt to see cardiology at this time.   .. 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[place]) 05/27/2017   History of pancreatitis 05/27/2017   Diverticulitis 05/27/2017   Elevated fasting glucose 06/10/2017   Low testosterone 06/10/2017   Benign prostatic hyperplasia (BPH) with urinary urgency 06/12/2017   No energy 06/12/2017   Former smoker 07/25/2017   Cough 07/25/2017   Low testosterone in male 07/25/2017   Non-restorative sleep 07/25/2017   Anxiety 10/30/2015   Chronic nausea 10/30/2015   Major depressive disorder 10/30/2015   Migraine 10/30/2015   Neck pain 10/30/2015   Occipital neuralgia 10/30/2015   Substance abuse in remission ([place])  04/07/2018   Bipolar disorder ([place]) 07/04/2018   Hearing difficulty of right ear 01/16/2019   IFG (impaired fasting glucose) 03/20/2019   Coronary artery disease due to calcified coronary lesion 11/03/2019   Thoracic aorta atherosclerosis ([place]) 11/03/2019   Aortic atherosclerosis ([place]) 11/03/2019   Hepatic steatosis 11/03/2019   SOB (shortness of breath) on exertion 11/06/2019   Precordial pain 11/06/2019   Essential hypertension 11/06/2019   Obesity (BMI 30-39.9) 11/06/2019   Aneurysm of ascending aorta ([place]) 11/10/2019   Hypoxia 02/21/2020   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/2020   Sinobronchitis 07/10/2020   Decreased cardiac ejection fraction 11/14/2020   Cardiomyopathy ([place]) 11/14/2020   Dyspnea 11/14/2020   Left lower quadrant abdominal pain [id number]   Chronic systolic congestive heart failure, NYHA class 3 ([place]) 05/30/2021   IPF (idiopathic pulmonary fibrosis) ([place]) 06/10/2021   Pre-diabetes 06/10/2021   Class 1 obesity due to excess calories with serious comorbidity and body mass index (BMI) of 31.0 to 31.9 in adult 06/10/2021   Cirrhosis of liver without ascites ([place]) 06/15/2021   Dysfunction of both eustachian tubes 07/08/2021   Eye discharge 08/11/2021   Photophobia of both eyes 08/11/2021   Eye pain, bilateral 08/11/2021   Diarrhea 09/05/2021   Abdominal distention 09/05/2021   Generalized abdominal pain 09/05/2021   Suspicious nevus 11/11/2021   Change in bowel habits    Gastritis and gastroduodenitis    Colon cancer screening    Hematochezia  Grade II internal hemorrhoids    Adenomatous polyp of sigmoid colon    Rotator cuff tendonitis, left 12/24/2021   Sciatica of left side 12/24/2021   ILD (interstitial lung disease) ([place]) 02/09/2022   Chronic respiratory failure with hypoxia (HCC) 02/10/2022   SOB (shortness of breath) 02/10/2022   Left bundle branch block 11/20/2022   H/O heart artery stent 11/20/2022   NSTEMI (non-ST elevated myocardial infarction)  ([place]) 11/20/2022   Hyponatremia 11/20/2022   Coronary artery disease involving native coronary artery of native heart without angina pectoris 11/20/2022   Hypotension 11/20/2022   Statin myopathy 11/20/2022   Resolved Ambulatory Problems    Diagnosis Date Noted   Essential hypertension, benign 05/19/2016   Community acquired pneumonia of left lower lobe of lung 11/20/2016   Impacted cerumen of right ear 01/16/2019   Past Medical History:  Diagnosis Date   Arthritis    Hypertension      ROS See HPI.    Objective:     BP (!) 102/51   Pulse 89   Ht 5\' 11"  (1.803 m)   Wt 223 lb (101.2 kg)   SpO2 98%   BMI 31.10 kg/m  BP Readings from Last 3 Encounters:  11/20/22 (!) 102/51  10/29/22 138/82  10/14/22 113/67   Wt Readings from Last 3 Encounters:  11/20/22 223 lb (101.2 kg)  10/29/22 229 lb 12.8 oz (104.2 kg)  10/14/22 230 lb (104.3 kg)      Physical Exam Constitutional:      Appearance: Normal appearance. He is obese.  HENT:     Head: Normocephalic.  Cardiovascular:     Rate and Rhythm: Normal rate and regular rhythm.     Pulses: Normal pulses.  Pulmonary:     Effort: Pulmonary effort is normal.  Musculoskeletal:     Right lower leg: No edema.     Left lower leg: No edema.  Neurological:     General: No focal deficit present.     Mental Status: He is alert and oriented to person, place, and time.  Psychiatric:        Mood and Affect: Mood normal.       Last CBC Lab Results  Component Value Date   WBC 9.1 07/02/2022   HGB 14.4 07/02/2022   HCT 42.9 07/02/2022   MCV 83.5 07/02/2022   MCH 28.5 09/03/2021   RDW 14.8 07/02/2022   PLT 383.0 [id number]   Last metabolic panel Lab Results  Component Value Date   GLUCOSE 102 (H) 07/02/2022   NA 133 (L) 07/02/2022   K 3.5 07/02/2022   CL 91 (L) 07/02/2022   CO2 33 (H) 07/02/2022   BUN 31 (H) 07/02/2022   CREATININE 1.24 07/02/2022   EGFR 69 09/12/2021   CALCIUM 10.3 07/02/2022   PROT 7.8  07/02/2022   ALBUMIN 5.1 07/02/2022   BILITOT 0.5 07/02/2022   ALKPHOS 44 07/02/2022   AST 18 07/02/2022   ALT 22 07/02/2022   ANIONGAP 10 09/03/2021   Last lipids Lab Results  Component Value Date   CHOL 160 06/09/2021   HDL 40 06/09/2021   LDLCALC 92 06/09/2021   TRIG 184 (H) 06/09/2021   CHOLHDL 4.0 06/09/2021   Last hemoglobin A1c Lab Results  Component Value Date   HGBA1C 5.9 (A) 06/10/2021         Assessment & Plan:  [name] KitchenMarland KitchenFredrico was seen today for hospitalization follow-up.  Diagnoses and all orders for this visit:  Hospital discharge follow-up -  BASIC METABOLIC PANEL WITH GFR  Hyponatremia -     BASIC METABOLIC PANEL WITH GFR  NSTEMI (non-ST elevated myocardial infarction) (HCC) -     Evolocumab (REPATHA) 140 MG/ML SOSY; Inject 60mL [place] every 14 days. -     BASIC METABOLIC PANEL WITH GFR  H/O heart artery stent -     Evolocumab (REPATHA) 140 MG/ML SOSY; Inject 27mL [place] every 14 days. -     BASIC METABOLIC PANEL WITH GFR  Left bundle branch block  Mixed hyperlipidemia -     Evolocumab (REPATHA) 140 MG/ML SOSY; Inject 36mL [place] every 14 days.  Coronary artery disease involving native coronary artery of native heart without angina pectoris -     Evolocumab (REPATHA) 140 MG/ML SOSY; Inject 68mL [place] every 14 days.  Hypotension, unspecified hypotension type  Statin myopathy    Reviewed medications with patient.  Pt is adamant about not being on statin, added myopathy code.  Strongly suggest starting repatha, PSK-9.  Sent to pharmacy to take every 2 weeks.  Low sodium on discharge. BMP to recheck Pt tired and could be multifactorial Could be side effect of carvediol, discussed this should get better Could be post procedure fatigue and worsening heart function Follow up with cardiology for repeat testing(echo etc) BP was low today, discussed proper nutrition and hydration If continues to be under 110/60 then could consider holding finasteride but I would  not stop any of his heart failure or post MI medications HR good today Pulse ox 98 percent on 2L O2.  Will send note to Dr. Stanford Breed to see if can get patient in sooner.   Spent 55 minutes reviewing chart/las/imaging and discussing plan and coordinating care.    [name], PA-C

## 2022-11-20 NOTE — Patient Instructions (Signed)
Evolocumab Injection What is this medication? EVOLOCUMAB (e voe LOK ue mab) treats high cholesterol. It may also be used to lower the risk of heart attack, stroke, and a type of heart surgery. It works by decreasing bad cholesterol (such as LDL) in your blood. It is a monoclonal antibody. Changes to diet and exercise are often combined with this medication. This medicine may be used for other purposes; ask your health care provider or pharmacist if you have questions. COMMON BRAND NAME(S): Repatha, Repatha SureClick What should I tell my care team before I take this medication? They need to know if you have any of these conditions: An unusual or allergic reaction to evolocumab, latex, other medications, foods, dyes, or preservatives Pregnant or trying to get pregnant Breast-feeding How should I use this medication? This medication is injected under the skin. You will be taught how to prepare and give it. Take it as directed on the prescription label at the same time every day. Keep taking it unless your care team tells you to stop. It is important that you put your used needles and syringes in a special sharps container. Do not put them in a trash can. If you do not have a sharps container, call your pharmacist or care team to get one. This medication comes with INSTRUCTIONS FOR USE. Ask your pharmacist for directions on how to use this medication. Read the information carefully. Talk to your pharmacist or care team if you have questions. Talk to your care team about the use of this medication in children. While it may be prescribed for children as young as 10 years for selected conditions, precautions do apply. Overdosage: If you think you have taken too much of this medicine contact a poison control center or emergency room at once. NOTE: This medicine is only for you. Do not share this medicine with others. What if I miss a dose? It is important not to miss any doses. Talk to your care team  about what to do if you miss a dose. What may interact with this medication? Interactions are not expected. This list may not describe all possible interactions. Give your health care provider a list of all the medicines, herbs, non-prescription drugs, or dietary supplements you use. Also tell them if you smoke, drink alcohol, or use illegal drugs. Some items may interact with your medicine. What should I watch for while using this medication? Visit your care team for regular checks on your progress. Tell your care team if your symptoms do not start to get better or if they get worse. You may need blood work while you are taking this medication. Do not wear the on-body infuser during an MRI. Taking this medication is only part of a total heart healthy program. Ask your care team if there are other changes you can make to improve your overall health. What side effects may I notice from receiving this medication? Side effects that you should report to your care team as soon as possible: Allergic reactions or angioedema--skin rash, itching or hives, swelling of the face, eyes, lips, tongue, arms, or legs, trouble swallowing or breathing Side effects that usually do not require medical attention (report to your care team if they continue or are bothersome): Back pain Flu-like symptoms--fever, chills, muscle pain, cough, headache, fatigue Pain, redness, or irritation at injection site Runny or stuffy nose Sore throat This list may not describe all possible side effects. Call your doctor for medical advice about side effects. You may   report side effects to FDA at 1-800-FDA-1088. Where should I keep my medication? Keep out of the reach of children and pets. Store in a refrigerator or at room temperature between 20 and 25 degrees C (68 and 77 degrees F). Refrigeration (preferred): Store it in the refrigerator. Do not freeze. Keep it in the original carton until you are ready to take it. Remove the dose  from the carton about 30 minutes before it is time for you to take it. Get rid of any unused medication after the expiration date. Room temperature: This medication may be stored at room temperature for up to 30 days. Keep it in the original carton until you are ready to take it. If it is stored at room temperature, get rid of any unused medication after 30 days or after it expires, whichever is first. Protect from light. Do not shake. Avoid exposure to extreme heat. To get rid of medications that are no longer needed or have expired: Take the medication to a medication take-back program. Check with your pharmacy or law enforcement to find a location. If you cannot return the medication, ask your pharmacist or care team how to get rid of this medication safely. NOTE: This sheet is a summary. It may not cover all possible information. If you have questions about this medicine, talk to your doctor, pharmacist, or health care provider.  2023 Elsevier/Gold Standard (2021-08-26 00:00:00)  

## 2022-11-21 LAB — BASIC METABOLIC PANEL WITH GFR
BUN/Creatinine Ratio: 23 (calc) — ABNORMAL HIGH (ref 6–22)
BUN: 36 mg/dL — ABNORMAL HIGH (ref 7–25)
CO2: 29 mmol/L (ref 20–32)
Calcium: 10 mg/dL (ref 8.6–10.3)
Chloride: 92 mmol/L — ABNORMAL LOW (ref 98–110)
Creat: 1.55 mg/dL — ABNORMAL HIGH (ref 0.70–1.35)
Glucose, Bld: 90 mg/dL (ref 65–99)
Potassium: 4.1 mmol/L (ref 3.5–5.3)
Sodium: 133 mmol/L — ABNORMAL LOW (ref 135–146)
eGFR: 51 mL/min/{1.73_m2} — ABNORMAL LOW (ref >=60)

## 2022-11-23 ENCOUNTER — Other Ambulatory Visit: Payer: Self-pay | Admitting: Neurology

## 2022-11-23 DIAGNOSIS — N289 Disorder of kidney and ureter, unspecified: Secondary | ICD-10-CM

## 2022-11-23 NOTE — Progress Notes (Unsigned)
HPI: Follow-up nonischemic cardiomyopathy.  CPX March 2022 showed submaximal effort, moderate functional impairment due to pulmonary restriction.  High resolution chest CT March 2022 showed interstitial lung disease and aortic atherosclerosis; enlarged pulmonary trunk suggestive of pulmonary hypertension.  Cardiac catheterization March 2022 showed normal left main, minimal irregularities in the LAD, 2 small diagonals, circumflex, 2 obtuse marginals, posterior lateral, PDA and nondominant right coronary artery; ejection fraction 30 to 35% with mild left ventricular enlargement.  Echocardiogram June 2023 showed ejection fraction 40 to [id number], grade 1 diastolic dysfunction, moderate left atrial enlargement, mild mitral regurgitation.  Right heart catheterization June 2023 showed cardiac output 6.59 L/min, cardiac index 2.96 L/min, RA pressure 11, PA pressure 25/12 and pulmonary capillary wedge pressure 8.  Echocardiogram November 2023 showed ejection fraction 30 to 35% with dyskinetic septum, grade 1 diastolic dysfunction, moderate left atrial enlargement, mild mitral regurgitation.  CTA January 2024 showed pneumonitis and atheromatous changes of the thoracic aorta but no aneurysm.  Patient admitted to [facility] March 2024 with STEMI.  Cardiac catheterization revealed 95 to 99% mid circumflex, 30% LAD, 50% PDA.  Patient had PCI of the left circumflex with drug-eluting stent.  Since last seen he does have some dyspnea on exertion and he uses oxygen for this from pulmonary fibrosis.  He has not had recurrent chest pain.  No syncope.  Current Outpatient Medications  Medication Sig Dispense Refill   albuterol (VENTOLIN HFA) 108 (90 Base) MCG/ACT inhaler TAKE 2 PUFFS BY MOUTH EVERY 6 HOURS AS NEEDED 18 each 3   AMBULATORY NON FORMULARY MEDICATION Continuous positive airway pressure (CPAP) machine set at autotitration of H2O pressure, with all supplemental supplies as needed. 1 each 0   aspirin 81 MG  chewable tablet Chew by mouth.     busPIRone (BUSPAR) 15 MG tablet Take 15 mg by mouth 2 (two) times daily.     carvedilol (COREG) 3.125 MG tablet Take by mouth.     chlorthalidone (HYGROTON) 25 MG tablet TAKE 1 TABLET BY MOUTH EVERY DAY 90 tablet 1   colestipol (COLESTID) 1 g tablet TAKE 1 TABLET BY MOUTH EVERY DAY 90 tablet 1   dicyclomine (BENTYL) 20 MG tablet TAKE 1 TABLET BY MOUTH THREE TIMES A DAY 90 tablet 0   ENTRESTO 49-51 MG Take 1 tablet by mouth 2 (two) times daily.     Evolocumab (REPATHA) 140 MG/ML SOSY Inject 66mL  every 14 days. 2 mL 11   ezetimibe (ZETIA) 10 MG tablet Take 1 tablet by mouth at bedtime.     finasteride (PROSCAR) 5 MG tablet TAKE 1 TABLET (5 MG TOTAL) BY MOUTH DAILY. 30 tablet 5   fluticasone (FLONASE) 50 MCG/ACT nasal spray Place into both nostrils daily.     ibuprofen (ADVIL) 800 MG tablet TAKE 1 TABLET BY MOUTH EVERY 8 HOURS AS NEEDED 90 tablet 5   KLOR-CON M10 10 MEQ tablet TAKE 1 TABLET BY MOUTH TWICE A DAY 60 tablet 1   loratadine (CLARITIN) 10 MG tablet Take 10 mg by mouth in the morning.     Misc. Devices (PULSE OXIMETER) MISC Monitor oxygen three times per day. 1 each 0   modafinil (PROVIGIL) 200 MG tablet TAKE 1 TABLET BY MOUTH EVERY DAY 30 tablet 2   ondansetron (ZOFRAN) 8 MG tablet TAKE 1 TABLET BY MOUTH EVERY 8 HOURS AS NEEDED FOR NAUSEA FOR VOMITING 90 tablet 2   pantoprazole (PROTONIX) 40 MG tablet TAKE 1 TABLET BY MOUTH TWICE A DAY 180  tablet 1   sertraline (ZOLOFT) 100 MG tablet Take 2 tablets (200 mg total) by mouth daily. 180 tablet 1   ticagrelor (BRILINTA) 90 MG TABS tablet Take by mouth.     triamcinolone (NASACORT) 55 MCG/ACT AERO nasal inhaler Place 2 sprays into the nose daily as needed (allergies (when pollen is very high)).     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    Past Surgical History:  Procedure Laterality Date   ANKLE FRACTURE SURGERY Left    APPENDECTOMY     BIOPSY  11/18/2021   Procedure: BIOPSY;  Surgeon: Lavena Bullion, DO;  Location: [facility];  Service: Gastroenterology;;  EGD and COLON   CERVICAL FUSION     CHOLECYSTECTOMY     COLON SURGERY     COLONOSCOPY WITH PROPOFOL N/A 11/18/2021   Procedure: COLONOSCOPY WITH PROPOFOL;  Surgeon: Lavena Bullion, DO;  Location: [facility];  Service: Gastroenterology;  Laterality: N/A;   ESOPHAGOGASTRODUODENOSCOPY (EGD) WITH PROPOFOL N/A 11/18/2021   Procedure: ESOPHAGOGASTRODUODENOSCOPY (EGD) WITH PROPOFOL;  Surgeon: Lavena Bullion, DO;  Location: [facility];  Service: Gastroenterology;  Laterality: N/A;   HERNIA REPAIR     POLYPECTOMY  11/18/2021   Procedure: POLYPECTOMY;  Surgeon: Lavena Bullion, DO;  Location: [facility];  Service: Gastroenterology;;   RIGHT HEART CATH N/A 02/13/2022   Procedure: RIGHT HEART CATH;  Surgeon: Burnell Blanks, MD;  Location: [place] [facility];  Service: Cardiovascular;  Laterality: N/A;    Social History   Socioeconomic History   Marital status: Married    Spouse name: Dawn   Number of children: 1   Years of education: 14   Highest education level: Some college, no degree  Occupational History   Occupation: Disabled  Tobacco Use   Smoking status: Former    Packs/day: 1.00    Years: 28.00    Additional pack years: 0.00    Total pack years: 28.00    Types: Cigarettes    Start date: 1982    Quit date: 2010    Years since quitting: 14.2   Smokeless tobacco: Never  Vaping Use   Vaping Use: Never used  Substance and Sexual Activity   Alcohol use: No   Drug use: No    Comment:  04/06/2018 Patient's wife wanted [place] to be very aware that her husband is a recovering narcotic abuser   Sexual activity: Yes  Other Topics Concern   Not on file  Social History Narrative   Lives with his wife, daughter, son-in-law and 2 two grandchildren. Likes to spend to time with grandchildren. Does not exercise due to back issues in the past and respiratory problems.   Social Determinants of Health   Financial Resource Strain: Low Risk  (02/04/2022)   Overall Financial Resource Strain (CARDIA)    Difficulty of Paying Living Expenses: Not hard at all  Food Insecurity: No Food Insecurity (02/04/2022)   Hunger Vital Sign    Worried About Running Out of Food in the Last Year: Never  true    Ran Out of Food in the Last Year: Never true  Transportation Needs: No Transportation Needs (02/04/2022)   PRAPARE - Hydrologist (Medical): No    Lack of Transportation (Non-Medical): No  Physical Activity: Inactive (02/04/2022)   Exercise Vital Sign    Days of Exercise per Week: 0 days    Minutes of Exercise per Session: 0 min  Stress: No Stress Concern Present (02/04/2022)   [place]    Feeling of Stress : Not at all  Social Connections: Moderately Integrated (02/04/2022)   Social Connection and Isolation Panel [NHANES]    Frequency of Communication with Friends and Family: Once a week    Frequency of Social Gatherings with Friends and Family: More than three times a week    Attends Religious Services: More than 4 times per year    Active Member of [employer] or Organizations: No    Attends Archivist Meetings: Never    Marital Status: Married  Human resources officer Violence: Not At Risk (02/04/2022)   Humiliation, Afraid, Rape, and Kick questionnaire    Fear of Current or Ex-Partner: No    Emotionally Abused: No    Physically Abused: No    Sexually Abused: No    Family History  Problem Relation  Age of Onset   Cancer Mother        breast   Depression Mother    Hyperlipidemia Mother    Dementia Mother    Alcohol abuse Father    Cancer Father        skin   Hyperlipidemia Father    Alzheimer's disease Father    Cancer Maternal Grandfather    Cancer Paternal Grandmother    Pancreatic cancer Paternal Uncle    Colon cancer Neg Hx    Esophageal cancer Neg Hx    Stomach cancer Neg Hx     ROS: no fevers or chills, productive cough, hemoptysis, dysphasia, odynophagia, melena, hematochezia, dysuria, hematuria, rash, seizure activity, orthopnea, PND, pedal edema, claudication. Remaining systems are negative.  Physical Exam: Well-developed well-nourished in no acute distress.  Skin is warm and dry.  HEENT is normal.  Neck is supple.  Chest is clear to auscultation with normal expansion.  Cardiovascular exam is regular rate and rhythm.  Abdominal exam nontender or distended. No masses palpated. Extremities show no edema. neuro grossly intact  ECG-normal sinus rhythm at a rate of 74, left bundle branch block.  Personally reviewed  A/P  1 coronary artery disease-status post recent myocardial infarction with PCI of left circumflex-continue aspirin and Brilinta.  Intolerant to statins.  2 history of nonischemic cardiomyopathy-continue Entresto and Toprol.  Will plan to repeat echocardiogram following his recent myocardial infarction in 3 months.  If ejection fraction less than 35% will need to consider ICD.  3 chronic combined systolic/diastolic congestive heart failure-patient remains euvolemic.  Continue diuretic at present dose.  He previously declined SGLT2 inhibitor.  4 hypertension-blood pressure controlled.  Continue present medications.  5 hyperlipidemia-intolerant to statins.  He is unclear about his cholesterol medications.  We will have him contact us with that and we will check lipids and liver.  Our goal LDL will be less than 55.  6 prior syncopal episode-patient has  had no recurrences.  7 history of dilated thoracic aorta-not evident on most recent CTA.  8 sleep apnea-continue CPAP.  9 interstitial lung disease-Per pulmonary.  Kirk Ruths, MD

## 2022-11-23 NOTE — Progress Notes (Signed)
Sodium is low but stable.  Kidneys look dry. Make sure you are drinking enough water.  Creatinine is up and GFR is down.  Increase hydration and recheck Thursday or Friday with bmp.

## 2022-11-23 NOTE — Telephone Encounter (Signed)
Follow up scheduled

## 2022-11-25 ENCOUNTER — Ambulatory Visit: Payer: Medicare HMO | Attending: Cardiology | Admitting: Cardiology

## 2022-11-25 ENCOUNTER — Encounter: Payer: Self-pay | Admitting: Cardiology

## 2022-11-25 VITALS — BP 124/74 | HR 74 | Ht 71.0 in | Wt 226.1 lb

## 2022-11-25 DIAGNOSIS — I420 Dilated cardiomyopathy: Secondary | ICD-10-CM | POA: Diagnosis not present

## 2022-11-25 DIAGNOSIS — E782 Mixed hyperlipidemia: Secondary | ICD-10-CM

## 2022-11-25 NOTE — Patient Instructions (Signed)
  Testing/Procedures:  Your physician has requested that you have an echocardiogram. Echocardiography is a painless test that uses sound waves to create images of your heart. It provides your doctor with information about the size and shape of your heart and how well your heart's chambers and valves are working. This procedure takes approximately one hour. There are no restrictions for this procedure. Please do NOT wear cologne, perfume, aftershave, or lotions (deodorant is allowed). Please arrive 15 minutes prior to your appointment time. [facility]-1 ST FLOOR IMAGING DEPARTMENT-SCHEDULE IN 3 MONTHS   Follow-Up: At [facility], you and your health needs are our priority.  As part of our continuing mission to provide you with exceptional heart care, we have created designated Provider Care Teams.  These Care Teams include your primary Cardiologist (physician) and Advanced Practice Providers (APPs -  Physician Assistants and Nurse Practitioners) who all work together to provide you with the care you need, when you need it.  We recommend signing up for the patient portal called "MyChart".  Sign up information is provided on this After Visit Summary.  MyChart is used to connect with patients for Virtual Visits (Telemedicine).  Patients are able to view lab/test results, encounter notes, upcoming appointments, etc.  Non-urgent messages can be sent to your provider as well.   To learn more about what you can do with MyChart, go to [email or web address].    Your next appointment:   3 month(s)  Provider:   Kirk Ruths, MD

## 2022-11-26 DIAGNOSIS — J849 Interstitial pulmonary disease, unspecified: Secondary | ICD-10-CM | POA: Diagnosis not present

## 2022-11-27 DIAGNOSIS — N289 Disorder of kidney and ureter, unspecified: Secondary | ICD-10-CM | POA: Diagnosis not present

## 2022-11-27 DIAGNOSIS — E782 Mixed hyperlipidemia: Secondary | ICD-10-CM | POA: Diagnosis not present

## 2022-11-28 ENCOUNTER — Other Ambulatory Visit: Payer: Self-pay | Admitting: Physician Assistant

## 2022-11-28 DIAGNOSIS — N401 Enlarged prostate with lower urinary tract symptoms: Secondary | ICD-10-CM

## 2022-11-28 LAB — LIPID PANEL
Chol/HDL Ratio: 4.3 ratio (ref 0.0–5.0)
Cholesterol, Total: 166 mg/dL (ref 100–199)
HDL: 39 mg/dL — ABNORMAL LOW (ref >39)
LDL Chol Calc (NIH): 97 mg/dL (ref 0–99)
Triglycerides: 174 mg/dL — ABNORMAL HIGH (ref 0–149)
VLDL Cholesterol Cal: 30 mg/dL (ref 5–40)

## 2022-11-28 LAB — HEPATIC FUNCTION PANEL
ALT: 30 IU/L (ref 0–44)
AST: 29 IU/L (ref 0–40)
Albumin: 4.7 g/dL (ref 3.9–4.9)
Alkaline Phosphatase: 66 IU/L (ref 44–121)
Bilirubin Total: 0.2 mg/dL (ref 0.0–1.2)
Bilirubin, Direct: <0.1 mg/dL (ref 0.00–0.40)
Total Protein: 7.2 g/dL (ref 6.0–8.5)

## 2022-11-28 LAB — BASIC METABOLIC PANEL
BUN: 25 mg/dL (ref 7–25)
CO2: 31 mmol/L (ref 20–32)
Calcium: 9.8 mg/dL (ref 8.6–10.3)
Chloride: 94 mmol/L — ABNORMAL LOW (ref 98–110)
Creat: 1.29 mg/dL (ref 0.70–1.35)
Glucose, Bld: 75 mg/dL (ref 65–99)
Potassium: 4 mmol/L (ref 3.5–5.3)
Sodium: 134 mmol/L — ABNORMAL LOW (ref 135–146)

## 2022-11-30 ENCOUNTER — Telehealth: Payer: Self-pay | Admitting: Cardiology

## 2022-11-30 ENCOUNTER — Other Ambulatory Visit: Payer: Self-pay | Admitting: *Deleted

## 2022-11-30 DIAGNOSIS — E782 Mixed hyperlipidemia: Secondary | ICD-10-CM

## 2022-11-30 NOTE — Telephone Encounter (Signed)
Spoke with patient of Dr. Stanford Breed. He reports his carvedilol was increased to 6.25mg  twice daily -- this is not on med list this way or in instructions. He has felt anxious and panic attack symptoms after that change. He is aware that Brilinta can cause some transient SOB. He reports he is better in the evenings, as he has had caffeine, eaten, and taken sertraline before the PM dose. He does not check BP/pulse at home.   Will send to MD to review.

## 2022-11-30 NOTE — Telephone Encounter (Signed)
Pt c/o medication issue:  1. Name of Medication: Carvedilol,Brilinta and  Ezetimibe  2. How are you currently taking this medication (dosage and times per day)?   3. Are you having a reaction (difficulty breathing--STAT)?   4. What is your medication issue? Anxiety and panic attack after  taking medicine  morning medicine- it lasts for 3 to 4 hrs- he does not which ine is causing this- he takes these at night- hae some, but not as bad as the morning episode

## 2022-11-30 NOTE — Telephone Encounter (Signed)
Left message to call back  

## 2022-11-30 NOTE — Telephone Encounter (Signed)
Pt is returning call.  

## 2022-11-30 NOTE — Telephone Encounter (Signed)
Patient states understanding of continuing medication.  He will try for another week and see if this helps.  He states he will keep a log of BP.  He states drinking caffeine to help with SOB while taking Brilinta. Advised to cut back slightly on caffeine to see if this helps with the anxiety. If it helps and does not cause return of SOB then continue less.  If he feels the SOB returns, then start back with the same amount of caffeine he is currently taking. He will call back in a week or sooner if needs more assistance

## 2022-11-30 NOTE — Progress Notes (Signed)
Kidney function much better. BUN better. Kidneys still appear a little dry.

## 2022-11-30 NOTE — Telephone Encounter (Signed)
[name], MD  You12 minutes ago (1:33 PM)    Continue coreg at that dose for now; can reduce later if symptoms dont improve Jake [name]

## 2022-12-03 ENCOUNTER — Other Ambulatory Visit: Payer: Self-pay | Admitting: Physician Assistant

## 2022-12-03 DIAGNOSIS — K580 Irritable bowel syndrome with diarrhea: Secondary | ICD-10-CM

## 2022-12-08 ENCOUNTER — Telehealth: Payer: Self-pay | Admitting: Cardiology

## 2022-12-08 ENCOUNTER — Other Ambulatory Visit: Payer: Self-pay

## 2022-12-08 MED ORDER — METOPROLOL SUCCINATE ER 25 MG PO TB24: 12.5000 mg | ORAL_TABLET | Freq: Every day | ORAL | 1 refills | Status: DC

## 2022-12-08 NOTE — Telephone Encounter (Signed)
  Pt c/o medication issue:  1. Name of Medication:   carvedilol (COREG) 3.125 MG tablet    2. How are you currently taking this medication (dosage and times per day)? As written  3. Are you having a reaction (difficulty breathing--STAT)?   4. What is your medication issue? Pt is calling back, he said, his reaction to carvedilol is getting worst specially the dose he takes in the morning. He gets panic attacks, then he gets SOB and nausea that causes more panic attacks. He gave his BP readings as well:  03.26  138/98 03.27  143/79 AM            169/78 PM  03.28  126/68 AM            140/73 PM  03.29  119/67 AM             143/79 PM 03.30  149/61 AM            139/69 PM 03.31  136/71 04.01  129/77 AM            146/78 PM Today  113/70

## 2022-12-08 NOTE — Telephone Encounter (Signed)
Called patient, advised that he was still not improving on the carvedilol. Patient states he wanted to make Dr.Crenshaw aware from previous messages. He would like to know what to do.   No improvement on the panic attacks, sob worsening, nausea.   Will route to MD.  Thanks!

## 2022-12-08 NOTE — Telephone Encounter (Signed)
Called patient, advised of response from MD.  Patient verbalized understanding.   He will not take the Carvedilol dose this evening, and will start Metoprolol 12.5 mg daily tomorrow.   Patient thankful for call back

## 2022-12-11 ENCOUNTER — Other Ambulatory Visit: Payer: Self-pay | Admitting: Physician Assistant

## 2022-12-11 DIAGNOSIS — R110 Nausea: Secondary | ICD-10-CM

## 2022-12-11 DIAGNOSIS — I10 Essential (primary) hypertension: Secondary | ICD-10-CM

## 2022-12-22 ENCOUNTER — Telehealth: Payer: Self-pay | Admitting: Internal Medicine

## 2022-12-22 NOTE — Telephone Encounter (Signed)
Please see message from [place], front staff and advise.

## 2022-12-22 NOTE — Telephone Encounter (Signed)
If his breathing is stable and if he can wait till January 05, 2023 to see me that is fine but otherwise he should see nurse practitioner in 1 week from now

## 2022-12-22 NOTE — Telephone Encounter (Signed)
PT just released from Hospital for a Heart Attack and stint insertion. No HFU was recommended by care team but PT wants to rev new medications w/Dr. That were issued to him; some of which cause SOB, he said.   I made 15 min appt for him, Dr.'s soonest avail but I let PT know if Dr. Dewayne Hatch to see him sooner, perhaps not at all, or for a 30 minute appt we would call him to make any adjustments.   Please ask Dr. Elvera Lennox. TY.

## 2022-12-22 NOTE — Telephone Encounter (Signed)
Called and left message for patient that MR will see him 4/30. Nothing further needed

## 2022-12-27 DIAGNOSIS — J849 Interstitial pulmonary disease, unspecified: Secondary | ICD-10-CM | POA: Diagnosis not present

## 2022-12-28 ENCOUNTER — Telehealth: Payer: Self-pay | Admitting: Cardiology

## 2022-12-28 MED ORDER — CLOPIDOGREL BISULFATE 75 MG PO TABS: 75.0000 mg | ORAL_TABLET | Freq: Every day | ORAL | 3 refills | Status: DC

## 2022-12-28 NOTE — Telephone Encounter (Signed)
Patient states that has increased SOB.  He does have this normally but lately has increased SOB with exertion and while on O2.  He states pulse ox is not changed  92-93, even though he feels more SOB. Started noticing after starting the Zetia and Brilinta. States exhausted all the time. It is worse the morning, but not much energy in the day or evening either Joint pain and hands go numb as well. He states he is just not the person he usually is and can't continue this way. Wife then gets on the phone, saying his meds are "not jiving.  His whole personality is different". States he is "angry and has short fuse".  She states this is not normal. She is not sure if it is two of the medications.   Took first dose of Repatha on the 19th.  The other medications started approx. 1 month ago.  The metoprolol was changed from Coreg on 12/08/22. Advised would send information to provider to review and for recommendations. F/U is 03/03/23

## 2022-12-28 NOTE — Telephone Encounter (Signed)
Spoke with pt, Aware of dr crenshaw's recommendations.  New script sent to the pharmacy  Follow up scheduled  

## 2022-12-28 NOTE — Telephone Encounter (Signed)
Pt c/o medication issue:  1. Name of Medication: ezetimibe (ZETIA) 10 MG tablet  metoprolol succinate (TOPROL XL) 25 MG 24 hr tablet  ticagrelor (BRILINTA) 90 MG TABS tablet  Evolocumab (REPATHA) 140 MG/ML SOSY   2. How are you currently taking this medication (dosage and times per day)?   Take 1 tablet by mouth at bedtime.  Take 0.5 tablets (12.5 mg total) by mouth daily.  Take by mouth.  Inject 1mL [place] every 14 days.    3. Are you having a reaction (difficulty breathing--STAT)? no  4. What is your medication issue? SOB but he states he already has SOB he is on oxygen, is having severe fatigue, tiredness, numbness in both hands and right leg, nausea, making his anxiety worse. He wants to know if these symptoms will get better. He is not sure which medication is causing this.

## 2022-12-29 ENCOUNTER — Other Ambulatory Visit: Payer: Self-pay | Admitting: Psychiatry

## 2022-12-29 DIAGNOSIS — F39 Unspecified mood [affective] disorder: Secondary | ICD-10-CM

## 2022-12-29 DIAGNOSIS — F411 Generalized anxiety disorder: Secondary | ICD-10-CM

## 2023-01-01 ENCOUNTER — Encounter: Payer: Self-pay | Admitting: Student

## 2023-01-01 ENCOUNTER — Ambulatory Visit: Payer: Medicare HMO | Attending: Student | Admitting: Student

## 2023-01-01 VITALS — BP 125/80 | HR 100 | Ht 71.0 in | Wt 225.6 lb

## 2023-01-01 DIAGNOSIS — E785 Hyperlipidemia, unspecified: Secondary | ICD-10-CM

## 2023-01-01 DIAGNOSIS — I2510 Atherosclerotic heart disease of native coronary artery without angina pectoris: Secondary | ICD-10-CM

## 2023-01-01 DIAGNOSIS — I5022 Chronic systolic (congestive) heart failure: Secondary | ICD-10-CM

## 2023-01-01 DIAGNOSIS — I10 Essential (primary) hypertension: Secondary | ICD-10-CM

## 2023-01-01 NOTE — Patient Instructions (Signed)
Medication Instructions:  Your physician recommends that you continue on your current medications as directed. Please refer to the Current Medication list given to you today.  *If you need a refill on your cardiac medications before your next appointment, please call your pharmacy*   Lab Work: NONE If you have labs (blood work) drawn today and your tests are completely normal, you will receive your results only by: MyChart Message (if you have MyChart) OR A paper copy in the mail If you have any lab test that is abnormal or we need to change your treatment, we will call you to review the results.   Testing/Procedures: Your physician has requested that you have an echocardiogram in MID JUNE. Echocardiography is a painless test that uses sound waves to create images of your heart. It provides your doctor with information about the size and shape of your heart and how well your heart's chambers and valves are working. This procedure takes approximately one hour. There are no restrictions for this procedure. Please do NOT wear cologne, perfume, aftershave, or lotions (deodorant is allowed). Please arrive 15 minutes prior to your appointment time.    Follow-Up: At [facility], you and your health needs are our priority.  As part of our continuing mission to provide you with exceptional heart care, we have created designated Provider Care Teams.  These Care Teams include your primary Cardiologist (physician) and Advanced Practice Providers (APPs -  Physician Assistants and Nurse Practitioners) who all work together to provide you with the care you need, when you need it.  We recommend signing up for the patient portal called "MyChart".  Sign up information is provided on this After Visit Summary.  MyChart is used to connect with patients for Virtual Visits (Telemedicine).  Patients are able to view lab/test results, encounter notes, upcoming appointments, etc.  Non-urgent messages can be  sent to your provider as well.   To learn more about what you can do with MyChart, go to [email or web address].    Your next appointment:   3-4 month(s)  Provider:   Olga Millers, MD

## 2023-01-01 NOTE — Progress Notes (Signed)
Cardiology Clinic Note   Date: 01/01/2023 ID: [name], DOB [birth year], MRN [id number]  Primary Cardiologist:  Olga Millers, MD  Patient Profile    Jake [name] is a 62 y.o. male who presents to the clinic today for follow-up after medication changes.   Past medical history significant for: CAD. LHC 11/17/2022 (NSTEMI) performed at [facility]: PCI with DES to LCx. Ascending thoracic aortic aneurysm. CTA chest aorta 09/21/2022: No evidence of thoracic aortic aneurysm or dissection.  Proximal branches arterial prominence consistent with pulmonary arterial hypertension. Chronic systolic heart failure. Echo 08/05/2022: EF 30 to 35%.  Grade I DD.  Moderate LAE.  Mild MR. LBBB. Hypertension. Hyperlipidemia. Lipid panel 11/27/2022: LDL 97, HDL 39, TG 174, total 166. OSA. Interstitial lung disease. GERD. Syncope.   History of Present Illness    Jake [name] was first evaluated by Dr. Servando Salina on 11/06/2019 for chest pain at the request of [facility], PA-C.  Echo showed normal LV function with mild LVH, dilatation of ascending aorta 40 mm.  Nuclear stress test was a normal low risk study.  Patient transferred care to Dr. Jens Som on 09/29/2021 with complaints of DOE and questionable orthopnea as well as intermittent chest pressure.  Plan for pulmonology follow-up followed by possible right heart catheterization.  RHC showed normal right heart pressures.  Patient was last seen in the office by Dr. Jens Som on 11/25/2022 following PCI with DES to LCx performed at [facility].  Patient complained of DOE. Plan to repeat echo in June to determine if ICD may be indicated. Patient has previously declined SGLT2.  More recently patient called into triage with complaints of increased DOE: "Patient states that has increased SOB.  He does have this normally but lately has increased SOB with exertion and while on O2.  He states pulse ox is not changed  92-93, even though he feels more SOB.  Started noticing after starting the Zetia and Brilinta. States exhausted all the time. It is worse the morning, but not much energy in the day or evening either Joint pain and hands go numb as well. He states he is just not the person he usually is and can't continue this way. Wife then gets on the phone, saying his meds are "not jiving.  His whole personality is different". States he is "angry and has short fuse".  She states this is not normal. She is not sure if it is two of the medications. Took first dose of Repatha on the 19th.  The other medications started approx. 1 month ago.  The metoprolol was changed from Coreg on 12/08/22. Advised would send information to provider to review and for recommendations."  Per Dr. Jens Som: "Ok to DC brilinta and begin plavix 75 mg daily the next day. DC toprol; can schedule fuov with me or APP."  Today, patient reports he is greatly improved since switching to Plavix and stopping Toprol. He reports he is back to baseline dyspnea from his lung disease well-managed with supplemental O2 as needed.  Patient denies chest pain, lower extremity edema, orthopnea, PND.  He continues with some mild fatigue but that seems to be slowly improving.   ROS: All other systems reviewed and are otherwise negative except as noted in History of Present Illness.  Studies Reviewed    ECG is not ordered today.        Physical Exam    VS:  BP 125/80   Pulse 100   Ht 5\' 11"  (1.803 m)   Wt  225 lb 9.6 oz (102.3 kg)   SpO2 98%   BMI 31.46 kg/m  , BMI Body mass index is 31.46 kg/m.  GEN: Well nourished, well developed, in no acute distress. Neck: No JVD or carotid bruits. Cardiac:  RRR. No murmurs. No rubs or gallops.   Respiratory:  Respirations regular and unlabored. Clear to auscultation without rales, wheezing or rhonchi. GI: Soft, nontender, nondistended. Extremities: Radials/DP/PT 2+ and equal bilaterally. No clubbing or cyanosis. No edema.  Skin: Warm and dry, no  rash. Neuro: Strength intact.  Assessment & Plan   CAD.  S/p PCI with DES to LCx performed at [facility] March 2024.  Patient had increased DOE and fatigue.  Dr. Jens Som changed Brilinta to Plavix and stop Toprol.  Today he reports he is much improved and back to his baseline DOE from lung disease well-managed with supplemental O2 as needed.  He denies chest pain, tightness, pressure.  Continue aspirin, Plavix, Zetia, Repatha. Chronic systolic heart failure.  Echo November 2023 showed EF 30 to 35%.  Patient denies lower extremity edema, orthopnea, PND.  DOE is at baseline. Euvolemic and well compensated on exam.  Continue chlorthalidone, Entresto.  Repeat echo in June. Hypertension.  BP today 125/80.  Patient denies headaches or dizziness.  Continue chlorthalidone, Entresto. Hyperlipidemia.  LDL November 25 2495, not at goal.  Patient recently started on Repatha.  Disposition: Echo in June.  Return for previously scheduled visit with Dr. Jens Som or sooner as needed.         Signed, Etta Grandchild. Biran Mayberry, DNP, NP-C

## 2023-01-04 ENCOUNTER — Other Ambulatory Visit: Payer: Self-pay | Admitting: Physician Assistant

## 2023-01-04 DIAGNOSIS — K580 Irritable bowel syndrome with diarrhea: Secondary | ICD-10-CM

## 2023-01-05 ENCOUNTER — Ambulatory Visit (INDEPENDENT_AMBULATORY_CARE_PROVIDER_SITE_OTHER): Payer: Medicare HMO | Admitting: Internal Medicine

## 2023-01-05 ENCOUNTER — Encounter: Payer: Self-pay | Admitting: Internal Medicine

## 2023-01-05 VITALS — BP 120/80 | Ht 71.0 in | Wt 228.6 lb

## 2023-01-05 DIAGNOSIS — J9611 Chronic respiratory failure with hypoxia: Secondary | ICD-10-CM | POA: Diagnosis not present

## 2023-01-05 DIAGNOSIS — J849 Interstitial pulmonary disease, unspecified: Secondary | ICD-10-CM

## 2023-01-05 DIAGNOSIS — R9431 Abnormal electrocardiogram [ECG] [EKG]: Secondary | ICD-10-CM

## 2023-01-05 DIAGNOSIS — J302 Other seasonal allergic rhinitis: Secondary | ICD-10-CM | POA: Diagnosis not present

## 2023-01-05 DIAGNOSIS — I5022 Chronic systolic (congestive) heart failure: Secondary | ICD-10-CM | POA: Diagnosis not present

## 2023-01-05 NOTE — Progress Notes (Signed)
OV 02/03/2022 -transfer of care to Dr. Marchelle Gearing at the ILD clinic by Dr. Val Eagle  Subjective:  Patient ID: Jake [name], male , DOB: 12/29/1960 , age 62 y.o. , MRN: [id number] , ADDRESS: [address] [place] [address] PCP Jomarie Longs, PA-C Patient Care Team: Nolene Ebbs as PCP - General (Family Medicine) Thomasene Ripple, DO as PCP - Cardiology (Cardiology) Fran Lowes, PA-C as Referring Physician (Pulmonary Disease) Gabriel Carina, [facility] as Pharmacist (Pharmacist)  This Provider for this visit: Treatment Team:  Attending Provider: Kalman Shan, MD    02/03/2022 -   Chief Complaint  Patient presents with   Follow-up    Pt switching from Dr. Wynona Neat to MR for ILD eval. Pt states lately he has had problems of SOB that can happen at any time and also has a chronic cough.     HPI Jake [name] 62 y.o. -history is provided by the wife, patient and also reviewed the medical records and the ILD questionnaire.  He has had insidious onset of shortness of breath for the last few years.  Chart review it appears that in 2020 when he got his respiratory infection [in retrospect he thinks this was COVID before the formal declaration of the pandemic] and since then he has been having progressive shortness of breath.  Especially over the last year or so his progression is worse..  For the last 1 year has been on oxygen 2 L.  He feels he haseven progressed more in the last few months.  And is now needing oxygen 4 L at rest at all the time.  Sometimes it is 3 L.  Wife says he easily desaturates into the 58s and occasionally into the 73s when he exerts.  When he does the bed or showers or does chores increases to 4 L.  He feels a portable oxygen is not helping him anymore.  Particularly more progressive in the last few months.  Recently was tried on prednisone.  He is not sure if it helped but it made him very angry had irate emotional outburst and he could not take  prednisone anymore.  Through all this late last year in 2022 he was off his Entresto for heart failure.  This made the shortness of breath worse but then he went back on it in January and he got better but nevertheless overall progressive dyspnea.     [facility] Comprehensive ILD Questionnaire  Symptoms:     Past Medical History :  Status post CABG in 2004.  He also had cardiac stents approximately 6-12 months ago according to his history with 1 repair of the stent.  Still it did not help the shortness of breath.  He has had COVID at least 3 times.  The first 1 was in January 2020 right before the onset of the pandemic.  The second 1 was in January 2020 and the third was in December 2022 during small bowel obstruction admission.  This was an incidental finding according to the wife  He has chronic systolic heart failure  Other issues include nausea, irritable bowel syndrome, acid reflux back and neck pain  He has obstructive sleep apnea  He has a history of pneumonia several years ago  ROS:  -Positive for fatigue for the last several months, arthralgia for the last several years, dysphagia for the last several decades, dry eyes for the last several months, nausea for the last several years, acid reflux for  the last several years also snoring for the last several years  FAMILY HISTORY of LUNG DISEASE:  Negative for any lung disease  PERSONAL EXPOSURE HISTORY:  -Did smoke in the remote past and quit -Smoked between 1985 and 2008 1 and half packs per day. -Possible cocaine use between 1991 and marijuana use between 1978 and 2008 ": Very little to none   HOME  EXPOSURE and HOBBY DETAILS :  -20 years ago he lived in a house that had black mold he lived there for few years but has not been exposed to that for 20 years.  -Was using a Phillips responding CPAP machine for a few years.  It was subject to recall because of foam silicone  contamination issues that caused  respiratory complications.  He stopped using this in 2021 shortly after the recall was issued.  -He is now living in his daughter's house for the last 3 months.  -The old house did have mold.  It also had mildew in the shower curtain. 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  Restrictive pattern with mild desaturations.  -Echocardiogram March 2022 ` -Ejection fraction 30-35%, grade 1 diastolic dysfunction  - BNP May 2023  - 31  - RHC June 2023  -  Nelva Bush RHC   Echo 02/12/22: EF 40-45%, mild MR. LA moderately dilated. RV size and function normal. GRade 1 DD. RHC 02/12/22: RA- 5 mmHg; PA 25/12 mmHg, mean PAP 17 mmHg, Wedge 8 mmHg, CO (fick) 6.59 L/mim, CI-2.96 L/min meter2.   03/12/2022: Today - follow up Patient presents today with wife after being started on Ofev. He is having significant nausea with his first dose of Ofev. He wakes up in the morning, takes his zofran and eats a small snack. He then takes his Ofev at 8 am with food as well, and has tried multiple different food options. He then becomes extremely nauseated around 845 and has to go lay down until he is able to take his second dose of Zofran around 1/2 pm. He finally begins to feel better and is able to tolerate his second dose. He is understandably  frustrated by this and does not wish to have this poor quality of life from his medications. He denies any vomiting or diarrhea. He has had some weight gain as he has had to eat so often with the Ofev to try to curve his nausea.   From a respiratory standpoint, he continues to get winded with minimal exertion. He can be changing the bed at home or walk 50 ft to the mailbox and has to sit down afterwards to rest and catch his breath. He has also had drops in his oxygen into the 70's-80's despite his supplemental 4 lpm so he has been minimally active because of this. No significant cough or wheezing. He has not noticed any difference in  his activity tolerance or lung function since starting the Ofev, but has felt so terrible that he's not sure if he would notice much.           OV 05/05/2022  Subjective:  Patient ID: Jake [name], male , DOB: 1961/02/04 , age 35 y.o. , MRN: [id number] , ADDRESS: [address] [place] [address] PCP Jomarie Longs, PA-C Patient Care Team: Nolene Ebbs as PCP - General (Family Medicine) Thomasene Ripple, DO as PCP - Cardiology (Cardiology) Fran Lowes, PA-C as Referring Physician (Pulmonary Disease) Gabriel Carina, [facility] as Pharmacist (Pharmacist)  This Provider for this visit: Treatment Team:  Attending Provider: Kalman Shan, MD    05/05/2022 -   Chief Complaint  Patient presents with   Follow-up    Pt states he feels like his breathing has become worse since last visit. Also states he has had a dry cough, chest discomfort, and has been having GI issues due to the Esbriet.     HPI Jake [name] 62 y.o. --presents for follow-up.  Presents with his wife.  He has non-- IPF progressive phenotype ILD.  Suspected etiology usage of CPAP machine Philips Respironics.  He tried nintedanib but he had significant intolerance.  He is currently on fourth week of pirfenidone at full dose for the last 1 week.  He is having significant  diarrhea nausea and fatigue because of this.  At 2 pills 3 times daily it was more tolerable.  He is willing to go back to 2 pills 3 times daily and try.  He also significant anxiety and depression.  He is on Zoloft.  In the past he has tried SNRI or some other agent according to the wife but he had side effects.  I have recommended they talk with the primary care physician about this.  He is also trying to lose weight.  He has been prescribed Wegovy but there is no stock available.  He has been looked at [facility] for lung transplant but given his low EF he has been disqualified and they do not want to see him.  He did have a right heart catheterization in June 2023 and this was normal.  According to him after some delay from our office he has been referred to Drinda Butts at [facility] for lung transplant evaluation.  But he has not heard back from them.  I personally emailed Dr. Lendon Collar office.  Also he tells me that with 4 L nasal cannula at rest he is fine but when he does any laundry he desaturates into the 80s.  He has tried increasing to 5 L nasal cannula.  I advised that he could increase it further and keep his pulse ox over 86% and monitor it.  He is willing to do that.  They are not interested in immunomodulators right now because of young kids in the house.    Overall he and his wife are worried that he is declining.  He did have pulmonary function test today and?  Slight decline compared to May 2023.[name] Kitchen  He had liver function test monitoring blood work yesterday but so far the results are not back yet.   SUMMARY VISIT Dr Harrold Donath 06/09/22 at [facility]   He also agrees that patient does not have IPF.  CT scan is not consistent with UIP.  Currently pirfenidone is causing too many side effects.  He wants to stop it.  Because there is more suggestion of valvulitis he wants  to start CellCept.  He is going to start the patient on 500 mg twice daily today.  Patient seeing me in approximately a  month from now.  At the time I will increase to a total of 1 g twice daily.   He also wants the patient referred to pulmonary rehabilitation.   In terms of lung transplantation a lot of relative contraindications including weight and mild cardiomyopathy and intolerance to steroids.  At this point in time he wants patient to be more active and get into rehab  Of note patient only desaturated to 88% on room air and that walk test today   Plan  - Get patient in for CBC chemistry and liver function test in 2-3 weeks from today  -Refer to pulmonary rehabilitation   OV 07/20/2022  Subjective:  Patient ID: Jake [name], male , DOB: 12-22-1960 , age 56 y.o. , MRN: [id number] , ADDRESS: [address] [place] [address] PCP Jomarie Longs, PA-C Patient Care Team: Nolene Ebbs as PCP - General (Family Medicine) Thomasene Ripple, DO as PCP - Cardiology (Cardiology) Fran Lowes, PA-C as Referring Physician (Pulmonary Disease) Gabriel Carina, [facility] as Pharmacist (Pharmacist)  This Provider for this visit: Treatment Team:  Attending Provider: Kalman Shan, MD    07/20/2022 -   Chief Complaint  Patient presents with   Follow-up    Pt states he has been doing okay since last visit. Has not worn his O2 in about a month and states that he has been walking about 0.4mile a day.     HPI Jake [name] 62 y.o. -returns for follow-up with his wife.  Since his last visit he has lost over 10 pounds because of Ozempic.  He started his Ozempic.  Then approximately a month ago he did see Dr. Marcie Bal at [facility].  At the time his exercise hypoxemia improved.  He started beginning to feel better as well.  According to the wife and him a day prior to that he was still feeling miserable but on the day of the visit he started noticing a turnaround and since then he has had sustained turnaround.  He was having side effects from pirfenidone and this was stopped  early October 2023.  Dr. Harrold Donath recommended mycophenolate mofetil [patient mistakenly thought this was metoprolol at this visit but later confirmed that he has been on metoprolol for a long time to Dr. Jens Som for his heart failure].  Since then he is having sustained improvement in dyspnea.  Also he was able to go to [employer] and walk on flat ground for a long time without desaturating.  He still desaturates but it is only for heavy exertion such as vacuuming or lifting heavy load.  He will go into the low 80s and then rest to recover.  He does not use his oxygen for his hypoxemia.  Instead he opted to rest.  At this point in time he is telling me because of his improvement he does not want to do his mycophenolate Reather Littler is going to confirm it was indeed mycophenolate and not metoprolol].  I referred him to pulm rehabilitation but this was expensive.  He is opting to exercise by himself and continue weight loss with Ozempic.  Also because of his improvement he is asking if he can do a pulmonary function test.  His walking desaturation test is improved.  He also his pulmonary function test shows improvement although he still worse compared to June  of last year.  His last echocardiogram was in the summer 2023 and he still had low ejection fraction.  His BNP at that time was normal.  In terms of lung transplant Dr. Harrold Donath is told him it is too high risk given his medication side effects and heart failure and obesity.  At this point in time he is reconciled to the fact that he will not be a transplant candidate.  However he is overall pleased that he is improved.         OV 10/29/2022  Subjective:  Patient ID: Jake [name], male , DOB: [birth year] , age 46 y.o. , MRN: [id number] , ADDRESS: [address] [place] [address] PCP Jomarie Longs, PA-C Patient Care Team: Nolene Ebbs as PCP - General (Family Medicine) Thomasene Ripple, DO as PCP - Cardiology  (Cardiology) Fran Lowes, PA-C as Referring Physician (Pulmonary Disease) Gabriel Carina, [facility] as Pharmacist (Pharmacist)  This Provider for this visit: Treatment Team:  Attending Provider: Kalman Shan, MD    10/29/2022 -   Chief Complaint  Patient presents with   Follow-up    Cleda Daub review,     HPI Jake [name] 62 y.o. - Returns for follow-up with his wife.  Overall he reports being stable.  Weight is stable.  He is no longer on antifibrotic's because of intolerance.  His ejection fraction has dropped to 30% and he is frustrated by that.  Wife states that he does desaturate when he exerts and then he rests and the oxygen goes up.  She would prefer for him to wear the oxygen preemptively.  I encouraged him to do so because that is a better strategy.  At this point in time he states that he had a rough flu season he had stomach flu twice.  He believes he might have "once in 2 or 3 other respiratory infections again denies any mold or mildew or bird feathers in the house he does do housework but has to wear oxygen for that he can go shopping without oxygen but wife suspects he might be desaturating.  We did discuss clinical trials as a care option.  Presented to have inhaled pirfenidone trial that is yet to start and also inhaled treprostinil trial that is ongoing.  They are interested in this.  Discussed the following care concept about clinical trials.  Will reassess for this and second quarter or third quarter 2024.  He did have a CT scan of the chest Jan 2024 I reviewed the result.  ILD is described but there is no other abnormality described.  Since CT scan with contrast so we cannot compare the CT scans.     OV 01/05/2023  Subjective:  Patient ID: Jake [name], male , DOB: [birth year] , age 76 y.o. , MRN: [id number] , ADDRESS: [address] 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 but also exposure to a [employer]ips Respironics devic Pattern does NOT fit In with IPF 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 - presents with wife. On approx 11/16/22 felt unweasy with chest burn. Admitted at [facility] and dx with MI with trop leak. Had DES. Dsichargd on Brilintia and metoprolol but both gav him side efffects of dyspnea, anxiety, arthralgia. So both stopped and now on plavix and repatha. Toleraitng it ok but for mild fatigues. He came in today to updated me. Resp wise he feels stable. Can hold pulse ox > 88% at rest but with exertun desaturates. Wife is heer with him and is indeoendenbt historian - says he is not promot about wearing his o2. Currenty dealing with allergies. Used wifes; Azihro Wondered if I could fill his azithro but his baseline 2024 and 2023 EKG bith show QTc > 500 msec     SYMPTOM SCALE - ILD 02/03/2022 07/20/2022 Off esbreit and ofev 10/29/22 Ef now 30-35%   Current weight 231# 224# - ozempic 229#   O2 use 4L [place] rest  Ra rest   Shortness of Breath 0 -> 5  scale with 5 being worst (score 6 If unable to do)     At rest 2 0 2   Simple tasks - showers, clothes change, eating, shaving 5 2 3    Household (dishes, doing bed, laundry) 5 4 5    Shopping 5 2 1    Walking level at own pace 4 3 2    Walking up Stairs 5 4 4    Total (30-36) Dyspnea Score 26 15 17        Non-dyspnea symptoms (0-> 5 scale) 02/03/2022 07/20/2022  10/29/2022    How bad is your cough? 4 3 1    How bad is your fatigue 5 2 3    How bad is nausea 4 5 4    How bad is vomiting?  0 0 0   How bad is diarrhea? 0 1 0   How bad is anxiety? 5 1 2    How bad is depression 5 1 2    Any chronic pain - if so where and how bad 3 x       Simple office walk 185 feet x  3 laps goal with forehead probe 07/20/2022  01/05/2023   O2 used ra ra  Number laps completed 3 5 sit stand  Comments about pace avg   Resting Pulse Ox/HR 99% and 85/min 94%  Final Pulse Ox/HR 93% and 100/min 89%  Desaturated </= 88% no   Desaturated <= 3% points Yes 6    Got Tachycardic >/= 90/min yes   Symptoms at end of test Mild dyspea   Miscellaneous comments improved     PFT     Latest Ref Rng & Units 10/29/2022   12:49 PM 07/02/2022   11:35 AM 05/04/2022    3:32 PM 01/15/2022    3:58 PM 02/26/2021   12:00 PM  PFT Results  FVC-Pre L  2.20  1.87  1.91  2.44   FVC-Predicted Pre % 41  44  38  38  49   FVC-Post L    1.97  2.35   FVC-Predicted  Post %    40  47   Pre FEV1/FVC % % 91  91  94  96  93   Post FEV1/FCV % %    90  91   FEV1-Pre L 1.87  2.01  1.77  1.83  2.27   FEV1-Predicted Pre % 50  54  47  49  60   FEV1-Post L    1.78  2.15   DLCO uncorrected ml/min/mmHg 12.55  12.27  11.35  14.05  16.66   DLCO UNC% % 44  43  40  49  58   DLCO corrected ml/min/mmHg  12.27  11.93  14.05  16.66   DLCO COR %Predicted %  43  42  49  58   DLVA Predicted % 97  86  92  105  101   TLC L    4.36  4.52   TLC % Predicted %    60  62   RV % Predicted %    60  66        has a past medical history of Arthritis,  Bipolar disorder (HCC), GERD (gastroesophageal reflux disease), Hyperlipidemia, Hypertension, and 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   reports that he quit smoking about 14 years ago. His smoking use included cigarettes. He started smoking about 42 years ago. He has a 28.00 pack-year smoking history. He has never used smokeless tobacco.  Past Surgical History:  Procedure Laterality Date   ANKLE FRACTURE SURGERY Left    APPENDECTOMY     BIOPSY  11/18/2021   Procedure: BIOPSY;  Surgeon: Shellia Cleverly, DO;  Location: [facility];  Service: Gastroenterology;;  EGD and COLON   CERVICAL FUSION     CHOLECYSTECTOMY     COLON SURGERY     COLONOSCOPY WITH PROPOFOL N/A 11/18/2021   Procedure: COLONOSCOPY WITH PROPOFOL;  Surgeon: Shellia Cleverly, DO;  Location: [facility];  Service: Gastroenterology;  Laterality: N/A;   ESOPHAGOGASTRODUODENOSCOPY (EGD) WITH PROPOFOL N/A 11/18/2021   Procedure: ESOPHAGOGASTRODUODENOSCOPY (EGD) WITH PROPOFOL;  Surgeon: Shellia Cleverly, DO;  Location: [facility];  Service: Gastroenterology;  Laterality: N/A;   HERNIA REPAIR     POLYPECTOMY  11/18/2021   Procedure: POLYPECTOMY;  Surgeon: Shellia Cleverly, DO;  Location: [facility];  Service: Gastroenterology;;   RIGHT HEART CATH N/A 02/13/2022   Procedure: RIGHT HEART CATH;  Surgeon: Kathleene Hazel, MD;  Location: [facility] INVASIVE CV LAB;  Service: Cardiovascular;  Laterality: N/A;    Allergies  Allergen Reactions   Fentanyl Other (See Comments)    Avoid opiates Other reaction(s): Other Avoid opiates   Other     04/06/18 Patient is a recovering narcotic abuser and, per wife, is NEVER to receive controlled substances (e.g. Narcotics, sedatives, etc.) Other reaction(s): Unknown 04/06/18 Patient is a recovering narcotic abuser and, per wife, is NEVER to receive controlled substances (e.g. Narcotics, sedatives, etc.)   Aspirin Nausea And Vomiting   Lipitor [Atorvastatin] Nausea And  Vomiting    GI side effects.     Prednisone Other (See Comments)     Agitation Severe anger outbursts   Ofev [Nintedanib]     GI side effects   Pirfenidone    Contrast Media [Iodinated Contrast Media] Palpitations    Patient states he tolerates now   Nitroglycerin Nausea And Vomiting    Immunization History  Administered Date(s) Administered   Influenza Inj Mdck Quad Pf 06/18/2022   Influenza Split 07/16/2014   Influenza,inj,Quad PF,6+ Mos 07/16/2014, 07/21/2017, 08/22/2018,  06/13/2019, 06/10/2021   Influenza-Unspecified 07/16/2014, 06/14/2016   PFIZER Comirnaty(Gray Top)Covid-19 Tri-Sucrose Vaccine 06/18/2022   PFIZER(Purple Top)SARS-COV-2 Vaccination 11/21/2019, 12/24/2019, 06/23/2020   Respiratory Syncytial Virus Vaccine,Recomb Aduvanted(Arexvy) 07/23/2022   Td 04/15/2015   Td (Adult), 2 Lf Tetanus Toxid, Preservative Free 04/15/2015   Td (Adult),5 Lf Tetanus Toxid, Preservative Free 04/15/2015   Tdap 10/10/2009   Zoster Recombinat (Shingrix) 06/10/2021    Family History  Problem Relation Age of Onset   Cancer Mother        breast   Depression Mother    Hyperlipidemia Mother    Dementia Mother    Alcohol abuse Father    Cancer Father        skin   Hyperlipidemia Father    Alzheimer's disease Father    Cancer Maternal Grandfather    Cancer Paternal Grandmother    Pancreatic cancer Paternal Uncle    Colon cancer Neg Hx    Esophageal cancer Neg Hx    Stomach cancer Neg Hx      Current Outpatient Medications:    albuterol (VENTOLIN HFA) 108 (90 Base) MCG/ACT inhaler, TAKE 2 PUFFS BY MOUTH EVERY 6 HOURS AS NEEDED, Disp: 18 each, Rfl: 3   AMBULATORY NON FORMULARY MEDICATION, Continuous positive airway pressure (CPAP) machine set at autotitration of H2O pressure, with all supplemental supplies as needed., Disp: 1 each, Rfl: 0   aspirin 81 MG chewable tablet, Chew by mouth., Disp: , Rfl:    chlorthalidone (HYGROTON) 25 MG tablet, TAKE 1 TABLET BY MOUTH EVERY DAY,  Disp: 90 tablet, Rfl: 1   clopidogrel (PLAVIX) 75 MG tablet, Take 1 tablet (75 mg total) by mouth daily., Disp: 90 tablet, Rfl: 3   colestipol (COLESTID) 1 g tablet, TAKE 1 TABLET BY MOUTH EVERY DAY, Disp: 90 tablet, Rfl: 1   dicyclomine (BENTYL) 20 MG tablet, TAKE 1 TABLET BY MOUTH THREE TIMES A DAY, Disp: 90 tablet, Rfl: 1   ENTRESTO 49-51 MG, Take 1 tablet by mouth 2 (two) times daily., Disp: , Rfl:    Evolocumab (REPATHA) 140 MG/ML SOSY, Inject 1mL Mariano Colon every 14 days., Disp: 2 mL, Rfl: 11   ezetimibe (ZETIA) 10 MG tablet, Take 1 tablet by mouth at bedtime., Disp: , Rfl:    finasteride (PROSCAR) 5 MG tablet, TAKE 1 TABLET (5 MG TOTAL) BY MOUTH DAILY., Disp: 90 tablet, Rfl: 2   fluticasone (FLONASE) 50 MCG/ACT nasal spray, Place into both nostrils daily., Disp: , Rfl:    ibuprofen (ADVIL) 800 MG tablet, TAKE 1 TABLET BY MOUTH EVERY 8 HOURS AS NEEDED, Disp: 90 tablet, Rfl: 5   KLOR-CON M10 10 MEQ tablet, TAKE 1 TABLET BY MOUTH TWICE A DAY, Disp: 180 tablet, Rfl: 1   loratadine (CLARITIN) 10 MG tablet, Take 10 mg by mouth in the morning., Disp: , Rfl:    Misc. Devices (PULSE OXIMETER) MISC, Monitor oxygen three times per day., Disp: 1 each, Rfl: 0   modafinil (PROVIGIL) 200 MG tablet, TAKE 1 TABLET BY MOUTH EVERY DAY, Disp: 30 tablet, Rfl: 2   ondansetron (ZOFRAN) 8 MG tablet, TAKE 1 TABLET BY MOUTH EVERY 8 HOURS AS NEEDED FOR NAUSEA FOR VOMITING, Disp: 90 tablet, Rfl: 1   pantoprazole (PROTONIX) 40 MG tablet, TAKE 1 TABLET BY MOUTH TWICE A DAY, Disp: 180 tablet, Rfl: 1   sertraline (ZOLOFT) 100 MG tablet, Take 2 tablets (200 mg total) by mouth daily., Disp: 180 tablet, Rfl: 1   triamcinolone (NASACORT) 55 MCG/ACT AERO nasal inhaler, Place 2 sprays into  the nose daily as needed (allergies (when pollen is very high))., Disp: , Rfl:   Current Facility-Administered Medications:    sodium chloride flush (NS) 0.9 % injection 3 mL, 3 mL, Intravenous, Q12H, Crenshaw, Madolyn Frieze, MD      Objective:    Vitals:   01/05/23 1621  BP: 120/80  Weight: 228 lb 9.6 oz (103.7 kg)  Height: 5\' 11"  (1.803 m)    Estimated body mass index is 31.88 kg/m as calculated from the following:   Height as of this encounter: 5\' 11"  (1.803 m).   Weight as of this encounter: 228 lb 9.6 oz (103.7 kg).  @WEIGHTCHANGE @  [employer]   01/05/23 1621  Weight: 228 lb 9.6 oz (103.7 kg)     Physical Exam  General: No distress. Has lost weight Neuro: Alert and Oriented x 3. GCS 15. Speech normal Psych: Pleasant Resp:  Barrel Chest - no.  Wheeze - no, Crackles - some , No overt respiratory distress CVS: Normal heart sounds. Murmurs - no Ext: Stigmata of Connective Tissue Disease - no HEENT: Normal upper airway. PEERL +. No post nasal drip        Assessment:       ICD-10-CM   1. ILD (interstitial lung disease) (HCC)  J84.9 Pulmonary function test    2. Chronic respiratory failure with hypoxia (HCC)  J96.11     3. Chronic systolic congestive heart failure, NYHA class 3 (HCC)  I50.22     4. Seasonal allergies  J30.2     5. QT prolongation  R94.31          Plan:     Patient Instructions  ILD (interstitial lung disease) (HCC) Chronic respiratory failure with hypoxia (HCC)  You have had ILD - possible onset spring 2021 -> definitely seen in  2022 and more progressive May 2023 -> you seem improved Oct/Nov 2023 -> stable Feb 2024 and clinically stable in April 2024.  Drop oxygen to 89% after 5 sit/stand on 01/05/2023    Prolonged QTc  -EKG March 2024 QTc interval greater than 500 ms and also greater than 500 ms in 2023  Current systemic allergies from spring season  -Hesitate to give you azithromycin in the setting of prolonged QTc    Plan -I am hesitant to give you azithromycin in the setting of prolonged QTc for allergies --  continue o2 with exerttion   - increase as needed to to keep pulse ox > 88%  - important to protect your heart  - use it pre-emptively  - no need for o2 at  rest  - can consider you for clinical trials in future    Followup  -3 months do spiro/dlco  -3 history of pulm embolism months with DR Marchelle Gearing or first available - 30 min visit  - can cancel these visitis if in research protocol    (Level 04: Estb 30-39 min  visit type: on-site physical face to visit visit spent in total care time and counseling or/and coordination of care by this undersigned MD - Dr Kalman Shan. This includes one or more of the following on this same day 01/05/2023: pre-charting, chart review, note writing, documentation discussion of test results, diagnostic or treatment recommendations, prognosis, risks and benefits of management options, instructions, education, compliance or risk-factor reduction. It excludes time spent by the CMA or office staff in the care of the patient . Actual time is 31 min)   SIGNATURE    Dr. Kalman Shan, M.D., F.C.C.P,  Pulmonary and Critical Care Medicine Staff Physician, [facility] Director - Interstitial Lung Disease  Program  Pulmonary Fibrosis [facility] at [facility] [place], [place], [address]  Pager: [phone], If no answer or between  15:00h - 7:00h: call [phone] Telephone: [phone]  10:12 PM 01/05/2023

## 2023-01-05 NOTE — Patient Instructions (Addendum)
ILD (interstitial lung disease) (HCC) Chronic respiratory failure with hypoxia (HCC)  You have had ILD - possible onset spring 2021 -> definitely seen in  2022 and more progressive May 2023 -> you seem improved Oct/Nov 2023 -> stable Feb 2024 and clinically stable in April 2024.  Drop oxygen to 89% after 5 sit/stand on 01/05/2023    Prolonged QTc  -EKG March 2024 QTc interval greater than 500 ms and also greater than 500 ms in 2023  Current systemic allergies from spring season  -Hesitate to give you azithromycin in the setting of prolonged QTc    Plan -I am hesitant to give you azithromycin in the setting of prolonged QTc for allergies --  continue o2 with exerttion   - increase as needed to to keep pulse ox > 88%  - important to protect your heart  - use it pre-emptively  - no need for o2 at rest  - can consider you for clinical trials in future    Followup  -3 months do spiro/dlco  -3 history of pulm embolism months with DR Marchelle Gearing or first available - 30 min visit  - can cancel these visitis if in research protocol

## 2023-01-06 ENCOUNTER — Other Ambulatory Visit: Payer: Self-pay | Admitting: Physician Assistant

## 2023-01-06 DIAGNOSIS — K219 Gastro-esophageal reflux disease without esophagitis: Secondary | ICD-10-CM

## 2023-01-08 ENCOUNTER — Encounter: Payer: Self-pay | Admitting: Student

## 2023-01-08 ENCOUNTER — Ambulatory Visit: Payer: Medicare HMO | Attending: Cardiovascular Disease | Admitting: Student

## 2023-01-08 ENCOUNTER — Telehealth: Payer: Self-pay | Admitting: Pharmacist

## 2023-01-08 DIAGNOSIS — E7849 Other hyperlipidemia: Secondary | ICD-10-CM | POA: Diagnosis not present

## 2023-01-08 DIAGNOSIS — E785 Hyperlipidemia, unspecified: Secondary | ICD-10-CM | POA: Diagnosis not present

## 2023-01-08 NOTE — Assessment & Plan Note (Addendum)
Assessment/Plan: LDL goal: < 55 mg/dl last LDLc 97 mg/dl 40/98/1191) TG 478 encouraged to follow low carb diet and to do regular physical activity  Tolerates Zetia and Repatha well  well without any side effects  Cost can be an issue when go to coverage gap so will enrol him in the [employer] grant  Continue taking current medications Repatha 140 mg Q 2 weeks  and ezetimibe 10 mg  Lipid lab due in 2-3 months

## 2023-01-08 NOTE — Progress Notes (Unsigned)
Patient ID: Jake [name]                 DOB: Mar 18, 1961                    MRN: [id number]      HPI: Jake [name] is a 62 y.o. male patient referred to lipid clinic by [facility]. PMH is significant for CAD, hx of NSTEMI 11/17/2022 PCI with DES to Lcx, CHF,LBBB, HTN,OSA, ILD,GERD,Syncope.  Patient presented today with his wife for lipid clinic. His PCP has already initiated Repatha and he took his 1st dose 2 weeks ago. Today his second dose is due. He is tolerating it well without any side effects. He has made some significant diet changes after MI in March. But with his diverticulitis food choice becomes limited. Advised to consult nutritionist. Patient was concern about the co-pay cost when he hit the coverage gap. Patient does not do regular exercise as he recently moved from his old house to new house. But very active moving house so he counts that as an exercise. He has lost almost 30 lbs in couple years was 260 lbs now he weighs 225 lbs. His goal is to go down to 200 lbs. He has tried [place]  in the past for weight loss but he could not tolerate it due to sever nausea and constipation. 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     Current Medications: Repatha 140 mg Q 2 weeks, ezetimibe 10 mg daily Intolerances: statins- myalgia Risk Factors: CAD, hx of NSTEMI 11/17/2022 PCI with DES to Lcx, CHF, HTN,OSA LDL goal: <55 mg/dl  Diet: does not eat out a lot and cut down on regular sodas. Eats more fish and chicken and lots of greens with minimum salads dressings.   Exercise: walk a dog couple times per day  Family History:  Father: HDL, dementia Mother: demetia, HDL  Paternal uncle: pancreatic cancer  Maternal GF: brain tumor PGM: died young unknown reason Brother: healthy age 45   Social History:  Alcohol: none 30 years sober Smoke: quit 20 years ago   Labs: Lipid Panel     Component Value Date/Time    CHOL 166 11/27/2022 1437   TRIG 174 (H) 11/27/2022 1437   HDL 39 (L) 11/27/2022 1437   CHOLHDL 4.3 11/27/2022 1437   CHOLHDL 4.0 06/09/2021 1001   LDLCALC 97 11/27/2022 1437   LDLCALC 92 06/09/2021 1001   LABVLDL 30 11/27/2022 1437    Past Medical History:  Diagnosis Date   Arthritis    lower back   Bipolar disorder (HCC)    GERD (gastroesophageal reflux disease)    Hyperlipidemia    Hypertension    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    Current Outpatient Medications on File Prior to Visit  Medication Sig Dispense Refill   albuterol (VENTOLIN HFA) 108 (90 Base) MCG/ACT inhaler TAKE 2 PUFFS BY MOUTH EVERY 6 HOURS AS NEEDED 18 each 3   AMBULATORY NON FORMULARY MEDICATION Continuous positive airway pressure (CPAP) machine set at autotitration of H2O pressure, with all supplemental supplies as needed. 1 each 0   aspirin 81 MG chewable tablet Chew by mouth.     chlorthalidone (HYGROTON) 25 MG tablet TAKE 1 TABLET BY MOUTH EVERY DAY 90 tablet 1   clopidogrel (PLAVIX) 75 MG tablet Take 1 tablet (75 mg total) by mouth daily. 90 tablet 3   colestipol (  COLESTID) 1 g tablet TAKE 1 TABLET BY MOUTH EVERY DAY 90 tablet 1   dicyclomine (BENTYL) 20 MG tablet TAKE 1 TABLET BY MOUTH THREE TIMES A DAY 90 tablet 1   ENTRESTO 49-51 MG Take 1 tablet by mouth 2 (two) times daily.     Evolocumab (REPATHA) 140 MG/ML SOSY Inject 1mL [place] every 14 days. 2 mL 11   ezetimibe (ZETIA) 10 MG tablet Take 1 tablet by mouth at bedtime.     finasteride (PROSCAR) 5 MG tablet TAKE 1 TABLET (5 MG TOTAL) BY MOUTH DAILY. 90 tablet 2   fluticasone (FLONASE) 50 MCG/ACT nasal spray Place into both nostrils daily.     ibuprofen (ADVIL) 800 MG tablet TAKE 1 TABLET BY MOUTH EVERY 8 HOURS AS NEEDED 90 tablet 5   KLOR-CON M10 10 MEQ tablet TAKE 1 TABLET BY MOUTH TWICE A DAY 180 tablet 1   loratadine (CLARITIN) 10 MG tablet Take 10 mg by mouth in the morning.     Misc. Devices (PULSE OXIMETER) MISC  Monitor oxygen three times per day. 1 each 0   modafinil (PROVIGIL) 200 MG tablet TAKE 1 TABLET BY MOUTH EVERY DAY 30 tablet 2   ondansetron (ZOFRAN) 8 MG tablet TAKE 1 TABLET BY MOUTH EVERY 8 HOURS AS NEEDED FOR NAUSEA FOR VOMITING 90 tablet 1   pantoprazole (PROTONIX) 40 MG tablet TAKE 1 TABLET BY MOUTH TWICE A DAY 180 tablet 1   sertraline (ZOLOFT) 100 MG tablet Take 2 tablets (200 mg total) by mouth daily. 180 tablet 1   triamcinolone (NASACORT) 55 MCG/ACT AERO nasal inhaler Place 2 sprays into the nose daily as needed (allergies (when pollen is very high)).     Current Facility-Administered Medications on File Prior to Visit  Medication Dose Route Frequency Provider Last Rate Last Admin   sodium chloride flush (NS) 0.9 % injection 3 mL  3 mL Intravenous Q12H Crenshaw, Madolyn Frieze, MD        Allergies  Allergen Reactions   Fentanyl Other (See Comments)    Avoid opiates Other reaction(s): Other Avoid opiates   Other     04/06/18 Patient is a recovering narcotic abuser and, per wife, is NEVER to receive controlled substances (e.g. Narcotics, sedatives, etc.) Other reaction(s): Unknown 04/06/18 Patient is a recovering narcotic abuser and, per wife, is NEVER to receive controlled substances (e.g. Narcotics, sedatives, etc.)   Aspirin Nausea And Vomiting   Lipitor [Atorvastatin] Nausea And Vomiting    GI side effects.     Prednisone Other (See Comments)     Agitation Severe anger outbursts   Ofev [Nintedanib]     GI side effects   Pirfenidone    Contrast Media [Iodinated Contrast Media] Palpitations    Patient states he tolerates now   Nitroglycerin Nausea And Vomiting    Assessment/Plan:  1. Hyperlipidemia -  Problem  Hyperlipidemia   Current Medications: Repatha 140 mg Q 2 weeks, ezetimibe 10 mg daily Intolerances: statins- myalgia Risk Factors: CAD, hx of NSTEMI 11/17/2022 PCI with DES to Lcx, CHF, HTN,OSA LDL goal: <55 mg/dl    Hyperlipidemia Assessment/Plan: LDL goal:  < 55 mg/dl last LDLc 97 mg/dl 16/06/9603) TG 540 encouraged to follow low carb diet and to do regular physical activity  Tolerates Zetia and Repatha well  well without any side effects  Cost can be an issue when go to coverage gap so will enrol him in the [employer] grant  Continue taking current medications Repatha 140 mg Q 2 weeks  and ezetimibe 10 mg  Lipid lab due in 2-3 months     Thank you,  Carmela Hurt, Pharm.D [facility] A Division of [facility] [address], [place], [place] [address]  Phone: [phone]; Fax: [phone]

## 2023-01-08 NOTE — Telephone Encounter (Signed)
Enrolled in [facility] for Repatha co- pay assistance  ID [id number]   CARD STATUS Active   BIN [id number]   PCN [id number]     PC GROUP [id number]

## 2023-01-26 DIAGNOSIS — J849 Interstitial pulmonary disease, unspecified: Secondary | ICD-10-CM | POA: Diagnosis not present

## 2023-02-04 ENCOUNTER — Other Ambulatory Visit: Payer: Self-pay | Admitting: Psychiatry

## 2023-02-04 ENCOUNTER — Other Ambulatory Visit: Payer: Self-pay | Admitting: Physician Assistant

## 2023-02-04 DIAGNOSIS — I5022 Chronic systolic (congestive) heart failure: Secondary | ICD-10-CM

## 2023-02-04 DIAGNOSIS — J9611 Chronic respiratory failure with hypoxia: Secondary | ICD-10-CM

## 2023-02-04 DIAGNOSIS — G4733 Obstructive sleep apnea (adult) (pediatric): Secondary | ICD-10-CM

## 2023-02-04 DIAGNOSIS — R110 Nausea: Secondary | ICD-10-CM

## 2023-02-04 DIAGNOSIS — J984 Other disorders of lung: Secondary | ICD-10-CM

## 2023-02-04 DIAGNOSIS — R0902 Hypoxemia: Secondary | ICD-10-CM

## 2023-02-04 DIAGNOSIS — J849 Interstitial pulmonary disease, unspecified: Secondary | ICD-10-CM

## 2023-02-04 DIAGNOSIS — R0602 Shortness of breath: Secondary | ICD-10-CM

## 2023-02-14 ENCOUNTER — Other Ambulatory Visit: Payer: Self-pay | Admitting: Physician Assistant

## 2023-02-14 DIAGNOSIS — K580 Irritable bowel syndrome with diarrhea: Secondary | ICD-10-CM

## 2023-02-15 ENCOUNTER — Telehealth: Payer: Self-pay | Admitting: Cardiology

## 2023-02-15 ENCOUNTER — Telehealth: Payer: Self-pay | Admitting: Internal Medicine

## 2023-02-15 DIAGNOSIS — I214 Non-ST elevation (NSTEMI) myocardial infarction: Secondary | ICD-10-CM

## 2023-02-15 DIAGNOSIS — E782 Mixed hyperlipidemia: Secondary | ICD-10-CM

## 2023-02-15 DIAGNOSIS — I2510 Atherosclerotic heart disease of native coronary artery without angina pectoris: Secondary | ICD-10-CM

## 2023-02-15 DIAGNOSIS — Z955 Presence of coronary angioplasty implant and graft: Secondary | ICD-10-CM

## 2023-02-15 NOTE — Telephone Encounter (Signed)
Pt called in bc he is having trouble breathing and having a bad cough and wants to know if he should have a CT scan ordered on his lungs.

## 2023-02-15 NOTE — Telephone Encounter (Signed)
Pt is requesting a refill a medications Entresto and ezetimibe. Dr. Jens Som did not prescribe these medications. Would Dr. Jens Som like to refill these medications? Please address

## 2023-02-15 NOTE — Telephone Encounter (Signed)
*  STAT* If patient is at the pharmacy, call can be transferred to refill team.   1. Which medications need to be refilled? (please list name of each medication and dose if known) ENTRESTO 49-51 MG   ezetimibe (ZETIA) 10 MG tablet   2. Which pharmacy/location (including street and city if local pharmacy) is medication to be sent to?  [id number] [place], [place] - [address]    3. Do they need a 30 day or 90 day supply? 90

## 2023-02-15 NOTE — Telephone Encounter (Signed)
Called and spoke with the pt  I scheduled him acute visit with MR for tomorrow and advised to seek emergent care sooner if needed

## 2023-02-16 ENCOUNTER — Encounter: Payer: Self-pay | Admitting: Internal Medicine

## 2023-02-16 ENCOUNTER — Ambulatory Visit: Payer: Medicare HMO | Admitting: Internal Medicine

## 2023-02-16 ENCOUNTER — Telehealth: Payer: Self-pay | Admitting: Internal Medicine

## 2023-02-16 ENCOUNTER — Other Ambulatory Visit: Payer: Medicare HMO

## 2023-02-16 ENCOUNTER — Telehealth: Payer: Self-pay | Admitting: Cardiology

## 2023-02-16 VITALS — BP 136/72 | HR 81 | Ht 71.0 in | Wt 227.6 lb

## 2023-02-16 DIAGNOSIS — J849 Interstitial pulmonary disease, unspecified: Secondary | ICD-10-CM | POA: Diagnosis not present

## 2023-02-16 DIAGNOSIS — R0609 Other forms of dyspnea: Secondary | ICD-10-CM

## 2023-02-16 DIAGNOSIS — J209 Acute bronchitis, unspecified: Secondary | ICD-10-CM

## 2023-02-16 DIAGNOSIS — J9621 Acute and chronic respiratory failure with hypoxia: Secondary | ICD-10-CM | POA: Diagnosis not present

## 2023-02-16 DIAGNOSIS — E785 Hyperlipidemia, unspecified: Secondary | ICD-10-CM

## 2023-02-16 LAB — CBC WITH DIFFERENTIAL/PLATELET
Basophils Absolute: 0 10*3/uL (ref 0.0–0.1)
Basophils Relative: 0.6 % (ref 0.0–3.0)
Eosinophils Absolute: 0.3 10*3/uL (ref 0.0–0.7)
Eosinophils Relative: 4.1 % (ref 0.0–5.0)
HCT: 39.1 % (ref 39.0–52.0)
Hemoglobin: 12.8 g/dL — ABNORMAL LOW (ref 13.0–17.0)
Lymphocytes Relative: 14.7 % (ref 12.0–46.0)
Lymphs Abs: 0.9 10*3/uL (ref 0.7–4.0)
MCHC: 32.8 g/dL (ref 30.0–36.0)
MCV: 81.2 fl (ref 78.0–100.0)
Monocytes Absolute: 0.8 10*3/uL (ref 0.1–1.0)
Monocytes Relative: 11.7 % (ref 3.0–12.0)
Neutro Abs: 4.4 10*3/uL (ref 1.4–7.7)
Neutrophils Relative %: 68.9 % (ref 43.0–77.0)
Platelets: 286 10*3/uL (ref 150.0–400.0)
RBC: 4.81 Mil/uL (ref 4.22–5.81)
RDW: 13.4 % (ref 11.5–15.5)
WBC: 6.4 10*3/uL (ref 4.0–10.5)

## 2023-02-16 LAB — HEPATIC FUNCTION PANEL
ALT: 23 U/L (ref 0–53)
AST: 31 U/L (ref 0–37)
Albumin: 4.6 g/dL (ref 3.5–5.2)
Alkaline Phosphatase: 60 U/L (ref 39–117)
Bilirubin, Direct: 0.1 mg/dL (ref 0.0–0.3)
Total Bilirubin: 0.3 mg/dL (ref 0.2–1.2)
Total Protein: 7.5 g/dL (ref 6.0–8.3)

## 2023-02-16 LAB — BASIC METABOLIC PANEL
BUN: 19 mg/dL (ref 6–23)
CO2: 35 mEq/L — ABNORMAL HIGH (ref 19–32)
Calcium: 9.6 mg/dL (ref 8.4–10.5)
Chloride: 88 mEq/L — ABNORMAL LOW (ref 96–112)
Creatinine, Ser: 1.01 mg/dL (ref 0.40–1.50)
GFR: 79.93 mL/min (ref >60.00)
Glucose, Bld: 92 mg/dL (ref 70–99)
Potassium: 3.6 mEq/L (ref 3.5–5.1)
Sodium: 130 mEq/L — ABNORMAL LOW (ref 135–145)

## 2023-02-16 LAB — LIPID PANEL
Cholesterol: 91 mg/dL (ref 0–200)
HDL: 42.6 mg/dL (ref >39.00)
LDL Cholesterol: 22 mg/dL (ref 0–99)
NonHDL: 48.04
Total CHOL/HDL Ratio: 2
Triglycerides: 128 mg/dL (ref 0.0–149.0)
VLDL: 25.6 mg/dL (ref 0.0–40.0)

## 2023-02-16 LAB — BRAIN NATRIURETIC PEPTIDE: Pro B Natriuretic peptide (BNP): 125 pg/mL — ABNORMAL HIGH (ref 0.0–100.0)

## 2023-02-16 MED ORDER — DOXYCYCLINE HYCLATE 100 MG PO TABS: 100.0000 mg | ORAL_TABLET | Freq: Two times a day (BID) | ORAL | 0 refills | Status: DC

## 2023-02-16 MED ORDER — EZETIMIBE 10 MG PO TABS
10.0000 mg | ORAL_TABLET | Freq: Every day | ORAL | 1 refills | Status: DC
Start: 2023-02-16 — End: 2023-08-17

## 2023-02-16 MED ORDER — ENTRESTO 49-51 MG PO TABS: 1.0000 | ORAL_TABLET | Freq: Two times a day (BID) | ORAL | 3 refills | Status: DC

## 2023-02-16 NOTE — Telephone Encounter (Signed)
Pt calling back requesting a refill on his medication entresto and ezetimibe. Would Dr. Jens Som like to refill these medications? Please address

## 2023-02-16 NOTE — Progress Notes (Signed)
OV 02/03/2022 -transfer of care to Dr. Marchelle Gearing at the ILD clinic by Dr. Val Eagle  Subjective:  Patient ID: Jake [name], male , DOB: 1961/07/26 , age 62 y.o. , MRN: [id number] , ADDRESS: [address] [place] [address] PCP Jomarie Longs, PA-C Patient Care Team: Nolene Ebbs as PCP - General (Family Medicine) Thomasene Ripple, DO as PCP - Cardiology (Cardiology) Fran Lowes, PA-C as Referring Physician (Pulmonary Disease) Gabriel Carina, [facility] as Pharmacist (Pharmacist)  This Provider for this visit: Treatment Team:  Attending Provider: Kalman Shan, MD    02/03/2022 -   Chief Complaint  Patient presents with   Follow-up    Pt switching from Dr. Wynona Neat to MR for ILD eval. Pt states lately he has had problems of SOB that can happen at any time and also has a chronic cough.     HPI Jake [name] 62 y.o. -history is provided by the wife, patient and also reviewed the medical records and the [name].  He has had insidious onset of shortness of breath for the last few years.  Chart review it appears that in 2020 when he got his respiratory infection [in retrospect he thinks this was COVID before the formal declaration of the pandemic] and since then he has been having progressive shortness of breath.  Especially over the last year or so his progression is worse..  For the last 1 year has been on oxygen 2 L.  He feels he haseven progressed more in the last few months.  And is now needing oxygen 4 L at rest at all the time.  Sometimes it is 3 L.  Wife says he easily desaturates into the 82s and occasionally into the 79s when he exerts.  When he does the bed or showers or does chores increases to 4 L.  He feels a portable oxygen is not helping him anymore.  Particularly more progressive in the last few months.  Recently was tried on prednisone.  He is not sure if it helped but it made him very angry had irate emotional outburst and he could not take prednisone  anymore.  Through all this late last year in 2022 he was off his Entresto for heart failure.  This made the shortness of breath worse but then he went back on it in January and he got better but nevertheless overall progressive dyspnea.     Jake [name]  Symptoms:     Past Medical History :  Status post CABG in 2004.  He also had cardiac stents approximately 6-12 months ago according to his history with 1 repair of the stent.  Still it did not help the shortness of breath.  He has had COVID at least 3 times.  The first 1 was in January 2020 right before the onset of the pandemic.  The second 1 was in January 2020 and the third was in December 2022 during small bowel obstruction admission.  This was an incidental finding according to the wife  He has chronic systolic heart failure  Other issues include nausea, irritable bowel syndrome, acid reflux back and neck pain  He has obstructive sleep apnea  He has a history of pneumonia several years ago  ROS:  -Positive for fatigue for the last several months, arthralgia for the last several years, dysphagia for the last several decades, dry eyes for the last several months, nausea for the last several years, acid reflux for the last  several years also snoring for the last several years  FAMILY HISTORY of LUNG DISEASE:  Negative for any lung disease  PERSONAL EXPOSURE HISTORY:  -Did smoke in the remote past and quit -Smoked between 1985 and 2008 1 and half packs per day. -Possible cocaine use between 1991 and marijuana use between 1978 and 2008 ": Very little to none   HOME  EXPOSURE and HOBBY DETAILS :  -20 years ago he lived in a house that had black mold he lived there for few years but has not been exposed to that for 20 years.  -Was using a Phillips responding CPAP machine for a few years.  It was subject to recall because of foam silicone  contamination issues that caused respiratory  complications.  He stopped using this in 2021 shortly after the recall was issued.  -He is now living in his daughter's house for the last 3 months.  -The old house did have mold.  It also had mildew in the shower curtain. 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  Restrictive pattern with mild desaturations.  -Echocardiogram March 2022 ` -Ejection fraction 30-35%, grade 1 diastolic dysfunction  - BNP May 2023  - 31  - RHC June 2023  -  Nelva Bush RHC   Echo 02/12/22: EF 40-45%, mild MR. LA moderately dilated. RV size and function normal. GRade 1 DD. RHC 02/12/22: RA- 5 mmHg; PA 25/12 mmHg, mean PAP 17 mmHg, Wedge 8 mmHg, CO (fick) 6.59 L/mim, CI-2.96 L/min meter2.   03/12/2022: Today - follow up Patient presents today with wife after being started on Ofev. He is having significant nausea with his first dose of Ofev. He wakes up in the morning, takes his zofran and eats a small snack. He then takes his Ofev at 8 am with food as well, and has tried multiple different food options. He then becomes extremely nauseated around 845 and has to go lay down until he is able to take his second dose of Zofran around 1/2 pm. He finally begins to feel better and is able to tolerate his second dose. He is understandably frustrated by this  and does not wish to have this poor quality of life from his medications. He denies any vomiting or diarrhea. He has had some weight gain as he has had to eat so often with the Ofev to try to curve his nausea.   From a respiratory standpoint, he continues to get winded with minimal exertion. He can be changing the bed at home or walk 50 ft to the mailbox and has to sit down afterwards to rest and catch his breath. He has also had drops in his oxygen into the 70's-80's despite his supplemental 4 lpm so he has been minimally active because of this. No significant cough or wheezing. He has not noticed any difference in his activity  tolerance or lung function since starting the Ofev, but has felt so terrible that he's not sure if he would notice much.           OV 05/05/2022  Subjective:  Patient ID: Jake [name], male , DOB: [birth year] , age 37 y.o. , MRN: [id number] , ADDRESS: [address] [place] [address] PCP Jomarie Longs, PA-C Patient Care Team: Nolene Ebbs as PCP - General (Family Medicine) Thomasene Ripple, DO as PCP - Cardiology (Cardiology) Fran Lowes, PA-C as Referring Physician (Pulmonary Disease) Gabriel Carina, [facility] as Pharmacist (Pharmacist)  This Provider for this visit: Treatment Team:  Attending Provider: Kalman Shan, MD    05/05/2022 -   Chief Complaint  Patient presents with   Follow-up    Pt states he feels like his breathing has become worse since last visit. Also states he has had a dry cough, chest discomfort, and has been having GI issues due to the Esbriet.     HPI Jake [name] 62 y.o. --presents for follow-up.  Presents with his wife.  He has non-- IPF progressive phenotype ILD.  Suspected etiology usage of CPAP machine Philips Respironics.  He tried nintedanib but he had significant intolerance.  He is currently on fourth week of pirfenidone at full dose for the last 1 week.  He is having significant diarrhea nausea and  fatigue because of this.  At 2 pills 3 times daily it was more tolerable.  He is willing to go back to 2 pills 3 times daily and try.  He also significant anxiety and depression.  He is on Zoloft.  In the past he has tried SNRI or some other agent according to the wife but he had side effects.  I have recommended they talk with the primary care physician about this.  He is also trying to lose weight.  He has been prescribed Wegovy but there is no stock available.  He has been looked at [facility] for lung transplant but given his low EF he has been disqualified and they do not want to see him.  He did have a right heart catheterization in June 2023 and this was normal.  According to him after some delay from our office he has been referred to Drinda Butts at [facility] for lung transplant evaluation.  But he has not heard back from them.  I personally emailed Dr. Lendon Collar office.  Also he tells me that with 4 L nasal cannula at rest he is fine but when he does any laundry he desaturates into the 80s.  He has tried increasing to 5 L nasal cannula.  I advised that he could increase it further and keep his pulse ox over 86% and monitor it.  He is willing to do that.  They are not interested in immunomodulators right now because of young kids in the house.    Overall he and his wife are worried that he is declining.  He did have pulmonary function test today and?  Slight decline compared to May 2023.[name] Kitchen  He had liver function test monitoring blood work yesterday but so far the results are not back yet.   SUMMARY VISIT Dr Harrold Donath 06/09/22 at [facility]   He also agrees that patient does not have IPF.  CT scan is not consistent with UIP.  Currently pirfenidone is causing too many side effects.  He wants to stop it.  Because there is more suggestion of valvulitis he wants to start  CellCept.  He is going to start the patient on 500 mg twice daily today.  Patient seeing me in approximately a month from now.  At  the time I will increase to a total of 1 g twice daily.   He also wants the patient referred to pulmonary rehabilitation.   In terms of lung transplantation a lot of relative contraindications including weight and mild cardiomyopathy and intolerance to steroids.  At this point in time he wants patient to be more active and get into rehab  Of note patient only desaturated to 88% on room air and that walk test today   Plan  - Get patient in for CBC chemistry and liver function test in 2-3 weeks from today  -Refer to pulmonary rehabilitation   OV 07/20/2022  Subjective:  Patient ID: Jake [name], male , DOB: 01-28-1961 , age 63 y.o. , MRN: [id number] , ADDRESS: [address] [place] [address] PCP Jomarie Longs, PA-C Patient Care Team: Nolene Ebbs as PCP - General (Family Medicine) Thomasene Ripple, DO as PCP - Cardiology (Cardiology) Fran Lowes, PA-C as Referring Physician (Pulmonary Disease) Gabriel Carina, [facility] as Pharmacist (Pharmacist)  This Provider for this visit: Treatment Team:  Attending Provider: Kalman Shan, MD    07/20/2022 -   Chief Complaint  Patient presents with   Follow-up    Pt states he has been doing okay since last visit. Has not worn his O2 in about a month and states that he has been walking about 0.73mile a day.     HPI Jake [name] 62 y.o. -returns for follow-up with his wife.  Since his last visit he has lost over 10 pounds because of Ozempic.  He started his Ozempic.  Then approximately a month ago he did see Dr. Marcie Bal at [facility].  At the time his exercise hypoxemia improved.  He started beginning to feel better as well.  According to the wife and him a day prior to that he was still feeling miserable but on the day of the visit he started noticing a turnaround and since then he has had sustained turnaround.  He was having side effects from pirfenidone and this was stopped early October 2023.  Dr.  Harrold Donath recommended mycophenolate mofetil [patient mistakenly thought this was metoprolol at this visit but later confirmed that he has been on metoprolol for a long time to Dr. Jens Som for his heart failure].  Since then he is having sustained improvement in dyspnea.  Also he was able to go to [employer] and walk on flat ground for a long time without desaturating.  He still desaturates but it is only for heavy exertion such as vacuuming or lifting heavy load.  He will go into the low 80s and then rest to recover.  He does not use his oxygen for his hypoxemia.  Instead he opted to rest.  At this point in time he is telling me because of his improvement he does not want to do his mycophenolate Reather Littler is going to confirm it was indeed mycophenolate and not metoprolol].  I referred him to pulm rehabilitation but this was expensive.  He is opting to exercise by himself and continue weight loss with Ozempic.  Also because of his improvement he is asking if he can do a pulmonary function test.  His walking desaturation test is improved.  He also his pulmonary function test shows improvement although he still worse compared to June of last  year.  His last echocardiogram was in the summer 2023 and he still had low ejection fraction.  His BNP at that time was normal.  In terms of lung transplant Dr. Harrold Donath is told him it is too high risk given his medication side effects and heart failure and obesity.  At this point in time he is reconciled to the fact that he will not be a transplant candidate.  However he is overall pleased that he is improved.         OV 10/29/2022  Subjective:  Patient ID: Jake [name], male , DOB: 05-01-1961 , age 34 y.o. , MRN: [id number] , ADDRESS: [address] [place] [address] PCP Jomarie Longs, PA-C Patient Care Team: Nolene Ebbs as PCP - General (Family Medicine) Thomasene Ripple, DO as PCP - Cardiology (Cardiology) Fran Lowes, PA-C as  Referring Physician (Pulmonary Disease) Gabriel Carina, [facility] as Pharmacist (Pharmacist)  This Provider for this visit: Treatment Team:  Attending Provider: Kalman Shan, MD    10/29/2022 -   Chief Complaint  Patient presents with   Follow-up    Cleda Daub review,     HPI Jake [name] 62 y.o. - Returns for follow-up with his wife.  Overall he reports being stable.  Weight is stable.  He is no longer on antifibrotic's because of intolerance.  His ejection fraction has dropped to 30% and he is frustrated by that.  Wife states that he does desaturate when he exerts and then he rests and the oxygen goes up.  She would prefer for him to wear the oxygen preemptively.  I encouraged him to do so because that is a better strategy.  At this point in time he states that he had a rough flu season he had stomach flu twice.  He believes he might have "once in 2 or 3 other respiratory infections again denies any mold or mildew or bird feathers in the house he does do housework but has to wear oxygen for that he can go shopping without oxygen but wife suspects he might be desaturating.  We did discuss clinical trials as a care option.  Presented to have inhaled pirfenidone trial that is yet to start and also inhaled treprostinil trial that is ongoing.  They are interested in this.  Discussed the following care concept about clinical trials.  Will reassess for this and second quarter or third quarter 2024.  He did have a CT scan of the chest Jan 2024 I reviewed the result.  ILD is described but there is no other abnormality described.  Since CT scan with contrast so we cannot compare the CT scans.     OV 01/05/2023  Subjective:  Patient ID: Jake [name], male , DOB: 11-12-1960 , age 49 y.o. , MRN: [id number] , ADDRESS: [address] [place] [address] PCP Jomarie Longs, PA-C Patient Care Team: Nolene Ebbs as PCP - General (Family Medicine) Jens Som Madolyn Frieze, MD as PCP - Cardiology  (Cardiology) Fran Lowes, PA-C as Referring Physician (Pulmonary Disease) Gabriel Carina, [facility] as Pharmacist (Pharmacist)  This Provider for this visit: Treatment Team:  Attending Provider: Kalman Shan, MD    01/05/2023 -   Chief Complaint  Patient presents with   Follow-up    F/up after heart attack    HPI Jake [name] 62 y.o. - presents with wife. On approx 11/16/22 felt unweasy with chest burn. Admitted at [facility] and dx with MI with trop leak. Had  DES. Dsichargd on Brilintia and metoprolol but both gav him side efffects of dyspnea, anxiety, arthralgia. So both stopped and now on plavix and repatha. Toleraitng it ok but for mild fatigues. He came in today to updated me. Resp wise he feels stable. Can hold pulse ox > 88% at rest but with exertun desaturates. Wife is heer with him and is indeoendenbt historian - says he is not promot about wearing his o2. Currenty dealing with allergies. Used wifes; Azihro Wondered if I could fill his azithro but his baseline 2024 and 2023 EKG bith show QTc > 500 msec       OV 02/16/2023  Subjective:  Patient ID: Jake [name], male , DOB: Sep 11, 1960 , age 67 y.o. , MRN: [id number] , ADDRESS: [address] 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     HPI Jake [name] 62 y.o. - ACUTE VISIT3L.  Presents with his wife Rolando Abler.  She is an independent historian.  She corroborates everything that he is saying.  Around February 10, 2023 Wednesday started having slightly more cough.  Then on February 12, 2023 Friday he went to an outdoor concert in [place].  They started coughing more.  Then after he came back he started feeling and this is since February 13, 2023 tired with chest tightness coughing.  Also for the last 1 week his oxygen is dropping.  Previously could take a shower without desaturating now he drops into the 70s.  He says his pulse ox at rest is in the 80s.  Currently pulse ox is 98% on 3 L at rest.  There is no edema but there is orthopnea.  There is no hemoptysis.  There is no wheezing no fever no diarrhea.  Has baseline nausea and it has not changed.  He feels his chest is extremely tight.  There might be some wheezing 2.  The cough is dry but 1 time he did have 1 nickel sized mucus but that is just the 1 time in many days.  He is really worried about his condition.  He does not feel it similar to his previous heart attack.  His wife is wondering about getting a CT chest last CT chest was a year ago.   He does not want steroids.  Especially oral.  He says he is fine with IM steroids.  SYMPTOM SCALE - ILD 02/03/2022 07/20/2022 Off esbreit and ofev 10/29/22 Ef now  30-35%  Current weight 231# 224# - ozempic 229#  O2 use 4L  rest  Ra rest  Shortness of Breath 0 -> 5 scale with 5 being worst (score 6 If unable to do)     At rest 2 0 2  Simple tasks - showers, clothes change, eating, shaving 5 2 3   Household (dishes, doing bed, laundry) 5 4 5   Shopping 5 2 1   Walking level at own pace 4 3 2   Walking up Stairs 5 4 4   Total (30-36) Dyspnea Score 26 15 17       Non-dyspnea symptoms (0-> 5 scale) 02/03/2022 07/20/2022  10/29/2022   How bad is your cough? 4 3 1   How bad is your fatigue 5 2 3   How bad is nausea 4 5 4   How bad is vomiting?  0 0 0  How bad is diarrhea? 0 1 0  How bad is anxiety? 5 1 2   How bad is depression 5 1 2   Any chronic pain - if so where and how bad 3 x      Simple office walk 185 feet x  3 laps goal with forehead probe 07/20/2022  01/05/2023  02/16/2023   O2 used ra ra 3L  Number laps completed 3 5 sit stand   Comments about pace avg    Resting Pulse Ox/HR 99% and 85/min 94% 98% at rest  Final Pulse Ox/HR 93% and 100/min 89%   Desaturated </= 88% no    Desaturated <= 3% points Yes 6     Got Tachycardic >/= 90/min yes    Symptoms at end of test Mild dyspea    Miscellaneous comments improved         PFT     Latest Ref Rng & Units 10/29/2022   12:49 PM 07/02/2022   11:35 AM 05/04/2022    3:32 PM 01/15/2022    3:58 PM 02/26/2021   12:00 PM  PFT Results  FVC-Pre L  2.20  1.87  1.91  2.44   FVC-Predicted Pre % 41  44  38  38  49   FVC-Post L    1.97  2.35   FVC-Predicted Post %    40  47   Pre FEV1/FVC % % 91  91  94  96  93   Post FEV1/FCV % %    90  91   FEV1-Pre L 1.87  2.01  1.77  1.83  2.27   FEV1-Predicted Pre % 50  54  47  49  60   FEV1-Post L    1.78  2.15   DLCO uncorrected ml/min/mmHg 12.55  12.27  11.35  14.05  16.66   DLCO UNC% % 44  43  40  49  58   DLCO corrected ml/min/mmHg  12.27  11.93  14.05  16.66   DLCO COR %Predicted %  43  42  49  58   DLVA Predicted % 97  86  92  105  101   TLC L    4.36  4.52   TLC % Predicted %    60  62   RV % Predicted %    60  66        has a past medical history of Arthritis, Bipolar disorder (HCC), GERD  (gastroesophageal reflux disease), Hyperlipidemia, Hypertension, and 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   reports that he quit smoking about 14 years ago. His smoking use included cigarettes. He started smoking about 42  years ago. He has a 28.00 pack-year smoking history. He has never used smokeless tobacco.  Past Surgical History:  Procedure Laterality Date   ANKLE FRACTURE SURGERY Left    APPENDECTOMY     BIOPSY  11/18/2021   Procedure: BIOPSY;  Surgeon: Shellia Cleverly, DO;  Location: [facility];  Service: Gastroenterology;;  EGD and COLON   CERVICAL FUSION     CHOLECYSTECTOMY     COLON SURGERY     COLONOSCOPY WITH PROPOFOL N/A 11/18/2021   Procedure: COLONOSCOPY WITH PROPOFOL;  Surgeon: Shellia Cleverly, DO;  Location: [facility];  Service: Gastroenterology;  Laterality: N/A;   ESOPHAGOGASTRODUODENOSCOPY (EGD) WITH PROPOFOL N/A 11/18/2021   Procedure: ESOPHAGOGASTRODUODENOSCOPY (EGD) WITH PROPOFOL;  Surgeon: Shellia Cleverly, DO;  Location: [facility];  Service: Gastroenterology;  Laterality: N/A;   HERNIA REPAIR     POLYPECTOMY  11/18/2021   Procedure: POLYPECTOMY;  Surgeon: Shellia Cleverly, DO;  Location: [facility];  Service: Gastroenterology;;   RIGHT HEART CATH N/A 02/13/2022   Procedure: RIGHT HEART CATH;  Surgeon: Kathleene Hazel, MD;  Location: [facility] CV LAB;  Service: Cardiovascular;  Laterality: N/A;    Allergies  Allergen Reactions   Fentanyl Other (See Comments)    Avoid opiates Other reaction(s): Other Avoid opiates   Other     04/06/18 Patient is a recovering narcotic abuser and, per wife, is NEVER to receive controlled substances (e.g. Narcotics, sedatives, etc.) Other reaction(s): Unknown 04/06/18 Patient is a recovering narcotic abuser and, per wife, is NEVER to receive controlled substances (e.g. Narcotics, sedatives, etc.)   Aspirin Nausea And Vomiting   Lipitor [Atorvastatin] Nausea And Vomiting    GI side effects.      Prednisone Other (See Comments)     Agitation Severe anger outbursts   Ofev [Nintedanib]     GI side effects   Pirfenidone    Contrast Media [Iodinated Contrast Media] Palpitations    Patient states he tolerates now   Nitroglycerin Nausea And Vomiting    Immunization History  Administered Date(s) Administered   Influenza Inj Mdck Quad Pf 06/18/2022   Influenza Split 07/16/2014   Influenza,inj,Quad PF,6+ Mos 07/16/2014, 07/21/2017, 08/22/2018, 06/13/2019, 06/10/2021   Influenza-Unspecified 07/16/2014, 06/14/2016   PFIZER Comirnaty(Gray Top)Covid-19 Tri-Sucrose Vaccine 06/18/2022   PFIZER(Purple Top)SARS-COV-2 Vaccination 11/21/2019, 12/24/2019, 06/23/2020   Respiratory Syncytial Virus Vaccine,Recomb Aduvanted(Arexvy) 07/23/2022   Td 04/15/2015   Td (Adult), 2 Lf Tetanus Toxid, Preservative Free 04/15/2015   Td (Adult),5 Lf Tetanus Toxid, Preservative Free 04/15/2015   Tdap 10/10/2009   Zoster Recombinat (Shingrix) 06/10/2021    Family History  Problem Relation Age of Onset   Cancer Mother        breast   Depression Mother    Hyperlipidemia Mother    Dementia Mother    Alcohol abuse Father    Cancer Father        skin   Hyperlipidemia Father    Alzheimer's disease Father    Cancer Maternal Grandfather    Cancer Paternal Grandmother    Pancreatic cancer Paternal Uncle    Colon cancer Neg Hx    Esophageal cancer Neg Hx    Stomach cancer Neg Hx      Current Outpatient Medications:    albuterol (VENTOLIN HFA) 108 (90 Base) MCG/ACT inhaler, INHALE 2 PUFFS BY MOUTH EVERY 6 HOURS AS NEEDED, Disp: 18 each, Rfl: 3   AMBULATORY NON FORMULARY MEDICATION, Continuous positive airway pressure (CPAP) machine set at autotitration of H2O pressure, with all supplemental  supplies as needed., Disp: 1 each, Rfl: 0   aspirin 81 MG chewable tablet, Chew by mouth., Disp: , Rfl:    chlorthalidone (HYGROTON) 25 MG tablet, TAKE 1 TABLET BY MOUTH EVERY DAY, Disp: 90 tablet, Rfl: 1    clopidogrel (PLAVIX) 75 MG tablet, Take 1 tablet (75 mg total) by mouth daily., Disp: 90 tablet, Rfl: 3   colestipol (COLESTID) 1 g tablet, TAKE 1 TABLET BY MOUTH EVERY DAY, Disp: 30 tablet, Rfl: 0   dicyclomine (BENTYL) 20 MG tablet, TAKE 1 TABLET BY MOUTH THREE TIMES A DAY, Disp: 90 tablet, Rfl: 1   ENTRESTO 49-51 MG, Take 1 tablet by mouth 2 (two) times daily., Disp: , Rfl:    Evolocumab (REPATHA) 140 MG/ML SOSY, Inject 1mL [place] every 14 days., Disp: 2 mL, Rfl: 11   ezetimibe (ZETIA) 10 MG tablet, Take 1 tablet by mouth at bedtime., Disp: , Rfl:    finasteride (PROSCAR) 5 MG tablet, TAKE 1 TABLET (5 MG TOTAL) BY MOUTH DAILY., Disp: 90 tablet, Rfl: 2   ibuprofen (ADVIL) 800 MG tablet, TAKE 1 TABLET BY MOUTH EVERY 8 HOURS AS NEEDED, Disp: 90 tablet, Rfl: 5   KLOR-CON M10 10 MEQ tablet, TAKE 1 TABLET BY MOUTH TWICE A DAY, Disp: 180 tablet, Rfl: 1   loratadine (CLARITIN) 10 MG tablet, Take 10 mg by mouth in the morning., Disp: , Rfl:    Misc. Devices (PULSE OXIMETER) MISC, Monitor oxygen three times per day., Disp: 1 each, Rfl: 0   modafinil (PROVIGIL) 200 MG tablet, TAKE 1 TABLET BY MOUTH EVERY DAY, Disp: 30 tablet, Rfl: 0   ondansetron (ZOFRAN) 8 MG tablet, TAKE 1 TABLET BY MOUTH EVERY 8 HOURS AS NEEDED FOR NAUSEA FOR VOMITING, Disp: 90 tablet, Rfl: 1   pantoprazole (PROTONIX) 40 MG tablet, TAKE 1 TABLET BY MOUTH TWICE A DAY, Disp: 180 tablet, Rfl: 1   sertraline (ZOLOFT) 100 MG tablet, Take 2 tablets (200 mg total) by mouth daily., Disp: 180 tablet, Rfl: 1   triamcinolone (NASACORT) 55 MCG/ACT AERO nasal inhaler, Place 2 sprays into the nose daily as needed (allergies (when pollen is very high))., Disp: , Rfl:   Current Facility-Administered Medications:    sodium chloride flush (NS) 0.9 % injection 3 mL, 3 mL, Intravenous, Q12H, Crenshaw, Madolyn Frieze, MD      Objective:   Vitals:   02/16/23 1407  BP: 136/72  Pulse: 81  SpO2: 97%  Weight: 227 lb 9.6 oz (103.2 kg)  Height: 5\' 11"  (1.803 m)     Estimated body mass index is 31.74 kg/m as calculated from the following:   Height as of this encounter: 5\' 11"  (1.803 m).   Weight as of this encounter: 227 lb 9.6 oz (103.2 kg).  @WEIGHTCHANGE @  [employer]   02/16/23 1407  Weight: 227 lb 9.6 oz (103.2 kg)     Physical Exam   General: No distress.  Looks mildly anxious but stable with oxygen on O2 at rest: yes Cane present: no Sitting in wheel chair: no Frail: no Obese: ye Neuro: Alert and Oriented x 3. GCS 15. Speech normal Psych: Pleasant Resp:  Barrel Chest - no.  Wheeze - yes mild, Crackles - no, No overt respiratory distress CVS: Normal heart sounds. Murmurs - no Ext: Stigmata of Connective Tissue Disease - no HEENT: Normal upper airway. PEERL +. No post nasal drip        Assessment:       ICD-10-CM   1. Acute on chronic  respiratory failure with hypoxia (HCC)  J96.21     2. Acute bronchitis, unspecified organism  J20.9     3. ILD (interstitial lung disease) (HCC)  J84.9      Acute illness. Unclear outcome. Wife Differential diagnosis. Concerne about MI or PE or CHF or ILD flare. Concerned for severe exacatbetion    Plan:     Patient Instructions  Acute bronchitis Acute on chronic hypoxemic respiratory failure ILD (interstitial lung disease) (HCC)   You have had ILD - possible onset spring 2021 -> definitely seen in  2022 and more progressive May 2023 -> you seem improved Oct/Nov 2023 -> stable Feb 2024 and clinically stable in April 2024.  Drop oxygen to 89% after 5 sit/stand on 01/05/2023. But on 02/16/2023 -subacute decompensation with bronchitis symptoms.  Need to rule out pulmonary embolism or MI or heart failure of interstitial lung disease flareup =   Plan -Depo-Medrol 80 mg IM x 1 in the office  - no oral steroid due anxiety side effects  -If further steroids felt needed then we can do it in the office or infusion center on a daily basis for a few days  - Take doxycycline 100mg  po twice  daily x 5 days; take after meals and avoid sunlight -Check blood work for CBC with differential, chemistry, liver function test, troponin, BNP and D-dimer and ESR  - run it stat  -If these are abnormal we will have to send you to the emergency department  -Continue oxygen therapy to keep your pulse ox goal greater than 88%  -Do high-resolution CT chest supine and prone in 1-2 weeks  Followup -Return to see Dr. Marchelle Gearing or nurse practitioner in 1-2 weeks but after completing high-resolution CT chest     SIGNATURE    Dr. Kalman Shan, M.D., F.C.C.P,  Pulmonary and Critical Care Medicine Staff Physician, [facility] Center Director - Interstitial Lung Disease  Program  Pulmonary Fibrosis [facility] at [facility] [place], [place], [address]  Pager: [phone], If no answer or between  15:00h - 7:00h: call [phone] Telephone: [phone]  2:33 PM 02/16/2023   HIGh Complexity OFFICE  The table below is from the 2021 E/M guidelines, first released in 2021, with minor revisions added in 2023. Must meet the requirements for 2 out of 3 dimensions to qualify.    Number and complexity of problems addressed Amount and/or complexity of data reviewed Risk of complications and/or morbidity  Severe exacerbation of chronic illness - worsening o2 neeed  Acute or chronic illnesses that may pose a threat to life or bodily function, e.g., multiple trauma, acute MI, pulmonary embolus, severe respiratory distress, progressive rheumatoid arthritis, psychiatric illness with potential threat to self or others, peritonitis, acute renal failure, abrupt change in neurological status Must meet the requirements for 2 of 3 of the categories)  Category 1: Tests and documents, historian  Any combination of 3 of the following:  Assessment requiring an independent historian - wife  Review of prior external note(s) from each unique source  Review of results of  each unique test  Ordering of each unique test - blood work and CT and PFT    Category 2: Interpretation of tests    Independent interpretation of a test performed by another physician/other qualified health care professional (not separately reported)  Category 3: Discuss management/tests  Discussion of management or test interpretation with external physician/other qualified health care professional/appropriate source (not separately  reported)  HIGH risk of morbidity from additional diagnostic testing or treatment Examples only:  Drug therapy requiring intensive monitoring for toxicity  Decision for elective major surgery with identified pateint or procedure risk factors  Decision regarding hospitalization or escalation of level of care - urgent labs needed and then baed on restuls go to ER  Decision for DNR or to de-escalate care   Parenteral controlled  substances

## 2023-02-16 NOTE — Addendum Note (Signed)
Addended by: Hedda Slade on: 02/16/2023 02:45 PM   Modules accepted: Orders

## 2023-02-16 NOTE — Telephone Encounter (Signed)
Below results give Trop pending D-dimer pending  Rsults given to patient 6:38 PM 02/16/2023    LABS    PULMONARY No results for input(s): "PHART", "PCO2ART", "PO2ART", "HCO3", "TCO2", "O2SAT" in the last 168 hours.  Invalid input(s): "PCO2", "PO2"  CBC Recent Labs  Lab 02/16/23 1508  HGB 12.8*  HCT 39.1  WBC 6.4  PLT 286.0    COAGULATION No results for input(s): "INR" in the last 168 hours.  CARDIAC  No results for input(s): "TROPONINI" in the last 168 hours. Recent Labs  Lab 02/16/23 1508  PROBNP 125.0*     CHEMISTRY Recent Labs  Lab 02/16/23 1508  NA 130*  K 3.6  CL 88*  CO2 35*  GLUCOSE 92  BUN 19  CREATININE 1.01  CALCIUM 9.6   Estimated Creatinine Clearance: 92.8 mL/min (by C-G formula based on SCr of 1.01 mg/dL).  Latest Reference Range & Units 09/03/21 18:54 09/03/21 21:05  Troponin I (High Sensitivity) <18 ng/L 9 10    LIVER Recent Labs  Lab 02/16/23 1508  AST 31  ALT 23  ALKPHOS 60  BILITOT 0.3  PROT 7.5  ALBUMIN 4.6     INFECTIOUS No results for input(s): "LATICACIDVEN", "PROCALCITON" in the last 168 hours.   ENDOCRINE CBG (last 3)  No results for input(s): "GLUCAP" in the last 72 hours.       IMAGING x48h  - image(s) personally visualized  -   highlighted in bold No results found.    Current Outpatient Medications:    albuterol (VENTOLIN HFA) 108 (90 Base) MCG/ACT inhaler, INHALE 2 PUFFS BY MOUTH EVERY 6 HOURS AS NEEDED, Disp: 18 each, Rfl: 3   AMBULATORY NON FORMULARY MEDICATION, Continuous positive airway pressure (CPAP) machine set at autotitration of H2O pressure, with all supplemental supplies as needed., Disp: 1 each, Rfl: 0   aspirin 81 MG chewable tablet, Chew by mouth., Disp: , Rfl:    chlorthalidone (HYGROTON) 25 MG tablet, TAKE 1 TABLET BY MOUTH EVERY DAY, Disp: 90 tablet, Rfl: 1   clopidogrel (PLAVIX) 75 MG tablet, Take 1 tablet (75 mg total) by mouth daily., Disp: 90 tablet, Rfl: 3   colestipol  (COLESTID) 1 g tablet, TAKE 1 TABLET BY MOUTH EVERY DAY, Disp: 30 tablet, Rfl: 0   dicyclomine (BENTYL) 20 MG tablet, TAKE 1 TABLET BY MOUTH THREE TIMES A DAY, Disp: 90 tablet, Rfl: 1   doxycycline (VIBRA-TABS) 100 MG tablet, Take 1 tablet (100 mg total) by mouth 2 (two) times daily., Disp: 10 tablet, Rfl: 0   ENTRESTO 49-51 MG, Take 1 tablet by mouth 2 (two) times daily., Disp: 60 tablet, Rfl: 3   Evolocumab (REPATHA) 140 MG/ML SOSY, Inject 1mL [place] every 14 days., Disp: 2 mL, Rfl: 11   ezetimibe (ZETIA) 10 MG tablet, Take 1 tablet (10 mg total) by mouth at bedtime., Disp: 90 tablet, Rfl: 1   finasteride (PROSCAR) 5 MG tablet, TAKE 1 TABLET (5 MG TOTAL) BY MOUTH DAILY., Disp: 90 tablet, Rfl: 2   ibuprofen (ADVIL) 800 MG tablet, TAKE 1 TABLET BY MOUTH EVERY 8 HOURS AS NEEDED, Disp: 90 tablet, Rfl: 5   KLOR-CON M10 10 MEQ tablet, TAKE 1 TABLET BY MOUTH TWICE A DAY, Disp: 180 tablet, Rfl: 1   loratadine (CLARITIN) 10 MG tablet, Take 10 mg by mouth in the morning., Disp: , Rfl:    Misc. Devices (PULSE OXIMETER) MISC, Monitor oxygen three times per day., Disp: 1 each, Rfl: 0   modafinil (PROVIGIL) 200 MG tablet, TAKE  1 TABLET BY MOUTH EVERY DAY, Disp: 30 tablet, Rfl: 0   ondansetron (ZOFRAN) 8 MG tablet, TAKE 1 TABLET BY MOUTH EVERY 8 HOURS AS NEEDED FOR NAUSEA FOR VOMITING, Disp: 90 tablet, Rfl: 1   pantoprazole (PROTONIX) 40 MG tablet, TAKE 1 TABLET BY MOUTH TWICE A DAY, Disp: 180 tablet, Rfl: 1   sertraline (ZOLOFT) 100 MG tablet, Take 2 tablets (200 mg total) by mouth daily., Disp: 180 tablet, Rfl: 1   triamcinolone (NASACORT) 55 MCG/ACT AERO nasal inhaler, Place 2 sprays into the nose daily as needed (allergies (when pollen is very high))., Disp: , Rfl:   Current Facility-Administered Medications:    sodium chloride flush (NS) 0.9 % injection 3 mL, 3 mL, Intravenous, Q12H, Crenshaw, Madolyn Frieze, MD

## 2023-02-16 NOTE — Telephone Encounter (Signed)
Patient aware refills sent in and verbalized understanding.

## 2023-02-16 NOTE — Telephone Encounter (Signed)
*  STAT* If patient is at the pharmacy, call can be transferred to refill team.   1. Which medications need to be refilled? (please list name of each medication and dose if known) ENTRESTO 49-51 MG   ezetimibe (ZETIA) 10 MG tablet   2. Which pharmacy/location (including street and city if local pharmacy) is medication to be sent to? [id number] [place], [place] - [address]   3. Do they need a 30 day or 90 day supply? 90   Patient is out of medication

## 2023-02-16 NOTE — Addendum Note (Signed)
Addended by: Jethro Bastos, [place] Hollen[place] I on: 02/16/2023 03:08 PM   Modules accepted: Orders

## 2023-02-16 NOTE — Telephone Encounter (Signed)
Please see duplicate encounter

## 2023-02-16 NOTE — Telephone Encounter (Signed)
Lewayne Bunting, MD  to Freddi Starr, RN     02/16/23 10:34 AM Refill Olga Millers Me  to Lewayne Bunting, MD      02/16/23  7:44 AM Okay to refill? Thank you!  Refills sent to patient preferred pharmacy.

## 2023-02-16 NOTE — Patient Instructions (Addendum)
Acute bronchitis Acute on chronic hypoxemic respiratory failure ILD (interstitial lung disease) (HCC)   You have had ILD - possible onset spring 2021 -> definitely seen in  2022 and more progressive May 2023 -> you seem improved Oct/Nov 2023 -> stable Feb 2024 and clinically stable in April 2024.  Drop oxygen to 89% after 5 sit/stand on 01/05/2023. But on 02/16/2023 -subacute decompensation with bronchitis symptoms.  Need to rule out pulmonary embolism or MI or heart failure of interstitial lung disease flareup =   Plan -Depo-Medrol 80 mg IM x 1 in the office  - no oral steroid due anxiety side effects  -If further steroids felt needed then we can do it in the office or infusion center on a daily basis for a few days  - Take doxycycline 100mg  po twice daily x 5 days; take after meals and avoid sunlight -Check blood work for CBC with differential, chemistry, liver function test, troponin, BNP and D-dimer and ESR  - run it stat  -If these are abnormal we will have to send you to the emergency department  -Continue oxygen therapy to keep your pulse ox goal greater than 88%  -Do high-resolution CT chest supine and prone in 1-2 weeks  Followup -Return to see Dr. Marchelle Gearing or nurse practitioner in 1-2 weeks but after completing high-resolution CT chest

## 2023-02-17 ENCOUNTER — Telehealth: Payer: Self-pay | Admitting: Internal Medicine

## 2023-02-17 NOTE — Telephone Encounter (Signed)
Good new d-dimer normal. Please let him know because is an urgent result. Waiting for troponin. I gave other reeulst yesterday

## 2023-02-17 NOTE — Telephone Encounter (Signed)
Spoke to pt and gave him the results of his d-dimer at Dr Jane Canary request. Pt verbalized understanding nothing further needed.

## 2023-02-20 LAB — TROPONIN T, HIGH SENSITIVITY (HS-TNT): Troponin T (Highly Sensitive): 20 ng/L (ref <23)

## 2023-02-20 LAB — D-DIMER, QUANTITATIVE: D-Dimer, Quant: 0.27 mcg/mL FEU (ref <0.50)

## 2023-02-21 ENCOUNTER — Other Ambulatory Visit: Payer: Self-pay | Admitting: Physician Assistant

## 2023-02-21 DIAGNOSIS — I5022 Chronic systolic (congestive) heart failure: Secondary | ICD-10-CM

## 2023-02-21 DIAGNOSIS — J849 Interstitial pulmonary disease, unspecified: Secondary | ICD-10-CM

## 2023-02-21 DIAGNOSIS — J9611 Chronic respiratory failure with hypoxia: Secondary | ICD-10-CM

## 2023-02-23 ENCOUNTER — Ambulatory Visit (INDEPENDENT_AMBULATORY_CARE_PROVIDER_SITE_OTHER): Payer: Medicare HMO

## 2023-02-23 DIAGNOSIS — I5022 Chronic systolic (congestive) heart failure: Secondary | ICD-10-CM | POA: Diagnosis not present

## 2023-02-23 LAB — ECHOCARDIOGRAM COMPLETE
AR max vel: 3.62 cm2
AV Area VTI: 3.39 cm2
AV Area mean vel: 3.1 cm2
AV Mean grad: 2 mmHg
AV Peak grad: 2.9 mmHg
Ao pk vel: 0.85 m/s
Area-P 1/2: 4.96 cm2
Calc EF: 32.9 %
Est EF: 25
MV M vel: 2.66 m/s
MV Peak grad: 28.2 mmHg
S' Lateral: 5.65 cm
Single Plane A2C EF: 23.9 %
Single Plane A4C EF: 37.1 %

## 2023-02-23 MED ORDER — PERFLUTREN LIPID MICROSPHERE
1.0000 mL | INTRAVENOUS | Status: AC | PRN
Start: 2023-02-23 — End: 2023-02-23
  Administered 2023-02-23: 3 mL via INTRAVENOUS

## 2023-02-24 NOTE — Progress Notes (Addendum)
HPI: Follow-up CAD.  CPX March 2022 showed submaximal effort, moderate functional impairment due to pulmonary restriction.  High resolution chest CT March 2022 showed interstitial lung disease and aortic atherosclerosis; enlarged pulmonary trunk suggestive of pulmonary hypertension.  Cardiac catheterization March 2022 showed normal left main, minimal irregularities in the LAD, 2 small diagonals, circumflex, 2 obtuse marginals, posterior lateral, PDA and nondominant right coronary artery; ejection fraction 30 to 35% with mild left ventricular enlargement.  Echocardiogram June 2023 showed ejection fraction 40 to 45%, grade 1 diastolic dysfunction, moderate left atrial enlargement, mild mitral regurgitation.  Right heart catheterization June 2023 showed cardiac output 6.59 L/min, cardiac index 2.96 L/min, RA pressure 11, PA pressure 25/12 and pulmonary capillary wedge pressure 8. CTA January 2024 showed pneumonitis and atheromatous changes of the thoracic aorta but no aneurysm.  Patient admitted to [facility] March 2024 with STEMI.  Cardiac catheterization revealed 95 to 99% mid circumflex, 30% LAD, 50% PDA.  Patient had PCI of the left circumflex with drug-eluting stent.  Echocardiogram repeated February 23, 2023 and showed ejection fraction 25%, hypokinesis of the anterolateral and inferoseptal walls, severe left ventricular enlargement, mild left ventricular hypertrophy, grade 1 diastolic dysfunction, mild left atrial enlargement, mild mitral regurgitation and mildly dilated aortic root at 42 mm.  High-resolution chest CT June 2024 showed interstitial lung disease but no aneurysm. Since last seen patient does have some dyspnea on exertion after recent viral infection.  No orthopnea, PND, pedal edema, chest pain or syncope.  Current Outpatient Medications  Medication Sig Dispense Refill   albuterol (VENTOLIN HFA) 108 (90 Base) MCG/ACT inhaler INHALE 2 PUFFS BY MOUTH EVERY 6 HOURS AS NEEDED 18 each 3    AMBULATORY NON FORMULARY MEDICATION Continuous positive airway pressure (CPAP) machine set at autotitration of H2O pressure, with all supplemental supplies as needed. 1 each 0   chlorthalidone (HYGROTON) 25 MG tablet TAKE 1 TABLET BY MOUTH EVERY DAY 90 tablet 1   clopidogrel (PLAVIX) 75 MG tablet Take 1 tablet (75 mg total) by mouth daily. 90 tablet 3   colestipol (COLESTID) 1 g tablet TAKE 1 TABLET BY MOUTH EVERY DAY 30 tablet 0   dicyclomine (BENTYL) 20 MG tablet TAKE 1 TABLET BY MOUTH THREE TIMES A DAY 90 tablet 1   doxycycline (VIBRA-TABS) 100 MG tablet Take 1 tablet (100 mg total) by mouth 2 (two) times daily. 10 tablet 0   ENTRESTO 49-51 MG Take 1 tablet by mouth 2 (two) times daily. 60 tablet 3   Evolocumab (REPATHA) 140 MG/ML SOSY Inject 1mL [place] every 14 days. 2 mL 11   ezetimibe (ZETIA) 10 MG tablet Take 1 tablet (10 mg total) by mouth at bedtime. 90 tablet 1   finasteride (PROSCAR) 5 MG tablet TAKE 1 TABLET (5 MG TOTAL) BY MOUTH DAILY. 90 tablet 2   ibuprofen (ADVIL) 800 MG tablet TAKE 1 TABLET BY MOUTH EVERY 8 HOURS AS NEEDED 90 tablet 5   KLOR-CON M10 10 MEQ tablet TAKE 1 TABLET BY MOUTH TWICE A DAY 180 tablet 1   loratadine (CLARITIN) 10 MG tablet Take 10 mg by mouth in the morning.     Misc. Devices (PULSE OXIMETER) MISC Monitor oxygen three times per day. 1 each 0   modafinil (PROVIGIL) 200 MG tablet TAKE 1 TABLET BY MOUTH EVERY DAY 30 tablet 0   ondansetron (ZOFRAN) 8 MG tablet TAKE 1 TABLET BY MOUTH EVERY 8 HOURS AS NEEDED FOR NAUSEA FOR VOMITING 90 tablet 1  pantoprazole (PROTONIX) 40 MG tablet TAKE 1 TABLET BY MOUTH TWICE A DAY 180 tablet 1   triamcinolone (NASACORT) 55 MCG/ACT AERO nasal inhaler Place 2 sprays into the nose daily as needed (allergies (when pollen is very high)).     sertraline (ZOLOFT) 100 MG tablet Take 2 tablets (200 mg total) by mouth daily. 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    Past Surgical History:  Procedure Laterality Date   ANKLE FRACTURE SURGERY Left    APPENDECTOMY     BIOPSY  11/18/2021   Procedure: BIOPSY;  Surgeon: Shellia Cleverly, DO;  Location: [facility];  Service: Gastroenterology;;  EGD and COLON   CERVICAL FUSION     CHOLECYSTECTOMY     COLON SURGERY     COLONOSCOPY WITH PROPOFOL N/A 11/18/2021   Procedure: COLONOSCOPY WITH PROPOFOL;  Surgeon: Shellia Cleverly, DO;  Location: [facility];  Service: Gastroenterology;  Laterality: N/A;   ESOPHAGOGASTRODUODENOSCOPY (EGD) WITH PROPOFOL N/A 11/18/2021   Procedure: ESOPHAGOGASTRODUODENOSCOPY (EGD) WITH PROPOFOL;  Surgeon: Shellia Cleverly, DO;  Location: [facility];  Service: Gastroenterology;  Laterality: N/A;   HERNIA REPAIR     POLYPECTOMY  11/18/2021   Procedure: POLYPECTOMY;  Surgeon: Shellia Cleverly, DO;  Location: [facility];  Service: Gastroenterology;;   RIGHT HEART CATH N/A 02/13/2022   Procedure: RIGHT HEART CATH;  Surgeon: Kathleene Hazel, MD;  Location: [facility]VASIVE CV LAB;  Service: Cardiovascular;  Laterality: N/A;    Social History   Socioeconomic History   Marital status: Married    Spouse name: Dawn   Number of children: 1   Years of education: 14   Highest education level: Some college, no degree  Occupational History   Occupation: Disabled  Tobacco Use   Smoking status: Former    Packs/day: 1.00    Years: 28.00    Additional pack years: 0.00    Total pack years: 28.00    Types: Cigarettes    Start date: 1982    Quit date: 2010    Years since quitting: 14.4   Smokeless tobacco: Never  Vaping Use    Vaping Use: Never used  Substance and Sexual Activity   Alcohol use: No   Drug use: No    Comment: 04/06/2018 Patient's wife wanted [place] to be very aware that her husband is a recovering narcotic abuser   Sexual activity: Yes  Other Topics Concern   Not on file  Social History Narrative   Lives with his wife, daughter, son-in-law and 2 two grandchildren. Likes to spend to time with grandchildren. Does not exercise due to back issues in the past and respiratory problems.   Social Determinants of Health   Financial Resource Strain: Low Risk  (02/04/2022)   Overall Financial Resource Strain (CARDIA)    Difficulty of Paying Living Expenses: Not hard at all  Food Insecurity: No Food Insecurity (02/04/2022)   Hunger Vital Sign    Worried About Running Out of Food in the Last Year:  Never true    Ran Out of Food in the Last Year: Never true  Transportation Needs: No Transportation Needs (02/04/2022)   PRAPARE - Administrator, Civil Service (Medical): No    Lack of Transportation (Non-Medical): No  Physical Activity: Inactive (02/04/2022)   Exercise Vital Sign    Days of Exercise per Week: 0 days    Minutes of Exercise per Session: 0 min  Stress: No Stress Concern Present (02/04/2022)   [employer] of Occupational Health - Occupational Stress Questionnaire    Feeling of Stress : Not at all  Social Connections: Moderately Integrated (02/04/2022)   Social Connection and Isolation Panel [NHANES]    Frequency of Communication with Friends and Family: Once a week    Frequency of Social Gatherings with Friends and Family: More than three times a week    Attends Religious Services: More than 4 times per year    Active Member of [employer] or Organizations: No    Attends Banker Meetings: Never    Marital Status: Married  Catering manager Violence: Not At Risk (02/04/2022)   Humiliation, Afraid, Rape, and Kick questionnaire    Fear of Current or Ex-Partner: No    Emotionally  Abused: No    Physically Abused: No    Sexually Abused: No    Family History  Problem Relation Age of Onset   Cancer Mother        breast   Depression Mother    Hyperlipidemia Mother    Dementia Mother    Alcohol abuse Father    Cancer Father        skin   Hyperlipidemia Father    Alzheimer's disease Father    Cancer Maternal Grandfather    Cancer Paternal Grandmother    Pancreatic cancer Paternal Uncle    Colon cancer Neg Hx    Esophageal cancer Neg Hx    Stomach cancer Neg Hx     ROS: no fevers or chills, productive cough, hemoptysis, dysphasia, odynophagia, melena, hematochezia, dysuria, hematuria, rash, seizure activity, orthopnea, PND, pedal edema, claudication. Remaining systems are negative.  Physical Exam: Well-developed well-nourished in no acute distress.  Skin is warm and dry.  HEENT is normal.  Neck is supple.  Chest is clear to auscultation with normal expansion.  Cardiovascular exam is regular rate and rhythm.  Abdominal exam nontender or distended. No masses palpated. Extremities show no edema. neuro grossly intact  A/P  1 coronary artery disease-patient denies chest pain.  Continue medical therapy with plavix.  He is intolerant to statins.  2 combined ischemic/nonischemic cardiomyopathy-continue Entresto.  Add spironolactone 12.5 mg daily and Toprol 12.5 mg daily.  He declines SGLT2 inhibitor.  Given that his LV function remains decreased we will arrange EP evaluation for CRT-D.  I will forward a note to Dr. Marchelle Gearing concerning prognosis from a pulmonary standpoint.  I think his life expectancy is greater than 1 year for his interstitial lung disease.  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  Continue present medications.  Add spironolactone as described above.  He declines SGLT2 inhibitor.  4 hyperlipidemia-continue Zetia.  He is having side effects from Repatha.  Will refer back to lipid  clinic to see if there is a benefit from considering Praluent.  5 hypertension-patient's blood pressure is controlled.  Continue present medical regimen.  6 sleep apnea-continue CPAP.  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  8 interstitial lung disease-managed by pulmonary.  Will forward to Dr. Marchelle Gearing concerning prognosis as outlined above prior to CRT-D.  Olga Millers, MD Addendum-I spoke with Dr. Marchelle Gearing concerning the patient's prognosis from a pulmonary standpoint.  He felt it was a bit uncertain but had been stable over the past 1 year on antifibrotics.  He felt like if ICD was indicated patient should proceed.  Will arrange evaluation with electrophysiology. Olga Millers, MD

## 2023-02-25 ENCOUNTER — Telehealth: Payer: Medicare HMO | Admitting: Psychiatry

## 2023-02-25 ENCOUNTER — Ambulatory Visit (HOSPITAL_BASED_OUTPATIENT_CLINIC_OR_DEPARTMENT_OTHER)
Admission: RE | Admit: 2023-02-25 | Discharge: 2023-02-25 | Disposition: A | Discharge status: Home / Self Care | Payer: Medicare HMO | Source: Ambulatory Visit | Attending: Internal Medicine | Admitting: Internal Medicine

## 2023-02-25 ENCOUNTER — Telehealth: Payer: Self-pay | Admitting: Cardiology

## 2023-02-25 DIAGNOSIS — J849 Interstitial pulmonary disease, unspecified: Secondary | ICD-10-CM | POA: Insufficient documentation

## 2023-02-25 DIAGNOSIS — J479 Bronchiectasis, uncomplicated: Secondary | ICD-10-CM | POA: Diagnosis not present

## 2023-02-25 NOTE — Telephone Encounter (Signed)
New Message:      Patient says he is havinga CT of the chest at 9:30 this morning. He wants to know while he is having this test, does Dr Jens Som wants him to have a CT of his heart? If so, he wants to do it while he is there

## 2023-02-25 NOTE — Telephone Encounter (Signed)
Call to patient.  Advised the CT of heart cannot be added today as would need approval from insurance.  He states understanding then wife gets on the phone and wants doctor to review labs and let her know what is going on.  Advised he did see labs he only noted on the one that he prescribes medication for but he has seen and reviewed the other.  She starts to argue and is rude about I am not listening to her and I have to come  to point of stopping to say I am not sure why there is conflict with the call as I am answering her questions and the doctor is doing test and will notify of any concerns or immediate needs. I advised the appointment is for review of those test and any more questions or concerns they may have.  She states she thought I was telling her he would not review any until the appointment and I again assured her if any concerning results. They would be notified prior to the appt.  Again the appt will be to further review, answer questions, and discuss future recommendations. She states understanding .  ......PLEASE CALL if any concerns she states "as this I my husband and I want to know anything concerning him".

## 2023-02-26 ENCOUNTER — Ambulatory Visit (INDEPENDENT_AMBULATORY_CARE_PROVIDER_SITE_OTHER): Payer: Medicare HMO | Admitting: Physician Assistant

## 2023-02-26 VITALS — Ht 71.0 in | Wt 227.6 lb

## 2023-02-26 DIAGNOSIS — J9611 Chronic respiratory failure with hypoxia: Secondary | ICD-10-CM | POA: Diagnosis not present

## 2023-02-26 DIAGNOSIS — J40 Bronchitis, not specified as acute or chronic: Secondary | ICD-10-CM

## 2023-02-26 DIAGNOSIS — J302 Other seasonal allergic rhinitis: Secondary | ICD-10-CM | POA: Insufficient documentation

## 2023-02-26 DIAGNOSIS — J849 Interstitial pulmonary disease, unspecified: Secondary | ICD-10-CM

## 2023-02-26 MED ORDER — METHYLPREDNISOLONE SODIUM SUCC 125 MG IJ SOLR
125.0000 mg | Freq: Once | INTRAMUSCULAR | Status: AC
Start: 2023-02-26 — End: 2023-02-26
  Administered 2023-02-26: 125 mg via INTRAMUSCULAR

## 2023-02-26 NOTE — Telephone Encounter (Signed)
Spoke with pt, he is aware of his appointment next week with [place] in [place].

## 2023-02-26 NOTE — Progress Notes (Signed)
Pt presented for solumedrol 125mg  injection per [place]. Tolerated well.   Location: [facility]

## 2023-02-26 NOTE — Progress Notes (Signed)
..  Jake [name] was seen today for injections.  Diagnoses and all orders for this visit:  Chronic respiratory failure with hypoxia (HCC) -     methylPREDNISolone sodium succinate (SOLU-MEDROL) 125 mg/2 mL injection 125 mg  ILD (interstitial lung disease) (HCC)  Bronchitis  Seasonal allergies   Pt does not tolerate oral prednisone. Last shot over a week ago with some improvement. 2nd shot given in office today.

## 2023-03-01 ENCOUNTER — Encounter: Payer: Self-pay | Admitting: Internal Medicine

## 2023-03-01 ENCOUNTER — Ambulatory Visit: Payer: Medicare HMO | Admitting: Cardiology

## 2023-03-03 ENCOUNTER — Ambulatory Visit: Payer: Medicare HMO | Admitting: Cardiology

## 2023-03-03 ENCOUNTER — Encounter: Payer: Self-pay | Admitting: Cardiology

## 2023-03-03 VITALS — BP 118/62 | HR 100 | Ht 71.0 in | Wt 223.0 lb

## 2023-03-03 DIAGNOSIS — I2510 Atherosclerotic heart disease of native coronary artery without angina pectoris: Secondary | ICD-10-CM | POA: Diagnosis not present

## 2023-03-03 DIAGNOSIS — I5022 Chronic systolic (congestive) heart failure: Secondary | ICD-10-CM

## 2023-03-03 DIAGNOSIS — J849 Interstitial pulmonary disease, unspecified: Secondary | ICD-10-CM | POA: Diagnosis not present

## 2023-03-03 DIAGNOSIS — E782 Mixed hyperlipidemia: Secondary | ICD-10-CM

## 2023-03-03 DIAGNOSIS — I420 Dilated cardiomyopathy: Secondary | ICD-10-CM

## 2023-03-03 MED ORDER — METOPROLOL SUCCINATE ER 25 MG PO TB24: 12.5000 mg | ORAL_TABLET | Freq: Every day | ORAL | 3 refills | Status: DC

## 2023-03-03 MED ORDER — SPIRONOLACTONE 25 MG PO TABS: 12.5000 mg | ORAL_TABLET | Freq: Every day | ORAL | 3 refills | Status: DC

## 2023-03-03 NOTE — Patient Instructions (Signed)
Medication Instructions:   STOP REPATHA  START METOPROLOL SUCC ER 12.5 MG ONCE DAILY AT BEDTIME=1/2 OF THE 25 MG TABLET ONCE DAILY  START SPIRONOLACTONE 12.5 MG ONCE DAILY= 1/2 OF THE 25 MG TABLET ONCE DAILY  *If you need a refill on your cardiac medications before your next appointment, please call your pharmacy*   Lab Work:  Your physician recommends that you return for lab work in: 1 WEEK-DO NOT NEED TO FAST  [facility]  Located on the 3 rd floor in [address] Hours-Monday - Friday 8 am-11:30 AM and 1 pm -4 pm   If you have labs (blood work) drawn today and your tests are completely normal, you will receive your results only by: MyChart Message (if you have MyChart) OR A paper copy in the mail If you have any lab test that is abnormal or we need to change your treatment, we will call you to review the results.   Follow-Up: At [facility], you and your health needs are our priority.  As part of our continuing mission to provide you with exceptional heart care, we have created designated Provider Care Teams.  These Care Teams include your primary Cardiologist (physician) and Advanced Practice Providers (APPs -  Physician Assistants and Nurse Practitioners) who all work together to provide you with the care you need, when you need it.  We recommend signing up for the patient portal called "MyChart".  Sign up information is provided on this After Visit Summary.  MyChart is used to connect with patients for Virtual Visits (Telemedicine).  Patients are able to view lab/test results, encounter notes, upcoming appointments, etc.  Non-urgent messages can be sent to your provider as well.   To learn more about what you can do with MyChart, go to [email or web address].    Your next appointment:   8 month(s)  Provider:   Olga Millers, MD

## 2023-03-08 ENCOUNTER — Other Ambulatory Visit: Payer: Self-pay | Admitting: Physician Assistant

## 2023-03-08 ENCOUNTER — Encounter: Payer: Self-pay | Admitting: Cardiology

## 2023-03-09 ENCOUNTER — Ambulatory Visit: Payer: Medicare HMO | Attending: Cardiology | Admitting: Pharmacist Clinician (PhC)/ Clinical Pharmacy Specialist

## 2023-03-09 ENCOUNTER — Encounter: Payer: Self-pay | Admitting: Pharmacist Clinician (PhC)/ Clinical Pharmacy Specialist

## 2023-03-09 ENCOUNTER — Other Ambulatory Visit: Payer: Self-pay | Admitting: Adult Health

## 2023-03-09 ENCOUNTER — Telehealth: Payer: Self-pay | Admitting: Pharmacist Clinician (PhC)/ Clinical Pharmacy Specialist

## 2023-03-09 DIAGNOSIS — E782 Mixed hyperlipidemia: Secondary | ICD-10-CM

## 2023-03-09 DIAGNOSIS — G4733 Obstructive sleep apnea (adult) (pediatric): Secondary | ICD-10-CM

## 2023-03-09 NOTE — Progress Notes (Signed)
Office Visit    Patient Name: Jake [name] Date of Encounter: 03/09/2023  Primary Care Provider:  Nolene Ebbs Primary Cardiologist:  Olga Millers, MD  Chief Complaint    Hyperlipidemia   Significant Past Medical History   CAD 3/24 NSTEMI, DES to LCx, on clopidogrel  CHF 11/23 EF 30-35% by echo - on Entresto, metoprolol, spironolactone, declined SGLT2  LBBB   HTN Controlled on chlorthalidone, spironolactone, Entresto  OSA On CPAP     Allergies  Allergen Reactions   Fentanyl Other (See Comments)    Avoid opiates Other reaction(s): Other Avoid opiates   Other     04/06/18 Patient is a recovering narcotic abuser and, per wife, is NEVER to receive controlled substances (e.g. Narcotics, sedatives, etc.) Other reaction(s): Unknown 04/06/18 Patient is a recovering narcotic abuser and, per wife, is NEVER to receive controlled substances (e.g. Narcotics, sedatives, etc.)   Aspirin Nausea And Vomiting   Lipitor [Atorvastatin] Nausea And Vomiting    GI side effects.     Prednisone Other (See Comments)     Agitation Severe anger outbursts   Ofev [Nintedanib]     GI side effects   Pirfenidone    Contrast Media [Iodinated Contrast Media] Palpitations    Patient states he tolerates now   Nitroglycerin Nausea And Vomiting    History of Present Illness    Jake [name] is a 63 y.o. male patient of Dr Jens Som, in the office today to discuss other options, as he noted side effects from Repatha.  He was seen by Carmela Hurt in early May and noted that he already initiated Repatha and he took his 1st dose 2 weeks prior. His second dose was due day of the appointment.  At the time he noted tolerating it well without any side effects. He has made some significant diet changes after MI in March. But with his diverticulitis food choice becomes limited. Advised to consult nutritionist. Patient was concern about the co-pay cost when he hit the coverage gap. He has lost almost  30 lbs in couple years was 260 lbs now he weighs 225 lbs. His goal is to go down to 200 lbs. He has tried [place] in the past for weight loss but he could not tolerate it due to sever nausea and constipation. 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   Today he is in the office due to side effects from Repatha.   He notes that for the first day or two after his dose he was unable to get out of bed (fatigue, joint pain, back pain, nausea), then would feel "crummy" for another 5-6 days and finally feel like himself by about day 10-12. Has taken 5-6 doses and feels like things have gotten worse with each dose.     Insurance Carrier:  [employer]  LDL Cholesterol goal:  LDL <55  Current Medications:   colestpol 1 gm every day, ezetimibe 10 mg qd  Previously tried:  rosuvastatin, pitavastatin - irritated IBS  Family History:  Father: HDL, dementia Mother: demetia, HDL  Paternal uncle: pancreatic cancer  Maternal GF: brain tumor PGM: died young unknown reason Brother: healthy age 56  Daughter -26 healthy   Social History:  Alcohol: none 30 years sober Smoke: quit 20 years ago     Diet: does not eat out a lot and cut down on regular sodas. Eats more fish and chicken and lots of greens with minimum  salads dressings.     Exercise: walk a dog couple times per day, household chores; no formal exercise  Adherence Assessment  Do you ever forget to take your medication? [] Yes [x] No  Do you ever skip doses due to side effects? [] Yes [x] No  Do you have trouble affording your medicines? [x] Yes - Entresto, Repatha [] No  Are you ever unable to pick up your medication due to transportation difficulties? [] Yes [x] No   Adherence strategy: none   Accessory Clinical Findings   Lab Results  Component Value Date   CHOL 91 02/16/2023   HDL 42.60 02/16/2023   LDLCALC 22 02/16/2023   TRIG 128.0 02/16/2023   CHOLHDL 2  02/16/2023    No results found for: "LIPOA"  Lab Results  Component Value Date   ALT 23 02/16/2023   AST 31 02/16/2023   ALKPHOS 60 02/16/2023   BILITOT 0.3 02/16/2023   Lab Results  Component Value Date   CREATININE 1.01 02/16/2023   BUN 19 02/16/2023   NA 130 (L) 02/16/2023   K 3.6 02/16/2023   CL 88 (L) 02/16/2023   CO2 35 (H) 02/16/2023   Lab Results  Component Value Date   HGBA1C 5.9 (A) 06/10/2021    Home Medications    Current Outpatient Medications  Medication Sig Dispense Refill   albuterol (VENTOLIN HFA) 108 (90 Base) MCG/ACT inhaler INHALE 2 PUFFS BY MOUTH EVERY 6 HOURS AS NEEDED 18 each 3   AMBULATORY NON FORMULARY MEDICATION Continuous positive airway pressure (CPAP) machine set at autotitration of H2O pressure, with all supplemental supplies as needed. 1 each 0   chlorthalidone (HYGROTON) 25 MG tablet TAKE 1 TABLET BY MOUTH EVERY DAY 90 tablet 1   clopidogrel (PLAVIX) 75 MG tablet Take 1 tablet (75 mg total) by mouth daily. 90 tablet 3   colestipol (COLESTID) 1 g tablet TAKE 1 TABLET BY MOUTH EVERY DAY 30 tablet 0   dicyclomine (BENTYL) 20 MG tablet TAKE 1 TABLET BY MOUTH THREE TIMES A DAY 90 tablet 1   doxycycline (VIBRA-TABS) 100 MG tablet Take 1 tablet (100 mg total) by mouth 2 (two) times daily. 10 tablet 0   ENTRESTO 49-51 MG Take 1 tablet by mouth 2 (two) times daily. 60 tablet 3   ezetimibe (ZETIA) 10 MG tablet Take 1 tablet (10 mg total) by mouth at bedtime. 90 tablet 1   finasteride (PROSCAR) 5 MG tablet TAKE 1 TABLET (5 MG TOTAL) BY MOUTH DAILY. 90 tablet 2   ibuprofen (ADVIL) 800 MG tablet TAKE 1 TABLET BY MOUTH EVERY 8 HOURS AS NEEDED 90 tablet 5   KLOR-CON M10 10 MEQ tablet TAKE 1 TABLET BY MOUTH TWICE A DAY 180 tablet 1   loratadine (CLARITIN) 10 MG tablet Take 10 mg by mouth in the morning.     metoprolol succinate (TOPROL XL) 25 MG 24 hr tablet Take 0.5 tablets (12.5 mg total) by mouth at bedtime. 45 tablet 3   Misc. Devices (PULSE OXIMETER)  MISC Monitor oxygen three times per day. 1 each 0   modafinil (PROVIGIL) 200 MG tablet TAKE 1 TABLET BY MOUTH EVERY DAY 30 tablet 0   ondansetron (ZOFRAN) 8 MG tablet TAKE 1 TABLET BY MOUTH EVERY 8 HOURS AS NEEDED FOR NAUSEA FOR VOMITING 90 tablet 1   pantoprazole (PROTONIX) 40 MG tablet TAKE 1 TABLET BY MOUTH TWICE A DAY 180 tablet 1   sertraline (ZOLOFT) 100 MG tablet Take 2 tablets (200 mg total) by mouth daily. 180 tablet 1  spironolactone (ALDACTONE) 25 MG tablet Take 0.5 tablets (12.5 mg total) by mouth daily. 45 tablet 3   triamcinolone (NASACORT) 55 MCG/ACT AERO nasal inhaler Place 2 sprays into the nose daily as needed (allergies (when pollen is very high)).     Current Facility-Administered Medications  Medication Dose Route Frequency Provider Last Rate Last Admin   sodium chloride flush (NS) 0.9 % injection 3 mL  3 mL Intravenous Q12H Lewayne Bunting, MD         Assessment & Plan    Hyperlipidemia Assessment: Patient with ASCVD was at LDL goal of < 55 while on Repatha Took last dose 12 days ago Most recent LDL 22 on 02/16/23 Has been compliant with Repatha, has to d/c 2/2/ side effects   Reviewed options for lowering LDL cholesterol including bempedoic acid and inclisiran.  Discussed mechanisms of action, dosing, side effects, potential decreases in LDL cholesterol and costs.  Also reviewed potential options for patient assistance.  Plan: Will need to repeat lipid panel in 6 weeks (8 wks after last Repatha dose) Once labs resulted, will review with patient to decide between [facility] and Nexlizet    Phillips Hay, PharmD CPP [facility] [address]  [place], [place] [address] [phone]  03/09/2023, 4:14 PM

## 2023-03-09 NOTE — Telephone Encounter (Signed)
[employer] grant for cardiomyopathy approved to 02/06/24  ID      [id number] BIN    [id number] PCN   [id number] GRP   [id number]

## 2023-03-09 NOTE — Patient Instructions (Signed)
If you have any questions or concerns please reach out to me:  Phillips Hay PharmD  Medication changes:  After we get the cholesterol labs re-checked, we can decide between Nexlizet (tablet) or Leqvio (injection)  Lab orders:  We want to repeat labs after 6 weeks.  We will send you a lab order to remind you once we get closer to that time.     Thank you for choosing [facility]

## 2023-03-09 NOTE — Assessment & Plan Note (Signed)
Assessment: Patient with ASCVD was at LDL goal of < 55 while on Repatha Took last dose 12 days ago Most recent LDL 22 on 02/16/23 Has been compliant with Repatha, has to d/c 2/2/ side effects   Reviewed options for lowering LDL cholesterol including bempedoic acid and inclisiran.  Discussed mechanisms of action, dosing, side effects, potential decreases in LDL cholesterol and costs.  Also reviewed potential options for patient assistance.  Plan: Will need to repeat lipid panel in 6 weeks (8 wks after last Repatha dose) Once labs resulted, will review with patient to decide between [facility] and Nexlizet

## 2023-03-10 ENCOUNTER — Ambulatory Visit: Payer: Medicare HMO

## 2023-03-12 DIAGNOSIS — I5022 Chronic systolic (congestive) heart failure: Secondary | ICD-10-CM | POA: Diagnosis not present

## 2023-03-13 LAB — BASIC METABOLIC PANEL
BUN/Creatinine Ratio: 19 (ref 10–24)
BUN: 22 mg/dL (ref 8–27)
CO2: 28 mmol/L (ref 20–29)
Calcium: 10 mg/dL (ref 8.6–10.2)
Chloride: 92 mmol/L — ABNORMAL LOW (ref 96–106)
Creatinine, Ser: 1.18 mg/dL (ref 0.76–1.27)
Glucose: 92 mg/dL (ref 70–99)
Potassium: 4.2 mmol/L (ref 3.5–5.2)
Sodium: 137 mmol/L (ref 134–144)
eGFR: 70 mL/min/{1.73_m2} (ref >59)

## 2023-03-15 ENCOUNTER — Other Ambulatory Visit: Payer: Self-pay | Admitting: Physician Assistant

## 2023-03-15 DIAGNOSIS — I5022 Chronic systolic (congestive) heart failure: Secondary | ICD-10-CM

## 2023-03-15 DIAGNOSIS — J849 Interstitial pulmonary disease, unspecified: Secondary | ICD-10-CM

## 2023-03-15 DIAGNOSIS — J9611 Chronic respiratory failure with hypoxia: Secondary | ICD-10-CM

## 2023-03-17 ENCOUNTER — Ambulatory Visit: Payer: Medicare HMO | Admitting: Psychiatry

## 2023-03-22 ENCOUNTER — Encounter: Payer: Self-pay | Admitting: Cardiology

## 2023-03-22 ENCOUNTER — Encounter: Payer: Self-pay | Admitting: *Deleted

## 2023-03-22 ENCOUNTER — Ambulatory Visit: Payer: Medicare HMO | Attending: Cardiology | Admitting: Cardiology

## 2023-03-22 VITALS — BP 112/78 | HR 82 | Ht 71.0 in | Wt 227.0 lb

## 2023-03-22 DIAGNOSIS — Z01812 Encounter for preprocedural laboratory examination: Secondary | ICD-10-CM

## 2023-03-22 DIAGNOSIS — I5022 Chronic systolic (congestive) heart failure: Secondary | ICD-10-CM

## 2023-03-22 NOTE — Patient Instructions (Signed)
Medication Instructions:  Your physician recommends that you continue on your current medications as directed. Please refer to the Current Medication list given to you today.     * If you need a refill on your cardiac medications before your next appointment, please call your pharmacy. *   Labwork: Pre procedure lab work today: BMET & CBC  If you have labs (blood work) drawn today and your tests are completely normal, you will receive your results only by: [employer] (if you have MyChart) OR A paper copy in the mail If you have any lab test that is abnormal or we need to change your treatment, we will call you to review the results.   Testing/Procedures: Your physician has recommended that you have a defibrillator inserted. An implantable cardioverter defibrillator (ICD) is a small device that is placed in your chest or, in rare cases, your abdomen. This device uses electrical pulses or shocks to help control life-threatening, irregular heartbeats that could lead the heart to suddenly stop beating (sudden cardiac arrest). Leads are attached to the ICD that goes into your heart. This is done in the hospital and usually requires an overnight stay. Please follow the instruction letter given to you today   Follow-Up: Your physician recommends that you schedule a wound check appointment 10-14 days, after your procedure on 04/14/23, with the device clinic.  Your physician recommends that you schedule a follow up appointment in 91 days, after your procedure on 04/14/23, with Dr. Elberta Fortis.   Thank you for choosing [facility]!!   Dory Horn, RN [phone]    Other Instructions Cardioverter Defibrillator Implantation An implantable cardioverter defibrillator (ICD) is a small, lightweight, battery-powered device that is placed (implanted) under the skin in the chest or abdomen. Your caregiver may prescribe an ICD if: You have had an irregular heart rhythm (arrhythmia) that  originated in the lower chambers of the heart (ventricles). Your heart has been damaged by a disease (such as coronary artery disease) or heart condition (such as a heart attack). An ICD consists of a battery that lasts several years, a small computer called a pulse generator, and wires called leads that go into the heart. It is used to detect and correct two dangerous arrhythmias: a rapid heart rhythm (tachycardia) and an arrhythmia in which the ventricles contract in an uncoordinated way (fibrillation). When an ICD detects tachycardia, it sends an electrical signal to the heart that restores the heartbeat to normal (cardioversion). This signal is usually painless. If cardioversion does not work or if the ICD detects fibrillation, it delivers a small electrical shock to the heart (defibrillation) to restart the heart. The shock may feel like a strong jolt in the chest. ICDs may be programmed to correct other problems. Sometimes, ICDs are programmed to act as another type of implantable device called a pacemaker. Pacemakers are used to treat a slow heartbeat (bradycardia). LET YOUR CAREGIVER KNOW ABOUT: Any allergies you have. All medicines you are taking, including vitamins, herbs, eyedrops, and over-the-counter medicines and creams. Previous problems you or members of your family have had with the use of anesthetics. Any blood disorders you have had. Other health problems you have. RISKS AND COMPLICATIONS Generally, the procedure to implant an ICD is safe. However, as with any surgical procedure, complications can occur. Possible complications associated with implanting an ICD include: Swelling, bleeding, or bruising at the site where the ICD was implanted. Infection at the site where the ICD was implanted. A reaction to medicine used  during the procedure. Nerve, heart, or blood vessel damage. Blood clots. BEFORE THE PROCEDURE You may need to have blood tests, heart tests, or a chest X-ray done  before the day of the procedure. Ask your caregiver about changing or stopping your regular medicines. Make plans to have someone drive you home. You may need to stay in the hospital overnight after the procedure. Stop smoking at least 24 hours before the procedure. Take a bath or shower the night before the procedure. You may need to scrub your chest or abdomen with a special type of soap. Do not eat or drink before your procedure for as long as directed by your caregiver. Ask if it is okay to take any needed medicine with a small sip of water. PROCEDURE  The procedure to implant an ICD in your chest or abdomen is usually done at a hospital in a room that has a large X-ray machine called a fluoroscope. The machine will be above you during the procedure. It will help your caregiver see your heart during the procedure. Implanting an ICD usually takes 1-3 hours. Before the procedure:  Small monitors will be put on your body. They will be used to check your heart, blood pressure, and oxygen level. A needle will be put into a vein in your hand or arm. This is called an intravenous (IV) access tube. Fluids and medicine will flow directly into your body through the IV tube. Your chest or abdomen will be cleaned with a germ-killing (antiseptic) solution. The area may be shaved. You may be given medicine to help you relax (sedative). You will be given a medicine called a local anesthetic. This medicine will make the surgical site numb while the ICD is implanted. You will be sleepy but awake during the procedure. After you are numb the procedure will begin. The caregiver will: Make a small cut (incision). This will make a pocket deep under your skin that will hold the pulse generator. Guide the leads through a large blood vessel into your heart and attach them to the heart muscles. Depending on the ICD, the leads may go into one ventricle or they may go to both ventricles and into an upper chamber of the heart  (atrium). Test the ICD. Close the incision with stitches, glue, or staples. AFTER THE PROCEDURE You may feel pain. Some pain is normal. It may last a few days. You may stay in a recovery area until the local anesthetic has worn off. Your blood pressure and pulse will be checked often. You will be taken to a room where your heart will be monitored. A chest X-ray will be taken. This is done to check that the cardioverter defibrillator is in the right place. You may stay in the hospital overnight. A slight bump may be seen over the skin where the ICD was placed. Sometimes, it is possible to feel the ICD under the skin. This is normal. In the months and years afterward, your caregiver will check the device, the leads, and the battery every few months. Eventually, when the battery is low, the ICD will be replaced.   This information is not intended to replace advice given to you by your health care provider. Make sure you discuss any questions you have with your health care provider.   Document Released: 05/16/2002 Document Revised: 06/14/2013 Document Reviewed: 09/12/2012 Elsevier Interactive Patient Education 2016 [employer]    Cardioverter Defibrillator Implantation, Care After This sheet gives you information about how to care  for yourself after your procedure. Your health care provider may also give you more specific instructions. If you have problems or questions, contact your health care provider. What can I expect after the procedure? After the procedure, it is common to have: Some pain. It may last a few days. A slight bump over the skin where the device was placed. Sometimes, it is possible to feel the device under the skin. This is normal.  During the months and years after your procedure, your health care provider will check the device, the leads, and the battery every few months. Eventually, when the battery is low, the device will be replaced. Follow these instructions at  home: Medicines Take over-the-counter and prescription medicines only as told by your health care provider. If you were prescribed an antibiotic medicine, take it as told by your health care provider. Do not stop taking the antibiotic even if you start to feel better. Incision care  Follow instructions from your health care provider about how to take care of your incision area. Make sure you: Wash your hands with soap and water before you change your bandage (dressing). If soap and water are not available, use hand sanitizer. Change your dressing as told by your health care provider. Leave stitches (sutures), skin glue, or adhesive strips in place. These skin closures may need to stay in place for 2 weeks or longer. If adhesive strip edges start to loosen and curl up, you may trim the loose edges. Do not remove adhesive strips completely unless your health care provider tells you to do that. Check your incision area every day for signs of infection. Check for: More redness, swelling, or pain. More fluid or blood. Warmth. Pus or a bad smell. Do not use lotions or ointments near the incision area unless told by your health care provider. Keep the incision area clean and dry for 2-3 days after the procedure or for as long as told by your health care provider. It takes several weeks for the incision site to heal completely. Do not take baths, swim, or use a hot tub until your health care provider approves. Activity Try to walk a little every day. Exercising is important after this procedure. Also, use your shoulder on the side of the defibrillator in daily tasks that do not require a lot of motion. For at least 6 weeks: Do not lift your upper arm above your shoulders. This means no tennis, golf, or swimming for this period of time. If you tend to sleep with your arm above your head, use a restraint to prevent this during sleep. Avoid sudden jerking, pulling, or chopping movements that pull your  upper arm far away from your body. Ask your health care provider when you may go back to work. Check with your health care provider before you start to drive or play sports. Electric and magnetic fields Tell all health care providers that you have a defibrillator. This may prevent them from giving you an MRI scan because strong magnets are used for that test. If you must pass through a metal detector, quickly walk through it. Do not stop under the detector, and do not stand near it. Avoid places or objects that have a strong electric or magnetic field, including: Scientist, physiological. At the airport, let officials know that you have a defibrillator. Your defibrillator ID card will let you be checked in a way that is safe for you and will not damage your defibrillator. Also, do not let  a security person wave a magnetic wand near your defibrillator. That can make it stop working. Power plants. Large electrical generators. Anti-theft systems or electronic article surveillance (EAS). Radiofrequency transmission towers, such as cell phone and radio towers. Do not use amateur (ham) radio equipment or electric (arc) welding torches. Some devices are safe to use if held at least 12 inches (30 cm) from your defibrillator. These include power tools, lawn mowers, and speakers. If you are unsure if something is safe to use, ask your health care provider. Do not use MP3 player headphones. They have magnets. You may safely use electric blankets, heating pads, computers, and microwave ovens. When using your cell phone, hold it to the ear that is on the opposite side from the defibrillator. Do not leave your cell phone in a pocket over the defibrillator. General instructions Follow diet instructions from your health care provider, if this applies. Always keep your defibrillator ID card with you. The card should list the implant date, device model, and manufacturer. Consider wearing a medical alert bracelet or  necklace. Have your defibrillator checked every 3-6 months or as often as told by your health care provider. Most defibrillators last for 4-8 years. Keep all follow-up visits as told by your health care provider. This is important for your health care provider to make sure your chest is healing the way it should. Ask your health care provider when you should come back to have your stitches or staples taken out. Contact a health care provider if: You feel one shock in your chest. You gain weight suddenly. Your legs or feet swell more than they have before. It feels like your heart is fluttering or skipping beats (heart palpitations). You have more redness, swelling, or pain around your incision. You have more fluid or blood coming from your incision. Your incision feels warm to the touch. You have pus or a bad smell coming from your incision. You have a fever. Get help right away if: You have chest pain. You feel more than one shock. You feel more short of breath than you have felt before. You feel more light-headed than you have felt before. Your incision starts to open up. This information is not intended to replace advice given to you by your health care provider. Make sure you discuss any questions you have with your health care provider. Document Released: 03/13/2005 Document Revised: 03/13/2016 Document Reviewed: 01/29/2016 Elsevier Interactive Patient Education  2018 [employer].     Supplemental Discharge Instructions for  Pacemaker/Defibrillator Patients  ACTIVITY No heavy lifting or vigorous activity with your left/right arm for 6 to 8 weeks.  Do not raise your left/right arm above your head for one week.  Gradually raise your affected arm as drawn below.           __  NO DRIVING for     ; you may begin driving on     .  WOUND CARE Keep the wound area clean and dry.  Do not get this area wet for one week. No showers for one week; you may shower on     . The  tape/steri-strips on your wound will fall off; do not pull them off.  No bandage is needed on the site.  DO  NOT apply any creams, oils, or ointments to the wound area. If you notice any drainage or discharge from the wound, any swelling or bruising at the site, or you develop a fever > 101? F after you are  discharged home, call the office at once.  SPECIAL INSTRUCTIONS You are still able to use cellular telephones; use the ear opposite the side where you have your pacemaker/defibrillator.  Avoid carrying your cellular phone near your device. When traveling through airports, show security personnel your identification card to avoid being screened in the metal detectors.  Ask the security personnel to use the hand wand. Avoid arc welding equipment, MRI testing (magnetic resonance imaging), TENS units (transcutaneous nerve stimulators).  Call the office for questions about other devices. Avoid electrical appliances that are in poor condition or are not properly grounded. Microwave ovens are safe to be near or to operate.  ADDITIONAL INFORMATION FOR DEFIBRILLATOR PATIENTS SHOULD YOUR DEVICE GO OFF: If your device goes off ONCE and you feel fine afterward, notify the device clinic nurses. If your device goes off ONCE and you do not feel well afterward, call 911. If your device goes off TWICE, call 911. If your device goes off THREE TIMES IN ONE DAY, call 911.  DO NOT DRIVE YOURSELF OR A FAMILY MEMBER WITH A DEFIBRILLATOR TO THE HOSPITAL--CALL 911.

## 2023-03-22 NOTE — Progress Notes (Signed)
Electrophysiology Office Note:   Date:  03/22/2023  ID:  Jake [name], DOB 11/16/1960, MRN [id number]  Primary Cardiologist: Olga Millers, MD Electrophysiologist: Regan Lemming, MD      History of Present Illness:   Jake [name] is a 62 y.o. male with h/o systolic heart failure seen today for  for Electrophysiology evaluation of chronic systolic heart failure at the request of Olga Millers.    High resolution chest CT March 2022 showed interstitial lung disease and aortic atherosclerosis; enlarged pulmonary trunk suggestive of pulmonary hypertension.  Cardiac catheterization March 2022 showed normal left main, minimal irregularities in the LAD, 2 small diagonals, circumflex, 2 obtuse marginals, posterior lateral, PDA and nondominant right coronary artery; ejection fraction 30 to 35% with mild left ventricular enlargement.  Echocardiogram June 2023 showed ejection fraction 40 to 45%, grade 1 diastolic dysfunction, moderate left atrial enlargement, mild mitral regurgitation.  Patient admitted to [facility] March 2024 with STEMI.  Cardiac catheterization revealed 95 to 99% mid circumflex, 30% LAD, 50% PDA.  Patient had PCI of the left circumflex with drug-eluting stent.  Echocardiogram repeated February 23, 2023 and showed ejection fraction 25%, hypokinesis of the anterolateral and inferoseptal walls, severe left ventricular enlargement, mild left ventricular hypertrophy, grade 1 diastolic dysfunction, mild left atrial enlargement, mild mitral regurgitation and mildly dilated aortic root at 42 mm.    He has significant issues with chronic shortness of breath.  He attributes most of his shortness of breath to his lung disease.  He is on oxygen.  He is having much more difficulty now that it is hot and humid.  He feels well when the temperature is below 85 degrees.  He does have heart failure with a left bundle branch block.  Prior to his diagnosis of lung disease and cardiac disease, he  was quite active, able to exercise without issue now, he wears up to 4 L of oxygen at the time, doing minimal exercise.   Review of systems complete and found to be negative unless listed in HPI.   EP Information / Studies Reviewed:    EKG is ordered today. Personal review as below.  EKG Interpretation Date/Time:  Monday March 22 2023 14:56:30 EDT Ventricular Rate:  82 PR Interval:  162 QRS Duration:  176 QT Interval:  440 QTC Calculation: 514 R Axis:   4  Text Interpretation: Normal sinus rhythm Possible Left atrial enlargement Left bundle branch block When compared with ECG of 03-Sep-2021 19:02, No significant change since last tracing Confirmed by Jenie Parish ([id number]) on 03/22/2023 3:07:02 PM     Risk Assessment/Calculations:              Physical Exam:   VS:  BP 112/78   Pulse 82   Ht 5\' 11"  (1.803 m)   Wt 227 lb (103 kg)   SpO2 98%   BMI 31.66 kg/m    Wt Readings from Last 3 Encounters:  03/22/23 227 lb (103 kg)  03/03/23 223 lb (101.2 kg)  02/26/23 227 lb 9.6 oz (103.2 kg)     GEN: Well nourished, well developed in no acute distress NECK: No JVD; No carotid bruits CARDIAC: Regular rate and rhythm, no murmurs, rubs, gallops RESPIRATORY:  Clear to auscultation without rales, wheezing or rhonchi  ABDOMEN: Soft, non-tender, non-distended EXTREMITIES:  No edema; No deformity   ASSESSMENT AND PLAN:    1.  Chronic systolic heart failure: Due to ischemic cardiomyopathy.  Currently on optimal medical therapy per primary cardiology.  He  has a wide left bundle branch block.  He also has interstitial lung disease, though in discussion with his pulmonologist, ICD therapy would be reasonable.  With his left bundle branch block, he would benefit from CRT-D implant.  Risk and benefits of been discussed.  Risk include bleeding, tamponade, infection, pneumothorax, lead dislodgment, renal failure, MI, stroke, death.  He understands these risks and is agreed to the procedure.  2.   Coronary artery disease: No current chest pain.  Plan per primary cardiology.  3.  Hyperlipidemia: Plan per primary cardiology  4.  Hypertension: Currently well-controlled  5.  Obstructive sleep apnea: CPAP compliance encouraged  6.  Interstitial lung disease: Managed by pulmonary medicine  Case discussed with primary cardiology  Follow up with Dr. Elberta Fortis as usual post procedure  Signed, Arista Kettlewell Jorja Loa, MD

## 2023-03-22 NOTE — H&P (View-Only) (Signed)
Electrophysiology Office Note:   Date:  03/22/2023  ID:  Jake [name], DOB 11/16/1960, MRN [id number]  Primary Cardiologist: Olga Millers, MD Electrophysiologist: Regan Lemming, MD      History of Present Illness:   Jake [name] is a 62 y.o. male with h/o systolic heart failure seen today for  for Electrophysiology evaluation of chronic systolic heart failure at the request of Olga Millers.    High resolution chest CT March 2022 showed interstitial lung disease and aortic atherosclerosis; enlarged pulmonary trunk suggestive of pulmonary hypertension.  Cardiac catheterization March 2022 showed normal left main, minimal irregularities in the LAD, 2 small diagonals, circumflex, 2 obtuse marginals, posterior lateral, PDA and nondominant right coronary artery; ejection fraction 30 to 35% with mild left ventricular enlargement.  Echocardiogram June 2023 showed ejection fraction 40 to 45%, grade 1 diastolic dysfunction, moderate left atrial enlargement, mild mitral regurgitation.  Patient admitted to [facility] March 2024 with STEMI.  Cardiac catheterization revealed 95 to 99% mid circumflex, 30% LAD, 50% PDA.  Patient had PCI of the left circumflex with drug-eluting stent.  Echocardiogram repeated February 23, 2023 and showed ejection fraction 25%, hypokinesis of the anterolateral and inferoseptal walls, severe left ventricular enlargement, mild left ventricular hypertrophy, grade 1 diastolic dysfunction, mild left atrial enlargement, mild mitral regurgitation and mildly dilated aortic root at 42 mm.    He has significant issues with chronic shortness of breath.  He attributes most of his shortness of breath to his lung disease.  He is on oxygen.  He is having much more difficulty now that it is hot and humid.  He feels well when the temperature is below 85 degrees.  He does have heart failure with a left bundle branch block.  Prior to his diagnosis of lung disease and cardiac disease, he  was quite active, able to exercise without issue now, he wears up to 4 L of oxygen at the time, doing minimal exercise.   Review of systems complete and found to be negative unless listed in HPI.   EP Information / Studies Reviewed:    EKG is ordered today. Personal review as below.  EKG Interpretation Date/Time:  Monday March 22 2023 14:56:30 EDT Ventricular Rate:  82 PR Interval:  162 QRS Duration:  176 QT Interval:  440 QTC Calculation: 514 R Axis:   4  Text Interpretation: Normal sinus rhythm Possible Left atrial enlargement Left bundle branch block When compared with ECG of 03-Sep-2021 19:02, No significant change since last tracing Confirmed by ,  ([id number]) on 03/22/2023 3:07:02 PM     Risk Assessment/Calculations:              Physical Exam:   VS:  BP 112/78   Pulse 82   Ht 5\' 11"  (1.803 m)   Wt 227 lb (103 kg)   SpO2 98%   BMI 31.66 kg/m    Wt Readings from Last 3 Encounters:  03/22/23 227 lb (103 kg)  03/03/23 223 lb (101.2 kg)  02/26/23 227 lb 9.6 oz (103.2 kg)     GEN: Well nourished, well developed in no acute distress NECK: No JVD; No carotid bruits CARDIAC: Regular rate and rhythm, no murmurs, rubs, gallops RESPIRATORY:  Clear to auscultation without rales, wheezing or rhonchi  ABDOMEN: Soft, non-tender, non-distended EXTREMITIES:  No edema; No deformity   ASSESSMENT AND PLAN:    1.  Chronic systolic heart failure: Due to ischemic cardiomyopathy.  Currently on optimal medical therapy per primary cardiology.  He  has a wide left bundle branch block.  He also has interstitial lung disease, though in discussion with his pulmonologist, ICD therapy would be reasonable.  With his left bundle branch block, he would benefit from CRT-D implant.  Risk and benefits of been discussed.  Risk include bleeding, tamponade, infection, pneumothorax, lead dislodgment, renal failure, MI, stroke, death.  He understands these risks and is agreed to the procedure.  2.   Coronary artery disease: No current chest pain.  Plan per primary cardiology.  3.  Hyperlipidemia: Plan per primary cardiology  4.  Hypertension: Currently well-controlled  5.  Obstructive sleep apnea: CPAP compliance encouraged  6.  Interstitial lung disease: Managed by pulmonary medicine  Case discussed with primary cardiology  Follow up with Dr. Elberta Fortis as usual post procedure  Signed,  Jorja Loa, MD

## 2023-03-25 ENCOUNTER — Other Ambulatory Visit: Payer: Self-pay | Admitting: Physician Assistant

## 2023-03-25 DIAGNOSIS — K580 Irritable bowel syndrome with diarrhea: Secondary | ICD-10-CM

## 2023-03-26 ENCOUNTER — Ambulatory Visit: Payer: Medicare HMO | Admitting: Cardiology

## 2023-03-28 DIAGNOSIS — J849 Interstitial pulmonary disease, unspecified: Secondary | ICD-10-CM | POA: Diagnosis not present

## 2023-03-30 ENCOUNTER — Other Ambulatory Visit: Payer: Self-pay | Admitting: Psychiatry

## 2023-03-30 DIAGNOSIS — I5022 Chronic systolic (congestive) heart failure: Secondary | ICD-10-CM | POA: Diagnosis not present

## 2023-03-30 DIAGNOSIS — G4733 Obstructive sleep apnea (adult) (pediatric): Secondary | ICD-10-CM

## 2023-03-30 DIAGNOSIS — Z01812 Encounter for preprocedural laboratory examination: Secondary | ICD-10-CM | POA: Diagnosis not present

## 2023-03-31 LAB — BASIC METABOLIC PANEL
BUN/Creatinine Ratio: 20 (ref 10–24)
BUN: 26 mg/dL (ref 8–27)
CO2: 23 mmol/L (ref 20–29)
Calcium: 9.9 mg/dL (ref 8.6–10.2)
Chloride: 91 mmol/L — ABNORMAL LOW (ref 96–106)
Creatinine, Ser: 1.29 mg/dL — ABNORMAL HIGH (ref 0.76–1.27)
Glucose: 155 mg/dL — ABNORMAL HIGH (ref 70–99)
Potassium: 3.8 mmol/L (ref 3.5–5.2)
Sodium: 133 mmol/L — ABNORMAL LOW (ref 134–144)
eGFR: 63 mL/min/{1.73_m2} (ref >59)

## 2023-03-31 LAB — CBC
Hematocrit: 40.4 % (ref 37.5–51.0)
Hemoglobin: 13.1 g/dL (ref 13.0–17.7)
MCH: 26.4 pg — ABNORMAL LOW (ref 26.6–33.0)
MCHC: 32.4 g/dL (ref 31.5–35.7)
MCV: 81 fL (ref 79–97)
Platelets: 268 10*3/uL (ref 150–450)
RBC: 4.97 x10E6/uL (ref 4.14–5.80)
RDW: 14.4 % (ref 11.6–15.4)
WBC: 5.9 10*3/uL (ref 3.4–10.8)

## 2023-04-02 ENCOUNTER — Ambulatory Visit: Payer: Medicare HMO | Admitting: Psychiatry

## 2023-04-08 ENCOUNTER — Other Ambulatory Visit: Payer: Self-pay | Admitting: Psychiatry

## 2023-04-08 DIAGNOSIS — G4733 Obstructive sleep apnea (adult) (pediatric): Secondary | ICD-10-CM

## 2023-04-12 DIAGNOSIS — G4733 Obstructive sleep apnea (adult) (pediatric): Secondary | ICD-10-CM | POA: Diagnosis not present

## 2023-04-13 NOTE — Pre-Procedure Instructions (Signed)
Instructed patient on the following items: Arrival time 2:00 Nothing to eat or drink after midnight No meds AM of procedure Responsible person to drive you home and stay with you for 24 hrs Wash with special soap night before and morning of procedure  

## 2023-04-14 ENCOUNTER — Ambulatory Visit (HOSPITAL_COMMUNITY)
Admission: RE | Admit: 2023-04-14 | Discharge: 2023-04-15 | Disposition: A | Discharge status: Home / Self Care | Payer: Medicare HMO | Source: Ambulatory Visit | Attending: Cardiology | Admitting: Cardiology

## 2023-04-14 ENCOUNTER — Other Ambulatory Visit: Payer: Self-pay

## 2023-04-14 ENCOUNTER — Telehealth: Payer: Self-pay | Admitting: Pharmacist Clinician (PhC)/ Clinical Pharmacy Specialist

## 2023-04-14 ENCOUNTER — Ambulatory Visit (HOSPITAL_COMMUNITY)
Admission: RE | Disposition: A | Discharge status: Home / Self Care | Payer: Self-pay | Source: Ambulatory Visit | Attending: Cardiology

## 2023-04-14 DIAGNOSIS — F319 Bipolar disorder, unspecified: Secondary | ICD-10-CM | POA: Diagnosis not present

## 2023-04-14 DIAGNOSIS — I2510 Atherosclerotic heart disease of native coronary artery without angina pectoris: Secondary | ICD-10-CM | POA: Insufficient documentation

## 2023-04-14 DIAGNOSIS — I447 Left bundle-branch block, unspecified: Secondary | ICD-10-CM | POA: Diagnosis not present

## 2023-04-14 DIAGNOSIS — I509 Heart failure, unspecified: Secondary | ICD-10-CM

## 2023-04-14 DIAGNOSIS — Z955 Presence of coronary angioplasty implant and graft: Secondary | ICD-10-CM | POA: Diagnosis not present

## 2023-04-14 DIAGNOSIS — I252 Old myocardial infarction: Secondary | ICD-10-CM | POA: Diagnosis not present

## 2023-04-14 DIAGNOSIS — I5022 Chronic systolic (congestive) heart failure: Secondary | ICD-10-CM | POA: Insufficient documentation

## 2023-04-14 DIAGNOSIS — Z9981 Dependence on supplemental oxygen: Secondary | ICD-10-CM | POA: Diagnosis not present

## 2023-04-14 DIAGNOSIS — G4733 Obstructive sleep apnea (adult) (pediatric): Secondary | ICD-10-CM | POA: Insufficient documentation

## 2023-04-14 DIAGNOSIS — Z7902 Long term (current) use of antithrombotics/antiplatelets: Secondary | ICD-10-CM | POA: Insufficient documentation

## 2023-04-14 DIAGNOSIS — I420 Dilated cardiomyopathy: Secondary | ICD-10-CM

## 2023-04-14 DIAGNOSIS — E785 Hyperlipidemia, unspecified: Secondary | ICD-10-CM | POA: Insufficient documentation

## 2023-04-14 DIAGNOSIS — I255 Ischemic cardiomyopathy: Secondary | ICD-10-CM | POA: Insufficient documentation

## 2023-04-14 DIAGNOSIS — R0602 Shortness of breath: Secondary | ICD-10-CM

## 2023-04-14 DIAGNOSIS — J849 Interstitial pulmonary disease, unspecified: Secondary | ICD-10-CM | POA: Diagnosis not present

## 2023-04-14 DIAGNOSIS — I110 Hypertensive heart disease with heart failure: Secondary | ICD-10-CM | POA: Insufficient documentation

## 2023-04-14 DIAGNOSIS — E782 Mixed hyperlipidemia: Secondary | ICD-10-CM

## 2023-04-14 HISTORY — PX: BIV ICD INSERTION CRT-D: EP1195

## 2023-04-14 SURGERY — BIV ICD INSERTION CRT-D

## 2023-04-14 MED ORDER — MODAFINIL 100 MG PO TABS
200.0000 mg | ORAL_TABLET | Freq: Every day | ORAL | Status: DC
  Administered 2023-04-15: 200 mg via ORAL
  Filled 2023-04-14: qty 2

## 2023-04-14 MED ORDER — BUPIVACAINE HCL (PF) 0.25 % IJ SOLN
INTRAMUSCULAR | Status: AC
  Filled 2023-04-14: qty 30

## 2023-04-14 MED ORDER — SODIUM CHLORIDE 0.9 % IV SOLN
INTRAVENOUS | Status: AC
  Filled 2023-04-14: qty 2

## 2023-04-14 MED ORDER — CEFAZOLIN SODIUM-DEXTROSE 1-4 GM/50ML-% IV SOLN
1.0000 g | Freq: Four times a day (QID) | INTRAVENOUS | Status: AC
  Administered 2023-04-14 – 2023-04-15 (×3): 1 g via INTRAVENOUS
  Filled 2023-04-14 (×3): qty 50

## 2023-04-14 MED ORDER — LORATADINE 10 MG PO TABS
10.0000 mg | ORAL_TABLET | Freq: Every morning | ORAL | Status: DC
  Administered 2023-04-15: 10 mg via ORAL
  Filled 2023-04-14: qty 1

## 2023-04-14 MED ORDER — POTASSIUM CHLORIDE CRYS ER 10 MEQ PO TBCR
10.0000 meq | EXTENDED_RELEASE_TABLET | Freq: Two times a day (BID) | ORAL | Status: DC
  Administered 2023-04-14 – 2023-04-15 (×2): 10 meq via ORAL
  Filled 2023-04-14 (×2): qty 1

## 2023-04-14 MED ORDER — ACETAMINOPHEN 325 MG PO TABS
325.0000 mg | ORAL_TABLET | ORAL | Status: DC | PRN
  Administered 2023-04-14 – 2023-04-15 (×3): 650 mg via ORAL
  Filled 2023-04-14 (×3): qty 2

## 2023-04-14 MED ORDER — SERTRALINE HCL 100 MG PO TABS
200.0000 mg | ORAL_TABLET | Freq: Every day | ORAL | Status: DC
  Administered 2023-04-14: 200 mg via ORAL
  Filled 2023-04-14: qty 2

## 2023-04-14 MED ORDER — LIDOCAINE HCL 1 % IJ SOLN
INTRAMUSCULAR | Status: AC
  Filled 2023-04-14: qty 60

## 2023-04-14 MED ORDER — SPIRONOLACTONE 12.5 MG HALF TABLET
12.5000 mg | ORAL_TABLET | Freq: Every day | ORAL | Status: DC
  Administered 2023-04-15: 12.5 mg via ORAL
  Filled 2023-04-14: qty 1

## 2023-04-14 MED ORDER — DICYCLOMINE HCL 20 MG PO TABS
20.0000 mg | ORAL_TABLET | Freq: Three times a day (TID) | ORAL | Status: DC
  Administered 2023-04-14 – 2023-04-15 (×2): 20 mg via ORAL
  Filled 2023-04-14 (×4): qty 1

## 2023-04-14 MED ORDER — CEFAZOLIN SODIUM-DEXTROSE 2-4 GM/100ML-% IV SOLN
INTRAVENOUS | Status: AC
  Administered 2023-04-14: 2 g via INTRAVENOUS
  Filled 2023-04-14: qty 100

## 2023-04-14 MED ORDER — CLOPIDOGREL BISULFATE 75 MG PO TABS
75.0000 mg | ORAL_TABLET | Freq: Every day | ORAL | Status: DC
  Administered 2023-04-15: 75 mg via ORAL
  Filled 2023-04-14: qty 1

## 2023-04-14 MED ORDER — SODIUM CHLORIDE 0.9 % IV SOLN: 80.0000 mg | INTRAVENOUS | Status: DC

## 2023-04-14 MED ORDER — CEFAZOLIN SODIUM-DEXTROSE 2-4 GM/100ML-% IV SOLN: 2.0000 g | INTRAVENOUS | Status: AC

## 2023-04-14 MED ORDER — LIDOCAINE HCL (PF) 1 % IJ SOLN
INTRAMUSCULAR | Status: DC | PRN
  Administered 2023-04-14: 60 mL
  Administered 2023-04-14: 20 mL

## 2023-04-14 MED ORDER — PANTOPRAZOLE SODIUM 40 MG PO TBEC
40.0000 mg | DELAYED_RELEASE_TABLET | Freq: Two times a day (BID) | ORAL | Status: DC
  Administered 2023-04-14 – 2023-04-15 (×2): 40 mg via ORAL
  Filled 2023-04-14 (×2): qty 1

## 2023-04-14 MED ORDER — LIDOCAINE HCL 1 % IJ SOLN
INTRAMUSCULAR | Status: AC
  Filled 2023-04-14: qty 20

## 2023-04-14 MED ORDER — SODIUM CHLORIDE 0.9 % IV SOLN: INTRAVENOUS | Status: DC

## 2023-04-14 MED ORDER — COLESTIPOL HCL 1 G PO TABS
1.0000 g | ORAL_TABLET | Freq: Every day | ORAL | Status: DC
  Administered 2023-04-15: 1 g via ORAL
  Filled 2023-04-14: qty 1

## 2023-04-14 MED ORDER — CHLORHEXIDINE GLUCONATE 4 % EX SOLN
4.0000 | Freq: Once | CUTANEOUS | Status: DC
  Filled 2023-04-14: qty 60

## 2023-04-14 MED ORDER — SACUBITRIL-VALSARTAN 49-51 MG PO TABS
1.0000 | ORAL_TABLET | Freq: Two times a day (BID) | ORAL | Status: DC
  Administered 2023-04-14 – 2023-04-15 (×2): 1 via ORAL
  Filled 2023-04-14 (×2): qty 1

## 2023-04-14 MED ORDER — ALBUTEROL SULFATE (2.5 MG/3ML) 0.083% IN NEBU: 3.0000 mL | INHALATION_SOLUTION | Freq: Four times a day (QID) | RESPIRATORY_TRACT | Status: DC | PRN

## 2023-04-14 MED ORDER — HEPARIN (PORCINE) IN NACL 1000-0.9 UT/500ML-% IV SOLN
INTRAVENOUS | Status: DC | PRN
  Administered 2023-04-14: 500 mL

## 2023-04-14 MED ORDER — IOHEXOL 350 MG/ML SOLN
INTRAVENOUS | Status: DC | PRN
  Administered 2023-04-14: 7 mL

## 2023-04-14 MED ORDER — METOPROLOL SUCCINATE ER 25 MG PO TB24
12.5000 mg | ORAL_TABLET | Freq: Every day | ORAL | Status: DC
  Administered 2023-04-14: 12.5 mg via ORAL
  Filled 2023-04-14: qty 1

## 2023-04-14 MED ORDER — BUPIVACAINE HCL (PF) 0.25 % IJ SOLN
INTRAMUSCULAR | Status: DC | PRN
  Administered 2023-04-14: 50 mL

## 2023-04-14 MED ORDER — EZETIMIBE 10 MG PO TABS
10.0000 mg | ORAL_TABLET | Freq: Every day | ORAL | Status: DC
  Administered 2023-04-14: 10 mg via ORAL
  Filled 2023-04-14: qty 1

## 2023-04-14 MED ORDER — ONDANSETRON HCL 4 MG/2ML IJ SOLN: 4.0000 mg | Freq: Four times a day (QID) | INTRAMUSCULAR | Status: DC | PRN

## 2023-04-14 MED ORDER — ASPIRIN 81 MG PO TBEC
81.0000 mg | DELAYED_RELEASE_TABLET | Freq: Every day | ORAL | Status: DC
  Administered 2023-04-15: 81 mg via ORAL
  Filled 2023-04-14: qty 1

## 2023-04-14 MED ORDER — FINASTERIDE 5 MG PO TABS
5.0000 mg | ORAL_TABLET | Freq: Every day | ORAL | Status: DC
  Administered 2023-04-15: 5 mg via ORAL
  Filled 2023-04-14: qty 1

## 2023-04-14 MED ORDER — SODIUM CHLORIDE 0.9% FLUSH
3.0000 mL | Freq: Two times a day (BID) | INTRAVENOUS | Status: DC
  Administered 2023-04-14 – 2023-04-15 (×2): 3 mL via INTRAVENOUS

## 2023-04-14 MED ORDER — CHLORTHALIDONE 25 MG PO TABS
25.0000 mg | ORAL_TABLET | Freq: Every day | ORAL | Status: DC
  Administered 2023-04-15: 25 mg via ORAL
  Filled 2023-04-14: qty 1

## 2023-04-14 MED ORDER — ONDANSETRON HCL 4 MG PO TABS: 8.0000 mg | ORAL_TABLET | Freq: Three times a day (TID) | ORAL | Status: DC | PRN

## 2023-04-14 SURGICAL SUPPLY — 16 items
CABLE SURGICAL S-101-97-12 (CABLE) ×1 IMPLANT
CATH ATTAIN COM SURV 6250V-MB2 (CATHETERS) IMPLANT
ICD COBALT XT QUAD CRT DTPA2QQ (ICD Generator) IMPLANT
KIT ESSENTIALS PG (KITS) IMPLANT
LEAD ATTAIN PERFORMA S 4598-88 (Lead) IMPLANT
LEAD CAPSURE NOVUS 5076-52CM (Lead) IMPLANT
LEAD SPRINT QUAT SEC 6935M-62 (Lead) IMPLANT
PAD DEFIB RADIO PHYSIO CONN (PAD) ×1 IMPLANT
SHEATH 7FR PRELUDE SNAP 13 (SHEATH) IMPLANT
SHEATH 9.5FR PRELUDE SNAP 13 (SHEATH) IMPLANT
SHEATH 9FR PRELUDE SNAP 13 (SHEATH) IMPLANT
SHEATH PROBE COVER 6X72 (BAG) IMPLANT
SLITTER 6232ADJ (MISCELLANEOUS) IMPLANT
TRAY PACEMAKER INSERTION (PACKS) ×1 IMPLANT
WIRE ACUITY WHISPER EDS 4648 (WIRE) IMPLANT
WIRE HI TORQ VERSACORE-J 145CM (WIRE) IMPLANT

## 2023-04-14 NOTE — Interval H&P Note (Signed)
History and Physical Interval Note:  04/14/2023 2:04 PM  Jake [name]  has presented today for surgery, with the diagnosis of heart failure.  The various methods of treatment have been discussed with the patient and family. After consideration of risks, benefits and other options for treatment, the patient has consented to  Procedure(s): BIV ICD INSERTION CRT-D (N/A) as a surgical intervention.  The patient's history has been reviewed, patient examined, no change in status, stable for surgery.  I have reviewed the patient's chart and labs.  Questions were answered to the patient's satisfaction.      Jorja Loa  ICD Criteria  Current LVEF:25%. Within 12 months prior to implant: Yes   Heart failure history: Yes, Class II  Cardiomyopathy history: Yes, Non-Ischemic Cardiomyopathy.  Atrial Fibrillation/Atrial Flutter: No.  Ventricular tachycardia history: No.  Cardiac arrest history: No.  History of syndromes with risk of sudden death: No.  Previous ICD: No.  Current ICD indication: Primary  PPM indication: No.  Class I or II Bradycardia indication present: No  Beta Blocker therapy for 3 or more months: Yes, prescribed.   Ace Inhibitor/ARB therapy for 3 or more months: Yes, prescribed.    I have seen Jake [name] is a 62 y.o. malepre-procedural and has been referred by [facility] for consideration of ICD implant for primary prevention of sudden death.  The patient's chart has been reviewed and they meet criteria for ICD implant.  I have had a thorough discussion with the patient reviewing options.  The patient and their family (if available) have had opportunities to ask questions and have them answered. The patient and I have decided together through the [facility] Heart Care Share Decision Support Tool to implant ICD at this time.  Risks, benefits, alternatives to ICD implantation were discussed in detail with the patient today. The patient  understands that the risks  include but are not limited to bleeding, infection, pneumothorax, perforation, tamponade, vascular damage, renal failure, MI, stroke, death, inappropriate shocks, and lead dislodgement and  wishes to proceed.

## 2023-04-14 NOTE — Telephone Encounter (Signed)
-----   Message from Phillips Hay sent at 03/09/2023  4:15 PM EDT ----- Regarding: lipid labs Draw lipid labs in mid-August then determine if wants Leqvio or Nexlizet

## 2023-04-14 NOTE — Progress Notes (Signed)
Profend given pre-procedure. No order in [facility] to scan.

## 2023-04-14 NOTE — Plan of Care (Signed)
  Problem: Education: Goal: Knowledge of cardiac device and self-care will improve Outcome: Progressing Goal: Ability to safely manage health related needs after discharge will improve Outcome: Progressing Goal: Individualized Educational Video(s) Outcome: Progressing   Problem: Cardiac: Goal: Ability to achieve and maintain adequate cardiopulmonary perfusion will improve Outcome: Progressing

## 2023-04-14 NOTE — Discharge Instructions (Signed)
After Your ICD (Implantable Cardiac Defibrillator)   You have a Medtronic ICD  ACTIVITY Do not lift your arm above shoulder height for 1 week after your procedure. After 7 days, you may progress as below.  You should remove your sling 24 hours after your procedure, unless otherwise instructed by your provider.     Wednesday April 21, 2023  Thursday April 22, 2023 Friday April 23, 2023 Saturday April 24, 2023   Do not lift, push, pull, or carry anything over 10 pounds with the affected arm until 6 weeks (Wednesday May 26, 2023 ) after your procedure.   You may drive AFTER your wound check, unless you have been told otherwise by your provider.   Ask your healthcare provider when you can go back to work   INCISION/Dressing If you are on a blood thinner such as Coumadin, Xarelto, Eliquis, Plavix, or Pradaxa please confirm with your provider when this should be resumed.        Resume Clopidogrel (plavix) 04/15/23  If large square, outer bandage is left in place, this can be removed after 24 hours from your procedure. Do not remove steri-strips or glue as below.   Monitor your defibrillator site for redness, swelling, and drainage. Call the device clinic at [phone] if you experience these symptoms or fever/chills.  If your incision is sealed with Steri-strips or staples, you may shower 7 days after your procedure or when told by your provider. Do not remove the steri-strips or let the shower hit directly on your site. You may wash around your site with soap and water.    If you were discharged in a sling, please do not wear this during the day more than 48 hours after your surgery unless otherwise instructed. This may increase the risk of stiffness and soreness in your shoulder.   Avoid lotions, ointments, or perfumes over your incision until it is well-healed.  You may use a hot tub or a pool AFTER your wound check appointment if the incision is completely closed.  Your  ICD is designed to protect you from life threatening heart rhythms. Because of this, you may receive a shock.   1 shock with no symptoms:  Call the office during business hours. 1 shock with symptoms (chest pain, chest pressure, dizziness, lightheadedness, shortness of breath, overall feeling unwell):  Call 911. If you experience 2 or more shocks in 24 hours:  Call 911. If you receive a shock, you should not drive for 6 months per the [place] DMV IF you receive appropriate therapy from your ICD.   ICD Alerts:  Some alerts are vibratory and others beep. These are NOT emergencies. Please call our office to let us know. If this occurs at night or on weekends, it can wait until the next business day. Send a remote transmission.  If your device is capable of reading fluid status (for heart failure), you will be offered monthly monitoring to review this with you.   DEVICE MANAGEMENT Remote monitoring is used to monitor your ICD from home. This monitoring is scheduled every 91 days by our office. It allows [place] to keep an eye on the functioning of your device to ensure it is working properly. You will routinely see your Electrophysiologist annually (more often if necessary).   You should receive your ID card for your new device in 4-8 weeks. Keep this card with you at all times once received. Consider wearing a medical alert bracelet or necklace.  Your ICD  may  be MRI compatible. This will be discussed at your next office visit/wound check.  You should avoid contact with strong electric or magnetic fields.   Do not use amateur (ham) radio equipment or electric (arc) welding torches. MP3 player headphones with magnets should not be used. Some devices are safe to use if held at least 12 inches (30 cm) from your defibrillator. These include power tools, lawn mowers, and speakers. If you are unsure if something is safe to use, ask your health care provider.  When using your cell phone, hold it to the ear that is  on the opposite side from the defibrillator. Do not leave your cell phone in a pocket over the defibrillator.  You may safely use electric blankets, heating pads, computers, and microwave ovens.  Call the office right away if: You have chest pain. You feel more than one shock. You feel more short of breath than you have felt before. You feel more light-headed than you have felt before. Your incision starts to open up.  This information is not intended to replace advice given to you by your health care provider. Make sure you discuss any questions you have with your health care provider.

## 2023-04-15 ENCOUNTER — Encounter (HOSPITAL_COMMUNITY): Payer: Self-pay | Admitting: Cardiology

## 2023-04-15 ENCOUNTER — Ambulatory Visit (HOSPITAL_COMMUNITY): Payer: Medicare HMO

## 2023-04-15 DIAGNOSIS — I110 Hypertensive heart disease with heart failure: Secondary | ICD-10-CM | POA: Diagnosis not present

## 2023-04-15 DIAGNOSIS — Z9981 Dependence on supplemental oxygen: Secondary | ICD-10-CM | POA: Diagnosis not present

## 2023-04-15 DIAGNOSIS — J849 Interstitial pulmonary disease, unspecified: Secondary | ICD-10-CM | POA: Diagnosis not present

## 2023-04-15 DIAGNOSIS — I252 Old myocardial infarction: Secondary | ICD-10-CM | POA: Diagnosis not present

## 2023-04-15 DIAGNOSIS — I5022 Chronic systolic (congestive) heart failure: Secondary | ICD-10-CM | POA: Diagnosis not present

## 2023-04-15 DIAGNOSIS — I447 Left bundle-branch block, unspecified: Secondary | ICD-10-CM | POA: Diagnosis not present

## 2023-04-15 DIAGNOSIS — R9389 Abnormal findings on diagnostic imaging of other specified body structures: Secondary | ICD-10-CM | POA: Diagnosis not present

## 2023-04-15 DIAGNOSIS — I517 Cardiomegaly: Secondary | ICD-10-CM | POA: Diagnosis not present

## 2023-04-15 DIAGNOSIS — I2510 Atherosclerotic heart disease of native coronary artery without angina pectoris: Secondary | ICD-10-CM | POA: Diagnosis not present

## 2023-04-15 DIAGNOSIS — I255 Ischemic cardiomyopathy: Secondary | ICD-10-CM | POA: Diagnosis not present

## 2023-04-15 DIAGNOSIS — E785 Hyperlipidemia, unspecified: Secondary | ICD-10-CM | POA: Diagnosis not present

## 2023-04-15 DIAGNOSIS — G4733 Obstructive sleep apnea (adult) (pediatric): Secondary | ICD-10-CM | POA: Diagnosis not present

## 2023-04-15 DIAGNOSIS — Z955 Presence of coronary angioplasty implant and graft: Secondary | ICD-10-CM | POA: Diagnosis not present

## 2023-04-15 DIAGNOSIS — F319 Bipolar disorder, unspecified: Secondary | ICD-10-CM | POA: Diagnosis not present

## 2023-04-15 DIAGNOSIS — Z95810 Presence of automatic (implantable) cardiac defibrillator: Secondary | ICD-10-CM | POA: Diagnosis not present

## 2023-04-15 DIAGNOSIS — Z7902 Long term (current) use of antithrombotics/antiplatelets: Secondary | ICD-10-CM | POA: Diagnosis not present

## 2023-04-15 MED FILL — Gentamicin Sulfate Inj 40 MG/ML: INTRAMUSCULAR | Qty: 80 | Status: AC

## 2023-04-15 MED FILL — Lidocaine HCl Local Inj 1%: INTRAMUSCULAR | Qty: 4 | Status: AC

## 2023-04-15 NOTE — Discharge Summary (Addendum)
ELECTROPHYSIOLOGY PROCEDURE DISCHARGE SUMMARY    Patient ID: Jake [name],  MRN: [id number], DOB/AGE: Jan 23, 1961 62 y.o.  Admit date: 04/14/2023 Discharge date: 04/15/2023  Primary Care Physician: Nolene Ebbs  Primary Cardiologist: Dr. Jens Som Electrophysiologist: Dr. Elberta Fortis  Primary Discharge Diagnosis:  ICM  Secondary Discharge Diagnosis:  CAD S/p STEMI, PCI March 2024 HTN HLD Bipolar d/o OSA Interstitial lung disease Follows with pulmonary medicine team  Allergies  Allergen Reactions   Fentanyl Other (See Comments)    Avoid opiates Other reaction(s): Other Avoid opiates   Other     04/06/18 Patient is a recovering narcotic abuser and, per wife, is NEVER to receive controlled substances (e.g. Narcotics, sedatives, etc.) Other reaction(s): Unknown 04/06/18 Patient is a recovering narcotic abuser and, per wife, is NEVER to receive controlled substances (e.g. Narcotics, sedatives, etc.)   Aspirin Nausea And Vomiting   Lipitor [Atorvastatin] Nausea And Vomiting    GI side effects.     Prednisone Other (See Comments)     Agitation Severe anger outbursts   Ofev [Nintedanib]     GI side effects   Pirfenidone    Contrast Media [Iodinated Contrast Media] Palpitations    Patient states he tolerates now   Nitroglycerin Nausea And Vomiting    Can take with Nausea Meditcation     Procedures This Admission:  1.  Implantation of a MDT CRT-D on 04/14/23 by Dr Elberta Fortis.   DFT's were deferred at time of implant There were no immediate post procedure complications. 2.  CXR on 8824 demonstrated no pneumothorax status post device implantation.   Brief HPI: Jake [name] is a 62 y.o. male was referred to electrophysiology in the outpatient setting for consideration of ICD implantation.  Past medical history includes above.  The patient has persistent LV dysfunction despite guideline directed therapy.  Risks, benefits, and alternatives to ICD implantation  were reviewed with the patient who wished to proceed.   Hospital Course:  The patient was admitted and underwent implantation of a CRT-D with details as outlined above. He was monitored on telemetry overnight which demonstrated SR/V paced.  Left chest was without hematoma or ecchymosis.  The device was interrogated and found to be functioning normally.  CXR was obtained and demonstrated no pneumothorax status post device implantation.  Wound care, arm mobility, and restrictions were reviewed with the patient.  The patient feels well, denies any CP or SOB, minimal site discomfort, he was examined by Dr. Elberta Fortis and considered stable for discharge to home.   The patient's discharge medications include an ACE/ARB Sherryll Burger) and beta blocker (metoprolol  Resume Plavix Saturday 04/17/23).   Physical Exam: Vitals:   04/14/23 1925 04/14/23 2337 04/15/23 0416 04/15/23 0810  BP: 107/75 110/89 94/83 129/77  Pulse: 76 65 70 66  Resp: 18 17  20   Temp: 98.1 F (36.7 C) 98 F (36.7 C) 98.6 F (37 C)   TempSrc: Oral Oral Oral Oral  SpO2: 98% 99% 97% 98%  Weight:      Height:        GEN- The patient is well appearing, alert and oriented x 3 today.   HEENT: normocephalic, atraumatic; sclera clear, conjunctiva pink; hearing intact; oropharynx clear Lungs-  CTA b/l, normal work of breathing.  No wheezes, rales, rhonchi Heart- RRR, no murmurs, rubs or gallops, PMI not laterally displaced GI- soft, non-tender, non-distended Extremities- no clubbing, cyanosis, or edema MS- no significant deformity or atrophy Skin- warm and dry, no rash  or lesion, left chest without hematoma/ecchymosis Psych- euthymic mood, full affect Neuro- no gross defecits  Labs:   Lab Results  Component Value Date   WBC 5.9 03/30/2023   HGB 13.1 03/30/2023   HCT 40.4 03/30/2023   MCV 81 03/30/2023   PLT 268 03/30/2023   No results for input(s): "NA", "K", "CL", "CO2", "BUN", "CREATININE", "CALCIUM", "PROT", "BILITOT",  "ALKPHOS", "ALT", "AST", "GLUCOSE" in the last 168 hours.  Invalid input(s): "LABALBU"  Discharge Medications:  Allergies as of 04/15/2023       Reactions   Fentanyl Other (See Comments)   Avoid opiates Other reaction(s): Other Avoid opiates   Other    04/06/18 Patient is a recovering narcotic abuser and, per wife, is NEVER to receive controlled substances (e.g. Narcotics, sedatives, etc.) Other reaction(s): Unknown 04/06/18 Patient is a recovering narcotic abuser and, per wife, is NEVER to receive controlled substances (e.g. Narcotics, sedatives, etc.)   Aspirin Nausea And Vomiting   Lipitor [atorvastatin] Nausea And Vomiting   GI side effects.    Prednisone Other (See Comments)    Agitation Severe anger outbursts   Ofev [nintedanib]    GI side effects   Pirfenidone    Contrast Media [iodinated Contrast Media] Palpitations   Patient states he tolerates now   Nitroglycerin Nausea And Vomiting   Can take with Nausea Meditcation        Medication List     TAKE these medications    albuterol 108 (90 Base) MCG/ACT inhaler Commonly known as: VENTOLIN HFA INHALE 2 PUFFS BY MOUTH EVERY 6 HOURS AS NEEDED   AMBULATORY NON FORMULARY MEDICATION Continuous positive airway pressure (CPAP) machine set at autotitration of H2O pressure, with all supplemental supplies as needed.   aspirin EC 81 MG tablet Take 81 mg by mouth daily. Swallow whole.   chlorthalidone 25 MG tablet Commonly known as: HYGROTON TAKE 1 TABLET BY MOUTH EVERY DAY   clopidogrel 75 MG tablet Commonly known as: PLAVIX Take 1 tablet (75 mg total) by mouth daily. Notes to patient: Resume 04/17/23   colestipol 1 g tablet Commonly known as: COLESTID TAKE 1 TABLET BY MOUTH EVERY DAY   dicyclomine 20 MG tablet Commonly known as: BENTYL TAKE 1 TABLET BY MOUTH THREE TIMES A DAY   Entresto 49-51 MG Generic drug: sacubitril-valsartan Take 1 tablet by mouth 2 (two) times daily.   ezetimibe 10 MG tablet Commonly  known as: ZETIA Take 1 tablet (10 mg total) by mouth at bedtime.   finasteride 5 MG tablet Commonly known as: PROSCAR TAKE 1 TABLET (5 MG TOTAL) BY MOUTH DAILY.   ibuprofen 800 MG tablet Commonly known as: ADVIL TAKE 1 TABLET BY MOUTH EVERY 8 HOURS AS NEEDED   Klor-Con M10 10 MEQ tablet Generic drug: potassium chloride TAKE 1 TABLET BY MOUTH TWICE A DAY   loratadine 10 MG tablet Commonly known as: CLARITIN Take 10 mg by mouth in the morning.   metoprolol succinate 25 MG 24 hr tablet Commonly known as: Toprol XL Take 0.5 tablets (12.5 mg total) by mouth at bedtime.   modafinil 200 MG tablet Commonly known as: PROVIGIL TAKE 1 TABLET BY MOUTH EVERY DAY   ondansetron 8 MG tablet Commonly known as: ZOFRAN TAKE 1 TABLET BY MOUTH EVERY 8 HOURS AS NEEDED FOR NAUSEA FOR VOMITING   OXYGEN Inhale 3-4 L into the lungs continuous.   pantoprazole 40 MG tablet Commonly known as: PROTONIX TAKE 1 TABLET BY MOUTH TWICE A DAY   Pulse Oximeter Misc  Monitor oxygen three times per day.   sertraline 100 MG tablet Commonly known as: ZOLOFT Take 2 tablets (200 mg total) by mouth daily. What changed: when to take this   spironolactone 25 MG tablet Commonly known as: ALDACTONE Take 0.5 tablets (12.5 mg total) by mouth daily.   triamcinolone 55 MCG/ACT Aero nasal inhaler Commonly known as: NASACORT Place 2-3 sprays into the nose daily as needed (allergies (when pollen is very high)).   TYLENOL SINUS CONGESTION/PAIN PO Take 1 tablet by mouth daily as needed (Congestion).        Disposition: Home Discharge Instructions     Diet - low sodium heart healthy   Complete by: As directed    Increase activity slowly   Complete by: As directed         Duration of Discharge Encounter: Greater than 30 minutes including physician time.  Norma Fredrickson, PA-C 04/15/2023 10:28 AM     I have seen and examined this patient with Francis Dowse.  Agree with above, note added to  reflect my findings.  On exam, RRR, device site clean and dry.  She is now status post Medtronic CRT-D for chronic systolic heart failure with left bundle branch block.  Device functioning appropriately.  Chest x-ray and interrogation without issue.  Plan for discharge today with follow-up in device clinic.   M.  MD 04/15/2023 5:47 PM

## 2023-04-15 NOTE — Plan of Care (Signed)
POC progressing.  

## 2023-04-16 ENCOUNTER — Telehealth: Payer: Self-pay

## 2023-04-16 NOTE — Telephone Encounter (Signed)
Follow-up after same day discharge: Implant date: 04/15/2023 MD: Loman Brooklyn, MD Device: Medtronic [id number] Cobalt XT HF CRT-D MRI Location: Left chest   Wound check visit: 04/28/23 @ 10:40 AM 90 day MD follow-up: 07/19/23 @ 2:45 PM  Remote Transmission received:Yes  Dressing/sling removed: Yes  Confirm OAC restart on: Plavix 04/15/23

## 2023-04-16 NOTE — Telephone Encounter (Signed)
-----   Message from Sheilah Pigeon sent at 04/14/2023  3:54 PM EDT ----- Same day d/c CRT-D MDT WC  Resume Plavix 04/15/23  renee

## 2023-04-28 ENCOUNTER — Ambulatory Visit: Payer: Medicare HMO | Attending: Internal Medicine

## 2023-04-28 DIAGNOSIS — I5022 Chronic systolic (congestive) heart failure: Secondary | ICD-10-CM | POA: Diagnosis not present

## 2023-04-28 DIAGNOSIS — J849 Interstitial pulmonary disease, unspecified: Secondary | ICD-10-CM | POA: Diagnosis not present

## 2023-04-28 LAB — CUP PACEART INCLINIC DEVICE CHECK
Date Time Interrogation Session: 20240821121951
Implantable Lead Connection Status: 753985
Implantable Lead Connection Status: 753985
Implantable Lead Connection Status: 753985
Implantable Lead Implant Date: 20240807
Implantable Lead Implant Date: 20240807
Implantable Lead Implant Date: 20240807
Implantable Lead Location: 753858
Implantable Lead Location: 753859
Implantable Lead Location: 753860
Implantable Lead Model: 4598
Implantable Pulse Generator Implant Date: 20240807

## 2023-04-28 NOTE — Patient Instructions (Signed)

## 2023-04-28 NOTE — Progress Notes (Signed)

## 2023-04-30 ENCOUNTER — Other Ambulatory Visit: Payer: Self-pay | Admitting: Physician Assistant

## 2023-04-30 NOTE — Progress Notes (Unsigned)
HPI: Follow-up CAD.  CPX March 2022 showed submaximal effort, moderate functional impairment due to pulmonary restriction.  High resolution chest CT March 2022 showed interstitial lung disease and aortic atherosclerosis; enlarged pulmonary trunk suggestive of pulmonary hypertension.  Cardiac catheterization March 2022 showed normal left main, minimal irregularities in the LAD, 2 small diagonals, circumflex, 2 obtuse marginals, posterior lateral, PDA and nondominant right coronary artery; ejection fraction 30 to 35% with mild left ventricular enlargement.  Echocardiogram June 2023 showed ejection fraction 40 to 45%, grade 1 diastolic dysfunction, moderate left atrial enlargement, mild mitral regurgitation.  Right heart catheterization June 2023 showed cardiac output 6.59 L/min, cardiac index 2.96 L/min, RA pressure 11, PA pressure 25/12 and pulmonary capillary wedge pressure 8. CTA January 2024 showed pneumonitis and atheromatous changes of the thoracic aorta but no aneurysm.  Patient admitted to [facility] March 2024 with STEMI.  Cardiac catheterization revealed 95 to 99% mid circumflex, 30% LAD, 50% PDA.  Patient had PCI of the left circumflex with drug-eluting stent.  Echocardiogram repeated February 23, 2023 and showed ejection fraction 25%, hypokinesis of the anterolateral and inferoseptal walls, severe left ventricular enlargement, mild left ventricular hypertrophy, grade 1 diastolic dysfunction, mild left atrial enlargement, mild mitral regurgitation and mildly dilated aortic root at 42 mm.  High-resolution chest CT June 2024 showed interstitial lung disease but no aneurysm.  Had biventricular ICD placed August 2024.  Since last seen   Current Outpatient Medications  Medication Sig Dispense Refill   albuterol (VENTOLIN HFA) 108 (90 Base) MCG/ACT inhaler INHALE 2 PUFFS BY MOUTH EVERY 6 HOURS AS NEEDED 18 each 3   AMBULATORY NON FORMULARY MEDICATION Continuous positive airway pressure (CPAP)  machine set at autotitration of H2O pressure, with all supplemental supplies as needed. 1 each 0   aspirin EC 81 MG tablet Take 81 mg by mouth daily. Swallow whole.     chlorthalidone (HYGROTON) 25 MG tablet TAKE 1 TABLET BY MOUTH EVERY DAY 90 tablet 1   clopidogrel (PLAVIX) 75 MG tablet Take 1 tablet (75 mg total) by mouth daily. 90 tablet 3   colestipol (COLESTID) 1 g tablet TAKE 1 TABLET BY MOUTH EVERY DAY 30 tablet 0   dicyclomine (BENTYL) 20 MG tablet TAKE 1 TABLET BY MOUTH THREE TIMES A DAY 270 tablet 1   ENTRESTO 49-51 MG Take 1 tablet by mouth 2 (two) times daily. 60 tablet 3   ezetimibe (ZETIA) 10 MG tablet Take 1 tablet (10 mg total) by mouth at bedtime. 90 tablet 1   finasteride (PROSCAR) 5 MG tablet TAKE 1 TABLET (5 MG TOTAL) BY MOUTH DAILY. 90 tablet 2   ibuprofen (ADVIL) 800 MG tablet TAKE 1 TABLET BY MOUTH EVERY 8 HOURS AS NEEDED 90 tablet 5   KLOR-CON M10 10 MEQ tablet TAKE 1 TABLET BY MOUTH TWICE A DAY 180 tablet 1   loratadine (CLARITIN) 10 MG tablet Take 10 mg by mouth in the morning.     metoprolol succinate (TOPROL XL) 25 MG 24 hr tablet Take 0.5 tablets (12.5 mg total) by mouth at bedtime. 45 tablet 3   Misc. Devices (PULSE OXIMETER) MISC Monitor oxygen three times per day. 1 each 0   modafinil (PROVIGIL) 200 MG tablet TAKE 1 TABLET BY MOUTH EVERY DAY 30 tablet 1   ondansetron (ZOFRAN) 8 MG tablet TAKE 1 TABLET BY MOUTH EVERY 8 HOURS AS NEEDED FOR NAUSEA FOR VOMITING 90 tablet 1   OXYGEN Inhale 3-4 L into the lungs continuous.  pantoprazole (PROTONIX) 40 MG tablet TAKE 1 TABLET BY MOUTH TWICE A DAY 180 tablet 1   Phenylephrine-Acetaminophen (TYLENOL SINUS CONGESTION/PAIN PO) Take 1 tablet by mouth daily as needed (Congestion).     sertraline (ZOLOFT) 100 MG tablet Take 2 tablets (200 mg total) by mouth daily. (Patient taking differently: Take 200 mg by mouth at bedtime.) 180 tablet 1   spironolactone (ALDACTONE) 25 MG tablet Take 0.5 tablets (12.5 mg total) by mouth  daily. 45 tablet 3   triamcinolone (NASACORT) 55 MCG/ACT AERO nasal inhaler Place 2-3 sprays into the nose daily as needed (allergies (when pollen is very high)).     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    Past Surgical History:  Procedure Laterality Date   ANKLE FRACTURE SURGERY Left    APPENDECTOMY     BIOPSY  11/18/2021   Procedure: BIOPSY;  Surgeon: Shellia Cleverly, DO;  Location: [facility];  Service: Gastroenterology;;  EGD and COLON   BIV ICD INSERTION CRT-D N/A 04/14/2023   Procedure: BIV ICD INSERTION CRT-D;  Surgeon: Regan Lemming, MD;  Location: [facility] INVASIVE CV LAB;  Service: Cardiovascular;  Laterality: N/A;   CERVICAL FUSION     CHOLECYSTECTOMY     COLON SURGERY     COLONOSCOPY WITH PROPOFOL N/A 11/18/2021   Procedure: COLONOSCOPY WITH PROPOFOL;  Surgeon: Shellia Cleverly, DO;  Location: [facility];  Service: Gastroenterology;  Laterality: N/A;   ESOPHAGOGASTRODUODENOSCOPY (EGD) WITH PROPOFOL N/A 11/18/2021   Procedure: ESOPHAGOGASTRODUODENOSCOPY (EGD) WITH PROPOFOL;  Surgeon: Shellia Cleverly, DO;  Location: [facility];  Service: Gastroenterology;  Laterality: N/A;   HERNIA REPAIR     POLYPECTOMY  11/18/2021   Procedure: POLYPECTOMY;  Surgeon: Shellia Cleverly, DO;  Location: [facility];  Service: Gastroenterology;;   RIGHT HEART CATH N/A 02/13/2022   Procedure: RIGHT HEART CATH;  Surgeon: Kathleene Hazel, MD;  Location: [facility] INVASIVE CV LAB;  Service: Cardiovascular;  Laterality: N/A;    Social History   Socioeconomic History   Marital  status: Married    Spouse name: Dawn   Number of children: 1   Years of education: 14   Highest education level: Some college, no degree  Occupational History   Occupation: Disabled  Tobacco Use   Smoking status: Former    Current packs/day: 0.00    Average packs/day: 1 pack/day for 28.0 years (28.0 ttl pk-yrs)    Types: Cigarettes    Start date: 3    Quit date: 2010    Years since quitting: 14.6   Smokeless tobacco: Never  Vaping Use   Vaping status: Never Used  Substance and Sexual Activity   Alcohol use: No   Drug use: No    Comment: 04/06/2018 Patient's wife wanted [place] to be very aware that her husband is a recovering narcotic abuser   Sexual activity: Yes  Other Topics Concern   Not on file  Social History Narrative   Lives with his wife, daughter, son-in-law and 2 two grandchildren. Likes to spend to  time with grandchildren. Does not exercise due to back issues in the past and respiratory problems.   Social Determinants of Health   Financial Resource Strain: Low Risk  (02/04/2022)   Overall Financial Resource Strain (CARDIA)    Difficulty of Paying Living Expenses: Not hard at all  Food Insecurity: Low Risk  (11/16/2022)   [facility]   Food vital sign    Within the past 12 months, you worried that your food would run out before you got money to buy more: Never true    Within the past 12 months, the food you bought just didn't last and you didn't have money to get more. : Never true  Transportation Needs: Unmet Transportation Needs (11/16/2022)   [facility]   Transportation    In the past 12 months, has lack of reliable transportation kept you from medical appointments, meetings, work or from getting things needed for daily living? : Yes  Physical Activity: Inactive (02/04/2022)   Exercise Vital Sign    Days of Exercise per Week: 0 days    Minutes of Exercise per Session: 0 min  Stress: No Stress Concern  Present (02/04/2022)   [employer] of Occupational Health - Occupational Stress Questionnaire    Feeling of Stress : Not at all  Social Connections: Moderately Integrated (02/04/2022)   Social Connection and Isolation Panel [NHANES]    Frequency of Communication with Friends and Family: Once a week    Frequency of Social Gatherings with Friends and Family: More than three times a week    Attends Religious Services: More than 4 times per year    Active Member of [employer] or Organizations: No    Attends Banker Meetings: Never    Marital Status: Married  Catering manager Violence: Not At Risk (02/04/2022)   Humiliation, Afraid, Rape, and Kick questionnaire    Fear of Current or Ex-Partner: No    Emotionally Abused: No    Physically Abused: No    Sexually Abused: No    Family History  Problem Relation Age of Onset   Cancer Mother        breast   Depression Mother    Hyperlipidemia Mother    Dementia Mother    Alcohol abuse Father    Cancer Father        skin   Hyperlipidemia Father    Alzheimer's disease Father    Cancer Maternal Grandfather    Cancer Paternal Grandmother    Pancreatic cancer Paternal Uncle    Colon cancer Neg Hx    Esophageal cancer Neg Hx    Stomach cancer Neg Hx     ROS: no fevers or chills, productive cough, hemoptysis, dysphasia, odynophagia, melena, hematochezia, dysuria, hematuria, rash, seizure activity, orthopnea, PND, pedal edema, claudication. Remaining systems are negative.  Physical Exam: Well-developed well-nourished in no acute distress.  Skin is warm and dry.  HEENT is normal.  Neck is supple.  Chest is clear to auscultation with normal expansion.  Cardiovascular exam is regular rate and rhythm.  Abdominal exam nontender or distended. No masses palpated. Extremities show no edema. neuro grossly intact  ECG- personally reviewed  A/P  1 coronary artery disease-continue Plavix.  He is intolerant to statins.  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  He declined SGLT2 inhibitor.  Euvolemic on examination today.  3 combined ischemic/nonischemic cardiomyopathy-continue Entresto and Toprol.  4 status post biventricular ICD-follow-up electrophysiology.  5 hyperlipidemia-continue  Zetia.  Intolerant to statins and also did not tolerate Repatha.  6 hypertension-blood pressure controlled.  Continue present medications.  7 obstructive sleep apnea-continue CPAP.  8 history of dilated thoracic aorta-this was not noted on CTA January 2024.  9 interstitial lung disease-per pulmonary.  Olga Millers, MD

## 2023-05-04 ENCOUNTER — Other Ambulatory Visit: Payer: Self-pay | Admitting: Physician Assistant

## 2023-05-04 DIAGNOSIS — R110 Nausea: Secondary | ICD-10-CM

## 2023-05-05 ENCOUNTER — Encounter: Payer: Self-pay | Admitting: Cardiology

## 2023-05-05 ENCOUNTER — Telehealth: Payer: Self-pay

## 2023-05-05 ENCOUNTER — Ambulatory Visit: Payer: Medicare HMO | Admitting: Cardiology

## 2023-05-05 VITALS — BP 131/73 | HR 100 | Ht 71.0 in | Wt 226.0 lb

## 2023-05-05 DIAGNOSIS — I420 Dilated cardiomyopathy: Secondary | ICD-10-CM | POA: Diagnosis not present

## 2023-05-05 DIAGNOSIS — E785 Hyperlipidemia, unspecified: Secondary | ICD-10-CM

## 2023-05-05 DIAGNOSIS — R9431 Abnormal electrocardiogram [ECG] [EKG]: Secondary | ICD-10-CM

## 2023-05-05 DIAGNOSIS — I5022 Chronic systolic (congestive) heart failure: Secondary | ICD-10-CM | POA: Diagnosis not present

## 2023-05-05 DIAGNOSIS — I2510 Atherosclerotic heart disease of native coronary artery without angina pectoris: Secondary | ICD-10-CM | POA: Diagnosis not present

## 2023-05-05 DIAGNOSIS — J849 Interstitial pulmonary disease, unspecified: Secondary | ICD-10-CM | POA: Diagnosis not present

## 2023-05-05 NOTE — Telephone Encounter (Signed)
Pt called in stating that his ICD made a noise in his chest Saturday and then again today. He states he is fine his chest is tight but thinks it has to do with his breathing not his heart.

## 2023-05-05 NOTE — Telephone Encounter (Signed)
Transmission reviewed, no alerts normal device function. Patient has apt today with Dr. Jens Som

## 2023-05-05 NOTE — Patient Instructions (Signed)
Medication Instructions:   Your physician recommends that you continue on your current medications as directed. Please refer to the Current Medication list given to you today.   *If you need a refill on your cardiac medications before your next appointment, please call your pharmacy*   Lab Work:  None ordered.  If you have labs (blood work) drawn today and your tests are completely normal, you will receive your results only by: MyChart Message (if you have MyChart) OR A paper copy in the mail If you have any lab test that is abnormal or we need to change your treatment, we will call you to review the results.   Testing/Procedures:  None ordered.   Follow-Up: At [facility], you and your health needs are our priority.  As part of our continuing mission to provide you with exceptional heart care, we have created designated Provider Care Teams.  These Care Teams include your primary Cardiologist (physician) and Advanced Practice Providers (APPs -  Physician Assistants and Nurse Practitioners) who all work together to provide you with the care you need, when you need it.  We recommend signing up for the patient portal called "MyChart".  Sign up information is provided on this After Visit Summary.  MyChart is used to connect with patients for Virtual Visits (Telemedicine).  Patients are able to view lab/test results, encounter notes, upcoming appointments, etc.  Non-urgent messages can be sent to your provider as well.   To learn more about what you can do with MyChart, go to [email or web address].    Your next appointment:   5 month(s)  Provider:   Olga Millers, MD

## 2023-05-06 ENCOUNTER — Other Ambulatory Visit: Payer: Self-pay | Admitting: Psychiatry

## 2023-05-06 ENCOUNTER — Other Ambulatory Visit (HOSPITAL_COMMUNITY): Payer: Self-pay | Admitting: Radiology

## 2023-05-06 DIAGNOSIS — F411 Generalized anxiety disorder: Secondary | ICD-10-CM

## 2023-05-06 DIAGNOSIS — J849 Interstitial pulmonary disease, unspecified: Secondary | ICD-10-CM

## 2023-05-06 DIAGNOSIS — F39 Unspecified mood [affective] disorder: Secondary | ICD-10-CM

## 2023-05-07 ENCOUNTER — Telehealth: Payer: Self-pay

## 2023-05-07 DIAGNOSIS — F411 Generalized anxiety disorder: Secondary | ICD-10-CM

## 2023-05-07 MED ORDER — BUSPIRONE HCL 15 MG PO TABS
ORAL_TABLET | ORAL | 0 refills | Status: DC
Start: 2023-05-07 — End: 2023-05-20

## 2023-05-07 NOTE — Telephone Encounter (Signed)
Agree that it sounds like he is referring to Buspar. Please let him know that a script has been sent with instructions.

## 2023-05-07 NOTE — Telephone Encounter (Signed)
Patient informed. 

## 2023-05-11 ENCOUNTER — Ambulatory Visit (HOSPITAL_COMMUNITY)
Admission: RE | Admit: 2023-05-11 | Discharge: 2023-05-11 | Disposition: A | Discharge status: Home / Self Care | Payer: Medicare HMO | Source: Ambulatory Visit | Attending: Internal Medicine | Admitting: Internal Medicine

## 2023-05-11 DIAGNOSIS — J849 Interstitial pulmonary disease, unspecified: Secondary | ICD-10-CM | POA: Diagnosis not present

## 2023-05-11 LAB — PULMONARY FUNCTION TEST
DL/VA % pred: 67 %
DL/VA: 2.81 ml/min/mmHg/L
DLCO unc % pred: 29 %
DLCO unc: 8.4 ml/min/mmHg
FEF 25-75 Pre: 4.01 L/s
FEF2575-%Pred-Pre: 135 %
FEV1-%Pred-Pre: 47 %
FEV1-Pre: 1.75 L
FEV1FVC-%Pred-Pre: 120 %
FEV6-%Pred-Pre: 41 %
FEV6-Pre: 1.93 L
FEV6FVC-%Pred-Pre: 104 %
FVC-%Pred-Pre: 39 %
FVC-Pre: 1.93 L
Pre FEV1/FVC ratio: 91 %
Pre FEV6/FVC Ratio: 100 %
RV % pred: 272 %
RV: 6.38 L
TLC % pred: 115 %
TLC: 8.29 L

## 2023-05-13 ENCOUNTER — Encounter: Payer: Medicare HMO | Admitting: Internal Medicine

## 2023-05-13 ENCOUNTER — Telehealth: Payer: Self-pay | Admitting: Internal Medicine

## 2023-05-13 ENCOUNTER — Ambulatory Visit: Payer: Medicare HMO | Admitting: Internal Medicine

## 2023-05-13 VITALS — BP 118/70 | HR 70 | Wt 226.0 lb

## 2023-05-13 DIAGNOSIS — J849 Interstitial pulmonary disease, unspecified: Secondary | ICD-10-CM | POA: Diagnosis not present

## 2023-05-13 DIAGNOSIS — Z006 Encounter for examination for normal comparison and control in clinical research program: Secondary | ICD-10-CM

## 2023-05-13 DIAGNOSIS — J9611 Chronic respiratory failure with hypoxia: Secondary | ICD-10-CM | POA: Diagnosis not present

## 2023-05-13 NOTE — Progress Notes (Signed)
OV 02/03/2022 -transfer of care to Dr. Marchelle Gearing at the ILD clinic by Dr. Val Eagle  Subjective:  Patient ID: Jake [name], male , DOB: [birth year] , age 62 y.o. , MRN: [id number] , ADDRESS: [address] [place] [address] PCP Jomarie Longs, PA-C Patient Care Team: Nolene Ebbs as PCP - General (Family Medicine) Thomasene Ripple, DO as PCP - Cardiology (Cardiology) Fran Lowes, PA-C as Referring Physician (Pulmonary Disease) Gabriel Carina, [facility] as Pharmacist (Pharmacist)  This Provider for this visit: Treatment Team:  Attending Provider: Kalman Shan, MD    02/03/2022 -   Chief Complaint  Patient presents with   Follow-up    Pt switching from Dr. Wynona Neat to MR for ILD eval. Pt states lately he has had problems of SOB that can happen at any time and also has a chronic cough.     HPI Jake [name] 62 y.o. -history is provided by the wife, patient and also reviewed the medical records and the ILD questionnaire.  He has had insidious onset of shortness of breath for the last few years.  Chart review it appears that in 2020 when he got his respiratory infection [in retrospect he thinks this was COVID before the formal declaration of the pandemic] and since then he has been having progressive shortness of breath.  Especially over the last year or so his progression is worse..  For the last 1 year has been on oxygen 2 L.  He feels he haseven progressed more in the last few months.  And is now needing oxygen 4 L at rest at all the time.  Sometimes it is 3 L.  Wife says he easily desaturates into the 56s and occasionally into the 16s when he exerts.  When he does the bed or showers or does chores increases to 4 L.  He feels a portable oxygen is not helping him anymore.  Particularly more progressive in the last few months.  Recently was tried on prednisone.  He is not sure if it helped but it made him very angry had irate emotional outburst and he could not take  prednisone anymore.  Through all this late last year in 2022 he was off his Entresto for heart failure.  This made the shortness of breath worse but then he went back on it in January and he got better but nevertheless overall progressive dyspnea.     [facility] Comprehensive ILD Questionnaire  Symptoms:     Past Medical History :  Status post CABG in 2004.  He also had cardiac stents approximately 6-12 months ago according to his history with 1 repair of the stent.  Still it did not help the shortness of breath.  He has had COVID at least 3 times.  The first 1 was in January 2020 right before the onset of the pandemic.  The second 1 was in January 2020 and the third was in December 2022 during small bowel obstruction admission.  This was an incidental finding according to the wife  He has chronic systolic heart failure  Other issues include nausea, irritable bowel syndrome, acid reflux back and neck pain  He has obstructive sleep apnea  He has a history of pneumonia several years ago  ROS:  -Positive for fatigue for the last several months, arthralgia for the last several years, dysphagia for the last several decades, dry eyes for the last several months, nausea for the last several years, acid reflux for  the last several years also snoring for the last several years  FAMILY HISTORY of LUNG DISEASE:  Negative for any lung disease  PERSONAL EXPOSURE HISTORY:  -Did smoke in the remote past and quit -Smoked between 1985 and 2008 1 and half packs per day. -Possible cocaine use between 1991 and marijuana use between 1978 and 2008 ": Very little to none   HOME  EXPOSURE and HOBBY DETAILS :  -20 years ago he lived in a house that had black mold he lived there for few years but has not been exposed to that for 20 years.  -Was using a Phillips responding CPAP machine for a few years.  It was subject to recall because of foam silicone  contamination issues that caused  respiratory complications.  He stopped using this in 2021 shortly after the recall was issued.  -He is now living in his daughter's house for the last 3 months.  -The old house did have mold.  It also had mildew in the shower curtain. 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  Restrictive pattern with mild desaturations.  -Echocardiogram March 2022 ` -Ejection fraction 30-35%, grade 1 diastolic dysfunction  - BNP May 2023  - 31  - RHC June 2023  -  Nelva Bush RHC   Echo 02/12/22: EF 40-45%, mild MR. LA moderately dilated. RV size and function normal. GRade 1 DD. RHC 02/12/22: RA- 5 mmHg; PA 25/12 mmHg, mean PAP 17 mmHg, Wedge 8 mmHg, CO (fick) 6.59 L/mim, CI-2.96 L/min meter2.   03/12/2022: Today - follow up Patient presents today with wife after being started on Ofev. He is having significant nausea with his first dose of Ofev. He wakes up in the morning, takes his zofran and eats a small snack. He then takes his Ofev at 8 am with food as well, and has tried multiple different food options. He then becomes extremely nauseated around 845 and has to go lay down until he is able to take his second dose of Zofran around 1/2 pm. He finally begins to feel better and is able to tolerate his second dose. He is understandably  frustrated by this and does not wish to have this poor quality of life from his medications. He denies any vomiting or diarrhea. He has had some weight gain as he has had to eat so often with the Ofev to try to curve his nausea.   From a respiratory standpoint, he continues to get winded with minimal exertion. He can be changing the bed at home or walk 50 ft to the mailbox and has to sit down afterwards to rest and catch his breath. He has also had drops in his oxygen into the 70's-80's despite his supplemental 4 lpm so he has been minimally active because of this. No significant cough or wheezing. He has not noticed any difference in  his activity tolerance or lung function since starting the Ofev, but has felt so terrible that he's not sure if he would notice much.           OV 05/05/2022  Subjective:  Patient ID: Jake [name], male , DOB: 03-09-1961 , age 32 y.o. , MRN: [id number] , ADDRESS: [address] [place] [address] PCP Jomarie Longs, PA-C Patient Care Team: Nolene Ebbs as PCP - General (Family Medicine) Thomasene Ripple, DO as PCP - Cardiology (Cardiology) Fran Lowes, PA-C as Referring Physician (Pulmonary Disease) Gabriel Carina, [facility] as Pharmacist (Pharmacist)  This Provider for this visit: Treatment Team:  Attending Provider: Kalman Shan, MD    05/05/2022 -   Chief Complaint  Patient presents with   Follow-up    Pt states he feels like his breathing has become worse since last visit. Also states he has had a dry cough, chest discomfort, and has been having GI issues due to the Esbriet.     HPI Gurkirat Crofts 62 y.o. --presents for follow-up.  Presents with his wife.  He has non-- IPF progressive phenotype ILD.  Suspected etiology usage of CPAP machine Philips Respironics.  He tried nintedanib but he had significant intolerance.  He is currently on fourth week of pirfenidone at full dose for the last 1 week.  He is having significant  diarrhea nausea and fatigue because of this.  At 2 pills 3 times daily it was more tolerable.  He is willing to go back to 2 pills 3 times daily and try.  He also significant anxiety and depression.  He is on Zoloft.  In the past he has tried SNRI or some other agent according to the wife but he had side effects.  I have recommended they talk with the primary care physician about this.  He is also trying to lose weight.  He has been prescribed Wegovy but there is no stock available.  He has been looked at [facility] for lung transplant but given his low EF he has been disqualified and they do not want to see him.  He did have a right heart catheterization in June 2023 and this was normal.  According to him after some delay from our office he has been referred to Drinda Butts at [facility] for lung transplant evaluation.  But he has not heard back from them.  I personally emailed Dr. Lendon Collar office.  Also he tells me that with 4 L nasal cannula at rest he is fine but when he does any laundry he desaturates into the 80s.  He has tried increasing to 5 L nasal cannula.  I advised that he could increase it further and keep his pulse ox over 86% and monitor it.  He is willing to do that.  They are not interested in immunomodulators right now because of young kids in the house.    Overall he and his wife are worried that he is declining.  He did have pulmonary function test today and?  Slight decline compared to May 2023.[name] Kitchen  He had liver function test monitoring blood work yesterday but so far the results are not back yet.   SUMMARY VISIT Dr Harrold Donath 06/09/22 at [facility]   He also agrees that patient does not have IPF.  CT scan is not consistent with UIP.  Currently pirfenidone is causing too many side effects.  He wants to stop it.  Because there is more suggestion of valvulitis he wants  to start CellCept.  He is going to start the patient on 500 mg twice daily today.  Patient seeing me in approximately a  month from now.  At the time I will increase to a total of 1 g twice daily.   He also wants the patient referred to pulmonary rehabilitation.   In terms of lung transplantation a lot of relative contraindications including weight and mild cardiomyopathy and intolerance to steroids.  At this point in time he wants patient to be more active and get into rehab  Of note patient only desaturated to 88% on room air and that walk test today   Plan  - Get patient in for CBC chemistry and liver function test in 2-3 weeks from today  -Refer to pulmonary rehabilitation   OV 07/20/2022  Subjective:  Patient ID: Jake [name], male , DOB: [birth year] , age 80 y.o. , MRN: [id number] , ADDRESS: [address] [place] [address] PCP Jomarie Longs, PA-C Patient Care Team: Nolene Ebbs as PCP - General (Family Medicine) Thomasene Ripple, DO as PCP - Cardiology (Cardiology) Fran Lowes, PA-C as Referring Physician (Pulmonary Disease) Gabriel Carina, [facility] as Pharmacist (Pharmacist)  This Provider for this visit: Treatment Team:  Attending Provider: Kalman Shan, MD    07/20/2022 -   Chief Complaint  Patient presents with   Follow-up    Pt states he has been doing okay since last visit. Has not worn his O2 in about a month and states that he has been walking about 0.38mile a day.     HPI Montero Monce 62 y.o. -returns for follow-up with his wife.  Since his last visit he has lost over 10 pounds because of Ozempic.  He started his Ozempic.  Then approximately a month ago he did see Dr. Marcie Bal at [facility].  At the time his exercise hypoxemia improved.  He started beginning to feel better as well.  According to the wife and him a day prior to that he was still feeling miserable but on the day of the visit he started noticing a turnaround and since then he has had sustained turnaround.  He was having side effects from pirfenidone and this was stopped  early October 2023.  Dr. Harrold Donath recommended mycophenolate mofetil [patient mistakenly thought this was metoprolol at this visit but later confirmed that he has been on metoprolol for a long time to Dr. Jens Som for his heart failure].  Since then he is having sustained improvement in dyspnea.  Also he was able to go to [employer] and walk on flat ground for a long time without desaturating.  He still desaturates but it is only for heavy exertion such as vacuuming or lifting heavy load.  He will go into the low 80s and then rest to recover.  He does not use his oxygen for his hypoxemia.  Instead he opted to rest.  At this point in time he is telling me because of his improvement he does not want to do his mycophenolate Reather Littler is going to confirm it was indeed mycophenolate and not metoprolol].  I referred him to pulm rehabilitation but this was expensive.  He is opting to exercise by himself and continue weight loss with Ozempic.  Also because of his improvement he is asking if he can do a pulmonary function test.  His walking desaturation test is improved.  He also his pulmonary function test shows improvement although he still worse compared to June  of last year.  His last echocardiogram was in the summer 2023 and he still had low ejection fraction.  His BNP at that time was normal.  In terms of lung transplant Dr. Harrold Donath is told him it is too high risk given his medication side effects and heart failure and obesity.  At this point in time he is reconciled to the fact that he will not be a transplant candidate.  However he is overall pleased that he is improved.         OV 10/29/2022  Subjective:  Patient ID: Jake [name], male , DOB: 05/18/1961 , age 43 y.o. , MRN: [id number] , ADDRESS: [address] [place] [address] PCP Jomarie Longs, PA-C Patient Care Team: Nolene Ebbs as PCP - General (Family Medicine) Thomasene Ripple, DO as PCP - Cardiology  (Cardiology) Fran Lowes, PA-C as Referring Physician (Pulmonary Disease) Gabriel Carina, [facility] as Pharmacist (Pharmacist)  This Provider for this visit: Treatment Team:  Attending Provider: Kalman Shan, MD    10/29/2022 -   Chief Complaint  Patient presents with   Follow-up    Cleda Daub review,     HPI Jake [name] 62 y.o. - Returns for follow-up with his wife.  Overall he reports being stable.  Weight is stable.  He is no longer on antifibrotic's because of intolerance.  His ejection fraction has dropped to 30% and he is frustrated by that.  Wife states that he does desaturate when he exerts and then he rests and the oxygen goes up.  She would prefer for him to wear the oxygen preemptively.  I encouraged him to do so because that is a better strategy.  At this point in time he states that he had a rough flu season he had stomach flu twice.  He believes he might have "once in 2 or 3 other respiratory infections again denies any mold or mildew or bird feathers in the house he does do housework but has to wear oxygen for that he can go shopping without oxygen but wife suspects he might be desaturating.  We did discuss clinical trials as a care option.  Presented to have inhaled pirfenidone trial that is yet to start and also inhaled treprostinil trial that is ongoing.  They are interested in this.  Discussed the following care concept about clinical trials.  Will reassess for this and second quarter or third quarter 2024.  He did have a CT scan of the chest Jan 2024 I reviewed the result.  ILD is described but there is no other abnormality described.  Since CT scan with contrast so we cannot compare the CT scans.     OV 01/05/2023  Subjective:  Patient ID: Jake [name], male , DOB: February 23, 1961 , age 57 y.o. , MRN: [id number] , ADDRESS: [address] [place] [place] [address] PCP Jomarie Longs, PA-C Patient Care Team: Nolene Ebbs as PCP - General (Family  Medicine) Jens Som Madolyn Frieze, MD as PCP - Cardiology (Cardiology) Fran Lowes, PA-C as Referring Physician (Pulmonary Disease) Gabriel Carina, [facility] as Pharmacist (Pharmacist)  This Provider for this visit: Treatment Team:  Attending Provider: Kalman Shan, MD    01/05/2023 -   Chief Complaint  Patient presents with   Follow-up    F/up after heart attack    HPI Dart Raulerson 62 y.o. - presents with wife. On approx 11/16/22 felt unweasy with chest burn. Admitted at [facility] and dx with MI with trop  leak. Had DES. Dsichargd on Brilintia and metoprolol but both gav him side efffects of dyspnea, anxiety, arthralgia. So both stopped and now on plavix and repatha. Toleraitng it ok but for mild fatigues. He came in today to updated me. Resp wise he feels stable. Can hold pulse ox > 88% at rest but with exertun desaturates. Wife is heer with him and is indeoendenbt historian - says he is not promot about wearing his o2. Currenty dealing with allergies. Used wifes; Azihro Wondered if I could fill his azithro but his baseline 2024 and 2023 EKG bith show QTc > 500 msec       OV 02/16/2023  Subjective:  Patient ID: Jake [name], male , DOB: [birth year] , age 39 y.o. , MRN: [id number] , ADDRESS: [address] [place] [address] PCP Jomarie Longs, PA-C Patient Care Team: Nolene Ebbs as PCP - General (Family Medicine) Jens Som Madolyn Frieze, MD as PCP - Cardiology (Cardiology) Fran Lowes, PA-C as Referring Physician (Pulmonary Disease) Gabriel Carina, [facility] as Pharmacist (Pharmacist)  This Provider for this visit: Treatment Team:  Attending Provider: Kalman Shan, MD  02/16/2023 -   Chief Complaint  Patient presents with   Acute Visit    Cough, chest tightness, increasing sob when not on oxygen.     HPI Yunis Elg 62 y.o. - ACUTE VISIT3L.  Presents with his wife Jezreel Oh.  She is an independent historian.  She corroborates everything that he is saying.   Around February 10, 2023 Wednesday started having slightly more cough.  Then on February 12, 2023 Friday he went to an outdoor concert in [place].  They started coughing more.  Then after he came back he started feeling and this is since February 13, 2023 tired with chest tightness coughing.  Also for the last 1 week his oxygen is dropping.  Previously could take a shower without desaturating now he drops into the 70s.  He says his pulse ox at rest is in the 80s.  Currently pulse ox is 98% on 3 L at rest.  There is no edema but there is orthopnea.  There is no hemoptysis.  There is no wheezing no fever no diarrhea.  Has baseline nausea and it has not changed.  He feels his chest is extremely tight.  There might be some wheezing 2.  The cough is dry but 1 time he did have 1 nickel sized mucus but that is just the 1 time in many days.  He is really worried about his condition.  He does not feel it similar to his previous heart attack.  His wife is wondering about getting a CT chest last CT chest was a year ago.   He does not want steroids.  Especially oral.  He says he is fine with IM steroids.      OV 05/13/2023  Subjective:  Patient ID: Charlestine Night, male , DOB: [birth year] , age 33 y.o. , MRN: [id number] , ADDRESS: 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 but also exposure to a  [employer] Resp[employer] devic Pattern does NOT fit In with IPF 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  Echo June 2024 with EF 25%  Prolonged QTc    05/13/2023 -   Chief Complaint  Patient presents with   Follow-up    O2 2-3 l       HPI DEANDRA ABEGGLEN 62 y.o. -presents for follow-up.  Presents with his wife who is an independent historian.  He tells me that the IM Depo-Medrol I gave him helped him.  He cannot do oral prednisone because it changes his mood.  He states his mood gets so bad that even I would not like to know him.  He feels that after initial improvement from the acute flareup he started feeling worse again.  He feels he is chronically getting worse on 04/14/2023 he had ICD and pacemaker implantation but despite that he is getting worse.  Symptom score shows significant decline.  Concomitant with that high-resolution CT chest that I reviewed visualized and showed it to him and his wife shows worsening ILD.  Pulmonary function test also shows a decline.  Although the sit/stand exercise hypoxemia test appears to be the same.  He reports it is getting very difficult for him to go to the bathroom.  We had conversations about his life expectancy which could still be few to several years with a lot of variability.  We discussed treatment options.  He cannot tolerate the antifibrotic's pirfenidone or nintedanib.  He does not want to do oral prednisone.  We discussed CellCept but he is worried about immunosuppression.  He lives with his grandkids and he adores them.  He says they come with respiratory infections therefore he does not want to  do this at this point he wants to do IM Depo-Medrol on a scheduled basis.  I have reached out to our pharmacy team he is also willing to try IV Solu-Medrol.  We discussed clinical trials as a care option.  He is already in the ILD-Pro registry.  He will have a research visit today.  There is inhaled pirfenidone and treprostinil studies.  We will prescreen this   SYMPTOM SCALE - ILD 02/03/2022 07/20/2022 Off esbreit and ofev 10/29/22 Ef now 30-35% 05/13/2023 Ef 25$  Current weight 231# 224# - ozempic 229#   O2 use 4L [place] rest  Ra rest   Shortness of Breath 0 -> 5 scale with 5 being worst (score 6 If unable to do)     At rest 2 0 2 2  Simple tasks - showers, clothes change, eating, shaving 5 2 3 4   Household (dishes, doing bed, laundry) 5 4 5 5   Shopping 5 2 1 4   Walking level at own pace 4 3 2 4   Walking up Stairs 5 4 4 5   Total (30-36) Dyspnea Score 26 15 17 24       Non-dyspnea symptoms (0-> 5 scale) 02/03/2022 07/20/2022  10/29/2022  05/13/2023   How bad is your cough? 4 3 1 4   How bad is your fatigue 5  2 3 5   How bad is nausea 4 5 4 4   How bad is vomiting?  0 0 0 0  How bad is diarrhea? 0 1 0 0  How bad is anxiety? 5 1 2 5   How bad is depression 5 1 2 2   Any chronic pain - if so where and how bad 3 x       Simple office walk 185 feet x  3 laps goal with forehead probe 07/20/2022  01/05/2023  02/16/2023  05/13/2023   O2 used ra ra 3L ra  Number laps completed 3 5 sit stand  5 sit stand  Comments about pace avg     Resting Pulse Ox/HR 99% and 85/min 94% 98% at rest 97% and HR 95  Final Pulse Ox/HR 93% and 100/min 89%  88% befiefly a minute later  Desaturated </= 88% no     Desaturated <= 3% points Yes 6      Got Tachycardic >/= 90/min yes     Symptoms at end of test Mild dyspea     Miscellaneous comments improved       CT Chest data from date: HRCT 02/25/23  - personally visualized and independently interpreted : yes - my findings are: as below Narrative & Impression   CLINICAL DATA:  62 year old male with history of prior myocardial infarction. Evaluate for interstitial lung disease.   EXAM: CT CHEST WITHOUT CONTRAST   TECHNIQUE: Multidetector CT imaging of the chest was performed following the standard protocol without intravenous contrast. High resolution imaging of the lungs, as well as inspiratory and expiratory imaging, was performed.   RADIATION DOSE REDUCTION: This exam was performed according to the departmental dose-optimization program which includes automated exposure control, adjustment of the mA and/or kV according to patient size and/or use of iterative reconstruction technique.   COMPARISON:  Multiple priors, including prior high-resolution chest CT 01/16/2022.   FINDINGS: Cardiovascular: Heart size is normal. There is no significant pericardial fluid, thickening or pericardial calcification. There is aortic atherosclerosis, as well as atherosclerosis of the great vessels of the mediastinum and the coronary arteries, including calcified atherosclerotic plaque in the left anterior descending and right coronary arteries. Calcifications of the aortic valve.   Mediastinum/Nodes: No pathologically enlarged mediastinal or hilar lymph nodes. Please note that accurate exclusion of hilar adenopathy is limited on noncontrast CT scans. Esophagus is unremarkable in appearance. No axillary lymphadenopathy.   Lungs/Pleura: High-resolution images again demonstrate patchy areas of ground-glass attenuation, septal thickening, subpleural reticulation, mild cylindrical bronchiectasis and peripheral bronchiolectasis scattered throughout the lungs bilaterally. There is no discernible craniocaudal gradient to these findings. No frank honeycombing. Findings appear minimally progressive compared to the prior study from 01/16/2022. Inspiratory and expiratory imaging demonstrates mild air trapping indicative of small airways disease. No acute  consolidative airspace disease. No pleural effusions. No definite suspicious appearing pulmonary nodules or masses are noted.   Upper Abdomen: Atherosclerotic calcifications in the abdominal aorta. Status post cholecystectomy.   Musculoskeletal: Orthopedic fixation hardware in the lower cervical spine incidentally noted. There are no aggressive appearing lytic or blastic lesions noted in the visualized portions of the skeleton.   IMPRESSION: 1. The appearance of the lungs remains compatible with interstitial lung disease, with a spectrum of findings considered most compatible with an alternative diagnosis (not usual interstitial pneumonia) per current ATS guidelines. 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 2. Aortic atherosclerosis, in addition to two-vessel coronary artery  disease. Please note that although the presence of coronary artery calcium documents the presence of coronary artery disease, the severity of this disease and any potential stenosis cannot be assessed on this non-gated CT examination. Assessment for potential risk factor modification, dietary therapy or pharmacologic therapy may be warranted, if clinically indicated. 3. There are calcifications of the aortic valve. Echocardiographic correlation for evaluation of potential valvular dysfunction may be warranted if clinically indicated.   Aortic Atherosclerosis (ICD10-I70.0).     Electronically Signed   By: Trudie Reed M.D.   On: 03/01/2023 12:31    PFT     Latest Ref Rng & Units 05/11/2023    2:56 PM 10/29/2022   12:49 PM 07/02/2022   11:35 AM 05/04/2022    3:32 PM 01/15/2022    3:58 PM 02/26/2021   12:00 PM  ILD indicators  FVC-Pre L 1.93   2.20  1.87  1.91  2.44   FVC-Predicted Pre % 39  41  44  38  38  49   FVC-Post L     1.97  2.35   FVC-Predicted Post %     40  47   TLC L 8.29     4.36  4.52   TLC  Predicted % 115     60  62   DLCO uncorrected ml/min/mmHg 8.40  12.55  12.27  11.35  14.05  16.66   DLCO UNC %Pred % 29  44  43  40  49  58   DLCO Corrected ml/min/mmHg   12.27  11.93  14.05  16.66   DLCO COR %Pred %   43  42  49  58       LAB RESULTS last 96 hours No results found.  LAB RESULTS last 90 days Recent Results (from the past 2160 hour(s))  Troponin T, High Sensitivity (hs-TnT)     Status: None   Collection Time: 02/16/23  3:08 PM  Result Value Ref Range   Troponin T (Highly Sensitive) 20 <23 ng/L    Comment: Results checked and verified.  Relative Risk: Optimal <6 ng/L; Moderate: Males 6-22 ng/L, Females 6-14 ng/L; High: Males: >22 ng/L, Females: >14 ng/L. Reference Range: Males <23 ng/L, Females <15 ng/L. High Sensitivity Troponin T (hs-TnT) levels exceeding the gender-specific 99th percentile upper reference limit (males >22 ng/L, females >14 ng/L) may indicate a recent acute myocardial infarction however, hs-TnT results should always be assessed in conjunction with the patients medical history, clinical examination, symptoms of cardiac ischemia, electrocardiogram results, and/or other cardiovascular disease (CVD) diagnostic findings. Elevations in hs-TnT can also be observed in other heart conditions. To distinguish between acute and chronic hs-TnT elevations, serial sampling and clinical correlation is recommended for interpretation. There is literature supporting any hs-TnT >=6 ng/L confers increased CVD relative risk (Oluleye OW, et al. Dewayne Hatch Epidemiol. 2013;23(2):66- 73; Seliger SL, et al. Circulation. 2017;135(16):1494-1505). For additional information, please refer to [employer] (this link is being provided for informational/educational purposes only).    D-Dimer, Quantitative     Status: None   Collection Time: 02/16/23  3:08 PM  Result Value Ref Range   D-Dimer, Quant 0.27 <0.50 mcg/mL FEU    Comment: . The D-Dimer  test is used frequently to exclude an acute PE or DVT. In patients with a low to moderate clinical risk assessment and a D-Dimer result <0.50 mcg/mL FEU, the likelihood of a PE or DVT is very low. However, a thromboembolic event should not be excluded solely on the basis  of the D-Dimer level. Increased levels of D-Dimer are associated with a PE, DVT, DIC, malignancies, inflammation, sepsis, surgery, trauma, pregnancy, and advancing patient age. [Jama 2006 11:295(2):199-207] . For additional information, please refer to: http://education.questdiagnostics.com/faq/FAQ149 (This link is being provided for informational/ educational purposes only) .   Lipid panel     Status: None   Collection Time: 02/16/23  3:08 PM  Result Value Ref Range   Cholesterol 91 0 - 200 mg/dL    Comment: ATP III Classification       Desirable:  < 200 mg/dL               Borderline High:  200 - 239 mg/dL          High:  > = 161 mg/dL   Triglycerides 096.0 0.0 - 149.0 mg/dL    Comment: Normal:  <454 mg/dLBorderline High:  150 - 199 mg/dL   HDL 09.81 >19.14 mg/dL   VLDL 78.2 0.0 - 95.6 mg/dL   LDL Cholesterol 22 0 - 99 mg/dL   Total CHOL/HDL Ratio 2     Comment:                Men          Women1/2 Average Risk     3.4          3.3Average Risk          5.0          4.42X Average Risk          9.6          7.13X Average Risk          15.0          11.0                       NonHDL 48.04     Comment: NOTE:  Non-HDL goal should be 30 mg/dL higher than patient's LDL goal (i.e. LDL goal of < 70 mg/dL, would have non-HDL goal of < 100 mg/dL)  B Nat Peptide     Status: Abnormal   Collection Time: 02/16/23  3:08 PM  Result Value Ref Range   Pro B Natriuretic peptide (BNP) 125.0 (H) 0.0 - 100.0 pg/mL  Hepatic function panel     Status: None   Collection Time: 02/16/23  3:08 PM  Result Value Ref Range   Total Bilirubin 0.3 0.2 - 1.2 mg/dL   Bilirubin, Direct 0.1 0.0 - 0.3 mg/dL   Alkaline Phosphatase 60 39 - 117 U/L    AST 31 0 - 37 U/L   ALT 23 0 - 53 U/L   Total Protein 7.5 6.0 - 8.3 g/dL   Albumin 4.6 3.5 - 5.2 g/dL  Basic Metabolic Panel (BMET)     Status: Abnormal   Collection Time: 02/16/23  3:08 PM  Result Value Ref Range   Sodium 130 (L) 135 - 145 mEq/L   Potassium 3.6 3.5 - 5.1 mEq/L   Chloride 88 (L) 96 - 112 mEq/L   CO2 35 (H) 19 - 32 mEq/L   Glucose, Bld 92 70 - 99 mg/dL   BUN 19 6 - 23 mg/dL   Creatinine, Ser 2.13 0.40 - 1.50 mg/dL   GFR 08.65 >78.46 mL/min    Comment: Calculated using the CKD-EPI Creatinine Equation (2021)   Calcium 9.6 8.4 - 10.5 mg/dL  CBC w/Diff     Status: Abnormal   Collection Time: 02/16/23  3:08 PM  Result Value Ref Range   WBC 6.4 4.0 - 10.5 K/uL   RBC 4.81 4.22 - 5.81 Mil/uL   Hemoglobin 12.8 (L) 13.0 - 17.0 g/dL   HCT 86.5 78.4 - 69.6 %   MCV 81.2 78.0 - 100.0 fl   MCHC 32.8 30.0 - 36.0 g/dL   RDW 29.5 28.4 - 13.2 %   Platelets 286.0 150.0 - 400.0 K/uL   Neutrophils Relative % 68.9 43.0 - 77.0 %   Lymphocytes Relative 14.7 12.0 - 46.0 %   Monocytes Relative 11.7 3.0 - 12.0 %   Eosinophils Relative 4.1 0.0 - 5.0 %   Basophils Relative 0.6 0.0 - 3.0 %   Neutro Abs 4.4 1.4 - 7.7 K/uL   Lymphs Abs 0.9 0.7 - 4.0 K/uL   Monocytes Absolute 0.8 0.1 - 1.0 K/uL   Eosinophils Absolute 0.3 0.0 - 0.7 K/uL   Basophils Absolute 0.0 0.0 - 0.1 K/uL  ECHOCARDIOGRAM COMPLETE     Status: None   Collection Time: 02/23/23 11:45 AM  Result Value Ref Range   S' Lateral 5.65 cm   AV Area VTI 3.39 cm2   AV Mean grad 2.0 mmHg   Single Plane A4C EF 37.1 %   Single Plane A2C EF 23.9 %   Calc EF 32.9 %   AV Area mean vel 3.10 cm2   Area-P 1/2 4.96 cm2   AR max vel 3.62 cm2   AV Peak grad 2.9 mmHg   Ao pk vel 0.85 m/s   MV M vel 2.66 m/s   MV Peak grad 28.2 mmHg   Est EF 25   Basic Metabolic Panel (BMET)     Status: Abnormal   Collection Time: 03/12/23 11:04 AM  Result Value Ref Range   Glucose 92 70 - 99 mg/dL   BUN 22 8 - 27 mg/dL   Creatinine, Ser 4.40  0.76 - 1.27 mg/dL   eGFR 70 >10 UV/OZD/6.64   BUN/Creatinine Ratio 19 10 - 24   Sodium 137 134 - 144 mmol/L   Potassium 4.2 3.5 - 5.2 mmol/L   Chloride 92 (L) 96 - 106 mmol/L   CO2 28 20 - 29 mmol/L   Calcium 10.0 8.6 - 10.2 mg/dL  CBC     Status: Abnormal   Collection Time: 03/30/23  1:40 PM  Result Value Ref Range   WBC 5.9 3.4 - 10.8 x10E3/uL   RBC 4.97 4.14 - 5.80 x10E6/uL   Hemoglobin 13.1 13.0 - 17.7 g/dL   Hematocrit 40.3 47.4 - 51.0 %   MCV 81 79 - 97 fL   MCH 26.4 (L) 26.6 - 33.0 pg   MCHC 32.4 31.5 - 35.7 g/dL   RDW 25.9 56.3 - 87.5 %   Platelets 268 150 - 450 x10E3/uL  Basic metabolic panel     Status: Abnormal   Collection Time: 03/30/23  1:40 PM  Result Value Ref Range   Glucose 155 (H) 70 - 99 mg/dL   BUN 26 8 - 27 mg/dL   Creatinine, Ser 6.43 (H) 0.76 - 1.27 mg/dL   eGFR 63 >32 RJ/JOA/4.16   BUN/Creatinine Ratio 20 10 - 24   Sodium 133 (L) 134 - 144 mmol/L   Potassium 3.8 3.5 - 5.2 mmol/L   Chloride 91 (L) 96 - 106 mmol/L   CO2 23 20 - 29 mmol/L   Calcium 9.9 8.6 - 10.2 mg/dL  CUP PACEART INCLINIC DEVICE CHECK     Status: None   Collection Time: 04/28/23 12:19  PM  Result Value Ref Range   Pulse Generator Manufacturer MERM    Date Time Interrogation Session [phone]    Pulse Gen Model [id number] Cobalt XT HF CRT-D MRI    Pulse Gen Serial Number [id number]    Clinic Name [facility]    Implantable Pulse Generator Type Cardiac Resynch Therapy Defibulator    Implantable Pulse Generator Implant Date [id number]    Implantable Lead Manufacturer [facility]    Implantable Lead Model 4598 Attain Performa S    Implantable Lead Serial Number [id number]    Implantable Lead Implant Date 53664403    Implantable Lead Location Detail 1 UNKNOWN    Implantable Lead Location [id number]    Implantable Lead Connection Status [id number]    Implantable Lead Manufacturer [facility]    Implantable Lead Model [id number] Sprint Quattro Secure S    Implantable Lead Serial Number [id number]     Implantable Lead Implant Date 95638756    Implantable Lead Location Detail 1 UNKNOWN    Implantable Lead Location [id number]    Implantable Lead Connection Status [id number]    Implantable Lead Manufacturer [facility]    Implantable Lead Model [id number] Sprint Quattro Secure S    Implantable Lead Serial Number [id number]    Implantable Lead Implant Date [id number]    Implantable Lead Location Detail 1 UNKNOWN    Implantable Lead Location [id number]    Implantable Lead Connection Status [id number]    Eval Rhythm AS/VS 77   Pulmonary function test     Status: None   Collection Time: 05/11/23  2:56 PM  Result Value Ref Range   FVC-Pre 1.93 L   FVC-%Pred-Pre 39 %   FEV1-Pre 1.75 L   FEV1-%Pred-Pre 47 %   FEV6-Pre 1.93 L   FEV6-%Pred-Pre 41 %   Pre FEV1/FVC ratio 91 %   FEV1FVC-%Pred-Pre 120 %   Pre FEV6/FVC Ratio 100 %   FEV6FVC-%Pred-Pre 104 %   FEF 25-75 Pre 4.01 L/sec   FEF2575-%Pred-Pre 135 %   RV 6.38 L   RV % pred 272 %   TLC 8.29 L   TLC % pred 115 %   DLCO unc 8.40 ml/min/mmHg   DLCO unc % pred 29 %   DL/VA 0.63 ml/min/mmHg/L   DL/VA % pred 67 %         has a past medical history of Arthritis, Bipolar disorder (HCC), GERD (gastroesophageal reflux disease), Hyperlipidemia, Hypertension, and 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   reports that he quit smoking about 14 years ago. His smoking use included cigarettes. He started smoking about 42 years ago. He has a 28 pack-year smoking history. He has never used smokeless tobacco.  Past Surgical History:  Procedure Laterality Date   ANKLE FRACTURE SURGERY Left    APPENDECTOMY     BIOPSY  11/18/2021   Procedure: BIOPSY;  Surgeon: Shellia Cleverly, DO;  Location: [facility];  Service: Gastroenterology;;  EGD and COLON   BIV ICD INSERTION CRT-D N/A 04/14/2023   Procedure: BIV ICD INSERTION CRT-D;  Surgeon: Regan Lemming, MD;  Location: [facility] INVASIVE CV LAB;  Service: Cardiovascular;  Laterality: N/A;   CERVICAL FUSION      CHOLECYSTECTOMY     COLON SURGERY     COLONOSCOPY WITH PROPOFOL N/A 11/18/2021   Procedure: COLONOSCOPY WITH PROPOFOL;  Surgeon: Shellia Cleverly, DO;  Location: [facility];  Service: Gastroenterology;  Laterality: N/A;   ESOPHAGOGASTRODUODENOSCOPY (EGD) WITH PROPOFOL N/A 11/18/2021   Procedure: ESOPHAGOGASTRODUODENOSCOPY (EGD) WITH PROPOFOL;  Surgeon: Doristine Locks  V, DO;  Location: [facility];  Service: Gastroenterology;  Laterality: N/A;   HERNIA REPAIR     POLYPECTOMY  11/18/2021   Procedure: POLYPECTOMY;  Surgeon: Shellia Cleverly, DO;  Location: [facility];  Service: Gastroenterology;;   RIGHT HEART CATH N/A 02/13/2022   Procedure: RIGHT HEART CATH;  Surgeon: Kathleene Hazel, MD;  Location: [facility] INVASIVE CV LAB;  Service: Cardiovascular;  Laterality: N/A;    Allergies  Allergen Reactions   Fentanyl Other (See Comments)    Avoid opiates Other reaction(s): Other Avoid opiates   Other     04/06/18 Patient is a recovering narcotic abuser and, per wife, is NEVER to receive controlled substances (e.g. Narcotics, sedatives, etc.) Other reaction(s): Unknown 04/06/18 Patient is a recovering narcotic abuser and, per wife, is NEVER to receive controlled substances (e.g. Narcotics, sedatives, etc.)   Aspirin Nausea And Vomiting   Lipitor [Atorvastatin] Nausea And Vomiting    GI side effects.     Prednisone Other (See Comments)     Agitation Severe anger outbursts   Ofev [Nintedanib]     GI side effects   Pirfenidone    Contrast Media [Iodinated Contrast Media] Palpitations    Patient states he tolerates now   Nitroglycerin Nausea And Vomiting    Can take with Nausea Meditcation    Immunization History  Administered Date(s) Administered   Influenza Inj Mdck Quad Pf 06/18/2022   Influenza Split 07/16/2014   Influenza,inj,Quad PF,6+ Mos 07/16/2014, 07/21/2017, 08/22/2018, 06/13/2019, 06/10/2021   Influenza-Unspecified 07/16/2014, 06/14/2016   PFIZER Comirnaty(Gray  Top)Covid-19 Tri-Sucrose Vaccine 06/18/2022   PFIZER(Purple Top)SARS-COV-2 Vaccination 11/21/2019, 12/24/2019, 06/23/2020   Respiratory Syncytial Virus Vaccine,Recomb Aduvanted(Arexvy) 07/23/2022   Td 04/15/2015   Td (Adult), 2 Lf Tetanus Toxid, Preservative Free 04/15/2015   Td (Adult),5 Lf Tetanus Toxid, Preservative Free 04/15/2015   Tdap 10/10/2009   Zoster Recombinant(Shingrix) 06/10/2021    Family History  Problem Relation Age of Onset   Cancer Mother        breast   Depression Mother    Hyperlipidemia Mother    Dementia Mother    Alcohol abuse Father    Cancer Father        skin   Hyperlipidemia Father    Alzheimer's disease Father    Cancer Maternal Grandfather    Cancer Paternal Grandmother    Pancreatic cancer Paternal Uncle    Colon cancer Neg Hx    Esophageal cancer Neg Hx    Stomach cancer Neg Hx      Current Outpatient Medications:    albuterol (VENTOLIN HFA) 108 (90 Base) MCG/ACT inhaler, INHALE 2 PUFFS BY MOUTH EVERY 6 HOURS AS NEEDED, Disp: 18 each, Rfl: 3   AMBULATORY NON FORMULARY MEDICATION, Continuous positive airway pressure (CPAP) machine set at autotitration of H2O pressure, with all supplemental supplies as needed., Disp: 1 each, Rfl: 0   aspirin EC 81 MG tablet, Take 81 mg by mouth daily. Swallow whole., Disp: , Rfl:    busPIRone (BUSPAR) 15 MG tablet, Take 1/3 tablet p.o. twice daily for 1 week, then take 2/3 tablet p.o. twice daily for 1 week, then take 1 tablet p.o. twice daily, Disp: 60 tablet, Rfl: 0   chlorthalidone (HYGROTON) 25 MG tablet, TAKE 1 TABLET BY MOUTH EVERY DAY, Disp: 90 tablet, Rfl: 1   clopidogrel (PLAVIX) 75 MG tablet, Take 1 tablet (75 mg total) by mouth daily., Disp: 90 tablet, Rfl: 3   colestipol (COLESTID) 1 g tablet, TAKE 1 TABLET BY MOUTH EVERY DAY,  Disp: 90 tablet, Rfl: 1   dicyclomine (BENTYL) 20 MG tablet, TAKE 1 TABLET BY MOUTH THREE TIMES A DAY, Disp: 270 tablet, Rfl: 1   ENTRESTO 49-51 MG, Take 1 tablet by mouth 2  (two) times daily., Disp: 60 tablet, Rfl: 3   ezetimibe (ZETIA) 10 MG tablet, Take 1 tablet (10 mg total) by mouth at bedtime., Disp: 90 tablet, Rfl: 1   finasteride (PROSCAR) 5 MG tablet, TAKE 1 TABLET (5 MG TOTAL) BY MOUTH DAILY., Disp: 90 tablet, Rfl: 2   ibuprofen (ADVIL) 800 MG tablet, TAKE 1 TABLET BY MOUTH EVERY 8 HOURS AS NEEDED, Disp: 90 tablet, Rfl: 5   KLOR-CON M10 10 MEQ tablet, TAKE 1 TABLET BY MOUTH TWICE A DAY, Disp: 180 tablet, Rfl: 1   loratadine (CLARITIN) 10 MG tablet, Take 10 mg by mouth in the morning., Disp: , Rfl:    metoprolol succinate (TOPROL XL) 25 MG 24 hr tablet, Take 0.5 tablets (12.5 mg total) by mouth at bedtime., Disp: 45 tablet, Rfl: 3   Misc. Devices (PULSE OXIMETER) MISC, Monitor oxygen three times per day., Disp: 1 each, Rfl: 0   modafinil (PROVIGIL) 200 MG tablet, TAKE 1 TABLET BY MOUTH EVERY DAY, Disp: 30 tablet, Rfl: 1   ondansetron (ZOFRAN) 8 MG tablet, TAKE 1 TABLET BY MOUTH EVERY 8 HOURS AS NEEDED FOR NAUSEA FOR VOMITING, Disp: 270 tablet, Rfl: 1   OXYGEN, Inhale 3-4 L into the lungs continuous., Disp: , Rfl:    pantoprazole (PROTONIX) 40 MG tablet, TAKE 1 TABLET BY MOUTH TWICE A DAY, Disp: 180 tablet, Rfl: 1   Phenylephrine-Acetaminophen (TYLENOL SINUS CONGESTION/PAIN PO), Take 1 tablet by mouth daily as needed (Congestion)., Disp: , Rfl:    sertraline (ZOLOFT) 100 MG tablet, TAKE 2 TABLETS BY MOUTH EVERY DAY, Disp: 60 tablet, Rfl: 0   spironolactone (ALDACTONE) 25 MG tablet, Take 0.5 tablets (12.5 mg total) by mouth daily., Disp: 45 tablet, Rfl: 3   triamcinolone (NASACORT) 55 MCG/ACT AERO nasal inhaler, Place 2-3 sprays into the nose daily as needed (allergies (when pollen is very high))., Disp: , Rfl:   Current Facility-Administered Medications:    sodium chloride flush (NS) 0.9 % injection 3 mL, 3 mL, Intravenous, Q12H, Crenshaw, Madolyn Frieze, MD      Objective:   Vitals:   05/13/23 1311  BP: 118/70  Pulse: 70  SpO2: 94%  Weight: 226 lb (102.5  kg)    Estimated body mass index is 31.52 kg/m as calculated from the following:   Height as of 05/05/23: 5\' 11"  (1.803 m).   Weight as of this encounter: 226 lb (102.5 kg).  @WEIGHTCHANGE @  Filed Weights   05/13/23 1311  Weight: 226 lb (102.5 kg)     Physical Exam   General: No distress. obese O2 at rest: yes Cane present: no Sitting in wheel chair: no Frail: no Obese: no Neuro: Alert and Oriented x 3. GCS 15. Speech normal Psych: Pleasant Resp:  Barrel Chest - no.  Wheeze - no, Crackles - yes R  > L, No overt respiratory distress CVS: Normal heart sounds. Murmurs - no Ext: Stigmata of Connective Tissue Disease - no HEENT: Normal upper airway. PEERL +. No post nasal drip        Assessment:       ICD-10-CM   1. ILD (interstitial lung disease) (HCC)  J84.9     2. Chronic respiratory failure with hypoxia (HCC)  J96.11          Plan:  Patient Instructions  Chronic hypoxemic respiratory failure ILD (interstitial lung disease) (HCC)   Slowly and steadily getting worse/ progressive Too bad you cannot tolerare oral prednisone or esbriet or ofev   Plan - shared decision making  -Wil talk to pharmacy team and consider Depo-Medrol on scheduled basiss - No cellcept for now - meet Josh from [facility] - for ILD-PRO registry visit  - meet him 05/13/2023 -Continue oxygen therapy to keep your pulse ox goal greater than 88% -    Followup -Return to see  DR Raaswamy in 10 weeks   FOLLOWUP Return in about 10 weeks (around 07/22/2023) for 30 min visit, ILD, with Dr Marchelle Gearing, Face to Face Visit.  ( Level 05 visit E&M 2024: Estb >= 40 min visit type: on-site physical face to visit  in total care time and counseling or/and coordination of care by this undersigned MD - Dr Kalman Shan. This includes one or more of the following on this same day 05/13/2023: pre-charting, chart review, note writing, documentation discussion of test results, diagnostic or treatment  recommendations, prognosis, risks and benefits of management options, instructions, education, compliance or risk-factor reduction. It excludes time spent by the CMA or office staff in the care of the patient. Actual time 45 min)   SIGNATURE    Dr. Kalman Shan, M.D., F.C.C.P,  Pulmonary and Critical Care Medicine Staff Physician, [facility] Center Director - Interstitial Lung Disease  Program  Pulmonary Fibrosis [facility] at [facility] [place], [place], [address]  Pager: [phone], If no answer or between  15:00h - 7:00h: call [phone] Telephone: [phone]  1:46 PM 05/13/2023

## 2023-05-13 NOTE — Patient Instructions (Addendum)
Chronic hypoxemic respiratory failure ILD (interstitial lung disease) (HCC)   Slowly and steadily getting worse/ progressive Too bad you cannot tolerare oral prednisone or esbriet or ofev   Plan - shared decision making  -Wil talk to pharmacy team and consider Depo-Medrol on scheduled basiss - No cellcept for now - meet Josh from [facility] - for ILD-PRO registry visit  - meet him 05/13/2023 -Continue oxygen therapy to keep your pulse ox goal greater than 88% -    Followup -Return to see  DR Raaswamy in 10 weeks

## 2023-05-13 NOTE — Telephone Encounter (Signed)
Jake  Venetia Night [name]\has worsening ILD.  He is not able to tolerate pirfenidone or nintedanib.  He also is not wanting to do oral prednisone because it makes him have mood changes that are significantly bad.  He is willing to and wanting to do IM Depo-Medrol long-acting steroid.  He does not want to do CellCept at this point in time.  Plan - Would you recommend? -Would 80 mg of IM Depo-Medrol once a week be a good regimen?  = What prednisone dosage does this translate to?

## 2023-05-14 NOTE — Research (Cosign Needed)
Title: Chronic Fibrosing Interstitial Lung Disease with Progressive Phenotype Prospective Outcomes (ILD-PRO) Registry    Protocol #: IPF-PRO-SUB, Clinical Trials # [id number], Sponsor: [employer]   Protocol Version Amendment 6 dated 30Apr2024 and confirmed current on 06Sep2024 Consent Version for today's visit date of  Is [facility] Approved Version 22May2024 Revised 29Jul2024   Objectives:  Describe current approaches to diagnosis and treatment of chronic fibrosing ILDs with progressive phenotype  Describe the natural history of chronic fibrosing ILDs with progressive phenotype  Assess quality of life from self-administered participant reported questionnaires for each disease group  Describe participant interactions with the healthcare system, describe treatment practices across multiple institutions for each disease group  Collect biological samples linked to well characterized chronic fibrosing ILDs with progressive phenotype to identify disease biomarkers  Collect data and biological samples that will support future research studies.                                            Key Inclusion Criteria: Willing and able to provide informed consent  Age ? 30 years  Diagnosis of a non-IPF ILD of any duration, including, but not limited to Idiopathic Non-Specific Interstitial Pneumonia (INSIP), Unclassifiable Idiopathic Interstitial Pneumonias (IIPs), Interstitial Pneumonia with Autoimmune Features (IPAF), Autoimmune ILDs such as Rheumatoid Arthritis (RA-ILD) and Systemic Sclerosis (SSC-ILD), Chronic Hypersensitivity Pneumonitis (HP), Sarcoidosis or Exposure-related ILDs such as asbestosis.  Chronic fibrosing ILD defined by reticular abnormality with traction bronchiectasis with or without honeycombing confirmed by chest HRCT scan and/or lung biopsy.  Progressive phenotype as defined by fulfilling at least one of the criteria below of fibrotic changes (progression set  point) within the last 24 months regardless of treatment considered appropriate in individual ILDs:  decline in FVC % predicted (% pred) based on >10% relative decline  decline in FVC % pred based on ? 5 - <10% relative decline in FVC combined with worsening of respiratory symptoms as assessed by the site investigator  decline in FVC % pred based on ? 5 - <10% relative decline in FVC combined with increasing extent of fibrotic changes on chest imaging (HRCT scan) as assessed by the site investigator  decline in DLCO % pred based on ? 10% relative decline  worsening of respiratory symptoms as well as increasing extent of fibrotic changes on chest imaging (HRCT scan) as assessed by the site investigator independent of FVC change.     Key Exclusion Criteria: Malignancy, treated or untreated, other than skin or early stage prostate cancer, within the past 5 years  Currently listed for lung transplantation at the time of enrollment  Currently enrolled in a clinical trial at the time of enrollment in this registry       Clinical Research Coordinator / Research RN note : This visit for [name] Taraba  Subject [id number] with DOB:1960-09-30  on 05/Sep/2024  for the above protocol is Visit/Encounter 2 and is for purpose of research.    Subject expressed continued interest and consent in continuing as a study subject. Subject confirmed that there was no change in contact information (e.g. address, telephone, email). Subject thanked for participation in research and contribution to science.     During this visit on 05/Sep/2024  , the subject completed the blood work and questionnaires per the above referenced protocol. Please refer to the subject's paper source binder for further details.   Signed by Ivin Booty  [facility] Clinical Research Coodinator [facility]  [place], [place] 06/Sep/2024

## 2023-05-20 ENCOUNTER — Encounter: Payer: Self-pay | Admitting: Psychiatry

## 2023-05-20 ENCOUNTER — Telehealth (INDEPENDENT_AMBULATORY_CARE_PROVIDER_SITE_OTHER): Payer: Medicare HMO | Admitting: Psychiatry

## 2023-05-20 DIAGNOSIS — F411 Generalized anxiety disorder: Secondary | ICD-10-CM

## 2023-05-20 DIAGNOSIS — G4733 Obstructive sleep apnea (adult) (pediatric): Secondary | ICD-10-CM | POA: Diagnosis not present

## 2023-05-20 DIAGNOSIS — F39 Unspecified mood [affective] disorder: Secondary | ICD-10-CM | POA: Diagnosis not present

## 2023-05-20 MED ORDER — SERTRALINE HCL 100 MG PO TABS
200.0000 mg | ORAL_TABLET | Freq: Every day | ORAL | 1 refills | Status: DC
Start: 2023-05-20 — End: 2023-09-20

## 2023-05-20 MED ORDER — BUSPIRONE HCL 15 MG PO TABS
15.0000 mg | ORAL_TABLET | Freq: Two times a day (BID) | ORAL | 1 refills | Status: DC
Start: 2023-05-20 — End: 2023-09-20

## 2023-05-20 MED ORDER — MODAFINIL 200 MG PO TABS: 200.0000 mg | ORAL_TABLET | Freq: Every day | ORAL | 5 refills | Status: DC

## 2023-05-20 NOTE — Progress Notes (Signed)
RAMSSES STMARIE [id number] 1961/07/24 62 y.o.  Virtual Visit via Video Note  I connected with pt @ on 05/20/23 at  1:30 PM EDT by a video enabled telemedicine application and verified that I am speaking with the correct person using two identifiers.   I discussed the limitations of evaluation and management by telemedicine and the availability of in person appointments. The patient expressed understanding and agreed to proceed.  I discussed the assessment and treatment plan with the patient. The patient was provided an opportunity to ask questions and all were answered. The patient agreed with the plan and demonstrated an understanding of the instructions.   The patient was advised to call back or seek an in-person evaluation if the symptoms worsen or if the condition fails to improve as anticipated.  I provided 27 minutes of non-face-to-face time during this encounter.  The patient was located at home.  The provider was located in [place].   Corie Chiquito, PMHNP   Subjective:   Patient ID:  FARAJ OBORNY is a 62 y.o. (DOB [birth year]) male.  Chief Complaint:  Chief Complaint  Patient presents with   Anxiety   Follow-up    Depression    HPI YAMATO RICHESON presents for follow-up of anxiety and depression. 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 He reports that he had increased anxiety about 2 weeks ago when he called office and Buspar was re-started. He reports that he has been feeling tired "all the time" due to health issues. He reports that he has had a few panic attacks and sometimes these are last a few hours. Last panic attack was about 2 weeks. He reports worry and anxious thoughts are consistent with baseline. He and his wife reports that his mood has been "good." He reports that his motivation is good and he is physically limited.  He reports that his sleep is "ok." He reports that he is a "night owl" and awake until 1 am. Appetite has been "fine." He  reports mild concentration issues. Denies anhedonia. Denies SI.   Wife reports that she and her daughter noticed before that he "had a really short fuse" on Buspar and it seemed to improve when Buspar was stopped.   He and his wife are living with daughter and grandchildren (3 and 4 yo's). He reports that they are now in a bigger house.   Modafinil last filled 05/08/13  Past Psychiatric Medication Trials: Latuda- most effective and had jaw clinching/thrusting. Olanzapine Risperdal Seroquel Abilify Lithium Carbamazepine Topamax Effexor Sertraline Wellbutrin- not effective Gabapentin- excessive somnolence, unsteady, feel down stairs  Review of Systems:  Review of Systems  Respiratory:  Positive for shortness of breath.   Gastrointestinal:  Positive for nausea.       Occ loose stools  Endocrine: Positive for heat intolerance.  Musculoskeletal:  Negative for gait problem.  Psychiatric/Behavioral:         Please refer to HPI    Medications: I have reviewed the patient's current medications.  Current Outpatient Medications  Medication Sig Dispense Refill   albuterol (VENTOLIN HFA) 108 (90 Base) MCG/ACT inhaler INHALE 2 PUFFS BY MOUTH EVERY 6 HOURS AS NEEDED 18 each 3   aspirin EC 81 MG tablet Take 81 mg by mouth daily. Swallow whole.     chlorthalidone (HYGROTON) 25 MG tablet TAKE 1 TABLET BY MOUTH EVERY DAY 90 tablet 1   clopidogrel (PLAVIX) 75 MG tablet Take 1 tablet (75 mg total)  by mouth daily. 90 tablet 3   colestipol (COLESTID) 1 g tablet TAKE 1 TABLET BY MOUTH EVERY DAY 90 tablet 1   dicyclomine (BENTYL) 20 MG tablet TAKE 1 TABLET BY MOUTH THREE TIMES A DAY 270 tablet 1   ENTRESTO 49-51 MG Take 1 tablet by mouth 2 (two) times daily. 60 tablet 3   ezetimibe (ZETIA) 10 MG tablet Take 1 tablet (10 mg total) by mouth at bedtime. 90 tablet 1   finasteride (PROSCAR) 5 MG tablet TAKE 1 TABLET (5 MG TOTAL) BY MOUTH DAILY. 90 tablet 2   ibuprofen (ADVIL) 800 MG tablet TAKE 1  TABLET BY MOUTH EVERY 8 HOURS AS NEEDED 90 tablet 5   KLOR-CON M10 10 MEQ tablet TAKE 1 TABLET BY MOUTH TWICE A DAY 180 tablet 1   loratadine (CLARITIN) 10 MG tablet Take 10 mg by mouth in the morning.     metoprolol succinate (TOPROL XL) 25 MG 24 hr tablet Take 0.5 tablets (12.5 mg total) by mouth at bedtime. 45 tablet 3   ondansetron (ZOFRAN) 8 MG tablet TAKE 1 TABLET BY MOUTH EVERY 8 HOURS AS NEEDED FOR NAUSEA FOR VOMITING 270 tablet 1   OXYGEN Inhale 3-4 L into the lungs continuous.     pantoprazole (PROTONIX) 40 MG tablet TAKE 1 TABLET BY MOUTH TWICE A DAY 180 tablet 1   spironolactone (ALDACTONE) 25 MG tablet Take 0.5 tablets (12.5 mg total) by mouth daily. 45 tablet 3   triamcinolone (NASACORT) 55 MCG/ACT AERO nasal inhaler Place 2-3 sprays into the nose daily as needed (allergies (when pollen is very high)).     AMBULATORY NON FORMULARY MEDICATION Continuous positive airway pressure (CPAP) machine set at autotitration of H2O pressure, with all supplemental supplies as needed. 1 each 0   busPIRone (BUSPAR) 15 MG tablet Take 1 tablet (15 mg total) by mouth 2 (two) times daily. 180 tablet 1   Misc. Devices (PULSE OXIMETER) MISC Monitor oxygen three times per day. 1 each 0   [START ON 06/06/2023] modafinil (PROVIGIL) 200 MG tablet Take 1 tablet (200 mg total) by mouth daily. 30 tablet 5   Phenylephrine-Acetaminophen (TYLENOL SINUS CONGESTION/PAIN PO) Take 1 tablet by mouth daily as needed (Congestion). (Patient not taking: Reported on 05/20/2023)     sertraline (ZOLOFT) 100 MG tablet Take 2 tablets (200 mg total) by mouth daily. 180 tablet 1   Current Facility-Administered Medications  Medication Dose Route Frequency Provider Last Rate Last Admin   sodium chloride flush (NS) 0.9 % injection 3 mL  3 mL Intravenous Q12H Jens Som Madolyn Frieze, MD        Medication Side Effects: Other: Reports some GI upset with Metoprolol  Allergies:  Allergies  Allergen Reactions   Fentanyl Other (See Comments)     Avoid opiates Other reaction(s): Other Avoid opiates   Other     04/06/18 Patient is a recovering narcotic abuser and, per wife, is NEVER to receive controlled substances (e.g. Narcotics, sedatives, etc.) Other reaction(s): Unknown 04/06/18 Patient is a recovering narcotic abuser and, per wife, is NEVER to receive controlled substances (e.g. Narcotics, sedatives, etc.)   Aspirin Nausea And Vomiting   Lipitor [Atorvastatin] Nausea And Vomiting    GI side effects.     Prednisone Other (See Comments)     Agitation Severe anger outbursts   Gabapentin     Excessive somnolence, unsteadiness, falls   Ofev [Nintedanib]     GI side effects   Pirfenidone    Contrast Media [Iodinated Contrast  Media] Palpitations    Patient states he tolerates now   Nitroglycerin Nausea And Vomiting    Can take with Nausea Meditcation    Past Medical History:  Diagnosis Date   Arthritis    lower back   Bipolar disorder (HCC)    GERD (gastroesophageal reflux disease)    Hyperlipidemia    Hypertension    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    Family History  Problem Relation Age of Onset   Cancer Mother        breast   Depression Mother    Hyperlipidemia Mother    Dementia Mother    Alcohol abuse Father    Cancer Father        skin   Hyperlipidemia Father    Alzheimer's disease Father    Cancer Maternal Grandfather    Cancer Paternal Grandmother    Pancreatic cancer Paternal Uncle    Colon cancer Neg Hx    Esophageal cancer Neg Hx    Stomach cancer Neg Hx     Social History   Socioeconomic History   Marital status: Married    Spouse name: Dawn   Number of children: 1   Years of education: 14   Highest education level: Some college, no degree  Occupational History   Occupation: Disabled  Tobacco Use   Smoking status: Former    Current packs/day: 0.00    Average packs/day: 1 pack/day for 28.0 years (28.0 ttl pk-yrs)    Types: Cigarettes    Start  date: 13    Quit date: 2010    Years since quitting: 14.7   Smokeless tobacco: Never  Vaping Use   Vaping status: Never Used  Substance and Sexual Activity   Alcohol use: No   Drug use: No    Comment: 04/06/2018 Patient's wife wanted [place] to be very aware that her husband is a recovering narcotic abuser   Sexual activity: Yes  Other Topics Concern   Not on file  Social History Narrative   Lives with his wife, daughter, son-in-law and 2 two grandchildren. Likes to spend to time with grandchildren. Does not exercise due to back issues in the past and respiratory problems.   Social Determinants of Health   Financial Resource Strain: Low Risk  (02/04/2022)   Overall Financial Resource Strain (CARDIA)    Difficulty of Paying Living Expenses: Not hard at all  Food Insecurity: Low Risk  (11/16/2022)   [facility]   Hunger Vital Sign    Worried About Running Out of Food in the Last Year: Never true    Ran Out of Food in the Last Year: Never true  Transportation Needs: Unmet Transportation Needs (11/16/2022)   [facility]   Transportation    In the past 12 months, has lack of reliable transportation kept you from medical appointments, meetings, work or from getting things needed for daily living? : Yes  Physical Activity: Inactive (02/04/2022)   Exercise Vital Sign    Days of Exercise per Week: 0 days    Minutes of Exercise per Session: 0 min  Stress: No Stress Concern Present (02/04/2022)   [employer] of Occupational Health - Occupational Stress Questionnaire    Feeling of Stress : Not at all  Social Connections: Moderately Integrated (02/04/2022)   Social Connection and Isolation Panel [NHANES]    Frequency of Communication with Friends and Family: Once a week    Frequency  of Social Gatherings with Friends and Family: More than three times a week    Attends Religious Services: More than 4 times per year    Active Member  of [employer] or Organizations: No    Attends Banker Meetings: Never    Marital Status: Married  Catering manager Violence: Not At Risk (02/04/2022)   Humiliation, Afraid, Rape, and Kick questionnaire    Fear of Current or Ex-Partner: No    Emotionally Abused: No    Physically Abused: No    Sexually Abused: No    Past Medical History, Surgical history, Social history, and Family history were reviewed and updated as appropriate.   Please see review of systems for further details on the patient's review from today.   Objective:   Physical Exam:  There were no vitals taken for this visit.  Physical Exam Neurological:     Mental Status: He is alert and oriented to person, place, and time.     Cranial Nerves: No dysarthria.  Psychiatric:        Attention and Perception: Attention and perception normal.        Mood and Affect: Mood is anxious.        Speech: Speech normal.        Behavior: Behavior is cooperative.        Thought Content: Thought content normal. Thought content is not paranoid or delusional. Thought content does not include homicidal or suicidal ideation. Thought content does not include homicidal or suicidal plan.        Cognition and Memory: Cognition and memory normal.        Judgment: Judgment normal.     Comments: Insight intact     Lab Review:     Component Value Date/Time   NA 133 (L) 03/30/2023 1340   K 3.8 03/30/2023 1340   CL 91 (L) 03/30/2023 1340   CO2 23 03/30/2023 1340   GLUCOSE 155 (H) 03/30/2023 1340   GLUCOSE 92 02/16/2023 1508   BUN 26 03/30/2023 1340   CREATININE 1.29 (H) 03/30/2023 1340   CREATININE 1.29 11/27/2022 1446   CALCIUM 9.9 03/30/2023 1340   PROT 7.5 02/16/2023 1508   PROT 7.2 11/27/2022 1437   ALBUMIN 4.6 02/16/2023 1508   ALBUMIN 4.7 11/27/2022 1437   AST 31 02/16/2023 1508   ALT 23 02/16/2023 1508   ALKPHOS 60 02/16/2023 1508   BILITOT 0.3 02/16/2023 1508   BILITOT 0.2 11/27/2022 1437   GFRNONAA >60  09/03/2021 1854   GFRNONAA 60 03/20/2020 1044   GFRAA 69 03/20/2020 1044       Component Value Date/Time   WBC 5.9 03/30/2023 1340   WBC 6.4 02/16/2023 1508   RBC 4.97 03/30/2023 1340   RBC 4.81 02/16/2023 1508   HGB 13.1 03/30/2023 1340   HCT 40.4 03/30/2023 1340   PLT 268 03/30/2023 1340   MCV 81 03/30/2023 1340   MCH 26.4 (L) 03/30/2023 1340   MCH 28.5 09/03/2021 1854   MCHC 32.4 03/30/2023 1340   MCHC 32.8 02/16/2023 1508   RDW 14.4 03/30/2023 1340   LYMPHSABS 0.9 02/16/2023 1508   MONOABS 0.8 02/16/2023 1508   EOSABS 0.3 02/16/2023 1508   BASOSABS 0.0 02/16/2023 1508    No results found for: "POCLITH", "LITHIUM"   No results found for: "PHENYTOIN", "PHENOBARB", "VALPROATE", "CBMZ"   .res Assessment: Plan:    32 minutes spent dedicated to the care of this patient on the date of this encounter to include pre-visit review  of records, ordering of medication, post visit documentation, and face-to-face time with the patient discussing treatment options for anxiety, to include Buspar or considering switching Sertraline to another medication if it is not adequately controlling his anxiety. Recommended avoiding atypical antipsychotics if possible due to risk of increasing glucose levels and cholesterol. He reports that he would like to continue titrating dose of Buspar to determine if this will be helpful for his anxiety without causing significant irritability.  Continue Sertraline 200 mg daily for anxiety and depression.  Continue Modafinil 200 mg daily for fatigue and excessive somnolence due to OSA.  Pt to follow-up in 6 months or sooner if clinically indicated.  Patient advised to contact office with any questions, adverse effects, or acute worsening in signs and symptoms.   Darshawn was seen today for anxiety and follow-up.  Diagnoses and all orders for this visit:  Generalized anxiety disorder -     sertraline (ZOLOFT) 100 MG tablet; Take 2 tablets (200 mg total) by  mouth daily. -     busPIRone (BUSPAR) 15 MG tablet; Take 1 tablet (15 mg total) by mouth 2 (two) times daily.  OSA on CPAP -     modafinil (PROVIGIL) 200 MG tablet; Take 1 tablet (200 mg total) by mouth daily.  Episodic mood disorder (HCC) -     sertraline (ZOLOFT) 100 MG tablet; Take 2 tablets (200 mg total) by mouth daily.     Please see After Visit Summary for patient specific instructions.  Future Appointments  Date Time Provider Department Center  07/16/2023  7:05 AM [facility] DEVICE REMOTES [facility]  07/19/2023  2:45 PM Camnitz, Andree Coss, MD [facility] None  09/15/2023  4:00 PM Lewayne Bunting, MD [facility] None  10/15/2023  7:05 AM [facility] DEVICE REMOTES [facility]  01/14/2024  7:05 AM [facility] DEVICE REMOTES [facility]  04/14/2024  7:05 AM [facility] DEVICE REMOTES [facility]  07/14/2024  7:05 AM [facility] DEVICE REMOTES [facility]  10/13/2024  7:05 AM [facility] DEVICE REMOTES [facility]    No orders of the defined types were placed in this encounter.     -------------------------------

## 2023-05-28 ENCOUNTER — Other Ambulatory Visit: Payer: Self-pay | Admitting: Physician Assistant

## 2023-05-28 ENCOUNTER — Other Ambulatory Visit: Payer: Self-pay | Admitting: Psychiatry

## 2023-05-28 ENCOUNTER — Other Ambulatory Visit: Payer: Self-pay | Admitting: Cardiology

## 2023-05-28 DIAGNOSIS — M545 Low back pain, unspecified: Secondary | ICD-10-CM

## 2023-05-28 DIAGNOSIS — I5022 Chronic systolic (congestive) heart failure: Secondary | ICD-10-CM

## 2023-05-28 DIAGNOSIS — J9611 Chronic respiratory failure with hypoxia: Secondary | ICD-10-CM

## 2023-05-28 DIAGNOSIS — M62830 Muscle spasm of back: Secondary | ICD-10-CM

## 2023-05-28 DIAGNOSIS — F411 Generalized anxiety disorder: Secondary | ICD-10-CM

## 2023-05-28 DIAGNOSIS — J849 Interstitial pulmonary disease, unspecified: Secondary | ICD-10-CM

## 2023-05-28 DIAGNOSIS — F39 Unspecified mood [affective] disorder: Secondary | ICD-10-CM

## 2023-05-29 DIAGNOSIS — J849 Interstitial pulmonary disease, unspecified: Secondary | ICD-10-CM | POA: Diagnosis not present

## 2023-05-31 NOTE — Telephone Encounter (Signed)
PT calling about the long term Pred shot. He was wondering what Dr. Marchelle Gearing had found out about it. Pls call PT @ [phone]

## 2023-05-31 NOTE — Telephone Encounter (Signed)
80mg  of IM Depo-Medrol = 100mg  PO prednisone Use of even weekly intramuscular doses of steroids may still cause same side effects asd oral steroids though may be intermittent and wane over the course of the week. Getting high-dose steroids also places pt at risk for immunosuppression which I understand is a concern for him with his grandchildren  Could not find any compelling evidence regarding benefit with weekly steroid bursts but since steroids are used for flares, there is anecdotal benefit for airway inflammation  PMH also significant for HRN, CAD, IBS  Pt may need PJP ppx with long-term, high-dose steroid use  Chesley Mires, PharmD, MPH, BCPS, CPP Clinical Pharmacist (Rheumatology and Pulmonology)

## 2023-06-01 NOTE — Telephone Encounter (Signed)
PT calling back about this LT pred shot. I will tell him we are still working on an answer for him. Thank you.

## 2023-06-02 ENCOUNTER — Ambulatory Visit (INDEPENDENT_AMBULATORY_CARE_PROVIDER_SITE_OTHER): Payer: Medicare HMO | Admitting: Physician Assistant

## 2023-06-02 ENCOUNTER — Encounter: Payer: Self-pay | Admitting: Physician Assistant

## 2023-06-02 VITALS — BP 136/77 | HR 80 | Ht 71.0 in | Wt 228.0 lb

## 2023-06-02 DIAGNOSIS — R0602 Shortness of breath: Secondary | ICD-10-CM

## 2023-06-02 DIAGNOSIS — J302 Other seasonal allergic rhinitis: Secondary | ICD-10-CM | POA: Diagnosis not present

## 2023-06-02 DIAGNOSIS — J209 Acute bronchitis, unspecified: Secondary | ICD-10-CM | POA: Diagnosis not present

## 2023-06-02 DIAGNOSIS — J9611 Chronic respiratory failure with hypoxia: Secondary | ICD-10-CM

## 2023-06-02 DIAGNOSIS — J849 Interstitial pulmonary disease, unspecified: Secondary | ICD-10-CM

## 2023-06-02 DIAGNOSIS — R051 Acute cough: Secondary | ICD-10-CM

## 2023-06-02 DIAGNOSIS — J45901 Unspecified asthma with (acute) exacerbation: Secondary | ICD-10-CM

## 2023-06-02 MED ORDER — IPRATROPIUM-ALBUTEROL 0.5-2.5 (3) MG/3ML IN SOLN
3.0000 mL | RESPIRATORY_TRACT | 0 refills | Status: DC | PRN
Start: 2023-06-02 — End: 2023-07-02

## 2023-06-02 MED ORDER — METHYLPREDNISOLONE SODIUM SUCC 125 MG IJ SOLR
125.0000 mg | Freq: Once | INTRAMUSCULAR | Status: AC
Start: 2023-06-02 — End: 2023-06-02
  Administered 2023-06-02: 125 mg via INTRAVENOUS

## 2023-06-02 MED ORDER — DOXYCYCLINE HYCLATE 100 MG PO TABS
100.0000 mg | ORAL_TABLET | Freq: Two times a day (BID) | ORAL | 0 refills | Status: DC
Start: 2023-06-02 — End: 2023-06-30

## 2023-06-02 NOTE — Patient Instructions (Signed)
Hold doxycycline  Use nebulizer every 2-4 hours

## 2023-06-02 NOTE — Progress Notes (Unsigned)
Acute Office Visit  Subjective:     Patient ID: GE SLASKI, male    DOB: 06/08/1961, 62 y.o.   MRN: [id number]  No chief complaint on file.   HPI Patient is in today for cough, chest tightness, SOB worsening for the last 3 days. He has pulmonologist but has not heard back from them. He has ILD and chronic respiratory failure with CHF and seasonal allergies. His allergies often induce worsening breathing for him. He is on 3-4 L of continuous O2. His pulse ox sits around 90-92 percent. He denies any fever, productive sputum, body aches, chills, nausea, vomiting. He does have nasal congestion and PND. The worst is is coughing fits that worsen his breathing and make him feel like he is going to pass out. He has used albuterol inhaler at home and helped a little.   .. 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HCC) 05/27/2017   History of pancreatitis 05/27/2017   Diverticulitis 05/27/2017   Elevated fasting glucose 06/10/2017   Low testosterone 06/10/2017   Benign prostatic hyperplasia (BPH) with urinary urgency 06/12/2017   No energy 06/12/2017   Former smoker 07/25/2017   Cough 07/25/2017   Low testosterone in male 07/25/2017   Non-restorative sleep 07/25/2017   Anxiety 10/30/2015   Chronic nausea 10/30/2015   Major depressive disorder 10/30/2015   Migraine 10/30/2015   Neck pain 10/30/2015   Occipital neuralgia 10/30/2015   Substance abuse in remission (HCC) 04/07/2018   Bipolar disorder (HCC) 07/04/2018    Hearing difficulty of right ear 01/16/2019   IFG (impaired fasting glucose) 03/20/2019   Coronary artery disease due to calcified coronary lesion 11/03/2019   Thoracic aorta atherosclerosis (HCC) 11/03/2019   Aortic atherosclerosis (HCC) 11/03/2019   Hepatic steatosis 11/03/2019   SOB (shortness of breath) on exertion 11/06/2019   Precordial pain 11/06/2019   Essential hypertension 11/06/2019   Obesity (BMI 30-39.9) 11/06/2019   Aneurysm of ascending aorta (HCC) 11/10/2019   Hypoxia 02/21/2020   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/2020   Sinobronchitis 07/10/2020   Decreased cardiac ejection fraction 11/14/2020   Cardiomyopathy (HCC) 11/14/2020   Dyspnea 11/14/2020   Left lower quadrant abdominal pain 04/11/2021   Chronic systolic congestive heart failure, NYHA class 3 (HCC) 05/30/2021   IPF (idiopathic pulmonary fibrosis) (HCC) 06/10/2021   Pre-diabetes 06/10/2021   Class 1 obesity due to excess calories with serious comorbidity and body mass index (BMI) of 31.0 to 31.9 in adult 06/10/2021   Cirrhosis of liver without ascites (HCC) 06/15/2021   Dysfunction of both eustachian tubes 07/08/2021   Eye discharge 08/11/2021   Photophobia of both eyes 08/11/2021   Eye pain, bilateral 08/11/2021   Diarrhea 09/05/2021   Abdominal distention 09/05/2021   Generalized abdominal pain 09/05/2021   Suspicious nevus 11/11/2021   Change in bowel habits    Gastritis and gastroduodenitis    Colon cancer screening    Hematochezia    Grade II internal hemorrhoids    Adenomatous polyp of sigmoid colon    Rotator cuff tendonitis, left  12/24/2021   Sciatica of left side 12/24/2021   ILD (interstitial lung disease) (HCC) 02/09/2022   Chronic respiratory failure with hypoxia (HCC) 02/10/2022   SOB (shortness of breath) 02/10/2022   Left bundle branch block 11/20/2022   H/O heart artery stent 11/20/2022   NSTEMI (non-ST elevated myocardial infarction) (HCC) 11/20/2022   Hyponatremia 11/20/2022    Coronary artery disease involving native coronary artery of native heart without angina pectoris 11/20/2022   Hypotension 11/20/2022   Statin myopathy 11/20/2022   Bronchitis 02/26/2023   Seasonal allergies 02/26/2023   CHF (congestive heart failure) (HCC) 04/14/2023   Acute bronchitis, complicated 06/03/2023   Resolved Ambulatory Problems    Diagnosis Date Noted   Essential hypertension, benign 05/19/2016   Community acquired pneumonia of left lower lobe of lung 11/20/2016   Impacted cerumen of right ear 01/16/2019   Past Medical History:  Diagnosis Date   Arthritis    Hypertension       ROS  See HPI.     Objective:    BP 136/77   Pulse 80   Ht 5\' 11"  (1.803 m)   Wt 228 lb (103.4 kg)   SpO2 91%   BMI 31.80 kg/m  BP Readings from Last 3 Encounters:  06/02/23 136/77  05/13/23 118/70  05/05/23 131/73   Wt Readings from Last 3 Encounters:  06/02/23 228 lb (103.4 kg)  05/13/23 226 lb (102.5 kg)  05/05/23 226 lb (102.5 kg)      Physical Exam Constitutional:      Appearance: Normal appearance. He is obese.  HENT:     Head: Normocephalic.     Comments: No sinus tenderness to palpation    Right Ear: Tympanic membrane, ear canal and external ear normal. There is no impacted cerumen.     Left Ear: Tympanic membrane, ear canal and external ear normal. There is no impacted cerumen.     Nose: Congestion present.     Mouth/Throat:     Mouth: Mucous membranes are moist.     Pharynx: No oropharyngeal exudate or posterior oropharyngeal erythema.  Eyes:     General:        Right eye: No discharge.        Left eye: No discharge.     Extraocular Movements: Extraocular movements intact.     Conjunctiva/sclera: Conjunctivae normal.  Cardiovascular:     Rate and Rhythm: Normal rate and regular rhythm.  Pulmonary:     Comments: Labored breathing with 3L of O2 Diminished breath sounds in bilateral lungs Musculoskeletal:     Cervical back: Neck supple. No tenderness.   Lymphadenopathy:     Cervical: No cervical adenopathy.  Neurological:     General: No focal deficit present.     Mental Status: He is alert and oriented to person, place, and time.  Psychiatric:        Mood and Affect: Mood normal.           Assessment & Plan:  [name] KitchenMarland KitchenDiagnoses and all orders for this visit:  Acute bronchitis, complicated -     ipratropium-albuterol (DUONEB) 0.5-2.5 (3) MG/3ML SOLN; Take 3 mLs by nebulization every 2 (two) hours as needed (wheeze, SOB). -     doxycycline (VIBRA-TABS) 100 MG tablet; Take 1 tablet (100 mg total) by mouth 2 (two) times daily. -     methylPREDNISolone sodium succinate (SOLU-MEDROL) 125 mg/2 mL injection 125 mg  ILD (interstitial lung disease) (HCC) -     ipratropium-albuterol (DUONEB) 0.5-2.5 (3) MG/3ML SOLN;  Take 3 mLs by nebulization every 2 (two) hours as needed (wheeze, SOB). -     methylPREDNISolone sodium succinate (SOLU-MEDROL) 125 mg/2 mL injection 125 mg  Chronic respiratory failure with hypoxia (HCC) -     ipratropium-albuterol (DUONEB) 0.5-2.5 (3) MG/3ML SOLN; Take 3 mLs by nebulization every 2 (two) hours as needed (wheeze, SOB). -     methylPREDNISolone sodium succinate (SOLU-MEDROL) 125 mg/2 mL injection 125 mg  SOB (shortness of breath) -     methylPREDNISolone sodium succinate (SOLU-MEDROL) 125 mg/2 mL injection 125 mg  Acute cough -     methylPREDNISolone sodium succinate (SOLU-MEDROL) 125 mg/2 mL injection 125 mg  Seasonal allergies   Pt does not tolerate oral prednisone I suspect allergies that have caused an exacerbation of his ILD and inflammation Shot of solumedrol given 125mg  today Duoneb sent to pharmacy to use in nebulizer at home every 3-4 hours as needed Continue on same preventative medications Doxycycline given to hold and see if improves but ok to start if symptoms worsening Keep follow ups with pulmonology  Tandy Gaw, PA-C

## 2023-06-03 ENCOUNTER — Encounter: Payer: Self-pay | Admitting: Physician Assistant

## 2023-06-03 DIAGNOSIS — J209 Acute bronchitis, unspecified: Secondary | ICD-10-CM | POA: Insufficient documentation

## 2023-06-07 NOTE — Telephone Encounter (Signed)
Let us started 80 mg IM Depo-Medrol once a week which would be like taking 14 mg of prednisone once daily.  We can start you and then escalate.  We can address the PJP prophylaxis later during follow-up.

## 2023-06-09 NOTE — Telephone Encounter (Signed)
Email sent to [facility], Health visitor, for clarification on how to coordinate this for pt (potentially on nurse visit schedule?)  Email sent to infusion center to see if they are able to do this for patietn as well

## 2023-06-14 ENCOUNTER — Other Ambulatory Visit (HOSPITAL_COMMUNITY): Payer: Self-pay

## 2023-06-14 NOTE — Telephone Encounter (Signed)
Spoke with patient regarding possibly building nursing visit template - he prefers early afternoons and prefers to avoid Tuesdays.

## 2023-06-21 NOTE — Telephone Encounter (Signed)
While I was OOO, patient called regarding steroid shots however I did not leave any message for him. Left VM for him today stating nursing team is working on this currently  Chesley Mires, PharmD, MPH, BCPS, CPP Clinical Pharmacist (Rheumatology and Pulmonology)

## 2023-06-28 DIAGNOSIS — J849 Interstitial pulmonary disease, unspecified: Secondary | ICD-10-CM | POA: Diagnosis not present

## 2023-06-29 ENCOUNTER — Encounter: Payer: Self-pay | Admitting: Physician Assistant

## 2023-06-30 ENCOUNTER — Encounter: Payer: Self-pay | Admitting: Physician Assistant

## 2023-06-30 ENCOUNTER — Ambulatory Visit: Payer: Medicare HMO | Admitting: Physician Assistant

## 2023-06-30 VITALS — BP 151/73 | HR 83 | Ht 71.0 in | Wt 226.0 lb

## 2023-06-30 DIAGNOSIS — Z8719 Personal history of other diseases of the digestive system: Secondary | ICD-10-CM | POA: Diagnosis not present

## 2023-06-30 DIAGNOSIS — R197 Diarrhea, unspecified: Secondary | ICD-10-CM

## 2023-06-30 DIAGNOSIS — R1084 Generalized abdominal pain: Secondary | ICD-10-CM | POA: Diagnosis not present

## 2023-06-30 MED ORDER — METHYLPREDNISOLONE ACETATE 40 MG/ML IJ SUSP
80.0000 mg | Freq: Once | INTRAMUSCULAR | Status: DC
Start: 2023-06-30 — End: 2024-03-20

## 2023-06-30 NOTE — Progress Notes (Signed)
Established Patient Office Visit  Subjective   Patient ID: Jake [name], male    DOB: [birth year]  Age: 62 y.o. MRN: [id number]  Chief Complaint  Patient presents with   Abdominal Pain    Diarrhea     HPI Pt is a 62 yo obese male with Chronic respiratory distress and history of diverticulitis who presents to the clinic with abdominal pain, bloating, diarrhea and nausea for 5 days. He is having 25 loose stools a day. He denies any fever. He states his abdominal pain does not feel like "diverticulitis". He has not started any new medications. Noone in the house is sick. He has not had any recent travel. Wife is worried about liver failure due to his cirrhosis. There has been some bright red stools but no tarry stools. He has hx of pancreatitis but states it does not feel like that. He has ongoing chronic nausea.  .. 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HCC) 05/27/2017   History of pancreatitis 05/27/2017   Diverticulitis 05/27/2017   Elevated fasting glucose 06/10/2017   Low testosterone 06/10/2017   Benign prostatic hyperplasia (BPH) with urinary urgency 06/12/2017   No energy 06/12/2017   Former smoker 07/25/2017   Cough 07/25/2017   Low testosterone in male 07/25/2017   Non-restorative sleep 07/25/2017   Anxiety 10/30/2015   Chronic nausea 10/30/2015   Major depressive disorder 10/30/2015   Migraine 10/30/2015   Neck pain 10/30/2015   Occipital neuralgia 10/30/2015   Substance abuse in remission (HCC)  04/07/2018   Bipolar disorder (HCC) 07/04/2018   Hearing difficulty of right ear 01/16/2019   IFG (impaired fasting glucose) 03/20/2019   Coronary artery disease due to calcified coronary lesion 11/03/2019   Thoracic aorta atherosclerosis (HCC) 11/03/2019   Aortic atherosclerosis (HCC) 11/03/2019   Hepatic steatosis 11/03/2019   SOB (shortness of breath) on exertion 11/06/2019   Precordial pain 11/06/2019   Essential hypertension 11/06/2019   Obesity (BMI 30-39.9) 11/06/2019   Aneurysm of ascending aorta (HCC) 11/10/2019   Hypoxia 02/21/2020   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/2020   Sinobronchitis 07/10/2020   Decreased cardiac ejection fraction 11/14/2020   Cardiomyopathy (HCC) 11/14/2020   Dyspnea 11/14/2020   Left lower quadrant abdominal pain 04/11/2021   Chronic systolic congestive heart failure, NYHA class 3 (HCC) 05/30/2021   IPF (idiopathic pulmonary fibrosis) (HCC) 06/10/2021   Pre-diabetes 06/10/2021   Class 1 obesity due to excess calories with serious comorbidity and body mass index (BMI) of 31.0 to 31.9 in adult 06/10/2021   Cirrhosis of liver without ascites (HCC) 06/15/2021   Dysfunction of both eustachian tubes 07/08/2021   Eye discharge 08/11/2021   Photophobia of both eyes 08/11/2021   Eye pain, bilateral 08/11/2021   Diarrhea 09/05/2021   Abdominal distention 09/05/2021   Generalized abdominal pain 09/05/2021   Suspicious nevus 11/11/2021   Change in bowel habits    Gastritis and gastroduodenitis    Colon cancer screening    Hematochezia    Grade II internal hemorrhoids    Adenomatous polyp  of sigmoid colon    Rotator cuff tendonitis, left 12/24/2021   Sciatica of left side 12/24/2021   ILD (interstitial lung disease) (HCC) 02/09/2022   Chronic respiratory failure with hypoxia (HCC) 02/10/2022   SOB (shortness of breath) 02/10/2022   Left bundle branch block 11/20/2022   H/O heart artery stent 11/20/2022   NSTEMI (non-ST elevated myocardial infarction)  (HCC) 11/20/2022   Hyponatremia 11/20/2022   Coronary artery disease involving native coronary artery of native heart without angina pectoris 11/20/2022   Hypotension 11/20/2022   Statin myopathy 11/20/2022   Bronchitis 02/26/2023   Seasonal allergies 02/26/2023   CHF (congestive heart failure) (HCC) 04/14/2023   Acute bronchitis, complicated 06/03/2023   History of diverticulitis 06/30/2023   Resolved Ambulatory Problems    Diagnosis Date Noted   Essential hypertension, benign 05/19/2016   Community acquired pneumonia of left lower lobe of lung 11/20/2016   Impacted cerumen of right ear 01/16/2019   Past Medical History:  Diagnosis Date   Arthritis    Hypertension       ROS   See HPI.  Objective:     BP (!) 151/73   Pulse 83   Ht 5\' 11"  (1.803 m)   Wt 226 lb (102.5 kg)   SpO2 99%   BMI 31.52 kg/m  BP Readings from Last 3 Encounters:  06/30/23 (!) 151/73  06/02/23 136/77  05/13/23 118/70   Wt Readings from Last 3 Encounters:  06/30/23 226 lb (102.5 kg)  06/02/23 228 lb (103.4 kg)  05/13/23 226 lb (102.5 kg)      Physical Exam Constitutional:      Appearance: He is well-developed. He is obese.  HENT:     Head: Normocephalic.  Cardiovascular:     Rate and Rhythm: Normal rate.  Pulmonary:     Comments: On 3L of O2 with labored breathing Abdominal:     General: Bowel sounds are normal. There is distension.     Palpations: Abdomen is soft.     Tenderness: There is generalized abdominal tenderness. There is no right CVA tenderness, left CVA tenderness, guarding or rebound. Negative signs include Murphy's sign, Rovsing's sign, McBurney's sign, psoas sign and obturator sign.     Hernia: A hernia is present. Hernia is present in the ventral area.  Neurological:     Mental Status: He is alert.  Psychiatric:        Mood and Affect: Mood normal.          Assessment & Plan:  [name] KitchenMarland KitchenVerne was seen today for abdominal pain.  Diagnoses and all orders for  this visit:  Diarrhea, unspecified type -     CBC w/Diff/Platelet -     Cdiff NAA+O+P+Stool Culture -     CMP14+EGFR -     Lipase -     CT ABDOMEN PELVIS W CONTRAST; Future -     methylPREDNISolone acetate (DEPO-MEDROL) injection 80 mg  Generalized abdominal pain -     CBC w/Diff/Platelet -     Cdiff NAA+O+P+Stool Culture -     CMP14+EGFR -     Lipase -     CT ABDOMEN PELVIS W CONTRAST; Future -     methylPREDNISolone acetate (DEPO-MEDROL) injection 80 mg  History of diverticulitis -     CBC w/Diff/Platelet -     Cdiff NAA+O+P+Stool Culture -     CMP14+EGFR -     Lipase -     CT ABDOMEN PELVIS W CONTRAST; Future -     methylPREDNISolone  acetate (DEPO-MEDROL) injection 80 mg   Concern for diverticulitis vs colitis vs pancreatitis vs partial bowel obstruction Will get CT of abdomen Will order cbc/cmp/lipase Will get stool culture  Depo medrol 80mg  given IM to help with any colitis symptoms BRAT diet Ok to take immodium as needed Discussed importance of staying hydrated   Tandy Gaw, PA-C

## 2023-06-30 NOTE — Patient Instructions (Addendum)
Colitis  Colitis is a condition in which the colon is inflamed. It can cause diarrhea, blood in the stool, and abdominal pain. Colitis can last a short time (be acute), or it may last a long time (become chronic). What are the causes? This condition may be caused by: Infections from viruses or bacteria. A reaction to medicine. Certain autoimmune diseases, such as Crohn's disease or ulcerative colitis. Radiation treatment. Decreased blood flow to the bowel (ischemia). What are the signs or symptoms? Symptoms of this condition include: Diarrhea, blood in the stool, or black, tarry stool. Pain in the joints or abdominal pain. Fever or fatigue. Vomiting. Weight loss. Bloating. Having fewer bowel movements than usual. A strong and sudden urge to have a bowel movement. Feeling like the bowel is not empty after a bowel movement. How is this diagnosed? This condition may be diagnosed based on a stool test and a blood test. You may also have other tests, such as: X-rays. CT scan. Colonoscopy. Endoscopy. Biopsy. How is this treated? Treatment for this condition depends on the cause. This condition may be treated with: Steps to rest the bowel, such as not eating or drinking for a period of time. Fluids that are given through an IV. Medicine for pain and diarrhea. Antibiotic medicines. Cortisone medicines. Surgery. Follow these instructions at home: Eating and drinking  Follow instructions from your health care provider about eating or drinking restrictions. Drink enough fluid to keep your urine pale yellow. Work with a dietitian to determine whether certain foods cause your condition to flare up. Avoid foods or drinks that cause flare-ups. Eat a well-balanced diet. General instructions If you were prescribed an antibiotic medicine, take it as told by your health care provider. Do not stop taking the antibiotic even if you start to feel better. Take over-the-counter and prescription  medicines only as told by your health care provider. Keep all follow-up visits. This is important. Contact a health care provider if: Your symptoms do not go away. You develop new symptoms. Get help right away if: You have a fever that does not go away with treatment. You develop chills. You have extreme weakness, fainting, or dehydration. You vomit repeatedly. You develop severe pain in your abdomen. You pass bloody or tarry stool. Summary Colitis is a condition in which the colon is inflamed. Colitis can last a short time (be acute), or it may last a long time (become chronic). Treatment for this condition depends on the cause and may include resting the bowel, taking medicines, or having surgery. If you were prescribed an antibiotic medicine, take it as told by your health care provider. Do not stop taking the antibiotic even if you start to feel better. Get help right away if you develop severe pain in your abdomen. Keep all follow-up visits. This is important. This information is not intended to replace advice given to you by your health care provider. Make sure you discuss any questions you have with your health care provider. Document Revised: 04/30/2020 Document Reviewed: 04/30/2020 Elsevier Patient Education  2024 [employer]   Diarrhea, Adult Diarrhea is when you pass loose and sometimes watery poop (stool) often. Diarrhea can make you feel weak and cause you to lose water in your body (get dehydrated). Losing water in your body can cause you to: Feel tired and thirsty. Have a dry mouth. Go pee (urinate) less often. Diarrhea often lasts 2-3 days. It can last longer if it is a sign of something more serious. Be sure  to treat your diarrhea as told by your doctor. Follow these instructions at home: Eating and drinking     Follow these instructions as told by your doctor: Take an ORS (oral rehydration solution). This is a drink that helps you replace fluids and minerals  your body lost. It is sold at pharmacies and stores. Drink enough fluid to keep your pee (urine) pale yellow. Drink fluids such as: Water. You can also get fluids by sucking on ice chips. Diluted fruit juice. Low-calorie sports drinks. Milk. Avoid drinking fluids that have a lot of sugar or caffeine in them. These include soda, energy drinks, and regular sports drinks. Avoid alcohol. Eat bland, easy-to-digest foods in small amounts as you are able. These foods include: Bananas. Applesauce. Rice. Low-fat (lean) meats. Toast. Crackers. Avoid spicy or fatty foods.  Medicines Take over-the-counter and prescription medicines only as told by your doctor. If you were prescribed antibiotics, take them as told by your doctor. Do not stop taking them even if you start to feel better. General instructions  Wash your hands often using soap and water for 20 seconds. If soap and water are not available, use hand sanitizer. Others in your home should wash their hands as well. Wash your hands: After using the toilet or changing a diaper. Before preparing, cooking, or serving food. While caring for a sick person. While visiting someone in a hospital. Rest at home while you get better. Take a warm bath to help with any burning or pain from having diarrhea. Watch your condition for any changes. Contact a doctor if: You have a fever. Your diarrhea gets worse. You have new symptoms. You vomit every time you eat or drink. You feel light-headed, dizzy, or you have a headache. You have muscle cramps. You have signs of losing too much water in your body, such as: Dark pee, very little pee, or no pee. Cracked lips. Dry mouth. Sunken eyes. Sleepiness. Weakness. You have bloody or black poop or poop that looks like tar. You have very bad pain, cramping, or bloating in your belly (abdomen). Your skin feels cold and clammy. You feel confused. Get help right away if: You have chest pain. Your  heart is beating very quickly. You have trouble breathing or you are breathing very quickly. You feel very weak or you faint. These symptoms may be an emergency. Get help right away. Call 911. Do not wait to see if the symptoms will go away. Do not drive yourself to the hospital. This information is not intended to replace advice given to you by your health care provider. Make sure you discuss any questions you have with your health care provider. Document Revised: 02/10/2022 Document Reviewed: 02/10/2022 Elsevier Patient Education  2024 [employer].

## 2023-07-01 ENCOUNTER — Ambulatory Visit (HOSPITAL_BASED_OUTPATIENT_CLINIC_OR_DEPARTMENT_OTHER)
Admission: RE | Admit: 2023-07-01 | Discharge: 2023-07-01 | Disposition: A | Discharge status: Home / Self Care | Payer: Medicare HMO | Source: Ambulatory Visit | Attending: Physician Assistant | Admitting: Physician Assistant

## 2023-07-01 ENCOUNTER — Other Ambulatory Visit: Payer: Self-pay | Admitting: Physician Assistant

## 2023-07-01 DIAGNOSIS — R197 Diarrhea, unspecified: Secondary | ICD-10-CM | POA: Diagnosis not present

## 2023-07-01 DIAGNOSIS — J849 Interstitial pulmonary disease, unspecified: Secondary | ICD-10-CM

## 2023-07-01 DIAGNOSIS — Z8719 Personal history of other diseases of the digestive system: Secondary | ICD-10-CM | POA: Diagnosis not present

## 2023-07-01 DIAGNOSIS — R1032 Left lower quadrant pain: Secondary | ICD-10-CM | POA: Diagnosis not present

## 2023-07-01 DIAGNOSIS — R1084 Generalized abdominal pain: Secondary | ICD-10-CM | POA: Insufficient documentation

## 2023-07-01 DIAGNOSIS — J209 Acute bronchitis, unspecified: Secondary | ICD-10-CM

## 2023-07-01 DIAGNOSIS — J9611 Chronic respiratory failure with hypoxia: Secondary | ICD-10-CM

## 2023-07-01 DIAGNOSIS — Z9049 Acquired absence of other specified parts of digestive tract: Secondary | ICD-10-CM | POA: Diagnosis not present

## 2023-07-01 DIAGNOSIS — K5790 Diverticulosis of intestine, part unspecified, without perforation or abscess without bleeding: Secondary | ICD-10-CM | POA: Diagnosis not present

## 2023-07-01 LAB — CMP14+EGFR
ALT: 28 [IU]/L (ref 0–44)
AST: 26 [IU]/L (ref 0–40)
Albumin: 4.8 g/dL (ref 3.9–4.9)
Alkaline Phosphatase: 70 [IU]/L (ref 44–121)
BUN/Creatinine Ratio: 13 (ref 10–24)
BUN: 14 mg/dL (ref 8–27)
Bilirubin Total: 0.3 mg/dL (ref 0.0–1.2)
CO2: 29 mmol/L (ref 20–29)
Calcium: 9.8 mg/dL (ref 8.6–10.2)
Chloride: 90 mmol/L — ABNORMAL LOW (ref 96–106)
Creatinine, Ser: 1.08 mg/dL (ref 0.76–1.27)
Globulin, Total: 2.3 g/dL (ref 1.5–4.5)
Glucose: 82 mg/dL (ref 70–99)
Potassium: 4.2 mmol/L (ref 3.5–5.2)
Sodium: 133 mmol/L — ABNORMAL LOW (ref 134–144)
Total Protein: 7.1 g/dL (ref 6.0–8.5)
eGFR: 78 mL/min/{1.73_m2} (ref >59)

## 2023-07-01 LAB — LIPASE: Lipase: 23 U/L (ref 13–78)

## 2023-07-01 LAB — CBC WITH DIFFERENTIAL/PLATELET
Basophils Absolute: 0 10*3/uL (ref 0.0–0.2)
Basos: 0 %
EOS (ABSOLUTE): 0.2 10*3/uL (ref 0.0–0.4)
Eos: 3 %
Hematocrit: 37.2 % — ABNORMAL LOW (ref 37.5–51.0)
Hemoglobin: 12.9 g/dL — ABNORMAL LOW (ref 13.0–17.7)
Immature Grans (Abs): 0 10*3/uL (ref 0.0–0.1)
Immature Granulocytes: 0 %
Lymphocytes Absolute: 0.8 10*3/uL (ref 0.7–3.1)
Lymphs: 10 %
MCH: 29.1 pg (ref 26.6–33.0)
MCHC: 34.7 g/dL (ref 31.5–35.7)
MCV: 84 fL (ref 79–97)
Monocytes Absolute: 0.7 10*3/uL (ref 0.1–0.9)
Monocytes: 10 %
Neutrophils Absolute: 5.7 10*3/uL (ref 1.4–7.0)
Neutrophils: 77 %
Platelets: 306 10*3/uL (ref 150–450)
RBC: 4.44 x10E6/uL (ref 4.14–5.80)
RDW: 12.7 % (ref 11.6–15.4)
WBC: 7.4 10*3/uL (ref 3.4–10.8)

## 2023-07-01 MED ORDER — IOHEXOL 300 MG/ML  SOLN
100.0000 mL | Freq: Once | INTRAMUSCULAR | Status: AC | PRN
  Administered 2023-07-01: 100 mL via INTRAVENOUS

## 2023-07-02 ENCOUNTER — Encounter: Payer: Self-pay | Admitting: Physician Assistant

## 2023-07-02 DIAGNOSIS — Z8719 Personal history of other diseases of the digestive system: Secondary | ICD-10-CM | POA: Diagnosis not present

## 2023-07-02 DIAGNOSIS — R1084 Generalized abdominal pain: Secondary | ICD-10-CM | POA: Diagnosis not present

## 2023-07-02 DIAGNOSIS — R197 Diarrhea, unspecified: Secondary | ICD-10-CM | POA: Diagnosis not present

## 2023-07-02 NOTE — Progress Notes (Signed)
No acute finding of diverticulitis or colitis. How are you feeling today?

## 2023-07-02 NOTE — Progress Notes (Signed)
[place],   No WBC elevation to indicate bacterial infection.  Kidney function looks good and better than normal.  Liver function is great.  Sodium low but stable.  Lipase, pancreatic enzyme, is normal.

## 2023-07-05 NOTE — Progress Notes (Signed)
[place],   No abnormal bacteria found in stool.  Negative for C.diff.  Pending ova and parasite testing.

## 2023-07-06 ENCOUNTER — Other Ambulatory Visit: Payer: Self-pay | Admitting: Physician Assistant

## 2023-07-06 DIAGNOSIS — K219 Gastro-esophageal reflux disease without esophagitis: Secondary | ICD-10-CM

## 2023-07-07 NOTE — Progress Notes (Signed)
No ova and parasites found.

## 2023-07-08 LAB — CDIFF NAA+O+P+STOOL CULTURE
E coli, Shiga toxin Assay: NEGATIVE
Toxigenic C. Difficile by PCR: NEGATIVE

## 2023-07-15 ENCOUNTER — Encounter: Payer: Self-pay | Admitting: Internal Medicine

## 2023-07-15 ENCOUNTER — Telehealth: Payer: Self-pay | Admitting: Internal Medicine

## 2023-07-15 ENCOUNTER — Ambulatory Visit: Payer: Medicare HMO | Admitting: Internal Medicine

## 2023-07-15 VITALS — BP 122/78 | HR 98 | Ht 71.0 in | Wt 232.8 lb

## 2023-07-15 DIAGNOSIS — Z23 Encounter for immunization: Secondary | ICD-10-CM | POA: Diagnosis not present

## 2023-07-15 DIAGNOSIS — J849 Interstitial pulmonary disease, unspecified: Secondary | ICD-10-CM | POA: Diagnosis not present

## 2023-07-15 DIAGNOSIS — J9611 Chronic respiratory failure with hypoxia: Secondary | ICD-10-CM

## 2023-07-15 DIAGNOSIS — R000 Tachycardia, unspecified: Secondary | ICD-10-CM

## 2023-07-15 DIAGNOSIS — G4733 Obstructive sleep apnea (adult) (pediatric): Secondary | ICD-10-CM | POA: Diagnosis not present

## 2023-07-15 NOTE — Patient Instructions (Addendum)
Chronic hypoxemic respiratory failure ILD (interstitial lung disease) (HCC) Sinus tachycardia Flu vaccine need   Slowly and steadily getting worse/ progressive but stabile since last visit Too bad you cannot tolerare oral prednisone or esbriet or ofev  -2 but we have not been able to start the schedule weekly Depo-Medrol as planned because of logistical issues Pulse ox 88% Room Air at rest 07/15/2023 There also appears to be resting tachycardia    Plan - shared decision making -I have sent a phone message to your primary care physician assistant to see if 80 mg once a week or Solu-Medrol can be done at the primary care office  -Also for the inappropriate sinus tachycardia  -I have sent a message to Dr. Elberta Fortis to see if ivabradine can be helpful for you  -Flu shot next week on your own  -Continue oxygen therapy to keep your pulse ox goal greater than 88%  -Spirometry and DLCO in 8-10 weeks -    Followup -Return to see  DR Raaswamy in 8-10 weeks; 15-minute visit but after PFT  -Symptom score and pulse ox on room air at rest at follow-up

## 2023-07-15 NOTE — Telephone Encounter (Signed)
Dear [name], [name], PA-C  -So Mr. Jake [name] is my patient.  Return to put him on a Depo-Medrol 80 mg once a week for his ILD as opposed to taking p.o. prednisone daily.  This because of his personal preference and side effect profile.  We are having significant staffing issues in pulmonary clinic to administer this.  Therefore I am reaching out to you to see if you can get this organized for him.  He says also you are closer to his house.  We giving a dose today.  Thank you so much for accommodating this.  It does not have to be exactly on the same day same time every week but it can be 2 days on either side

## 2023-07-15 NOTE — Telephone Encounter (Signed)
Re Jake [name]   Dear Will  Mr. Jake [name] is going to be seeing you soon.  Noticed in the last 2 visits that even at rest he has some tachycardia that is mild.  He does have heart failure as you know.  But he also has pulmonary interstitial lung disease which can be contributing to his tachycardia.  Regardless the tachycardia can be making him symptomatic with the shortness of breath.  1 if he has a role for ivabradine because of inappropriate tachycardia?.  I have seen 1 patient be given this in the setting of pulmonary fibrosis and it did help with his dyspnea  I will leave the decision to you but thought I will bring it up with you Patient will probably be discussing this with you at the visit   Thanks     SIGNATURE    Dr. Kalman Shan, M.D., F.C.C.P,  Pulmonary and Critical Care Medicine Staff Physician, [facility] Director - Interstitial Lung Disease  Program  Pulmonary Fibrosis [facility] at [facility] [place], [place], [address]   Pager: [phone], If no answer  -> Check AMION or Try [phone] Telephone (clinical office): [phone] Telephone (research): [phone]  4:06 PM 07/15/2023

## 2023-07-15 NOTE — Progress Notes (Signed)
OV 02/03/2022 -transfer of care to Dr. Marchelle Gearing at the ILD clinic by Dr. Val Eagle  Subjective:  Patient ID: Jake [name], male , DOB: 1961-07-16 , age 62 y.o. , MRN: [id number] , ADDRESS: [address] [place] [address] PCP Jomarie Longs, PA-C Patient Care Team: Nolene Ebbs as PCP - General (Family Medicine) Thomasene Ripple, DO as PCP - Cardiology (Cardiology) Fran Lowes, PA-C as Referring Physician (Pulmonary Disease) Gabriel Carina, [facility] as Pharmacist (Pharmacist)  This Provider for this visit: Treatment Team:  Attending Provider: Kalman Shan, MD    02/03/2022 -   Chief Complaint  Patient presents with   Follow-up    Pt switching from Dr. Wynona Neat to MR for ILD eval. Pt states lately he has had problems of SOB that can happen at any time and also has a chronic cough.     HPI Jake [name] 62 y.o. -history is provided by the wife, patient and also reviewed the medical records and the ILD questionnaire.  He has had insidious onset of shortness of breath for the last few years.  Chart review it appears that in 2020 when he got his respiratory infection [in retrospect he thinks this was COVID before the formal declaration of the pandemic] and since then he has been having progressive shortness of breath.  Especially over the last year or so his progression is worse..  For the last 1 year has been on oxygen 2 L.  He feels he haseven progressed more in the last few months.  And is now needing oxygen 4 L at rest at all the time.  Sometimes it is 3 L.  Wife says he easily desaturates into the 4s and occasionally into the 50s when he exerts.  When he does the bed or showers or does chores increases to 4 L.  He feels a portable oxygen is not helping him anymore.  Particularly more progressive in the last few months.  Recently was tried on prednisone.  He is not sure if it helped but it made him very angry had irate emotional outburst and he could not take prednisone  anymore.  Through all this late last year in 2022 he was off his Entresto for heart failure.  This made the shortness of breath worse but then he went back on it in January and he got better but nevertheless overall progressive dyspnea.     [place] Integrated Comprehensive ILD Questionnaire  Symptoms:     Past Medical History :  Status post CABG in 2004.  He also had cardiac stents approximately 6-12 months ago according to his history with 1 repair of the stent.  Still it did not help the shortness of breath.  He has had COVID at least 3 times.  The first 1 was in January 2020 right before the onset of the pandemic.  The second 1 was in January 2020 and the third was in December 2022 during small bowel obstruction admission.  This was an incidental finding according to the wife  He has chronic systolic heart failure  Other issues include nausea, irritable bowel syndrome, acid reflux back and neck pain  He has obstructive sleep apnea  He has a history of pneumonia several years ago  ROS:  -Positive for fatigue for the last several months, arthralgia for the last several years, dysphagia for the last several decades, dry eyes for the last several months, nausea for the last several years, acid reflux for the  last several years also snoring for the last several years  FAMILY HISTORY of LUNG DISEASE:  Negative for any lung disease  PERSONAL EXPOSURE HISTORY:  -Did smoke in the remote past and quit -Smoked between 1985 and 2008 1 and half packs per day. -Possible cocaine use between 1991 and marijuana use between 1978 and 2008 ": Very little to none   HOME  EXPOSURE and HOBBY DETAILS :  -20 years ago he lived in a house that had black mold he lived there for few years but has not been exposed to that for 20 years.  -Was using a Phillips responding CPAP machine for a few years.  It was subject to recall because of foam silicone  contamination issues that caused respiratory  complications.  He stopped using this in 2021 shortly after the recall was issued.  -He is now living in his daughter's house for the last 3 months.  -The old house did have mold.  It also had mildew in the shower curtain. 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  Restrictive pattern with mild desaturations.  -Echocardiogram March 2022 ` -Ejection fraction 30-35%, grade 1 diastolic dysfunction  - BNP May 2023  - 31  - RHC June 2023  -  Nelva Bush RHC   Echo 02/12/22: EF 40-45%, mild MR. LA moderately dilated. RV size and function normal. GRade 1 DD. RHC 02/12/22: RA- 5 mmHg; PA 25/12 mmHg, mean PAP 17 mmHg, Wedge 8 mmHg, CO (fick) 6.59 L/mim, CI-2.96 L/min meter2.   03/12/2022: Today - follow up Patient presents today with wife after being started on Ofev. He is having significant nausea with his first dose of Ofev. He wakes up in the morning, takes his zofran and eats a small snack. He then takes his Ofev at 8 am with food as well, and has tried multiple different food options. He then becomes extremely nauseated around 845 and has to go lay down until he is able to take his second dose of Zofran around 1/2 pm. He finally begins to feel better and is able to tolerate his second dose. He is understandably frustrated by this  and does not wish to have this poor quality of life from his medications. He denies any vomiting or diarrhea. He has had some weight gain as he has had to eat so often with the Ofev to try to curve his nausea.   From a respiratory standpoint, he continues to get winded with minimal exertion. He can be changing the bed at home or walk 50 ft to the mailbox and has to sit down afterwards to rest and catch his breath. He has also had drops in his oxygen into the 70's-80's despite his supplemental 4 lpm so he has been minimally active because of this. No significant cough or wheezing. He has not noticed any difference in his  activity tolerance or lung function since starting the Ofev, but has felt so terrible that he's not sure if he would notice much.           OV 05/05/2022  Subjective:  Patient ID: Jake [name], male , DOB: Jan 18, 1961 , age 24 y.o. , MRN: [id number] , ADDRESS: [address] [place] [address] PCP Jomarie Longs, PA-C Patient Care Team: Nolene Ebbs as PCP - General (Family Medicine) Thomasene Ripple, DO as PCP - Cardiology (Cardiology) Fran Lowes, PA-C as Referring Physician (Pulmonary Disease) Gabriel Carina, [facility] as Pharmacist (Pharmacist)  This Provider for this visit: Treatment Team:  Attending Provider: Kalman Shan, MD    05/05/2022 -   Chief Complaint  Patient presents with   Follow-up    Pt states he feels like his breathing has become worse since last visit. Also states he has had a dry cough, chest discomfort, and has been having GI issues due to the Esbriet.     HPI Jake [name] 62 y.o. --presents for follow-up.  Presents with his wife.  He has non-- IPF progressive phenotype ILD.  Suspected etiology usage of CPAP machine [employer].  He tried nintedanib but he had significant intolerance.  He is currently on fourth week of pirfenidone at full dose for the last 1 week.  He is having significant diarrhea nausea and  fatigue because of this.  At 2 pills 3 times daily it was more tolerable.  He is willing to go back to 2 pills 3 times daily and try.  He also significant anxiety and depression.  He is on Zoloft.  In the past he has tried SNRI or some other agent according to the wife but he had side effects.  I have recommended they talk with the primary care physician about this.  He is also trying to lose weight.  He has been prescribed Wegovy but there is no stock available.  He has been looked at [facility] for lung transplant but given his low EF he has been disqualified and they do not want to see him.  He did have a right heart catheterization in June 2023 and this was normal.  According to him after some delay from our office he has been referred to Drinda Butts at [facility] for lung transplant evaluation.  But he has not heard back from them.  I personally emailed Dr. Lendon Collar office.  Also he tells me that with 4 L nasal cannula at rest he is fine but when he does any laundry he desaturates into the 80s.  He has tried increasing to 5 L nasal cannula.  I advised that he could increase it further and keep his pulse ox over 86% and monitor it.  He is willing to do that.  They are not interested in immunomodulators right now because of young kids in the house.    Overall he and his wife are worried that he is declining.  He did have pulmonary function test today and?  Slight decline compared to May 2023.[name] Kitchen  He had liver function test monitoring blood work yesterday but so far the results are not back yet.   SUMMARY VISIT Dr Harrold Donath 06/09/22 at [facility]   He also agrees that patient does not have IPF.  CT scan is not consistent with UIP.  Currently pirfenidone is causing too many side effects.  He wants to stop it.  Because there is more suggestion of valvulitis he wants to  start CellCept.  He is going to start the patient on 500 mg twice daily today.  Patient seeing me in approximately a month from now.  At  the time I will increase to a total of 1 g twice daily.   He also wants the patient referred to pulmonary rehabilitation.   In terms of lung transplantation a lot of relative contraindications including weight and mild cardiomyopathy and intolerance to steroids.  At this point in time he wants patient to be more active and get into rehab  Of note patient only desaturated to 88% on room air and that walk test today   Plan  - Get patient in for CBC chemistry and liver function test in 2-3 weeks from today  -Refer to pulmonary rehabilitation   OV 07/20/2022  Subjective:  Patient ID: Jake [name], male , DOB: Oct 31, 1960 , age 82 y.o. , MRN: [id number] , ADDRESS: [address] [place] [address] PCP Jomarie Longs, PA-C Patient Care Team: Nolene Ebbs as PCP - General (Family Medicine) Thomasene Ripple, DO as PCP - Cardiology (Cardiology) Fran Lowes, PA-C as Referring Physician (Pulmonary Disease) Gabriel Carina, [facility] as Pharmacist (Pharmacist)  This Provider for this visit: Treatment Team:  Attending Provider: Kalman Shan, MD    07/20/2022 -   Chief Complaint  Patient presents with   Follow-up    Pt states he has been doing okay since last visit. Has not worn his O2 in about a month and states that he has been walking about 0.73mile a day.     HPI Jake [name] 62 y.o. -returns for follow-up with his wife.  Since his last visit he has lost over 10 pounds because of Ozempic.  He started his Ozempic.  Then approximately a month ago he did see Dr. Marcie Bal at [facility].  At the time his exercise hypoxemia improved.  He started beginning to feel better as well.  According to the wife and him a day prior to that he was still feeling miserable but on the day of the visit he started noticing a turnaround and since then he has had sustained turnaround.  He was having side effects from pirfenidone and this was stopped early October 2023.  Dr.  Harrold Donath recommended mycophenolate mofetil [patient mistakenly thought this was metoprolol at this visit but later confirmed that he has been on metoprolol for a long time to Dr. Jens Som for his heart failure].  Since then he is having sustained improvement in dyspnea.  Also he was able to go to [employer] and walk on flat ground for a long time without desaturating.  He still desaturates but it is only for heavy exertion such as vacuuming or lifting heavy load.  He will go into the low 80s and then rest to recover.  He does not use his oxygen for his hypoxemia.  Instead he opted to rest.  At this point in time he is telling me because of his improvement he does not want to do his mycophenolate Reather Littler is going to confirm it was indeed mycophenolate and not metoprolol].  I referred him to pulm rehabilitation but this was expensive.  He is opting to exercise by himself and continue weight loss with Ozempic.  Also because of his improvement he is asking if he can do a pulmonary function test.  His walking desaturation test is improved.  He also his pulmonary function test shows improvement although he still worse compared to June of  last year.  His last echocardiogram was in the summer 2023 and he still had low ejection fraction.  His BNP at that time was normal.  In terms of lung transplant Dr. Harrold Donath is told him it is too high risk given his medication side effects and heart failure and obesity.  At this point in time he is reconciled to the fact that he will not be a transplant candidate.  However he is overall pleased that he is improved.         OV 10/29/2022  Subjective:  Patient ID: Jake [name], male , DOB: [birth year] , age 36 y.o. , MRN: [id number] , ADDRESS: [address] [place] [address] PCP Jomarie Longs, PA-C Patient Care Team: Nolene Ebbs as PCP - General (Family Medicine) Thomasene Ripple, DO as PCP - Cardiology (Cardiology) Fran Lowes, PA-C as  Referring Physician (Pulmonary Disease) Gabriel Carina, [facility] as Pharmacist (Pharmacist)  This Provider for this visit: Treatment Team:  Attending Provider: Kalman Shan, MD    10/29/2022 -   Chief Complaint  Patient presents with   Follow-up    Cleda Daub review,     HPI Jake [name] 62 y.o. - Returns for follow-up with his wife.  Overall he reports being stable.  Weight is stable.  He is no longer on antifibrotic's because of intolerance.  His ejection fraction has dropped to 30% and he is frustrated by that.  Wife states that he does desaturate when he exerts and then he rests and the oxygen goes up.  She would prefer for him to wear the oxygen preemptively.  I encouraged him to do so because that is a better strategy.  At this point in time he states that he had a rough flu season he had stomach flu twice.  He believes he might have "once in 2 or 3 other respiratory infections again denies any mold or mildew or bird feathers in the house he does do housework but has to wear oxygen for that he can go shopping without oxygen but wife suspects he might be desaturating.  We did discuss clinical trials as a care option.  Presented to have inhaled pirfenidone trial that is yet to start and also inhaled treprostinil trial that is ongoing.  They are interested in this.  Discussed the following care concept about clinical trials.  Will reassess for this and second quarter or third quarter 2024.  He did have a CT scan of the chest Jan 2024 I reviewed the result.  ILD is described but there is no other abnormality described.  Since CT scan with contrast so we cannot compare the CT scans.     OV 01/05/2023  Subjective:  Patient ID: Jake [name], male , DOB: 08-Feb-1961 , age 89 y.o. , MRN: [id number] , ADDRESS: [address] [place] [address] PCP Jomarie Longs, PA-C Patient Care Team: Nolene Ebbs as PCP - General (Family Medicine) Jens Som Madolyn Frieze, MD as PCP - Cardiology  (Cardiology) Fran Lowes, PA-C as Referring Physician (Pulmonary Disease) Gabriel Carina, [facility] as Pharmacist (Pharmacist)  This Provider for this visit: Treatment Team:  Attending Provider: Kalman Shan, MD    01/05/2023 -   Chief Complaint  Patient presents with   Follow-up    F/up after heart attack    HPI Jake [name] 62 y.o. - presents with wife. On approx 11/16/22 felt unweasy with chest burn. Admitted at [facility] and dx with MI with trop leak.  Had DES. Dsichargd on Brilintia and metoprolol but both gav him side efffects of dyspnea, anxiety, arthralgia. So both stopped and now on plavix and repatha. Toleraitng it ok but for mild fatigues. He came in today to updated me. Resp wise he feels stable. Can hold pulse ox > 88% at rest but with exertun desaturates. Wife is heer with him and is indeoendenbt historian - says he is not promot about wearing his o2. Currenty dealing with allergies. Used wifes; Azihro Wondered if I could fill his azithro but his baseline 2024 and 2023 EKG bith show QTc > 500 msec       OV 02/16/2023  Subjective:  Patient ID: Jake [name], male , DOB: [birth year] , age 48 y.o. , MRN: [id number] , ADDRESS: [address] [place] [address] PCP Jomarie Longs, PA-C Patient Care Team: Nolene Ebbs as PCP - General (Family Medicine) Jens Som Madolyn Frieze, MD as PCP - Cardiology (Cardiology) Fran Lowes, PA-C as Referring Physician (Pulmonary Disease) Gabriel Carina, [facility] as Pharmacist (Pharmacist)  This Provider for this visit: Treatment Team:  Attending Provider: Kalman Shan, MD  02/16/2023 -   Chief Complaint  Patient presents with   Acute Visit    Cough, chest tightness, increasing sob when not on oxygen.     HPI Jake [name] 62 y.o. - ACUTE VISIT3L.  Presents with his wife Wayden Schwertner.  She is an independent historian.  She corroborates everything that he is saying.  Around February 10, 2023 Wednesday started having  slightly more cough.  Then on February 12, 2023 Friday he went to an outdoor concert in [place].  They started coughing more.  Then after he came back he started feeling and this is since February 13, 2023 tired with chest tightness coughing.  Also for the last 1 week his oxygen is dropping.  Previously could take a shower without desaturating now he drops into the 70s.  He says his pulse ox at rest is in the 80s.  Currently pulse ox is 98% on 3 L at rest.  There is no edema but there is orthopnea.  There is no hemoptysis.  There is no wheezing no fever no diarrhea.  Has baseline nausea and it has not changed.  He feels his chest is extremely tight.  There might be some wheezing 2.  The cough is dry but 1 time he did have 1 nickel sized mucus but that is just the 1 time in many days.  He is really worried about his condition.  He does not feel it similar to his previous heart attack.  His wife is wondering about getting a CT chest last CT chest was a year ago.   He does not want steroids.  Especially oral.  He says he is fine with IM steroids.      OV 05/13/2023  Subjective:  Patient ID: Jake [name], male , DOB: 1960/12/14 , age 2 y.o. , MRN: [id number] , ADDRESS: [address] [place] [address] PCP Jomarie Longs, PA-C Patient Care Team: Nolene Ebbs as PCP - General (Family Medicine) Jens Som Madolyn Frieze, MD as PCP - Cardiology (Cardiology) Regan Lemming, MD as PCP - Electrophysiology (Cardiology) Fran Lowes, PA-C as Referring Physician (Pulmonary Disease) Gabriel Carina, [facility] as Pharmacist (Pharmacist)  This Provider for this visit: Treatment Team:  Attending Provider: Kalman Shan, MD   05/13/2023 -   Chief Complaint  Patient presents with   Follow-up  O2 2-3 l       HPI Jake [name] 62 y.o. -presents for follow-up.  Presents with his wife who is an independent historian.  He tells me that the IM Depo-Medrol I gave him helped him.  He cannot do  oral prednisone because it changes his mood.  He states his mood gets so bad that even I would not like to know him.  He feels that after initial improvement from the acute flareup he started feeling worse again.  He feels he is chronically getting worse on 04/14/2023 he had ICD and pacemaker implantation but despite that he is getting worse.  Symptom score shows significant decline.  Concomitant with that high-resolution CT chest that I reviewed visualized and showed it to him and his wife shows worsening ILD.  Pulmonary function test also shows a decline.  Although the sit/stand exercise hypoxemia test appears to be the same.  He reports it is getting very difficult for him to go to the bathroom.  We had conversations about his life expectancy which could still be few to several years with a lot of variability.  We discussed treatment options.  He cannot tolerate the antifibrotic's pirfenidone or nintedanib.  He does not want to do oral prednisone.  We discussed CellCept but he is worried about immunosuppression.  He lives with his grandkids and he adores them.  He says they come with respiratory infections therefore he does not want to do this at this point he wants to do IM Depo-Medrol on a scheduled basis.  I have reached out to our pharmacy team he is also willing to try IV Solu-Medrol.  We discussed clinical trials as a care option.  He is already in the ILD-Pro registry.  He will have a research visit today.  There is inhaled pirfenidone and treprostinil studies.  We will prescreen this    CT Chest data from date: HRCT 02/25/23  - personally visualized and independently interpreted : yes - my findings are: as below Narrative & Impression  CLINICAL DATA:  62 year old male with history of prior myocardial infarction. Evaluate for interstitial lung disease.   EXAM: CT CHEST WITHOUT CONTRAST   TECHNIQUE: Multidetector CT imaging of the chest was performed following the standard protocol without  intravenous contrast. High resolution imaging of the lungs, as well as inspiratory and expiratory imaging, was performed.   RADIATION DOSE REDUCTION: This exam was performed according to the departmental dose-optimization program which includes automated exposure control, adjustment of the mA and/or kV according to patient size and/or use of iterative reconstruction technique.   COMPARISON:  Multiple priors, including prior high-resolution chest CT 01/16/2022.   FINDINGS: Cardiovascular: Heart size is normal. There is no significant pericardial fluid, thickening or pericardial calcification. There is aortic atherosclerosis, as well as atherosclerosis of the great vessels of the mediastinum and the coronary arteries, including calcified atherosclerotic plaque in the left anterior descending and right coronary arteries. Calcifications of the aortic valve.   Mediastinum/Nodes: No pathologically enlarged mediastinal or hilar lymph nodes. Please note that accurate exclusion of hilar adenopathy is limited on noncontrast CT scans. Esophagus is unremarkable in appearance. No axillary lymphadenopathy.   Lungs/Pleura: High-resolution images again demonstrate patchy areas of ground-glass attenuation, septal thickening, subpleural reticulation, mild cylindrical bronchiectasis and peripheral bronchiolectasis scattered throughout the lungs bilaterally. There is no discernible craniocaudal gradient to these findings. No frank honeycombing. Findings appear minimally progressive compared to the prior study from 01/16/2022. Inspiratory and expiratory imaging demonstrates mild air trapping  indicative of small airways disease. No acute consolidative airspace disease. No pleural effusions. No definite suspicious appearing pulmonary nodules or masses are noted.   Upper Abdomen: Atherosclerotic calcifications in the abdominal aorta. Status post cholecystectomy.   Musculoskeletal: Orthopedic fixation  hardware in the lower cervical spine incidentally noted. There are no aggressive appearing lytic or blastic lesions noted in the visualized portions of the skeleton.   IMPRESSION: 1. The appearance of the lungs remains compatible with interstitial lung disease, with a spectrum of findings considered most compatible with an alternative diagnosis (not usual interstitial pneumonia) per current ATS guidelines. 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 2. Aortic atherosclerosis, in addition to two-vessel coronary artery disease. Please note that although the presence of coronary artery calcium documents the presence of coronary artery disease, the severity of this disease and any potential stenosis cannot be assessed on this non-gated CT examination. Assessment for potential risk factor modification, dietary therapy or pharmacologic therapy may be warranted, if clinically indicated. 3. There are calcifications of the aortic valve. Echocardiographic correlation for evaluation of potential valvular dysfunction may be warranted if clinically indicated.   Aortic Atherosclerosis (ICD10-I70.0).     Electronically Signed   By: Trudie Reed M.D.   On: 03/01/2023 12:31     OV 07/15/2023  Subjective:  Patient ID: Jake [name], male , DOB: [birth year] , age 81 y.o. , MRN: [id number] , ADDRESS: 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 but also exposure to a [employer] devic Pattern does NOT 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  Echo June 2024 with EF 25%  Prolonged QTc   07/15/2023 -   Chief Complaint  Patient presents with   Follow-up    Pt states steroid shot has not been scheduled. Pt states breathing still the same      HPI Jake [name] 62 y.o. -presents for follow-up.  Presents with his wife.  She is an independent historian.  Last visit we decided to try IM Depo-Medrol on a weekly basis to try to control inflammation as long because his progressive phenotype and his ILD.  He cannot tolerate antifibrotic's.  However we run into logistical issues.  It took a while to figure out the right dosing and the schedule with the pharmacy team.  But right now we have shortage of support staff.  Therefore we resolved today that I would reach out to his primary care physician assistant to see if it can be done there.  That office  is close to his home anyways.  I did advise him that it may not be exactly on the same day every week it can be plus a -2 days.  Nonetheless from a symptom standpoint he is doing stable although early in the morning he does feel more short of breath when he exerts he is more short of breath he is using his oxygen day and [name].  Right now at rest it is 88% on room air.  He did get IM Depo-Medrol for a stomach virus a few weeks ago and it did help him  according to his history.  Of note I did notice that he was tachycardic today at rest and review of the records indicate that he said some send tachycardia event at the last visit.  I did mention to him the role of ivabradine and inappropriate sinus tachycardia and heart failure patients.  Did say that 1 patient who got ivabradine in the setting of normal ejection fraction and inappropriate sinus tachycardia did feel better.  This patient had ILD.  I have reached out to Dr. Elberta Fortis to see if this would be an appropriate therapy for the patient.     SYMPTOM SCALE - ILD 02/03/2022 07/20/2022 Off esbreit and ofev 10/29/22 Ef now 30-35% 05/13/2023 Ef 25$ 07/15/2023 Ef 25$  Current weight 231# 224# - ozempic 229#    O2 use 4L Oto rest  Ra rest    Shortness of Breath 0 -> 5 scale with 5 being worst (score 6 If unable to do)      At rest 2 0 2 2 3   Simple tasks - showers, clothes change, eating, shaving 5 2 3 4 4   Household (dishes, doing bed, laundry) 5 4 5 5 5   Shopping 5 2 1 4 4   Walking level at own pace 4 3 2 4 4   Walking up Stairs 5 4 4 5 5   Total (30-36) Dyspnea Score 26 15 17 24 25       Non-dyspnea symptoms (0-> 5 scale) 02/03/2022 07/20/2022  10/29/2022  05/13/2023  07/15/2023   How bad is your cough? 4 3 1 4 4   How bad is your fatigue 5 2 3 5 5   How bad is nausea 4 5 4 4 4   How bad is vomiting?  0 0 0 0 0  How bad is diarrhea? 0 1 0 0 2  How bad is anxiety? 5 1 2 5 4   How bad is depression 5 1 2 2 4   Any chronic pain - if so where and how bad 3 x   x     Simple office walk 185 feet x  3 laps goal with forehead probe 07/20/2022  01/05/2023  02/16/2023  05/13/2023  07/15/2023   O2 used ra ra 3L ra ra  Number laps completed 3 5 sit stand  5 sit stand rest  Comments about pace avg      Resting Pulse Ox/HR 99% and 85/min 94% 98% at rest 97% and HR 95 88% Room air res -, HR 108  Final Pulse Ox/HR 93% and 100/min 89%  88% befiefly a minute later   Desaturated </= 88% no       Desaturated <= 3% points Yes 6       Got Tachycardic >/= 90/min yes      Symptoms at end of test Mild dyspea      Miscellaneous comments improved         PFT  Latest Ref Rng & Units 05/11/2023    2:56 PM 10/29/2022   12:49 PM 07/02/2022   11:35 AM 05/04/2022    3:32 PM 01/15/2022    3:58 PM 02/26/2021   12:00 PM  ILD indicators  FVC-Pre L 1.93   2.20  1.87  1.91  2.44   FVC-Predicted Pre % 39  41  44  38  38  49   FVC-Post L     1.97  2.35   FVC-Predicted Post %     40  47   TLC L 8.29     4.36  4.52   TLC Predicted % 115     60  62   DLCO uncorrected ml/min/mmHg 8.40  12.55  12.27  11.35  14.05  16.66   DLCO UNC %Pred % 29  44  43  40  49  58   DLCO Corrected ml/min/mmHg   12.27  11.93  14.05  16.66   DLCO COR %Pred %   43  42  49  58       LAB RESULTS last 96 hours No results found.  LAB RESULTS last 90 days Recent Results (from the past 2160 hour(s))  CUP PACEART INCLINIC DEVICE CHECK     Status: None   Collection Time: 04/28/23 12:19 PM  Result Value Ref Range   Pulse Generator Manufacturer MERM    Date Time Interrogation Session [id number]    Pulse Gen Model [id number] Cobalt XT HF CRT-D MRI    Pulse Gen Serial Number [id number]    Clinic [facility]    Implantable Pulse Generator Type Cardiac Resynch Therapy Defibulator    Implantable Pulse Generator Implant Date [id number]    Implantable Lead Manufacturer [facility]    Implantable Lead Model 4598 Attain Performa S    Implantable Lead Serial Number [id number]    Implantable Lead Implant Date [id number]    Implantable Lead Location Detail 1 UNKNOWN    Implantable Lead Location [id number]    Implantable Lead Connection Status [id number]    Implantable Lead Manufacturer MERM    Implantable Lead Model [id number] Sprint Quattro Secure S    Implantable Lead Serial Number [id number]    Implantable Lead Implant Date [id number]    Implantable Lead Location Detail 1 UNKNOWN    Implantable Lead Location [id number]    Implantable  Lead Connection Status [id number]    Implantable Lead Manufacturer [facility]    Implantable Lead Model [id number] Sprint Quattro Secure S    Implantable Lead Serial Number [id number]    Implantable Lead Implant Date [id number]    Implantable Lead Location Detail 1 UNKNOWN    Implantable Lead Location [id number]    Implantable Lead Connection Status [id number]    Eval Rhythm AS/VS 77   Pulmonary function test     Status: None   Collection Time: 05/11/23  2:56 PM  Result Value Ref Range   FVC-Pre 1.93 L   FVC-%Pred-Pre 39 %   FEV1-Pre 1.75 L   FEV1-%Pred-Pre 47 %   FEV6-Pre 1.93 L   FEV6-%Pred-Pre 41 %   Pre FEV1/FVC ratio 91 %   FEV1FVC-%Pred-Pre 120 %   Pre FEV6/FVC Ratio 100 %   FEV6FVC-%Pred-Pre 104 %   FEF 25-75 Pre 4.01 L/sec   FEF2575-%Pred-Pre 135 %   RV 6.38 L   RV % pred 272 %   TLC 8.29 L   TLC % pred 115 %   DLCO unc 8.40 ml/min/mmHg   DLCO unc % pred 29 %  DL/VA 1.61 ml/min/mmHg/L   DL/VA % pred 67 %  CBC w/Diff/Platelet     Status: Abnormal   Collection Time: 06/30/23 11:58 AM  Result Value Ref Range   WBC 7.4 3.4 - 10.8 x10E3/uL   RBC 4.44 4.14 - 5.80 x10E6/uL   Hemoglobin 12.9 (L) 13.0 - 17.7 g/dL   Hematocrit 09.6 (L) 04.5 - 51.0 %   MCV 84 79 - 97 fL   MCH 29.1 26.6 - 33.0 pg   MCHC 34.7 31.5 - 35.7 g/dL   RDW 40.9 81.1 - 91.4 %   Platelets 306 150 - 450 x10E3/uL   Neutrophils 77 Not Estab. %   Lymphs 10 Not Estab. %   Monocytes 10 Not Estab. %   Eos 3 Not Estab. %   Basos 0 Not Estab. %   Neutrophils Absolute 5.7 1.4 - 7.0 x10E3/uL   Lymphocytes Absolute 0.8 0.7 - 3.1 x10E3/uL   Monocytes Absolute 0.7 0.1 - 0.9 x10E3/uL   EOS (ABSOLUTE) 0.2 0.0 - 0.4 x10E3/uL   Basophils Absolute 0.0 0.0 - 0.2 x10E3/uL   Immature Granulocytes 0 Not Estab. %   Immature Grans (Abs) 0.0 0.0 - 0.1 x10E3/uL  CMP14+EGFR     Status: Abnormal   Collection Time: 06/30/23 11:58 AM  Result Value Ref Range   Glucose 82 70 - 99 mg/dL   BUN 14 8 - 27 mg/dL   Creatinine, Ser 7.82 0.76 - 1.27  mg/dL   eGFR 78 >95 AO/ZHY/8.65   BUN/Creatinine Ratio 13 10 - 24   Sodium 133 (L) 134 - 144 mmol/L   Potassium 4.2 3.5 - 5.2 mmol/L   Chloride 90 (L) 96 - 106 mmol/L   CO2 29 20 - 29 mmol/L   Calcium 9.8 8.6 - 10.2 mg/dL   Total Protein 7.1 6.0 - 8.5 g/dL   Albumin 4.8 3.9 - 4.9 g/dL   Globulin, Total 2.3 1.5 - 4.5 g/dL   Bilirubin Total 0.3 0.0 - 1.2 mg/dL   Alkaline Phosphatase 70 44 - 121 IU/L   AST 26 0 - 40 IU/L   ALT 28 0 - 44 IU/L  Lipase     Status: None   Collection Time: 06/30/23 11:58 AM  Result Value Ref Range   Lipase 23 13 - 78 U/L  Cdiff NAA+O+P+Stool Culture     Status: None   Collection Time: 07/02/23  2:00 AM   Specimen: Stool   ST  Result Value Ref Range   Salmonella/Shigella Screen Final report    Stool Culture result 1 (RSASHR) Comment     Comment: No Salmonella or Shigella recovered.   Campylobacter Culture Final report    Stool Culture result 1 (CMPCXR) Comment     Comment: No Campylobacter species isolated.   E coli, Shiga toxin Assay Negative Negative   OVA + PARASITE EXAM Final report     Comment: These results were obtained using wet preparation(s) and trichrome stained smear. This test does not include testing for Cryptosporidium parvum, Cyclospora, or Microsporidia.    O&P result 1 Comment     Comment: No ova, cysts, or parasites seen. One negative specimen does not rule out the possibility of a parasitic infection.    Toxigenic C. 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   reports that he quit smoking about 14 years ago.  His smoking use included cigarettes. He started smoking about 42 years ago. He has a 28 pack-year smoking history. He has never used smokeless tobacco.  Past Surgical History:  Procedure Laterality Date   ANKLE FRACTURE SURGERY  Left    APPENDECTOMY     BIOPSY  11/18/2021   Procedure: BIOPSY;  Surgeon: Shellia Cleverly, DO;  Location: [facility];  Service: Gastroenterology;;  EGD and COLON   BIV ICD INSERTION CRT-D N/A 04/14/2023   Procedure: BIV ICD INSERTION CRT-D;  Surgeon: Regan Lemming, MD;  Location: [facility] INVASIVE CV LAB;  Service: Cardiovascular;  Laterality: N/A;   CERVICAL FUSION     CHOLECYSTECTOMY     COLON SURGERY     COLONOSCOPY WITH PROPOFOL N/A 11/18/2021   Procedure: COLONOSCOPY WITH PROPOFOL;  Surgeon: Shellia Cleverly, DO;  Location: [facility];  Service: Gastroenterology;  Laterality: N/A;   ESOPHAGOGASTRODUODENOSCOPY (EGD) WITH PROPOFOL N/A 11/18/2021   Procedure: ESOPHAGOGASTRODUODENOSCOPY (EGD) WITH PROPOFOL;  Surgeon: Shellia Cleverly, DO;  Location: [facility];  Service: Gastroenterology;  Laterality: N/A;   HERNIA REPAIR     POLYPECTOMY  11/18/2021   Procedure: POLYPECTOMY;  Surgeon: Shellia Cleverly, DO;  Location: [facility];  Service: Gastroenterology;;   RIGHT HEART CATH N/A 02/13/2022   Procedure: RIGHT HEART CATH;  Surgeon: Kathleene Hazel, MD;  Location: [facility] INVASIVE CV LAB;  Service: Cardiovascular;  Laterality: N/A;    Allergies  Allergen Reactions   Fentanyl Other (See Comments)    Avoid opiates Other reaction(s): Other Avoid opiates   Other     04/06/18 Patient is a recovering narcotic abuser and, per wife, is NEVER to receive controlled substances (e.g. Narcotics, sedatives, etc.) Other reaction(s): Unknown 04/06/18 Patient is a recovering narcotic abuser and, per wife, is NEVER to receive controlled substances (e.g. Narcotics, sedatives, etc.)   Aspirin Nausea And Vomiting   Lipitor [Atorvastatin] Nausea And Vomiting    GI side effects.     Prednisone Other (See Comments)     Agitation Severe anger outbursts   Gabapentin     Excessive somnolence, unsteadiness, falls   Ofev [Nintedanib]     GI side effects   Pirfenidone    Contrast Media  [Iodinated Contrast Media] Palpitations    Patient states he tolerates now   Nitroglycerin Nausea And Vomiting    Can take with Nausea Meditcation    Immunization History  Administered Date(s) Administered   Influenza Inj Mdck Quad Pf 06/18/2022   Influenza Split 07/16/2014   Influenza,inj,Quad PF,6+ Mos 07/16/2014, 07/21/2017, 08/22/2018, 06/13/2019, 06/10/2021   Influenza-Unspecified 07/16/2014, 06/14/2016   PFIZER Comirnaty(Gray Top)Covid-19 Tri-Sucrose Vaccine 06/18/2022   PFIZER(Purple Top)SARS-COV-2 Vaccination 11/21/2019, 12/24/2019, 06/23/2020   Respiratory Syncytial Virus Vaccine,Recomb Aduvanted(Arexvy) 07/23/2022   Td 04/15/2015   Td (Adult), 2 Lf Tetanus Toxid, Preservative Free 04/15/2015   Td (Adult),5 Lf Tetanus Toxid, Preservative Free 04/15/2015   Tdap 10/10/2009   Zoster Recombinant(Shingrix) 06/10/2021    Family History  Problem Relation Age of Onset   Cancer Mother        breast   Depression Mother    Hyperlipidemia Mother    Dementia Mother    Alcohol abuse Father    Cancer Father        skin   Hyperlipidemia Father    Alzheimer's disease Father    Cancer Maternal Grandfather    Cancer Paternal Grandmother    Pancreatic cancer Paternal Uncle    Colon cancer Neg Hx    Esophageal cancer Neg Hx  Stomach cancer Neg Hx      Current Outpatient Medications:    albuterol (VENTOLIN HFA) 108 (90 Base) MCG/ACT inhaler, INHALE 2 PUFFS BY MOUTH EVERY 6 HOURS AS NEEDED, Disp: 18 each, Rfl: 3   AMBULATORY NON FORMULARY MEDICATION, Continuous positive airway pressure (CPAP) machine set at autotitration of H2O pressure, with all supplemental supplies as needed., Disp: 1 each, Rfl: 0   aspirin EC 81 MG tablet, Take 81 mg by mouth daily. Swallow whole., Disp: , Rfl:    busPIRone (BUSPAR) 15 MG tablet, Take 1 tablet (15 mg total) by mouth 2 (two) times daily., Disp: 180 tablet, Rfl: 1   chlorthalidone (HYGROTON) 25 MG tablet, TAKE 1 TABLET BY MOUTH EVERY DAY, Disp:  90 tablet, Rfl: 1   clopidogrel (PLAVIX) 75 MG tablet, Take 1 tablet (75 mg total) by mouth daily., Disp: 90 tablet, Rfl: 3   colestipol (COLESTID) 1 g tablet, TAKE 1 TABLET BY MOUTH EVERY DAY, Disp: 90 tablet, Rfl: 1   dicyclomine (BENTYL) 20 MG tablet, TAKE 1 TABLET BY MOUTH THREE TIMES A DAY, Disp: 270 tablet, Rfl: 1   ENTRESTO 49-51 MG, TAKE 1 TABLET BY MOUTH TWICE A DAY, Disp: 60 tablet, Rfl: 2   ezetimibe (ZETIA) 10 MG tablet, Take 1 tablet (10 mg total) by mouth at bedtime., Disp: 90 tablet, Rfl: 1   finasteride (PROSCAR) 5 MG tablet, TAKE 1 TABLET (5 MG TOTAL) BY MOUTH DAILY., Disp: 90 tablet, Rfl: 2   ibuprofen (ADVIL) 800 MG tablet, TAKE 1 TABLET BY MOUTH EVERY 8 HOURS AS NEEDED, Disp: 90 tablet, Rfl: 5   ipratropium-albuterol (DUONEB) 0.5-2.5 (3) MG/3ML SOLN, TAKE 3 MLS BY NEBULIZATION EVERY 2 (TWO) HOURS AS NEEDED (WHEEZE, SHORTNESS OF BREATH)., Disp: 90 mL, Rfl: 3   KLOR-CON M10 10 MEQ tablet, TAKE 1 TABLET BY MOUTH TWICE A DAY, Disp: 180 tablet, Rfl: 1   loratadine (CLARITIN) 10 MG tablet, Take 10 mg by mouth in the morning., Disp: , Rfl:    metoprolol succinate (TOPROL XL) 25 MG 24 hr tablet, Take 0.5 tablets (12.5 mg total) by mouth at bedtime., Disp: 45 tablet, Rfl: 3   Misc. Devices (PULSE OXIMETER) MISC, Monitor oxygen three times per day., Disp: 1 each, Rfl: 0   modafinil (PROVIGIL) 200 MG tablet, Take 1 tablet (200 mg total) by mouth daily., Disp: 30 tablet, Rfl: 5   ondansetron (ZOFRAN) 8 MG tablet, TAKE 1 TABLET BY MOUTH EVERY 8 HOURS AS NEEDED FOR NAUSEA FOR VOMITING, Disp: 270 tablet, Rfl: 1   OXYGEN, Inhale 3-4 L into the lungs continuous., Disp: , Rfl:    pantoprazole (PROTONIX) 40 MG tablet, TAKE 1 TABLET BY MOUTH TWICE A DAY, Disp: 180 tablet, Rfl: 1   Phenylephrine-Acetaminophen (TYLENOL SINUS CONGESTION/PAIN PO), Take 1 tablet by mouth daily as needed (Congestion)., Disp: , Rfl:    sertraline (ZOLOFT) 100 MG tablet, Take 2 tablets (200 mg total) by mouth daily., Disp:  180 tablet, Rfl: 1   triamcinolone (NASACORT) 55 MCG/ACT AERO nasal inhaler, Place 2-3 sprays into the nose daily as needed (allergies (when pollen is very high))., Disp: , Rfl:    spironolactone (ALDACTONE) 25 MG tablet, Take 0.5 tablets (12.5 mg total) by mouth daily., Disp: 45 tablet, Rfl: 3  Current Facility-Administered Medications:    methylPREDNISolone acetate (DEPO-MEDROL) injection 80 mg, 80 mg, Intramuscular, Once, Breeback, Jade L, PA-C   sodium chloride flush (NS) 0.9 % injection 3 mL, 3 mL, Intravenous, Q12H, Crenshaw, Madolyn Frieze, MD  Objective:   Vitals:   07/15/23 1536  BP: 122/78  Pulse: 98  SpO2: 96%  Weight: 232 lb 12.8 oz (105.6 kg)  Height: 5\' 11"  (1.803 m)    Estimated body mass index is 32.47 kg/m as calculated from the following:   Height as of this encounter: 5\' 11"  (1.803 m).   Weight as of this encounter: 232 lb 12.8 oz (105.6 kg).  @WEIGHTCHANGE @  [employer]   07/15/23 1536  Weight: 232 lb 12.8 oz (105.6 kg)     Physical Exam   General: No distress. Looks same O2 at rest: NO Cane present: no Sitting in wheel chair: no Frail: no Obese: YES Neuro: Alert and Oriented x 3. GCS 15. Speech normal Psych: Pleasant Resp:  Barrel Chest - no.  Wheeze - no, Crackles -  YES crackle at base, No overt respiratory distress CVS: Normal heart sounds. Murmurs - no Ext: Stigmata of Connective Tissue Disease - no HEENT: Normal upper airway. PEERL +. No post nasal drip        Assessment:       ICD-10-CM   1. Chronic respiratory failure with hypoxia (HCC)  J96.11     2. ILD (interstitial lung disease) (HCC)  J84.9     3. Sinus tachycardia  R00.0     4. Flu vaccine need  Z23          Plan:     Patient Instructions  Chronic hypoxemic respiratory failure ILD (interstitial lung disease) (HCC) Sinus tachycardia Flu vaccine need   Slowly and steadily getting worse/ progressive but stabile since last visit Too bad you cannot tolerare oral  prednisone or esbriet or ofev  -2 but we have not been able to start the schedule weekly Depo-Medrol as planned because of logistical issues Pulse ox 88% Room Air at rest 07/15/2023 There also appears to be resting tachycardia    Plan - shared decision making -I have sent a phone message to your primary care physician assistant to see if 80 mg once a week or Solu-Medrol can be done at the primary care office  -Also for the inappropriate sinus tachycardia  -I have sent a message to Dr. Elberta Fortis to see if ivabradine can be helpful for you  -Flu shot next week on your own  -Continue oxygen therapy to keep your pulse ox goal greater than 88%  -Spirometry and DLCO in 8-10 weeks -    Followup -Return to see  DR Raaswamy in 8-10 weeks; 15-minute visit but after PFT  -Symptom score and pulse ox on room air at rest at follow-up   FOLLOWUP Return in about 8 weeks (around 09/09/2023) for 15 min visit, after Cleda Daub and DLCO, ILD, with Dr Marchelle Gearing, Face to Face Visit.    SIGNATURE    Dr. Kalman Shan, M.D., F.C.C.P,  Pulmonary and Critical Care Medicine Staff Physician, [facility] Center Director - Interstitial Lung Disease  Program  Pulmonary Fibrosis [facility] [place], [place], [address]  Pager: [phone], If no answer or between  15:00h - 7:00h: call [phone] Telephone: [phone]  5:26 PM 07/15/2023

## 2023-07-16 ENCOUNTER — Ambulatory Visit (INDEPENDENT_AMBULATORY_CARE_PROVIDER_SITE_OTHER): Payer: Medicare HMO

## 2023-07-16 DIAGNOSIS — I420 Dilated cardiomyopathy: Secondary | ICD-10-CM | POA: Diagnosis not present

## 2023-07-19 ENCOUNTER — Encounter: Payer: Self-pay | Admitting: Cardiology

## 2023-07-19 ENCOUNTER — Ambulatory Visit: Payer: Medicare HMO | Attending: Cardiology | Admitting: Cardiology

## 2023-07-19 VITALS — BP 132/82 | HR 88 | Ht 71.0 in | Wt 229.0 lb

## 2023-07-19 DIAGNOSIS — I5022 Chronic systolic (congestive) heart failure: Secondary | ICD-10-CM | POA: Diagnosis not present

## 2023-07-19 DIAGNOSIS — I10 Essential (primary) hypertension: Secondary | ICD-10-CM | POA: Diagnosis not present

## 2023-07-19 DIAGNOSIS — I2510 Atherosclerotic heart disease of native coronary artery without angina pectoris: Secondary | ICD-10-CM | POA: Diagnosis not present

## 2023-07-19 LAB — CUP PACEART REMOTE DEVICE CHECK
Battery Remaining Longevity: 121 mo
Battery Voltage: 3.07 V
Brady Statistic RV Percent Paced: 7.56 %
Date Time Interrogation Session: 20241107194026
HighPow Impedance: 61 Ohm
Lead Channel Impedance Value: 1045 Ohm
Lead Channel Impedance Value: 1064 Ohm
Lead Channel Impedance Value: 323 Ohm
Lead Channel Impedance Value: 399 Ohm
Lead Channel Impedance Value: 399 Ohm
Lead Channel Impedance Value: 399 Ohm
Lead Channel Impedance Value: 532 Ohm
Lead Channel Impedance Value: 589 Ohm
Lead Channel Impedance Value: 627 Ohm
Lead Channel Impedance Value: 855 Ohm
Lead Channel Impedance Value: 855 Ohm
Lead Channel Impedance Value: 912 Ohm
Lead Channel Impedance Value: 950 Ohm
Lead Channel Pacing Threshold Amplitude: 0.5 V
Lead Channel Pacing Threshold Amplitude: 0.625 V
Lead Channel Pacing Threshold Amplitude: 1.125 V
Lead Channel Pacing Threshold Pulse Width: 0.4 ms
Lead Channel Pacing Threshold Pulse Width: 0.4 ms
Lead Channel Pacing Threshold Pulse Width: 0.8 ms
Lead Channel Sensing Intrinsic Amplitude: 18.6 mV
Lead Channel Sensing Intrinsic Amplitude: 4.8 mV
Lead Channel Setting Pacing Amplitude: 1.75 V
Lead Channel Setting Pacing Amplitude: 3.25 V
Lead Channel Setting Pacing Amplitude: 3.25 V
Lead Channel Setting Pacing Pulse Width: 0.4 ms
Lead Channel Setting Pacing Pulse Width: 0.8 ms
Lead Channel Setting Sensing Sensitivity: 0.3 mV
Zone Setting Status: 755011
Zone Setting Status: 755011
Zone Setting Status: 755011

## 2023-07-19 NOTE — Progress Notes (Signed)
  Electrophysiology Office Note:   Date:  07/19/2023  ID:  ZELMER MEUSE, DOB 1960-11-09, MRN [id number]  Primary Cardiologist: Olga Millers, MD Electrophysiologist: Regan Lemming, MD      History of Present Illness:   Jake [name] is a 62 y.o. male with h/o chronic systolic heart failure, interstitial lung disease, pulmonary hypertension seen today for routine electrophysiology followup.   Since last being seen in our clinic the patient reports continued shortness of breath.  He noted minimal improvement since the biventricular device was implanted.  He does continue to have fatigue and weakness, but does state that his lung issues have been progressing.  he denies chest pain, palpitations, dyspnea, PND, orthopnea, nausea, vomiting, dizziness, syncope, edema, weight gain, or early satiety.   Review of systems complete and found to be negative unless listed in HPI.      EP Information / Studies Reviewed:    EKG is ordered today. Personal review as below.  EKG Interpretation Date/Time:  Monday July 19 2023 14:58:46 EST Ventricular Rate:  88 PR Interval:  152 QRS Duration:  150 QT Interval:  388 QTC Calculation: 469 R Axis:   -59  Text Interpretation: Atrial-sensed ventricular-paced rhythm When compared with ECG of 05-May-2023 15:46, Vent. rate has decreased BY   6 BPM Confirmed by Marcene Laskowski ([id number]) on 07/19/2023 3:18:06 PM   ICD Interrogation-  reviewed in detail today,  See PACEART report.  Device History: Medtronic BiV ICD implanted 04/13/2021 for CHF History of appropriate therapy: No History of AAD therapy: No   Risk Assessment/Calculations:              Physical Exam:   VS:  BP 132/82   Pulse 88   Ht 5\' 11"  (1.803 m)   Wt 229 lb (103.9 kg)   SpO2 98%   BMI 31.94 kg/m    Wt Readings from Last 3 Encounters:  07/19/23 229 lb (103.9 kg)  07/15/23 232 lb 12.8 oz (105.6 kg)  06/30/23 226 lb (102.5 kg)     GEN: Well nourished, well developed  in no acute distress NECK: No JVD; No carotid bruits CARDIAC: Regular rate and rhythm, no murmurs, rubs, gallops RESPIRATORY:  Clear to auscultation without rales, wheezing or rhonchi  ABDOMEN: Soft, non-tender, non-distended EXTREMITIES:  No edema; No deformity   ASSESSMENT AND PLAN:    Chronic systolic dysfunction s/p Medtronic CRT-D  euvolemic today Stable on an appropriate medical regimen Normal ICD function See Pace Art report No changes today  2.  Coronary artery disease: No current chest pain.  Plan per  3.  Hypertension: Currently well-controlled  4.  Obstructive sleep apnea: CPAP compliance encouraged  5.  Interstitial lung disease: Managed by pulmonary medicine  Disposition:   Follow up with Dr. Elberta Fortis in 12 months   Signed, Keaisha Sublette Jorja Loa, MD

## 2023-07-19 NOTE — Telephone Encounter (Signed)
I am pretty sure this will be indefinitely. But go ahead and get patient in for weekly injections.

## 2023-07-20 NOTE — Telephone Encounter (Signed)
Patient scheduled for nurse visit

## 2023-07-20 NOTE — Telephone Encounter (Signed)
Hi Jade  This will be indefinitely for now  Thanks     SIGNATURE    Dr. Kalman Shan, M.D., F.C.C.P,  Pulmonary and Critical Care Medicine Staff Physician, [facility] Center Director - Interstitial Lung Disease  Program  Pulmonary Fibrosis [facility] [place], [place], [address]   Pager: [phone], If no answer  -> Check AMION or Try [phone] Telephone (clinical office): [phone] Telephone (research): [phone]  12:24 PM 07/20/2023

## 2023-07-21 ENCOUNTER — Encounter: Payer: Self-pay | Admitting: Psychiatry

## 2023-07-23 ENCOUNTER — Ambulatory Visit (INDEPENDENT_AMBULATORY_CARE_PROVIDER_SITE_OTHER): Payer: Medicare HMO

## 2023-07-23 DIAGNOSIS — J849 Interstitial pulmonary disease, unspecified: Secondary | ICD-10-CM

## 2023-07-23 MED ORDER — METHYLPREDNISOLONE ACETATE 80 MG/ML IJ SUSP
80.0000 mg | Freq: Once | INTRAMUSCULAR | Status: AC
Start: 2023-07-23 — End: 2023-07-23
  Administered 2023-07-23: 80 mg via INTRAMUSCULAR

## 2023-07-23 NOTE — Progress Notes (Signed)
   Established Patient Office Visit  Subjective   Patient ID: Jake [name], male    DOB: 1961-07-22  Age: 61 y.o. MRN: [id number]  Chief Complaint  Patient presents with   ILD    HPI  Jake [name] is here for Depo-Medrol 80 mg due to ILD (interstitial lung disease).  ROS    Objective:     There were no vitals taken for this visit.   Physical Exam   No results found for any visits on 07/23/23.    The ASCVD Risk score (Arnett DK, et al., 2019) failed to calculate for the following reasons:   The patient has a prior MI or stroke diagnosis    Assessment & Plan:  Depo-Medrol injection - Patient tolerated injection well without complications. Patient advised to schedule next injection 7 days from today.    Problem List Items Addressed This Visit       Unprioritized   ILD (interstitial lung disease) (HCC) - Primary    Return in about 1 week (around 07/30/2023) for Depo-Medrol 80 mg. .    Esmond Harps, CMA

## 2023-07-27 NOTE — Progress Notes (Signed)
Remote ICD transmission.   

## 2023-07-29 DIAGNOSIS — J849 Interstitial pulmonary disease, unspecified: Secondary | ICD-10-CM | POA: Diagnosis not present

## 2023-07-30 ENCOUNTER — Ambulatory Visit: Payer: Medicare HMO

## 2023-08-01 ENCOUNTER — Other Ambulatory Visit: Payer: Self-pay | Admitting: Physician Assistant

## 2023-08-01 DIAGNOSIS — N401 Enlarged prostate with lower urinary tract symptoms: Secondary | ICD-10-CM

## 2023-08-02 ENCOUNTER — Ambulatory Visit (INDEPENDENT_AMBULATORY_CARE_PROVIDER_SITE_OTHER): Payer: Medicare HMO | Admitting: Physician Assistant

## 2023-08-02 VITALS — BP 116/84 | HR 87 | Temp 98.6°F | Ht 71.0 in | Wt 220.0 lb

## 2023-08-02 DIAGNOSIS — J9611 Chronic respiratory failure with hypoxia: Secondary | ICD-10-CM

## 2023-08-02 DIAGNOSIS — J849 Interstitial pulmonary disease, unspecified: Secondary | ICD-10-CM

## 2023-08-02 MED ORDER — METHYLPREDNISOLONE ACETATE 80 MG/ML IJ SUSP
80.0000 mg | Freq: Once | INTRAMUSCULAR | Status: DC
Start: 2023-08-02 — End: 2023-08-02

## 2023-08-02 MED ORDER — METHYLPREDNISOLONE ACETATE 80 MG/ML IJ SUSP
80.0000 mg | Freq: Once | INTRAMUSCULAR | Status: AC
Start: 2023-08-02 — End: 2023-08-02
  Administered 2023-08-02: 80 mg via INTRAMUSCULAR

## 2023-08-02 NOTE — Progress Notes (Unsigned)
Patient here for Depo-Medrol 80 mg. Patient advised to schedule next injection in one week.

## 2023-08-03 NOTE — Progress Notes (Signed)
Agree with weekly depo medrol 80mg  shots per pulmonologist.

## 2023-08-09 ENCOUNTER — Ambulatory Visit (INDEPENDENT_AMBULATORY_CARE_PROVIDER_SITE_OTHER): Payer: Medicare HMO

## 2023-08-09 ENCOUNTER — Telehealth: Payer: Self-pay

## 2023-08-09 DIAGNOSIS — J849 Interstitial pulmonary disease, unspecified: Secondary | ICD-10-CM | POA: Diagnosis not present

## 2023-08-09 MED ORDER — METHYLPREDNISOLONE ACETATE 80 MG/ML IJ SUSP
80.0000 mg | Freq: Once | INTRAMUSCULAR | Status: AC
Start: 2023-08-09 — End: 2023-08-09
  Administered 2023-08-09: 80 mg via INTRAMUSCULAR

## 2023-08-09 MED ORDER — METHYLPREDNISOLONE ACETATE 80 MG/ML IJ SUSP
80.0000 mg | Freq: Once | INTRAMUSCULAR | Status: DC
Start: 2023-08-09 — End: 2023-08-09

## 2023-08-09 NOTE — Telephone Encounter (Signed)
Jake [name] is receiving Depo Medrol 80 mg injections. He wants to know when he can get a flu vaccine, Covid vaccine and pneumonia vaccine.

## 2023-08-09 NOTE — Progress Notes (Signed)
   Established Patient Office Visit  Subjective   Patient ID: Jake [name], male    DOB: 08/19/1961  Age: 62 y.o. MRN: [id number]  Chief Complaint  Patient presents with   Interstitial Lung Disease    HPI  Jake [name] is here for Depo-Medrol 80 mg injection.   ROS    Objective:     There were no vitals taken for this visit.   Physical Exam   No results found for any visits on 08/09/23.    The ASCVD Risk score (Arnett DK, et al., 2019) failed to calculate for the following reasons:   The patient has a prior MI or stroke diagnosis    Assessment & Plan:  Depo-Medrol injection - Patient tolerated injection well without complications. Patient advised to schedule next injection 7 days from today.    Problem List Items Addressed This Visit       Unprioritized   ILD (interstitial lung disease) (HCC) - Primary    Return in about 1 week (around 08/16/2023) for Depo Medrol 80 mg injection. Earna Coder, Janalyn Harder, CMA

## 2023-08-09 NOTE — Telephone Encounter (Signed)
One at a time with depo is fine.

## 2023-08-11 NOTE — Telephone Encounter (Signed)
Patient advised.

## 2023-08-13 ENCOUNTER — Other Ambulatory Visit: Payer: Self-pay | Admitting: Physician Assistant

## 2023-08-13 ENCOUNTER — Other Ambulatory Visit: Payer: Self-pay | Admitting: Cardiology

## 2023-08-13 DIAGNOSIS — Z955 Presence of coronary angioplasty implant and graft: Secondary | ICD-10-CM

## 2023-08-13 DIAGNOSIS — I10 Essential (primary) hypertension: Secondary | ICD-10-CM

## 2023-08-13 DIAGNOSIS — J849 Interstitial pulmonary disease, unspecified: Secondary | ICD-10-CM

## 2023-08-13 DIAGNOSIS — I5022 Chronic systolic (congestive) heart failure: Secondary | ICD-10-CM

## 2023-08-13 DIAGNOSIS — E782 Mixed hyperlipidemia: Secondary | ICD-10-CM

## 2023-08-13 DIAGNOSIS — I214 Non-ST elevation (NSTEMI) myocardial infarction: Secondary | ICD-10-CM

## 2023-08-13 DIAGNOSIS — I2510 Atherosclerotic heart disease of native coronary artery without angina pectoris: Secondary | ICD-10-CM

## 2023-08-13 DIAGNOSIS — J9611 Chronic respiratory failure with hypoxia: Secondary | ICD-10-CM

## 2023-08-16 ENCOUNTER — Ambulatory Visit: Payer: Medicare HMO

## 2023-08-17 ENCOUNTER — Encounter: Payer: Self-pay | Admitting: Physician Assistant

## 2023-08-20 ENCOUNTER — Ambulatory Visit (INDEPENDENT_AMBULATORY_CARE_PROVIDER_SITE_OTHER): Payer: Medicare HMO | Admitting: Physician Assistant

## 2023-08-20 VITALS — BP 145/72 | HR 86 | Ht 71.0 in | Wt 220.0 lb

## 2023-08-20 DIAGNOSIS — Z23 Encounter for immunization: Secondary | ICD-10-CM | POA: Diagnosis not present

## 2023-08-20 DIAGNOSIS — J849 Interstitial pulmonary disease, unspecified: Secondary | ICD-10-CM

## 2023-08-20 MED ORDER — METHYLPREDNISOLONE ACETATE 80 MG/ML IJ SUSP
80.0000 mg | Freq: Once | INTRAMUSCULAR | Status: AC
Start: 2023-08-20 — End: 2023-08-20
  Administered 2023-08-20: 80 mg via INTRAMUSCULAR

## 2023-08-20 NOTE — Progress Notes (Signed)
Agree with above plan. 

## 2023-08-20 NOTE — Progress Notes (Signed)
Pt presents to day for immunization. No allergy to eggs or latex.   Location: LD  Pt tolerated well.   

## 2023-08-23 ENCOUNTER — Ambulatory Visit: Payer: Medicare HMO

## 2023-08-28 DIAGNOSIS — J849 Interstitial pulmonary disease, unspecified: Secondary | ICD-10-CM | POA: Diagnosis not present

## 2023-08-30 ENCOUNTER — Ambulatory Visit (INDEPENDENT_AMBULATORY_CARE_PROVIDER_SITE_OTHER): Payer: Medicare HMO

## 2023-08-30 DIAGNOSIS — Z23 Encounter for immunization: Secondary | ICD-10-CM

## 2023-08-30 DIAGNOSIS — J849 Interstitial pulmonary disease, unspecified: Secondary | ICD-10-CM | POA: Diagnosis not present

## 2023-08-30 MED ORDER — METHYLPREDNISOLONE ACETATE 80 MG/ML IJ SUSP
80.0000 mg | Freq: Once | INTRAMUSCULAR | Status: AC
Start: 2023-08-30 — End: 2023-08-30
  Administered 2023-08-30: 80 mg via INTRAMUSCULAR

## 2023-08-30 NOTE — Addendum Note (Signed)
Addended by: Chalmers Cater on: 08/30/2023 03:40 PM   Modules accepted: Orders, Level of Service

## 2023-08-30 NOTE — Progress Notes (Addendum)
   Established Patient Office Visit  Subjective   Patient ID: Jake [name], male    DOB: 1961-09-05  Age: 62 y.o. MRN: [id number]  Chief Complaint  Patient presents with   Injections    HPI  Jake [name] is here for Depo-Medrol injection and flu vaccine.   ROS    Objective:     There were no vitals taken for this visit.   Physical Exam   No results found for any visits on 08/30/23.    The ASCVD Risk score (Arnett DK, et al., 2019) failed to calculate for the following reasons:   Risk score cannot be calculated because patient has a medical history suggesting prior/existing ASCVD    Assessment & Plan:  Depo- Medrol injection - Patient tolerated injection well without complications. Patient advised to schedule next injection 7 days from today.    Flu vaccine - Patient tolerated injection well without complications.   Problem List Items Addressed This Visit       Unprioritized   ILD (interstitial lung disease) (HCC) - Primary   Other Visit Diagnoses       Encounter for immunization       Relevant Orders   Flu vaccine trivalent PF, 6mos and older(Flulaval,Afluria,Fluarix,Fluzone) (Completed)       Return in about 1 week (around 09/06/2023) for Depo Medrol 80 mg and pneumonia vaccine. Earna Coder, Janalyn Harder, CMA

## 2023-09-06 ENCOUNTER — Ambulatory Visit (INDEPENDENT_AMBULATORY_CARE_PROVIDER_SITE_OTHER): Payer: Medicare HMO

## 2023-09-06 DIAGNOSIS — J849 Interstitial pulmonary disease, unspecified: Secondary | ICD-10-CM

## 2023-09-06 DIAGNOSIS — Z23 Encounter for immunization: Secondary | ICD-10-CM

## 2023-09-06 MED ORDER — METHYLPREDNISOLONE ACETATE 40 MG/ML IJ SUSP: 80.0000 mg | Freq: Once | INTRAMUSCULAR | Status: DC

## 2023-09-06 MED ORDER — METHYLPREDNISOLONE ACETATE 80 MG/ML IJ SUSP
80.0000 mg | Freq: Once | INTRAMUSCULAR | Status: AC
  Administered 2023-09-06: 80 mg via INTRAMUSCULAR

## 2023-09-06 MED ORDER — METHYLPREDNISOLONE ACETATE 40 MG/ML IJ SUSP: 40.0000 mg | Freq: Once | INTRAMUSCULAR | Status: DC

## 2023-09-07 ENCOUNTER — Telehealth: Payer: Self-pay

## 2023-09-07 DIAGNOSIS — Z23 Encounter for immunization: Secondary | ICD-10-CM | POA: Diagnosis not present

## 2023-09-07 DIAGNOSIS — J849 Interstitial pulmonary disease, unspecified: Secondary | ICD-10-CM | POA: Diagnosis not present

## 2023-09-07 MED ORDER — METHYLPREDNISOLONE SODIUM SUCC 40 MG IJ SOLR
40.0000 mg | Freq: Once | INTRAMUSCULAR | Status: AC
  Administered 2023-09-07: 40 mg via INTRAMUSCULAR

## 2023-09-07 NOTE — Telephone Encounter (Signed)
 Copied from CRM [id number]. Topic: General - Other >> Sep 07, 2023 10:12 AM Alcus Dad H wrote: Reason for CRM: Patient stated he is going to wait to get steroid shot because he is having a reaction to the first one he already received. He won't be coming today

## 2023-09-07 NOTE — Telephone Encounter (Signed)
He states he is having more irritation like he has when taking oral steroids. He will return on Monday for next injection.    No worsening of breathing issues.

## 2023-09-07 NOTE — Progress Notes (Signed)
HPI  Pt here today for Solu Medrol 80 mg. Pt denies CP, SOB, headaches, or medication problem.          Assessment and Plan:  Pt given Solu Medrol 80mg  in RUOQ tolerated well. No reaction noted.   Advised pt to schedule next appt. Depo Medrol and pneumonia vaccine in 7 days.

## 2023-09-07 NOTE — Progress Notes (Signed)
Pt was initially given solumedrol but called back and given depo medrol to last him the full week. No concerns. He will proceed with depo medrol 80mg  weekly from now on out per pulmonology orders.

## 2023-09-10 ENCOUNTER — Encounter: Payer: Self-pay | Admitting: Physician Assistant

## 2023-09-13 ENCOUNTER — Ambulatory Visit (INDEPENDENT_AMBULATORY_CARE_PROVIDER_SITE_OTHER): Payer: Medicare HMO

## 2023-09-13 DIAGNOSIS — J849 Interstitial pulmonary disease, unspecified: Secondary | ICD-10-CM | POA: Diagnosis not present

## 2023-09-13 DIAGNOSIS — Z23 Encounter for immunization: Secondary | ICD-10-CM

## 2023-09-13 MED ORDER — METHYLPREDNISOLONE ACETATE 80 MG/ML IJ SUSP
80.0000 mg | Freq: Once | INTRAMUSCULAR | Status: AC
  Administered 2023-09-13: 80 mg via INTRAMUSCULAR

## 2023-09-13 NOTE — Progress Notes (Signed)
   Established Patient Office Visit  Subjective   Patient ID: Jake [name], male    DOB: 07/18/1961  Age: 63 y.o. MRN: [id number]  Chief Complaint  Patient presents with   Immunizations    HPI  Jake [name] is here for Depo Medrol  80 mg injection and pneumonia vaccine.   ROS    Objective:     There were no vitals taken for this visit.   Physical Exam   No results found for any visits on 09/13/23.    The ASCVD Risk score (Arnett DK, et al., 2019) failed to calculate for the following reasons:   Risk score cannot be calculated because patient has a medical history suggesting prior/existing ASCVD    Assessment & Plan:  Depo Medrol  - Patient tolerated injection well without complications. Patient advised to schedule next injection 7 days from today.   Pneumonia vaccine - Patient tolerated injection well without complications.   Problem List Items Addressed This Visit       Unprioritized   ILD (interstitial lung disease) (HCC)   Other Visit Diagnoses       Immunization due    -  Primary   Relevant Orders   Pneumococcal conjugate vaccine 20-valent (Prevnar 20) (Completed)       Return in about 1 week (around 09/20/2023) for Depo Medrol  80 mg injection. SABRA Pear, Jon Mayor, CMA

## 2023-09-13 NOTE — Progress Notes (Signed)
 HPI: Follow-up CAD.  CPX March 2022 showed submaximal effort, moderate functional impairment due to pulmonary restriction.  High resolution chest CT March 2022 showed interstitial lung disease and aortic atherosclerosis; enlarged pulmonary trunk suggestive of pulmonary hypertension.  Cardiac catheterization March 2022 showed normal left main, minimal irregularities in the LAD, 2 small diagonals, circumflex, 2 obtuse marginals, posterior lateral, PDA and nondominant right coronary artery; ejection fraction 30 to 35% with mild left ventricular enlargement.  Echocardiogram June 2023 showed ejection fraction 40 to 45%, grade 1 diastolic dysfunction, moderate left atrial enlargement, mild mitral regurgitation.  Right heart catheterization June 2023 showed cardiac output 6.59 L/min, cardiac index 2.96 L/min, RA pressure 11, PA pressure 25/12 and pulmonary capillary wedge pressure 8. CTA January 2024 showed pneumonitis and atheromatous changes of the thoracic aorta but no aneurysm.     Patient admitted to [facility] March 2024 with STEMI.  Cardiac catheterization revealed 95 to 99% mid circumflex, 30% LAD, 50% PDA.  Patient had PCI of the left circumflex with drug-eluting stent.  Echocardiogram repeated February 23, 2023 and showed ejection fraction 25%, hypokinesis of the anterolateral and inferoseptal walls, severe left ventricular enlargement, mild left ventricular hypertrophy, grade 1 diastolic dysfunction, mild left atrial enlargement, mild mitral regurgitation and mildly dilated aortic root at 42 mm.  High-resolution chest CT June 2024 showed interstitial lung disease but no aneurysm.  Had biventricular ICD placed August 2024.  Since last seen he does have dyspnea on exertion secondary to his lung disease.  No orthopnea, PND, pedal edema, exertional chest pain or syncope.  Current Outpatient Medications  Medication Sig Dispense Refill   albuterol  (VENTOLIN  HFA) 108 (90 Base) MCG/ACT inhaler INHALE  2 PUFFS BY MOUTH EVERY 6 HOURS AS NEEDED 18 each 3   AMBULATORY NON FORMULARY MEDICATION Continuous positive airway pressure (CPAP) machine set at autotitration of H2O pressure, with all supplemental supplies as needed. 1 each 0   aspirin  EC 81 MG tablet Take 81 mg by mouth daily. Swallow whole.     chlorthalidone  (HYGROTON ) 25 MG tablet TAKE 1 TABLET BY MOUTH EVERY DAY 90 tablet 1   clopidogrel  (PLAVIX ) 75 MG tablet Take 1 tablet (75 mg total) by mouth daily. 90 tablet 3   colestipol  (COLESTID ) 1 g tablet TAKE 1 TABLET BY MOUTH EVERY DAY 90 tablet 1   dicyclomine  (BENTYL ) 20 MG tablet TAKE 1 TABLET BY MOUTH THREE TIMES A DAY 270 tablet 1   ezetimibe  (ZETIA ) 10 MG tablet TAKE 1 TABLET BY MOUTH EVERYDAY AT BEDTIME 90 tablet 2   finasteride  (PROSCAR ) 5 MG tablet TAKE 1 TABLET (5 MG TOTAL) BY MOUTH DAILY. 90 tablet 2   ibuprofen  (ADVIL ) 800 MG tablet TAKE 1 TABLET BY MOUTH EVERY 8 HOURS AS NEEDED 90 tablet 5   ipratropium-albuterol  (DUONEB) 0.5-2.5 (3) MG/3ML SOLN TAKE 3 MLS BY NEBULIZATION EVERY 2 (TWO) HOURS AS NEEDED (WHEEZE, SHORTNESS OF BREATH). 90 mL 3   KLOR-CON  M10 10 MEQ tablet TAKE 1 TABLET BY MOUTH TWICE A DAY 180 tablet 1   loratadine  (CLARITIN ) 10 MG tablet Take 10 mg by mouth in the morning.     metoprolol  succinate (TOPROL  XL) 25 MG 24 hr tablet Take 0.5 tablets (12.5 mg total) by mouth at bedtime. 45 tablet 3   Misc. Devices (PULSE OXIMETER) MISC Monitor oxygen  three times per day. 1 each 0   modafinil  (PROVIGIL ) 200 MG tablet Take 1 tablet (200 mg total) by mouth daily. 30 tablet 5  ondansetron  (ZOFRAN ) 8 MG tablet TAKE 1 TABLET BY MOUTH EVERY 8 HOURS AS NEEDED FOR NAUSEA FOR VOMITING 270 tablet 1   OXYGEN  Inhale 3-4 L into the lungs continuous.     pantoprazole  (PROTONIX ) 40 MG tablet TAKE 1 TABLET BY MOUTH TWICE A DAY 180 tablet 1   Phenylephrine -Acetaminophen  (TYLENOL  SINUS CONGESTION/PAIN PO) Take 1 tablet by mouth daily as needed (Congestion).     sacubitril -valsartan   (ENTRESTO ) 49-51 MG TAKE 1 TABLET BY MOUTH TWICE A DAY 180 tablet 2   sodium bicarbonate  650 MG tablet TAKE ONE TABLET BY MOUTH 4 TIMES DAILY 120 tablet 5   triamcinolone  (NASACORT ) 55 MCG/ACT AERO nasal inhaler Place 2-3 sprays into the nose daily as needed (allergies (when pollen is very high)).     busPIRone  (BUSPAR ) 15 MG tablet Take 1 tablet (15 mg total) by mouth 2 (two) times daily. 180 tablet 1   dicyclomine  (BENTYL ) 10 MG capsule TAKE 1 CAPSULE BY MOUTH TWICE A DAY (Patient not taking: Reported on 09/15/2023) 60 capsule 2   sertraline  (ZOLOFT ) 100 MG tablet Take 2 tablets (200 mg total) by mouth daily. 180 tablet 1   spironolactone  (ALDACTONE ) 25 MG tablet Take 0.5 tablets (12.5 mg total) by mouth daily. 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    Past Surgical History:  Procedure Laterality Date   ANKLE FRACTURE SURGERY Left    APPENDECTOMY     BIOPSY  11/18/2021   Procedure: BIOPSY;  Surgeon: San Sandor GAILS, DO;  Location: [facility];  Service: Gastroenterology;;  EGD and COLON   BIV ICD INSERTION CRT-D N/A 04/14/2023   Procedure: BIV ICD INSERTION CRT-D;  Surgeon: Inocencio Soyla Lunger, MD;  Location: [facility] INVASIVE CV LAB;  Service: Cardiovascular;  Laterality: N/A;   CERVICAL FUSION     CHOLECYSTECTOMY     COLON SURGERY     COLONOSCOPY WITH PROPOFOL  N/A 11/18/2021   Procedure: COLONOSCOPY WITH PROPOFOL ;  Surgeon: San Sandor GAILS, DO;  Location: [facility];  Service: Gastroenterology;  Laterality: N/A;   ESOPHAGOGASTRODUODENOSCOPY (EGD) WITH PROPOFOL  N/A 11/18/2021   Procedure: ESOPHAGOGASTRODUODENOSCOPY (EGD) WITH PROPOFOL ;  Surgeon: San Sandor GAILS, DO;  Location: [facility];  Service: Gastroenterology;  Laterality: N/A;   HERNIA REPAIR     POLYPECTOMY  11/18/2021   Procedure: POLYPECTOMY;  Surgeon: San Sandor GAILS, DO;  Location: [facility];  Service: Gastroenterology;;   RIGHT HEART CATH N/A 02/13/2022   Procedure: RIGHT HEART CATH;  Surgeon: Verlin Lonni BIRCH, MD;  Location: [facility] INVASIVE CV LAB;  Service: Cardiovascular;  Laterality: N/A;    Social History   Socioeconomic History   Marital status: Married    Spouse name: Dawn   Number of children: 1   Years of education: 14   Highest education level: Some college, no degree  Occupational History   Occupation: Disabled  Tobacco Use   Smoking status:  Former    Current packs/day: 0.00    Average packs/day: 1 pack/day for 28.0 years (28.0 ttl pk-yrs)    Types: Cigarettes    Start date: 73    Quit date: 2010    Years since quitting: 15.0   Smokeless tobacco: Never  Vaping Use   Vaping status: Never Used  Substance and Sexual Activity   Alcohol use: No   Drug use: No    Comment: 04/06/2018 Patient's wife wanted us  to be very aware that her husband is a recovering narcotic abuser   Sexual activity: Yes  Other Topics Concern   Not on file  Social History Narrative   Lives with his wife, daughter, son-in-law and 2 two grandchildren. Likes to spend to time with grandchildren. Does not exercise due to back issues in the past and respiratory problems.   Social Drivers of Corporate Investment Banker Strain: Low Risk  (02/04/2022)   Overall Financial Resource Strain (CARDIA)    Difficulty of Paying Living Expenses: Not hard at all  Food Insecurity: Low Risk  (11/16/2022)   [facility]   Hunger Vital Sign    Worried About Running  Out of Food in the Last Year: Never true    Ran Out of Food in the Last Year: Never true  Transportation Needs: Unmet Transportation Needs (11/16/2022)   [facility]   Transportation    In the past 12 months, has lack of reliable transportation kept you from medical appointments, meetings, work or from getting things needed for daily living? : Yes  Physical Activity: Inactive (02/04/2022)   Exercise Vital Sign    Days of Exercise per Week: 0 days    Minutes of Exercise per Session: 0 min  Stress: No Stress Concern Present (02/04/2022)   [employer] of Occupational Health - Occupational Stress Questionnaire    Feeling of Stress : Not at all  Social Connections: Moderately Integrated (02/04/2022)   Social Connection and Isolation Panel [NHANES]    Frequency of Communication with Friends and Family: Once a week    Frequency of Social Gatherings with Friends and Family: More than three times a week    Attends Religious Services: More than 4 times per year    Active Member of [employer] or Organizations: No    Attends Banker Meetings: Never    Marital Status: Married  Catering Manager Violence: Not At Risk (02/04/2022)   Humiliation, Afraid, Rape, and Kick questionnaire    Fear of Current or Ex-Partner: No    Emotionally Abused: No    Physically Abused: No    Sexually Abused: No    Family History  Problem Relation Age of Onset   Cancer Mother        breast   Depression Mother    Hyperlipidemia Mother    Dementia Mother    Alcohol abuse Father    Cancer Father        skin   Hyperlipidemia Father    Alzheimer's disease Father    Cancer Maternal Grandfather    Cancer Paternal Grandmother    Pancreatic cancer Paternal Uncle    Colon cancer Neg Hx    Esophageal cancer Neg Hx    Stomach cancer Neg Hx     ROS: no fevers or chills, productive cough, hemoptysis, dysphasia, odynophagia, melena, hematochezia, dysuria, hematuria, rash, seizure  activity, orthopnea, PND, pedal edema, claudication. Remaining systems are negative.  Physical Exam: Well-developed well-nourished in  no acute distress.  Skin is warm and dry.  HEENT is normal.  Neck is supple.  Chest is clear to auscultation with normal expansion.  Cardiovascular exam is regular rate and rhythm.  Abdominal exam nontender or distended. No masses palpated. Extremities show no edema. neuro grossly intact  A/P  1 coronary artery disease-plan to continue aspirin  and Plavix .  Discontinue Plavix  April 1 which will be 1 year from his previous stent.  He is intolerant to statins.  He denies chest pain.  2 status post biventricular ICD-per Dr. Inocencio.  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  Previously declined SGLT2 inhibitor.  4 hyperlipidemia-continue Zetia .  Previously did not tolerate statins or Repatha .  5 hypertension-patient's blood pressure is elevated.  Have asked him to follow this at home and if it is typically in the systolic of 140 range we will advance Entresto .  6 obstructive sleep apnea-continue CPAP.  7 interstitial lung disease-per pulmonary.  Redell Shallow, MD

## 2023-09-15 ENCOUNTER — Ambulatory Visit: Payer: Medicare HMO | Admitting: Cardiology

## 2023-09-15 ENCOUNTER — Encounter: Payer: Self-pay | Admitting: Cardiology

## 2023-09-15 VITALS — BP 142/89 | HR 101 | Ht 71.0 in | Wt 183.0 lb

## 2023-09-15 DIAGNOSIS — I2510 Atherosclerotic heart disease of native coronary artery without angina pectoris: Secondary | ICD-10-CM

## 2023-09-15 DIAGNOSIS — J849 Interstitial pulmonary disease, unspecified: Secondary | ICD-10-CM

## 2023-09-15 DIAGNOSIS — I5022 Chronic systolic (congestive) heart failure: Secondary | ICD-10-CM | POA: Diagnosis not present

## 2023-09-15 DIAGNOSIS — I10 Essential (primary) hypertension: Secondary | ICD-10-CM

## 2023-09-15 DIAGNOSIS — E785 Hyperlipidemia, unspecified: Secondary | ICD-10-CM

## 2023-09-15 NOTE — Patient Instructions (Signed)
   Follow-Up: At [facility], you and your health needs are our priority.  As part of our continuing mission to provide you with exceptional heart care, we have created designated Provider Care Teams.  These Care Teams include your primary Cardiologist (physician) and Advanced Practice Providers (APPs -  Physician Assistants and Nurse Practitioners) who all work together to provide you with the care you need, when you need it.    Your next appointment:   6 month(s)  Provider:   Olga Millers, MD

## 2023-09-17 ENCOUNTER — Telehealth: Payer: Self-pay | Admitting: Cardiology

## 2023-09-17 DIAGNOSIS — Z79899 Other long term (current) drug therapy: Secondary | ICD-10-CM

## 2023-09-17 DIAGNOSIS — I10 Essential (primary) hypertension: Secondary | ICD-10-CM

## 2023-09-17 MED ORDER — ENTRESTO 97-103 MG PO TABS: 1.0000 | ORAL_TABLET | Freq: Two times a day (BID) | ORAL | 2 refills | Status: DC

## 2023-09-17 NOTE — Telephone Encounter (Signed)
 Jake Bunting, MD  You23 minutes ago (9:49 AM)    Increase entresto to 97/103 BID; follow BP; bmet one week Olga Millers

## 2023-09-17 NOTE — Telephone Encounter (Signed)
 Patient identification verified by 2 forms. Bertina Cooks, RN    Called and spoke to patient  Patient states:   -saw Dr. Pietro Monday, provider was concerned about BP   -BP continued to be high after visit   -186/86 in left arm first this this morning before entresto   -30 minutes later after taking entresto  131/78 in right arm, 1 minute later 160/80 left arm   -patients left arm is consistently high   -has dizziness, not new, due to morning medications  -recalls discussing increasing Entresto  with Dr. Pietro at last visit   -took Entresto  49-51 this morning   -spironolactone  12.5mg  nightly   -Metoprolol  25mg  nightly  Patient denies:   -headaches   -chest pressure   -visual changes/disturbances  Informed patient RN will send message to DR. Crenshaw to review  Patient verbalized understanding, no questions at this time

## 2023-09-17 NOTE — Telephone Encounter (Signed)
 Patient identification verified by 2 forms. Bertina Cooks, RN    Called and spoke to patient  Informed patient:   -provider recommends increase entresto  97/103 BID   -BMET in one week   -check BP daily, one hour after medication, keep log  Patient aware Rx sent to preferred pharmacy  Patient verbalized understanding, no questions at this time

## 2023-09-17 NOTE — Telephone Encounter (Signed)
 Pt c/o BP issue: STAT if pt c/o blurred vision, one-sided weakness or slurred speech  1. What are your last 5 BP readings? 175/98, 183/93,175/92, 186/86 -this morning  2. Are you having any other symptoms (ex. Dizziness, headache, blurred vision, passed out)? Dizziness but he states meds make him dizzy anyways  3. What is your BP issue? Was told to report readings which are still elevated since ov

## 2023-09-20 ENCOUNTER — Other Ambulatory Visit: Payer: Self-pay | Admitting: Cardiology

## 2023-09-20 ENCOUNTER — Ambulatory Visit: Payer: Medicare HMO | Admitting: Internal Medicine

## 2023-09-20 ENCOUNTER — Other Ambulatory Visit: Payer: Self-pay | Admitting: Physician Assistant

## 2023-09-20 ENCOUNTER — Other Ambulatory Visit: Payer: Self-pay | Admitting: Psychiatry

## 2023-09-20 ENCOUNTER — Telehealth: Payer: Self-pay

## 2023-09-20 ENCOUNTER — Ambulatory Visit (INDEPENDENT_AMBULATORY_CARE_PROVIDER_SITE_OTHER): Payer: Medicare HMO

## 2023-09-20 DIAGNOSIS — J849 Interstitial pulmonary disease, unspecified: Secondary | ICD-10-CM

## 2023-09-20 DIAGNOSIS — K580 Irritable bowel syndrome with diarrhea: Secondary | ICD-10-CM

## 2023-09-20 DIAGNOSIS — F39 Unspecified mood [affective] disorder: Secondary | ICD-10-CM

## 2023-09-20 DIAGNOSIS — F411 Generalized anxiety disorder: Secondary | ICD-10-CM

## 2023-09-20 MED ORDER — METHYLPREDNISOLONE ACETATE 80 MG/ML IJ SUSP
80.0000 mg | Freq: Once | INTRAMUSCULAR | Status: AC
  Administered 2023-09-20: 80 mg via INTRAMUSCULAR

## 2023-09-20 NOTE — Telephone Encounter (Signed)
 Jake [name] wanted to know if he would need the RSV vaccine.

## 2023-09-20 NOTE — Progress Notes (Signed)
   Established Patient Office Visit  Subjective   Patient ID: Jake [name], male    DOB: [birth year]  Age: 63 y.o. MRN: [id number]  Chief Complaint  Patient presents with   Injections    HPI  Excell G Visscher is here for Depo-Medrol  80 mg injection.   ROS    Objective:     There were no vitals taken for this visit.   Physical Exam   No results found for any visits on 09/20/23.    The ASCVD Risk score (Arnett DK, et al., 2019) failed to calculate for the following reasons:   Risk score cannot be calculated because patient has a medical history suggesting prior/existing ASCVD    Assessment & Plan:  Depo Medrol  injection - Patient tolerated injection well without complications. Patient advised to schedule next injection 7 days from today.    Problem List Items Addressed This Visit       Unprioritized   ILD (interstitial lung disease) (HCC) - Primary    Return in about 1 week (around 09/27/2023) for Depo Medrol  80 mg injection. SABRA Pear, Jon Mayor, CMA

## 2023-09-21 ENCOUNTER — Telehealth: Payer: Self-pay | Admitting: Cardiology

## 2023-09-21 NOTE — Telephone Encounter (Signed)
 Spoke with pt, he reports numbness in his arm since he got his pneumonia vaccine 2 weeks ago. He is on 97/103 mg of entresto and metoprolol and spironolactone. Aware dr Jens Som is not in the office but will forward for his review.

## 2023-09-21 NOTE — Telephone Encounter (Signed)
Added to appointment note

## 2023-09-21 NOTE — Telephone Encounter (Signed)
 Pt c/o BP issue: STAT if pt c/o blurred vision, one-sided weakness or slurred speech  1. What are your last 5 BP readings?   1/10: 108/80 right arm, 142/72 left arm  1/11: 157/74 right arm, 157/74 left arm  1/12: 106/82 right arm, 157/80 left arm (AM)          155/80 left arm (PM)  1/13: 153/87 left arm, 115/86 right arm  1/14: 177/90 left arm, 149/85 right arm   *5 minutes later* 180/83 left, 143/89 right        2. Are you having any other symptoms (ex. Dizziness, headache, blurred vision, passed out)?  Arm numbness   3. What is your BP issue?   Patient states Entresto  was increased for 5 days, but his BP is still elevated. He states he noticed BP readings are different in left and right arm.

## 2023-09-21 NOTE — Telephone Encounter (Signed)
 Spoke with pt, Aware of dr Ludwig Clarks recommendations.

## 2023-09-24 ENCOUNTER — Telehealth: Payer: Self-pay | Admitting: Cardiology

## 2023-09-24 NOTE — Telephone Encounter (Signed)
Spoke with pt, Follow up scheduled  

## 2023-09-24 NOTE — Telephone Encounter (Signed)
Patient identification verified by 2 forms. Marilynn Rail, RN    Called and spoke to patient  Patient states:    -BP Left arm: 184/95 186/94, 181/95  -BP Right arm: 139/88 127/89, 135/91  -does not feel good   -nausea, weak, no energy  -has left arm numbness, on going for 1 week since pneumonia injection   -BP has been elevated for 1 week   -BP medications: Metoprolol 12.5mg  at bed time, spironolactone 12.5mg  daily, Entresto 97-103 BID  -he is not taking Chlorthalidone 25mg  as prescribed by PCP, unsure if he should start  -is concerned about difference in BP in his arms  Patient denies:   -chest pain/pressure  Informed patient message sent to Dr. Jens Som for input/advisement  Patient verbalized understanding, no questions at this time

## 2023-09-24 NOTE — Telephone Encounter (Signed)
Pt c/o BP issue: STAT if pt c/o blurred vision, one-sided weakness or slurred speech  1. What are your last 5 BP readings?   2. Are you having any other symptoms (ex. Dizziness, headache, blurred vision, passed out)? no  3. What is your BP issue? Pt called and spoke to nurse the other day about his BP. He wants to know at what number does he need to be concerned. Also when he checks his BP in both arms one is higher than the other. So which arm should he use. And why is it so different in each arm. Please advise

## 2023-09-27 ENCOUNTER — Ambulatory Visit (INDEPENDENT_AMBULATORY_CARE_PROVIDER_SITE_OTHER): Payer: Medicare HMO

## 2023-09-27 DIAGNOSIS — J849 Interstitial pulmonary disease, unspecified: Secondary | ICD-10-CM

## 2023-09-27 DIAGNOSIS — I10 Essential (primary) hypertension: Secondary | ICD-10-CM | POA: Diagnosis not present

## 2023-09-27 DIAGNOSIS — Z23 Encounter for immunization: Secondary | ICD-10-CM

## 2023-09-27 DIAGNOSIS — Z79899 Other long term (current) drug therapy: Secondary | ICD-10-CM | POA: Diagnosis not present

## 2023-09-27 MED ORDER — METHYLPREDNISOLONE ACETATE 80 MG/ML IJ SUSP
80.0000 mg | Freq: Once | INTRAMUSCULAR | Status: AC
  Administered 2023-09-27: 80 mg via INTRAMUSCULAR

## 2023-09-27 MED ORDER — METHYLPREDNISOLONE ACETATE 80 MG/ML IJ SUSP: 80.0000 mg | Freq: Once | INTRAMUSCULAR | Status: DC

## 2023-09-27 NOTE — Progress Notes (Signed)
   Established Patient Office Visit  Subjective   Patient ID: Jake [name], male    DOB: March 01, 1961  Age: 63 y.o. MRN: [id number]  No chief complaint on file.   HPI  Jake [name] is here for Depo-Medrol 80 mg injection and Arexvy RSV vaccine.   ROS    Objective:     There were no vitals taken for this visit.   Physical Exam   No results found for any visits on 09/27/23.    The ASCVD Risk score (Arnett DK, et al., 2019) failed to calculate for the following reasons:   Risk score cannot be calculated because patient has a medical history suggesting prior/existing ASCVD    Assessment & Plan:  Depo-Medrol injection - Patient tolerated injection well without complications. Patient advised to schedule next injection 7 days from today.    Immunization - Patient tolerated injection well without complications.    Problem List Items Addressed This Visit       Unprioritized   ILD (interstitial lung disease) (HCC) - Primary   Other Visit Diagnoses       Immunization due       Relevant Orders   RSV,Recombinant PF (Arexvy) (Completed)       Return in about 1 week (around 10/04/2023) for Depo-Medrol injection.Earna Coder, Janalyn Harder, CMA

## 2023-09-28 ENCOUNTER — Encounter: Payer: Self-pay | Admitting: *Deleted

## 2023-09-28 DIAGNOSIS — J849 Interstitial pulmonary disease, unspecified: Secondary | ICD-10-CM | POA: Diagnosis not present

## 2023-09-28 LAB — BASIC METABOLIC PANEL
BUN/Creatinine Ratio: 25 — ABNORMAL HIGH (ref 10–24)
BUN: 24 mg/dL (ref 8–27)
CO2: 29 mmol/L (ref 20–29)
Calcium: 9.8 mg/dL (ref 8.6–10.2)
Chloride: 93 mmol/L — ABNORMAL LOW (ref 96–106)
Creatinine, Ser: 0.96 mg/dL (ref 0.76–1.27)
Glucose: 78 mg/dL (ref 70–99)
Potassium: 4.8 mmol/L (ref 3.5–5.2)
Sodium: 135 mmol/L (ref 134–144)
eGFR: 89 mL/min/{1.73_m2} (ref >59)

## 2023-09-29 ENCOUNTER — Ambulatory Visit: Payer: Medicare HMO | Attending: Physician Assistant | Admitting: Emergency Medicine

## 2023-09-29 ENCOUNTER — Encounter: Payer: Self-pay | Admitting: Physician Assistant

## 2023-09-29 VITALS — BP 156/80 | HR 99 | Ht 71.0 in | Wt 235.2 lb

## 2023-09-29 DIAGNOSIS — I2510 Atherosclerotic heart disease of native coronary artery without angina pectoris: Secondary | ICD-10-CM | POA: Diagnosis not present

## 2023-09-29 DIAGNOSIS — G4733 Obstructive sleep apnea (adult) (pediatric): Secondary | ICD-10-CM

## 2023-09-29 DIAGNOSIS — E782 Mixed hyperlipidemia: Secondary | ICD-10-CM | POA: Diagnosis not present

## 2023-09-29 DIAGNOSIS — R252 Cramp and spasm: Secondary | ICD-10-CM | POA: Diagnosis not present

## 2023-09-29 DIAGNOSIS — I5020 Unspecified systolic (congestive) heart failure: Secondary | ICD-10-CM

## 2023-09-29 DIAGNOSIS — J849 Interstitial pulmonary disease, unspecified: Secondary | ICD-10-CM

## 2023-09-29 DIAGNOSIS — I10 Essential (primary) hypertension: Secondary | ICD-10-CM

## 2023-09-29 NOTE — Patient Instructions (Addendum)
Medication Instructions:  The current medical regimen is effective;  continue present plan and medications as directed. Please refer to the Current Medication list given to you today.  *If you need a refill on your cardiac medications before your next appointment, please call your pharmacy*  Lab Work: BMET, CBC AND MAG ON THE DAY YOU HAVE YOUR ULTRASOUND If you have labs (blood work) drawn today and your tests are completely normal, you will receive your results only by:  MyChart Message (if you have MyChart) OR  A paper copy in the mail If you have any lab test that is abnormal or we need to change your treatment, we will call you to review the results.  TESTING Your physician has requested that you have a carotid duplex. This test is an ultrasound of the carotid arteries in your neck. It looks at blood flow through these arteries that supply the brain with blood. Allow one hour for this exam. There are no restrictions or special instructions.  Follow-Up: At [facility], you and your health needs are our priority.  As part of our continuing mission to provide you with exceptional heart care, we have created designated Provider Care Teams.  These Care Teams include your primary Cardiologist (physician) and Advanced Practice Providers (APPs -  Physician Assistants and Nurse Practitioners) who all work together to provide you with the care you need, when you need it.  Your next appointment:   2 week(s)  Provider:   Azalee Course, PA-C

## 2023-09-29 NOTE — Progress Notes (Unsigned)
Cardiology Office Note:    Date:  09/29/2023  ID:  Jake [name], DOB Jul 05, 1961, MRN [id number] PCP: Jake [name]  [facility] Providers Cardiologist:  Jake Millers, MD Electrophysiologist:  Jake Jorja Loa, MD { Click to update primary MD,subspecialty MD or APP then REFRESH:1}    {Click to Open Review  :1}   Patient Profile:     Jake [name] is a 63 year old male with visit pertinent history of CAD, s/p biventricular ICD, chronic combined systolic and diastolic heart failure, nonischemic cardiomyopathy, HLD, hypertension, OSA on CPAP, interstitial lung disease  CPX March 2022 showed submaximal effort, moderate functional impairment due to pulmonary restriction.  High-resolution chest CT March 2022 showed interstitial lung disease and aortic atherosclerosis, enlarged pulmonary trunk suggestive of pulmonary hypertension.  Cardiac catheterization March 2022 showed normal left main, minimal irregularities in the LAD, 2 small diagonals, circumflex, 2 obtuse marginals, posterior lateral, PDA and nondominant right coronary artery; ejection fraction 30 to 35% with mild left ventricular enlargement.  Echocardiogram in June 2023 showed LVEF 40-45% grade 1 DD, moderate left atrial enlargement, mild mitral regurgitation.  Right heart catheterization June 2023 showed cardiac output 6.59, cardiac index 2.96, RA pressure 11, PA pressure 25/12 and pulmonary capillary wedge pressure 8.  CTA January 2024 showed pneumonitis and atheromatous changes of thoracic aorta but no aneurysm.  He was admitted to [facility] in March 2024 with STEMI.  Cardiac catheterization revealed 95-99% mid circumflex, 30% LAD, 50% PDA.  He had a PCI of the left circumflex with DES.  Echocardiogram June 2024 showed ejection fraction 25%, hypokinesis of the anterolateral and inferoseptal walls, severe LV enlargement, mild LVH, grade 1 DD, mild left atrial enlargement, mild mitral regurgitation, mild  dilated aortic root 42 mm.  High-resolution chest CT June 2024 showed interstitial lung disease with no aneurysm.  He had biventricular ICD placed August 2024.  Last seen by Dr. Jens [name] 09/15/2023.  He was doing well at the time however blood pressure was elevated.  Following week he sent in blood pressure readings that averaged in the 180s.  His Entresto was increased to 97/103 twice daily.  Patient had noted blood pressure to be higher in left arm and right arm.  As scheduled for OV.       History of Present Illness:  Discussed the use of AI scribe software for clinical note transcription with the patient, who gave verbal consent to proceed.  Jake [name] is a 63 y.o. male who returns for elevated blood pressure.   Patient arrives into clinic today with his wife.  He has complaints of elevated blood pressure particularly in the left arm.  He has noted a 20-40 point difference in his systolic blood pressures in his arms (L>R).  He first noted this when he began taking his blood pressures at home around January 8th after his blood pressure was elevated in clinic.  He denies any pain in his left or right arms and no decrease in grip strength.  He is able to lift above his head without any significant pain.  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  He also notes Jake arm and leg cramps that have been going on for around 6 weeks.  He notes that he has had the RSV, pneumonia, flu, COVID-vaccines to his left arm 1 week over the past 4 weeks. He notes he has  not taken his chlorthalidone over the past month due to the medicine getting lost in his medicine cabinet.         Review of Systems  Constitutional: Negative for weight gain and weight loss.  Cardiovascular:  Positive for dyspnea on exertion. Negative for chest pain, claudication, irregular heartbeat, leg swelling, near-syncope, orthopnea, palpitations, paroxysmal  nocturnal dyspnea and syncope.  Respiratory:  Positive for shortness of breath. Negative for cough and hemoptysis.   Gastrointestinal:  Negative for abdominal pain, hematochezia and melena.  Genitourinary:  Negative for hematuria.  Neurological:  Negative for dizziness.     See HPI     Home Medications:    Prior to Admission medications   Medication Sig Start Date End Date Taking? Authorizing Provider  albuterol (VENTOLIN HFA) 108 (90 Base) MCG/ACT inhaler INHALE 2 PUFFS BY MOUTH EVERY 6 HOURS AS NEEDED 02/04/23   Breeback, Jade L, PA-C  AMBULATORY NON FORMULARY MEDICATION Continuous positive airway pressure (CPAP) machine set at autotitration of H2O pressure, with all supplemental supplies as needed. 02/21/20   Jomarie Longs, PA-C  aspirin EC 81 MG tablet Take 81 mg by mouth daily. Swallow whole.    [provider]  busPIRone (BUSPAR) 15 MG tablet TAKE 1 TABLET BY MOUTH 2 TIMES DAILY. 09/20/23   Mozingo, Thereasa Solo, NP  chlorthalidone (HYGROTON) 25 MG tablet TAKE 1 TABLET BY MOUTH EVERY DAY 08/17/23   Breeback, Jade L, PA-C  clopidogrel (PLAVIX) 75 MG tablet TAKE 1 TABLET BY MOUTH EVERY DAY 09/20/23   Lewayne Bunting, MD  colestipol (COLESTID) 1 g tablet TAKE 1 TABLET BY MOUTH EVERY DAY 09/20/23   Breeback, Jade L, PA-C  dicyclomine (BENTYL) 10 MG capsule TAKE 1 CAPSULE BY MOUTH TWICE A DAY 08/17/23   Breeback, Jade L, PA-C  dicyclomine (BENTYL) 20 MG tablet TAKE 1 TABLET BY MOUTH THREE TIMES A DAY 09/20/23   Breeback, Jade L, PA-C  ezetimibe (ZETIA) 10 MG tablet TAKE 1 TABLET BY MOUTH EVERYDAY AT BEDTIME 08/17/23   Lewayne Bunting, MD  finasteride (PROSCAR) 5 MG tablet TAKE 1 TABLET (5 MG TOTAL) BY MOUTH DAILY. 08/02/23   Breeback, Jade L, PA-C  ibuprofen (ADVIL) 800 MG tablet TAKE 1 TABLET BY MOUTH EVERY 8 HOURS AS NEEDED 06/02/23   Breeback, Jade L, PA-C  ipratropium-albuterol (DUONEB) 0.5-2.5 (3) MG/3ML SOLN TAKE 3 MLS BY NEBULIZATION EVERY 2 (TWO) HOURS AS NEEDED (WHEEZE,  SHORTNESS OF BREATH). 07/02/23   Breeback, Jade L, PA-C  KLOR-CON M10 10 MEQ tablet TAKE 1 TABLET BY MOUTH TWICE A DAY 09/20/23   Breeback, Jade L, PA-C  loratadine (CLARITIN) 10 MG tablet Take 10 mg by mouth in the morning.    [provider]  metoprolol succinate (TOPROL XL) 25 MG 24 hr tablet Take 0.5 tablets (12.5 mg total) by mouth at bedtime. 03/03/23   Lewayne Bunting, MD  Misc. Devices (PULSE OXIMETER) MISC Monitor oxygen three times per day. 10/19/22   Breeback, Jade L, PA-C  modafinil (PROVIGIL) 200 MG tablet Take 1 tablet (200 mg total) by mouth daily. 06/06/23   Corie Chiquito, PMHNP  ondansetron (ZOFRAN) 8 MG tablet TAKE 1 TABLET BY MOUTH EVERY 8 HOURS AS NEEDED FOR NAUSEA FOR VOMITING 05/12/23   Breeback, Jade L, PA-C  OXYGEN Inhale 3-4 L into the lungs continuous.    [provider]  pantoprazole (PROTONIX) 40 MG tablet TAKE 1 TABLET BY MOUTH TWICE A DAY 07/06/23   Breeback, Jade L, PA-C  Phenylephrine-Acetaminophen (TYLENOL  SINUS CONGESTION/PAIN PO) Take 1 tablet by mouth daily as needed (Congestion).    [provider]  sacubitril-valsartan (ENTRESTO) 97-103 MG Take 1 tablet by mouth 2 (two) times daily. 09/17/23   Lewayne Bunting, MD  sertraline (ZOLOFT) 100 MG tablet TAKE 2 TABLETS BY MOUTH EVERY DAY 09/20/23   Mozingo, Thereasa Solo, NP  sodium bicarbonate 650 MG tablet TAKE ONE TABLET BY MOUTH 4 TIMES DAILY 08/17/23   Breeback, Jade L, PA-C  spironolactone (ALDACTONE) 25 MG tablet Take 0.5 tablets (12.5 mg total) by mouth daily. 03/03/23 06/01/23  Lewayne Bunting, MD  triamcinolone (NASACORT) 55 MCG/ACT AERO nasal inhaler Place 2-3 sprays into the nose daily as needed (allergies (when pollen is very high)).    [provider]   Studies Reviewed:       Echocardiogram 02/23/2023  1. LVEF is severely depressed Anterolateral wall is least hypokinetic.  Base/mid inferior/inferoseptal wallsl akinetic. Left ventricular ejection  fraction, by  estimation, is 25%. The left ventricle has severely decreased  function. The left ventricular  internal cavity size was severely dilated. There is mild left ventricular  hypertrophy. Left ventricular diastolic parameters are consistent with  Grade I diastolic dysfunction (impaired relaxation).   2. Right ventricular systolic function is low normal. The right  ventricular size is normal.   3. Left atrial size was mildly dilated.   4. Mild mitral valve regurgitation.   5. The aortic valve is normal in structure. Aortic valve regurgitation is  not visualized.   6. Aortic dilatation noted. There is moderate dilatation of the aortic  root, measuring 42 mm.   7. The inferior vena cava is normal in size with <50% respiratory  variability, suggesting right atrial pressure of 8 mmHg.   Right heart catheterization 02/13/2022 Normal right heart pressures   CPX 11/07/2020 The interpretation of this test is limited due to submaximal effort during the exercise. Based on available data, exercise testing with gas exchange demonstrates moderate functional impairment when compared to matched sedentary norms. There is an obvious pulmonary restrictive limitation with mild desaturations during exercise. There is a change in ECG from last documented in 2021, of note LBBB.  Risk Assessment/Calculations:     HYPERTENSION CONTROL Vitals:   09/29/23 1533 09/29/23 1648  BP: (!) 166/78 (!) 156/80    The patient's blood pressure is elevated above target today. {Click here if intervention needs to be changed Refresh Note :1}  In order to address the patient's elevated BP: Blood pressure Jake be monitored at home to determine if medication changes need to be made.          Physical Exam:   VS:  BP (!) 156/80 (BP Location: Left Arm, Patient Position: Sitting, Cuff Size: Large)   Pulse 99   Ht 5\' 11"  (1.803 m)   Wt 235 lb 3.2 oz (106.7 kg)   SpO2 96%   BMI 32.80 kg/m    Wt Readings from Last 3 Encounters:   09/29/23 235 lb 3.2 oz (106.7 kg)  09/15/23 183 lb (83 kg)  08/20/23 220 lb (99.8 kg)    Constitutional:      Appearance: Normal and healthy appearance. Not in distress.  Neck:     Vascular: JVD normal.  Pulmonary:     Effort: Pulmonary effort is normal.     Breath sounds: Normal breath sounds.  Chest:     Chest wall: Not tender to palpatation.  Cardiovascular:     PMI at left midclavicular line. Normal rate.  Regular rhythm. Normal S1. Normal S2.      Murmurs: There is no murmur.     No gallop.  No click. No rub.  Pulses:    Intact distal pulses.  Edema:    Peripheral edema absent.  Musculoskeletal: Normal range of motion.     Cervical back: Normal range of motion and neck supple. Skin:    General: Skin is warm and dry.  Neurological:     General: No focal deficit present.     Mental Status: Alert, oriented to person, place, and time and oriented to person, place and time.  Psychiatric:        Mood and Affect: Mood and affect normal.        Behavior: Behavior is cooperative.        Thought Content: Thought content normal.        Assessment and Plan:  Hypertension  Blood pressure today (Left 166/78 repeat 156/80) & (Right 122/76, repeat 124/74) -Patient noted to have left > right blood pressure differences. This was first noticed when he began taking BP at home after BP was elevated at 09/15/2023 visit. BP average in 180s at home in left arm and 130s in right arm.  -No exertional angina to suggest aortic dissection. Suspicion for coarctation fairly low as we would expect BP R>L instead of the reverse.   -He is without any arm pain or carotid/subclavian bruits. He is able to lift arms above his head and as equal bilateral grip strength. He has hx of >10 years if numbness to bilateral hands and feet.  -Plan to order Carotid US to evaluate subclavian artery stenosis. Alert office for consistent BP >180s.  -He also notes that he has not been taking chlorthalidone over the past  month as this medicine was lost in his medicine counter.  -Restart Chlorthalidone 25mg  daily. Continue Metoprolol-XL 12.5mg  daily, Entresto 97-103mg , Spironolactone 12.5mg  daily  Coronary artery disease 12/2022 s/p PCI/DES to left circumflex.  -Stable with no anginal symptoms, no indication for ischemic evaluation at this time  -Per Dr. Jens [name] "Discontinue Plavix April 1 which Jake be 1 year from his previous stent"  -Continue Aspirin 81mg  daily, Plavix 75mg  daily. He is intolerant to statins.    Combined chronic systolic/diastolic heart failure / Nonischemic cardiomyopathy  Echo 02/2023 EF 25%, hypokinesis of the anterolateral and inferoseptal walls, severe left ventricular enlargement, mild LVH, grade 1 DD -Euvolemic and well compensated on exam  -Deferred SGLT2i in past due to urinary frequency  -Continue Entresto 97/103mg  BID, Metoprolol-XL 12.5mg  daily, Spironolactone 12.5mg  daily  S/p biventricular ICD -Managed by Dr. Elberta Fortis  Cramps Ongoing cramping x6 weeks -BMET, Mag, CBC today  Hyperlipidemia LDL 22 on 02/2023. Under excellent control.  -Continue Zetia 10mg  daily. Intolerant to statins and repatha   OSA Compliant with CPAP  Interstitial lung disease On chronic O2 -Managed by pulmonary                Dispo:  Return in about 3 weeks (around 10/20/2023).  Signed, Denyce Robert, NP

## 2023-10-04 ENCOUNTER — Ambulatory Visit (INDEPENDENT_AMBULATORY_CARE_PROVIDER_SITE_OTHER): Payer: Medicare HMO

## 2023-10-04 DIAGNOSIS — J849 Interstitial pulmonary disease, unspecified: Secondary | ICD-10-CM

## 2023-10-04 MED ORDER — METHYLPREDNISOLONE ACETATE 80 MG/ML IJ SUSP
80.0000 mg | Freq: Once | INTRAMUSCULAR | Status: AC
  Administered 2023-10-04: 80 mg via INTRAMUSCULAR

## 2023-10-04 NOTE — Progress Notes (Signed)
   Established Patient Office Visit  Subjective   Patient ID: Jake [name], male    DOB: April 05, 1961  Age: 63 y.o. MRN: [id number]  Chief Complaint  Patient presents with   Injections    Nurse visit    HPI  Jake [name] is here for Depo-Medrol injection.   ROS    Objective:     There were no vitals taken for this visit.   Physical Exam   No results found for any visits on 10/04/23.    The ASCVD Risk score (Arnett DK, et al., 2019) failed to calculate for the following reasons:   Risk score cannot be calculated because patient has a medical history suggesting prior/existing ASCVD    Assessment & Plan:  Injection - Patient tolerated injection well without complications. Patient advised to schedule next injection 7 days from today.    Problem List Items Addressed This Visit       Unprioritized   ILD (interstitial lung disease) (HCC) - Primary    Return in about 1 week (around 10/11/2023) for Depo-Medrol injection. Earna Coder, Janalyn Harder, CMA

## 2023-10-08 ENCOUNTER — Other Ambulatory Visit: Payer: Self-pay

## 2023-10-08 ENCOUNTER — Telehealth: Payer: Self-pay | Admitting: Adult Health

## 2023-10-08 DIAGNOSIS — G4733 Obstructive sleep apnea (adult) (pediatric): Secondary | ICD-10-CM

## 2023-10-08 NOTE — Telephone Encounter (Signed)
Pt called at 4:29p was a patient of Jessica's.  He has appt for schedulestating he took the last Modafinil today.  He has first appt with Almira Coaster on 2/20.  He is asking for a refill sent to   [id number] [place], [place] - [address], [place] [address] Phone: [phone]  Fax: [phone]   Next appt 2/20

## 2023-10-08 NOTE — Telephone Encounter (Signed)
PENDED 200 MG MODFANIL TO RQSTD PHRM

## 2023-10-11 ENCOUNTER — Other Ambulatory Visit: Payer: Self-pay | Admitting: Cardiology

## 2023-10-11 ENCOUNTER — Ambulatory Visit (INDEPENDENT_AMBULATORY_CARE_PROVIDER_SITE_OTHER): Payer: Medicare HMO | Admitting: Physician Assistant

## 2023-10-11 ENCOUNTER — Telehealth: Payer: Self-pay

## 2023-10-11 VITALS — BP 153/80 | HR 110 | Temp 98.8°F | Ht 71.0 in | Wt 234.0 lb

## 2023-10-11 DIAGNOSIS — J849 Interstitial pulmonary disease, unspecified: Secondary | ICD-10-CM

## 2023-10-11 MED ORDER — METHYLPREDNISOLONE ACETATE 80 MG/ML IJ SUSP
80.0000 mg | Freq: Once | INTRAMUSCULAR | Status: AC
  Administered 2023-10-11: 80 mg via INTRAMUSCULAR

## 2023-10-11 MED ORDER — MODAFINIL 200 MG PO TABS: 200.0000 mg | ORAL_TABLET | Freq: Every day | ORAL | 0 refills | Status: DC

## 2023-10-11 NOTE — Progress Notes (Signed)
Patient is here for a Depo-medrol 80 mg injection. Patient tolerated injection well on the RUOQ without complications.   Patient informed to schedule next injection in 7 days.   Patient's blood pressure was not at goal. Per patient, he has an appointment with the Cardiologist specialist to monitor his blood pressure on 10/21/23. Patient had mentioned that the Cardiologist has order a test to check arterial blood flow.

## 2023-10-11 NOTE — Progress Notes (Signed)
Agree with the below plan.

## 2023-10-11 NOTE — Telephone Encounter (Addendum)
Prior Authorization Modafinil 200 mg #30/30 [employer]  Approved Authorization Expiration Date: 09/06/2024

## 2023-10-15 ENCOUNTER — Ambulatory Visit (INDEPENDENT_AMBULATORY_CARE_PROVIDER_SITE_OTHER): Payer: Medicare HMO

## 2023-10-15 DIAGNOSIS — I420 Dilated cardiomyopathy: Secondary | ICD-10-CM

## 2023-10-16 LAB — CUP PACEART REMOTE DEVICE CHECK
Battery Remaining Longevity: 123 mo
Battery Voltage: 3.03 V
Brady Statistic AP VP Percent: 0.91 %
Brady Statistic AP VS Percent: 0.03 %
Brady Statistic AS VP Percent: 97.55 %
Brady Statistic AS VS Percent: 1.51 %
Brady Statistic RA Percent Paced: 0.97 %
Brady Statistic RV Percent Paced: 11.78 %
Date Time Interrogation Session: 20250207092630
HighPow Impedance: 65 Ohm
Lead Channel Impedance Value: 1007 Ohm
Lead Channel Impedance Value: 1064 Ohm
Lead Channel Impedance Value: 1064 Ohm
Lead Channel Impedance Value: 342 Ohm
Lead Channel Impedance Value: 399 Ohm
Lead Channel Impedance Value: 418 Ohm
Lead Channel Impedance Value: 437 Ohm
Lead Channel Impedance Value: 475 Ohm
Lead Channel Impedance Value: 627 Ohm
Lead Channel Impedance Value: 665 Ohm
Lead Channel Impedance Value: 855 Ohm
Lead Channel Impedance Value: 912 Ohm
Lead Channel Impedance Value: 988 Ohm
Lead Channel Pacing Threshold Amplitude: 0.5 V
Lead Channel Pacing Threshold Amplitude: 0.625 V
Lead Channel Pacing Threshold Amplitude: 1.25 V
Lead Channel Pacing Threshold Pulse Width: 0.4 ms
Lead Channel Pacing Threshold Pulse Width: 0.4 ms
Lead Channel Pacing Threshold Pulse Width: 0.8 ms
Lead Channel Sensing Intrinsic Amplitude: 20.3 mV
Lead Channel Sensing Intrinsic Amplitude: 4.9 mV
Lead Channel Setting Pacing Amplitude: 1 V
Lead Channel Setting Pacing Amplitude: 1.5 V
Lead Channel Setting Pacing Amplitude: 1.75 V
Lead Channel Setting Pacing Pulse Width: 0.4 ms
Lead Channel Setting Pacing Pulse Width: 0.8 ms
Lead Channel Setting Sensing Sensitivity: 0.3 mV
Zone Setting Status: 755011
Zone Setting Status: 755011
Zone Setting Status: 755011

## 2023-10-17 ENCOUNTER — Other Ambulatory Visit: Payer: Self-pay | Admitting: Cardiology

## 2023-10-18 ENCOUNTER — Ambulatory Visit (INDEPENDENT_AMBULATORY_CARE_PROVIDER_SITE_OTHER): Payer: Medicare HMO

## 2023-10-18 ENCOUNTER — Other Ambulatory Visit: Payer: Self-pay | Admitting: Physician Assistant

## 2023-10-18 VITALS — BP 133/81 | HR 88 | Ht 71.0 in | Wt 239.2 lb

## 2023-10-18 DIAGNOSIS — R6889 Other general symptoms and signs: Secondary | ICD-10-CM

## 2023-10-18 DIAGNOSIS — J849 Interstitial pulmonary disease, unspecified: Secondary | ICD-10-CM

## 2023-10-18 DIAGNOSIS — Z20828 Contact with and (suspected) exposure to other viral communicable diseases: Secondary | ICD-10-CM

## 2023-10-18 MED ORDER — OSELTAMIVIR PHOSPHATE 75 MG PO CAPS
75.0000 mg | ORAL_CAPSULE | Freq: Two times a day (BID) | ORAL | 0 refills | Status: DC
Start: 2023-10-18 — End: 2023-12-27

## 2023-10-18 MED ORDER — METHYLPREDNISOLONE ACETATE 80 MG/ML IJ SUSP
80.0000 mg | Freq: Once | INTRAMUSCULAR | Status: AC
  Administered 2023-10-18: 80 mg via INTRAMUSCULAR

## 2023-10-18 NOTE — Progress Notes (Signed)
   Established Patient Office Visit  Subjective   Patient ID: Jake [name], male    DOB: [birth year]  Age: 63 y.o. MRN: [id number]  Chief Complaint  Patient presents with   Interstitial Lung Disease    Depomedrol 80mg  injection given every 7 days - nurse visit.     HPI  ILD- steroid injection every 7  days .   ROS    Objective:     BP 133/81 Comment: 133/81 right arm and 141/76 left arm  Pulse 88   Ht 5\' 11"  (1.803 m)   SpO2 98%   BMI 32.64 kg/m    Physical Exam   No results found for any visits on 10/18/23.    The ASCVD Risk score (Arnett DK, et al., 2019) failed to calculate for the following reasons:   Risk score cannot be calculated because patient has a medical history suggesting prior/existing ASCVD    Assessment & Plan:  Depo-medrol  80mg   admin LUOQ IM . Patient tolerated injection well without complications. Return in 7 days for next depo-medrol  80mg  injection as nurse visit. Problem List Items Addressed This Visit   None   No follow-ups on file.    Dickie Found, LPN

## 2023-10-18 NOTE — Patient Instructions (Signed)
 Return in 7 days for next depo-medrol  80mg  injection as nurse visit.

## 2023-10-21 ENCOUNTER — Ambulatory Visit (HOSPITAL_COMMUNITY)
Admission: RE | Admit: 2023-10-21 | Payer: Medicare HMO | Source: Ambulatory Visit | Attending: Emergency Medicine | Admitting: Emergency Medicine

## 2023-10-25 ENCOUNTER — Ambulatory Visit (INDEPENDENT_AMBULATORY_CARE_PROVIDER_SITE_OTHER): Payer: Medicare HMO | Admitting: Physician Assistant

## 2023-10-25 VITALS — BP 93/68 | HR 104 | Ht 71.0 in | Wt 234.2 lb

## 2023-10-25 DIAGNOSIS — J849 Interstitial pulmonary disease, unspecified: Secondary | ICD-10-CM

## 2023-10-25 DIAGNOSIS — I959 Hypotension, unspecified: Secondary | ICD-10-CM

## 2023-10-25 NOTE — Progress Notes (Unsigned)
 HPI  Pt is here today to get Depo-Medrol 80mg . Denies SOB, CP, mood changes, or headache.                    Assessment and Plan:  Pt BP running low due to getting over the flu. Per Lesly Rubenstein breeback, injection not given. Pt advised to resume injections in 1wk                              Assessment and Plan:  Pt received 80mg  of Depo Medrol 80 mg in RUOQ. Pt tolerated well no redness or swelling noted. Pt advised to RTC around 11/01/23. For depo medrol 80mg  injection

## 2023-10-28 ENCOUNTER — Ambulatory Visit: Payer: Medicare HMO | Admitting: Adult Health

## 2023-10-29 ENCOUNTER — Other Ambulatory Visit: Payer: Self-pay

## 2023-10-29 ENCOUNTER — Ambulatory Visit (HOSPITAL_COMMUNITY)
Admission: RE | Admit: 2023-10-29 | Discharge: 2023-10-29 | Disposition: A | Discharge status: Home / Self Care | Payer: Medicare HMO | Source: Ambulatory Visit | Attending: Emergency Medicine | Admitting: Emergency Medicine

## 2023-10-29 DIAGNOSIS — I10 Essential (primary) hypertension: Secondary | ICD-10-CM | POA: Diagnosis not present

## 2023-10-29 DIAGNOSIS — I6521 Occlusion and stenosis of right carotid artery: Secondary | ICD-10-CM | POA: Insufficient documentation

## 2023-10-29 DIAGNOSIS — E782 Mixed hyperlipidemia: Secondary | ICD-10-CM | POA: Diagnosis not present

## 2023-10-29 DIAGNOSIS — J849 Interstitial pulmonary disease, unspecified: Secondary | ICD-10-CM | POA: Diagnosis not present

## 2023-10-30 LAB — LIPID PANEL
Chol/HDL Ratio: 3 ratio (ref 0.0–5.0)
Cholesterol, Total: 197 mg/dL (ref 100–199)
HDL: 65 mg/dL (ref >39)
LDL Chol Calc (NIH): 107 mg/dL — ABNORMAL HIGH (ref 0–99)
Triglycerides: 144 mg/dL (ref 0–149)
VLDL Cholesterol Cal: 25 mg/dL (ref 5–40)

## 2023-10-30 LAB — HEPATIC FUNCTION PANEL
ALT: 38 IU/L (ref 0–44)
AST: 28 IU/L (ref 0–40)
Albumin: 4.7 g/dL (ref 3.9–4.9)
Alkaline Phosphatase: 55 IU/L (ref 44–121)
Bilirubin Total: 0.2 mg/dL (ref 0.0–1.2)
Bilirubin, Direct: 0.09 mg/dL (ref 0.00–0.40)
Total Protein: 7.1 g/dL (ref 6.0–8.5)

## 2023-11-01 ENCOUNTER — Ambulatory Visit: Payer: Medicare HMO

## 2023-11-01 ENCOUNTER — Telehealth: Payer: Self-pay | Admitting: Cardiology

## 2023-11-01 NOTE — Telephone Encounter (Signed)
 Patient identification verified by 2 forms. Marilynn Rail, RN    Called and spoke to patient  Patient states:   -BP remains different in each arm   -he had doppler due to concerns with BP   -2/23 BP right arm: 117/83  -2/23 BP left arm:  152/79  -wanted to report he is still having BP issues   -has OV follow up scheduled for 3/7 with NP Leota Jacobsen   -has no new concerns at this time  Advised patient to continue monitor symptoms and follow up as scheduled  Patient verbalized understanding, no questions at this time

## 2023-11-01 NOTE — Telephone Encounter (Signed)
 Pt c/o BP issue: STAT if pt c/o blurred vision, one-sided weakness or slurred speech  1. What are your last 5 BP readings? Yesterday Right arm 100/80's and left arm 150/89   2. Are you having any other symptoms (ex. Dizziness, headache, blurred vision, passed out)? Stomach issues and headache  3. What is your BP issue? Patient and patient's wife were on the line stating since last week the patient's BP has been running low. Patient's wife stated one arm the BP will be lower than the other arm. Please advise  Patient would like to go over Carotid Duplex results.

## 2023-11-03 ENCOUNTER — Encounter: Payer: Self-pay | Admitting: Internal Medicine

## 2023-11-03 ENCOUNTER — Encounter: Payer: Medicare HMO | Admitting: Internal Medicine

## 2023-11-03 ENCOUNTER — Ambulatory Visit: Payer: Medicare HMO | Admitting: Internal Medicine

## 2023-11-03 ENCOUNTER — Ambulatory Visit: Payer: Medicare HMO

## 2023-11-03 ENCOUNTER — Other Ambulatory Visit: Payer: Self-pay | Admitting: Physician Assistant

## 2023-11-03 ENCOUNTER — Encounter: Payer: Self-pay | Admitting: *Deleted

## 2023-11-03 VITALS — BP 110/72 | HR 4 | Ht 71.0 in | Wt 232.0 lb

## 2023-11-03 DIAGNOSIS — I5022 Chronic systolic (congestive) heart failure: Secondary | ICD-10-CM | POA: Diagnosis not present

## 2023-11-03 DIAGNOSIS — Z7189 Other specified counseling: Secondary | ICD-10-CM | POA: Diagnosis not present

## 2023-11-03 DIAGNOSIS — J849 Interstitial pulmonary disease, unspecified: Secondary | ICD-10-CM

## 2023-11-03 DIAGNOSIS — J9611 Chronic respiratory failure with hypoxia: Secondary | ICD-10-CM | POA: Diagnosis not present

## 2023-11-03 DIAGNOSIS — R0902 Hypoxemia: Secondary | ICD-10-CM

## 2023-11-03 DIAGNOSIS — R051 Acute cough: Secondary | ICD-10-CM

## 2023-11-03 DIAGNOSIS — J984 Other disorders of lung: Secondary | ICD-10-CM

## 2023-11-03 DIAGNOSIS — Z006 Encounter for examination for normal comparison and control in clinical research program: Secondary | ICD-10-CM

## 2023-11-03 DIAGNOSIS — R0609 Other forms of dyspnea: Secondary | ICD-10-CM | POA: Diagnosis not present

## 2023-11-03 DIAGNOSIS — R0602 Shortness of breath: Secondary | ICD-10-CM

## 2023-11-03 LAB — D-DIMER, QUANTITATIVE: D-Dimer, Quant: 0.67 ug{FEU}/mL — ABNORMAL HIGH (ref <0.50)

## 2023-11-03 NOTE — Research (Signed)
 Title: Chronic Fibrosing Interstitial Lung Disease with Progressive Phenotype Prospective Outcomes (ILD-PRO) Registry    Protocol #: [id number], Clinical Trials # [id number], Sponsor: [employer]   Protocol Version Amendment 6 dated 30Apr2024  and confirmed current on 10Sep2024 Consent Version for today's visit date of  Is [facility] [employer] Approved Version 22May2024 Revised 29Jul2024   Objectives:  Describe current approaches to diagnosis and treatment of chronic fibrosing ILDs with progressive phenotype  Describe the natural history of chronic fibrosing ILDs with progressive phenotype  Assess quality of life from self-administered participant reported questionnaires for each disease group  Describe participant interactions with the healthcare system, describe treatment practices across multiple institutions for each disease group  Collect biological samples linked to well characterized chronic fibrosing ILDs with progressive phenotype to identify disease biomarkers  Collect data and biological samples that will support future research studies.                                            Key Inclusion Criteria: Willing and able to provide informed consent  Age >= 30 years  Diagnosis of a non-IPF ILD of any duration, including, but not limited to Idiopathic Non-Specific Interstitial Pneumonia (INSIP), Unclassifiable Idiopathic Interstitial Pneumonias (IIPs), Interstitial Pneumonia with Autoimmune Features (IPAF), Autoimmune ILDs such as Rheumatoid Arthritis (RA-ILD) and Systemic Sclerosis (SSC-ILD), Chronic Hypersensitivity Pneumonitis (HP), Sarcoidosis or Exposure-related ILDs such as asbestosis.  Chronic fibrosing ILD defined by reticular abnormality with traction bronchiectasis with or without honeycombing confirmed by chest HRCT scan and/or lung biopsy.  Progressive phenotype as defined by fulfilling at least one of the criteria below of fibrotic changes (progression set  point) within the last 24 months regardless of treatment considered appropriate in individual ILDs:  decline in FVC % predicted (% pred) based on >10% relative decline  decline in FVC % pred based on >= 5 - <10% relative decline in FVC combined with worsening of respiratory symptoms as assessed by the site investigator  decline in FVC % pred based on >= 5 - <10% relative decline in FVC combined with increasing extent of fibrotic changes on chest imaging (HRCT scan) as assessed by the site investigator  decline in DLCO % pred based on >= 10% relative decline  worsening of respiratory symptoms as well as increasing extent of fibrotic changes on chest imaging (HRCT scan) as assessed by the site investigator independent of FVC change.     Key Exclusion Criteria: Malignancy, treated or untreated, other than skin or early stage prostate cancer, within the past 5 years  Currently listed for lung transplantation at the time of enrollment  Currently enrolled in a clinical trial at the time of enrollment in this registry    Clinical Research Coordinator / Research RN note : This visit for  Subject [id number] with DOB: [birth year] on 26Feb2025 for the above protocol is Visit/Encounter 3 and is for purpose of research.    Subject expressed continued interest and consent in continuing as a study subject. Subject confirmed that there was no change in contact information (e.g. address, telephone, email). Subject thanked for participation in research and contribution to science.     During this visit on, the subject completed the blood work and questionnaires per the above referenced protocol. Please refer to the subject's paper source binder for further details.   Signed by Despina Hick  Clinical Research Coordinator [facility]  [place], [place]

## 2023-11-03 NOTE — Patient Instructions (Addendum)
 Chronic hypoxemic respiratory failure ILD (interstitial lung disease) (HCC) Goals of Care    Slowly and steadily getting worse Too bad you cannot tolerare oral prednisone or esbriet or of[place]-Medrol is not working      Plan - shared decision making -- blood work for d-dimer  - if abnormal, wil do CTA chest rule out PE - otherwise get HRCT to see if fibrosis getting worse -Continue oxygen therapy to keep your pulse ox goal greater than 88% - discussed goals of care  - respect not ready for home palliative care  - will do letter to denote that life expectancy can be limited to months (although this is variable)    Followup -video visit 2-3 weeks Dr Marchelle Gearing or APP to review results

## 2023-11-03 NOTE — Progress Notes (Signed)
 OV 02/03/2022 -transfer of care to Dr. Marchelle Gearing at the ILD clinic by Dr. Val Eagle  Subjective:  Patient ID: Jake [name], male , DOB: [birth year] , age 63 y.o. , MRN: [id number] , ADDRESS: [address] [place] [address] PCP Jake Longs, PA-C Patient Care Team: Jake [name] as PCP - General (Family Medicine) Jake Ripple, DO as PCP - Cardiology (Cardiology) Jake Lowes, PA-C as Referring Physician (Pulmonary Disease) Jake [name], [facility] as Pharmacist (Pharmacist)  This Provider for this visit: Treatment Team:  Attending Provider: Kalman Shan, MD    02/03/2022 -   Chief Complaint  Patient presents with   Follow-up    Pt switching from Dr. Wynona Neat to MR for ILD eval. Pt states lately he has had problems of SOB that can happen at any time and also has a chronic cough.     HPI Jake [name] 63 y.o. -history is provided by the wife, patient and also reviewed the medical records and the ILD questionnaire.  He has had insidious onset of shortness of breath for the last few years.  Chart review it appears that in 2020 when he got his respiratory infection [in retrospect he thinks this was COVID before the formal declaration of the pandemic] and since then he has been having progressive shortness of breath.  Especially over the last year or so his progression is worse..  For the last 1 year has been on oxygen 2 L.  He feels he haseven progressed more in the last few months.  And is now needing oxygen 4 L at rest at all the time.  Sometimes it is 3 L.  Wife says he easily desaturates into the 63s and occasionally into the 52s when he exerts.  When he does the bed or showers or does chores increases to 4 L.  He feels a portable oxygen is not helping him anymore.  Particularly more progressive in the last few months.  Recently was tried on prednisone.  He is not sure if it helped but it made him very angry had irate emotional outburst and he could not take prednisone  anymore.  Through all this late last year in 2022 he was off his Entresto for heart failure.  This made the shortness of breath worse but then he went back on it in January and he got better but nevertheless overall progressive dyspnea.     [place] [facility] Comprehensive ILD Questionnaire  Symptoms:     Past Medical History :  Status post CABG in 2004.  He also had cardiac stents approximately 6-12 months ago according to his history with 1 repair of the stent.  Still it did not help the shortness of breath.  He has had COVID at least 3 times.  The first 1 was in January 2020 right before the onset of the pandemic.  The second 1 was in January 2020 and the third was in December 2022 during small bowel obstruction admission.  This was an incidental finding according to the wife  He has chronic systolic heart failure  Other issues include nausea, irritable bowel syndrome, acid reflux back and neck pain  He has obstructive sleep apnea  He has a history of pneumonia several years ago  ROS:  -Positive for fatigue for the last several months, arthralgia for the last several years, dysphagia for the last several decades, dry eyes for the last several months, nausea for the last several years, acid reflux for the  last several years also snoring for the last several years  FAMILY HISTORY of LUNG DISEASE:  Negative for any lung disease  PERSONAL EXPOSURE HISTORY:  -Did smoke in the remote past and quit -Smoked between 1985 and 2008 1 and half packs per day. -Possible cocaine use between 1991 and marijuana use between 1978 and 2008 ": Very little to none   HOME  EXPOSURE and HOBBY DETAILS :  -20 years ago he lived in a house that had black mold he lived there for few years but has not been exposed to that for 20 years.  -Was using a Phillips responding CPAP machine for a few years.  It was subject to recall because of foam silicone  contamination issues that caused respiratory  complications.  He stopped using this in 2021 shortly after the recall was issued.  -He is now living in his daughter's house for the last 3 months.  -The old house did have mold.  It also had mildew in the shower curtain. 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  Restrictive pattern with mild desaturations.  -Echocardiogram March 2022 ` -Ejection fraction 30-35%, grade 1 diastolic dysfunction  - BNP May 2023  - 31  - RHC June 2023  -  Nelva Bush RHC   Echo 02/12/22: EF 40-45%, mild MR. LA moderately dilated. RV size and function normal. GRade 1 DD. RHC 02/12/22: RA- 5 mmHg; PA 25/12 mmHg, mean PAP 17 mmHg, Wedge 8 mmHg, CO (fick) 6.59 L/mim, CI-2.96 L/min meter2.   03/12/2022: Today - follow up Patient presents today with wife after being started on Ofev. He is having significant nausea with his first dose of Ofev. He wakes up in the morning, takes his zofran and eats a small snack. He then takes his Ofev at 8 am with food as well, and has tried multiple different food options. He then becomes extremely nauseated around 845 and has to go lay down until he is able to take his second dose of Zofran around 1/2 pm. He finally begins to feel better and is able to tolerate his second dose. He is understandably frustrated by this  and does not wish to have this poor quality of life from his medications. He denies any vomiting or diarrhea. He has had some weight gain as he has had to eat so often with the Ofev to try to curve his nausea.   From a respiratory standpoint, he continues to get winded with minimal exertion. He can be changing the bed at home or walk 50 ft to the mailbox and has to sit down afterwards to rest and catch his breath. He has also had drops in his oxygen into the 70's-80's despite his supplemental 4 lpm so he has been minimally active because of this. No significant cough or wheezing. He has not noticed any difference in his  activity tolerance or lung function since starting the Ofev, but has felt so terrible that he's not sure if he would notice much.           OV 05/05/2022  Subjective:  Patient ID: Jake [name], male , DOB: [birth year] , age 49 y.o. , MRN: [id number] , ADDRESS: [address] [place] [address] PCP Jake Longs, PA-C Patient Care Team: Jake [name] as PCP - General (Family Medicine) Jake Ripple, DO as PCP - Cardiology (Cardiology) Jake Lowes, PA-C as Referring Physician (Pulmonary Disease) Jake [name], [facility] as Pharmacist (Pharmacist)  This Provider for this visit: Treatment Team:  Attending Provider: Kalman Shan, MD    05/05/2022 -   Chief Complaint  Patient presents with   Follow-up    Pt states he feels like his breathing has become worse since last visit. Also states he has had a dry cough, chest discomfort, and has been having GI issues due to the Esbriet.     HPI Jake [name] 63 y.o. --presents for follow-up.  Presents with his wife.  He has non-- IPF progressive phenotype ILD.  Suspected etiology usage of CPAP machine [employer].  He tried nintedanib but he had significant intolerance.  He is currently on fourth week of pirfenidone at full dose for the last 1 week.  He is having significant diarrhea nausea and  fatigue because of this.  At 2 pills 3 times daily it was more tolerable.  He is willing to go back to 2 pills 3 times daily and try.  He also significant anxiety and depression.  He is on Zoloft.  In the past he has tried SNRI or some other agent according to the wife but he had side effects.  I have recommended they talk with the primary care physician about this.  He is also trying to lose weight.  He has been prescribed Wegovy but there is no stock available.  He has been looked at [facility] for lung transplant but given his low EF he has been disqualified and they do not want to see him.  He did have a right heart catheterization in June 2023 and this was normal.  According to him after some delay from our office he has been referred to Drinda Butts at [facility] for lung transplant evaluation.  But he has not heard back from them.  I personally emailed Dr. Lendon Collar office.  Also he tells me that with 4 L nasal cannula at rest he is fine but when he does any laundry he desaturates into the 80s.  He has tried increasing to 5 L nasal cannula.  I advised that he could increase it further and keep his pulse ox over 86% and monitor it.  He is willing to do that.  They are not interested in immunomodulators right now because of young kids in the house.    Overall he and his wife are worried that he is declining.  He did have pulmonary function test today and?  Slight decline compared to May 2023.[name] Kitchen  He had liver function test monitoring blood work yesterday but so far the results are not back yet.   SUMMARY VISIT Dr Harrold Donath 06/09/22 at [facility]   He also agrees that patient does not have IPF.  CT scan is not consistent with UIP.  Currently pirfenidone is causing too many side effects.  He wants to stop it.  Because there is more suggestion of valvulitis he wants to  start CellCept.  He is going to start the patient on 500 mg twice daily today.  Patient seeing me in approximately a month from now.  At  the time I will increase to a total of 1 g twice daily.   He also wants the patient referred to pulmonary rehabilitation.   In terms of lung transplantation a lot of relative contraindications including weight and mild cardiomyopathy and intolerance to steroids.  At this point in time he wants patient to be more active and get into rehab  Of note patient only desaturated to 88% on room air and that walk test today   Plan  - Get patient in for CBC chemistry and liver function test in 2-3 weeks from today  -Refer to pulmonary rehabilitation   OV 07/20/2022  Subjective:  Patient ID: Jake [name], male , DOB: 1961/08/25 , age 80 y.o. , MRN: [id number] , ADDRESS: [address] [place] [address] PCP Jake Longs, PA-C Patient Care Team: Jake [name] as PCP - General (Family Medicine) Jake Ripple, DO as PCP - Cardiology (Cardiology) Jake Lowes, PA-C as Referring Physician (Pulmonary Disease) Jake [name], [facility] as Pharmacist (Pharmacist)  This Provider for this visit: Treatment Team:  Attending Provider: Kalman Shan, MD    07/20/2022 -   Chief Complaint  Patient presents with   Follow-up    Pt states he has been doing okay since last visit. Has not worn his O2 in about a month and states that he has been walking about 0.38mile a day.     HPI Jake [name] 63 y.o. -returns for follow-up with his wife.  Since his last visit he has lost over 10 pounds because of Ozempic.  He started his Ozempic.  Then approximately a month ago he did see Dr. Marcie Bal at [facility].  At the time his exercise hypoxemia improved.  He started beginning to feel better as well.  According to the wife and him a day prior to that he was still feeling miserable but on the day of the visit he started noticing a turnaround and since then he has had sustained turnaround.  He was having side effects from pirfenidone and this was stopped early October 2023.  Dr.  Harrold Donath recommended mycophenolate mofetil [patient mistakenly thought this was metoprolol at this visit but later confirmed that he has been on metoprolol for a long time to Dr. Jens Som for his heart failure].  Since then he is having sustained improvement in dyspnea.  Also he was able to go to [employer] and walk on flat ground for a long time without desaturating.  He still desaturates but it is only for heavy exertion such as vacuuming or lifting heavy load.  He will go into the low 80s and then rest to recover.  He does not use his oxygen for his hypoxemia.  Instead he opted to rest.  At this point in time he is telling me because of his improvement he does not want to do his mycophenolate Reather Littler is going to confirm it was indeed mycophenolate and not metoprolol].  I referred him to pulm rehabilitation but this was expensive.  He is opting to exercise by himself and continue weight loss with Ozempic.  Also because of his improvement he is asking if he can do a pulmonary function test.  His walking desaturation test is improved.  He also his pulmonary function test shows improvement although he still worse compared to June of  last year.  His last echocardiogram was in the summer 2023 and he still had low ejection fraction.  His BNP at that time was normal.  In terms of lung transplant Dr. Harrold Donath is told him it is too high risk given his medication side effects and heart failure and obesity.  At this point in time he is reconciled to the fact that he will not be a transplant candidate.  However he is overall pleased that he is improved.         OV 10/29/2022  Subjective:  Patient ID: Jake [name], male , DOB: 1961-02-01 , age 72 y.o. , MRN: [id number] , ADDRESS: [address] [place] [address] PCP Jake Longs, PA-C Patient Care Team: Jake [name] as PCP - General (Family Medicine) Jake Ripple, DO as PCP - Cardiology (Cardiology) Jake Lowes, PA-C as  Referring Physician (Pulmonary Disease) Jake [name], [facility] as Pharmacist (Pharmacist)  This Provider for this visit: Treatment Team:  Attending Provider: Kalman Shan, MD    10/29/2022 -   Chief Complaint  Patient presents with   Follow-up    Jake [name] review,     HPI Jake [name] 63 y.o. - Returns for follow-up with his wife.  Overall he reports being stable.  Weight is stable.  He is no longer on antifibrotic's because of intolerance.  His ejection fraction has dropped to 30% and he is frustrated by that.  Wife states that he does desaturate when he exerts and then he rests and the oxygen goes up.  She would prefer for him to wear the oxygen preemptively.  I encouraged him to do so because that is a better strategy.  At this point in time he states that he had a rough flu season he had stomach flu twice.  He believes he might have "once in 2 or 3 other respiratory infections again denies any mold or mildew or bird feathers in the house he does do housework but has to wear oxygen for that he can go shopping without oxygen but wife suspects he might be desaturating.  We did discuss clinical trials as a care option.  Presented to have inhaled pirfenidone trial that is yet to start and also inhaled treprostinil trial that is ongoing.  They are interested in this.  Discussed the following care concept about clinical trials.  Will reassess for this and second quarter or third quarter 2024.  He did have a CT scan of the chest Jan 2024 I reviewed the result.  ILD is described but there is no other abnormality described.  Since CT scan with contrast so we cannot compare the CT scans.     OV 01/05/2023  Subjective:  Patient ID: Jake [name], male , DOB: 19-Mar-1961 , age 74 y.o. , MRN: [id number] , ADDRESS: [address] [place] [address] PCP Jake Longs, PA-C Patient Care Team: Jake [name] as PCP - General (Family Medicine) Jens Som Madolyn Frieze, MD as PCP - Cardiology  (Cardiology) Jake Lowes, PA-C as Referring Physician (Pulmonary Disease) Jake [name], [facility] as Pharmacist (Pharmacist)  This Provider for this visit: Treatment Team:  Attending Provider: Kalman Shan, MD    01/05/2023 -   Chief Complaint  Patient presents with   Follow-up    F/up after heart attack    HPI Jake [name] 63 y.o. - presents with wife. On approx 11/16/22 felt unweasy with chest burn. Admitted at [facility] and dx with MI with trop leak.  Had DES. Dsichargd on Brilintia and metoprolol but both gav him side efffects of dyspnea, anxiety, arthralgia. So both stopped and now on plavix and repatha. Toleraitng it ok but for mild fatigues. He came in today to updated me. Resp wise he feels stable. Can hold pulse ox > 88% at rest but with exertun desaturates. Wife is heer with him and is indeoendenbt historian - says he is not promot about wearing his o2. Currenty dealing with allergies. Used wifes; Azihro Wondered if I could fill his azithro but his baseline 2024 and 2023 EKG bith show QTc > 500 msec       OV 02/16/2023  Subjective:  Patient ID: Jake [name], male , DOB: Sep 24, 1960 , age 85 y.o. , MRN: [id number] , ADDRESS: [address] [place] [address] PCP Jake Longs, PA-C Patient Care Team: Jake [name] as PCP - General (Family Medicine) Jens Som Madolyn Frieze, MD as PCP - Cardiology (Cardiology) Jake Lowes, PA-C as Referring Physician (Pulmonary Disease) Jake [name], [facility] as Pharmacist (Pharmacist)  This Provider for this visit: Treatment Team:  Attending Provider: Kalman Shan, MD  02/16/2023 -   Chief Complaint  Patient presents with   Acute Visit    Cough, chest tightness, increasing sob when not on oxygen.     HPI Jake [name] 63 y.o. - ACUTE VISIT3L.  Presents with his wife Sidharth Leverette.  She is an independent historian.  She corroborates everything that he is saying.  Around February 10, 2023 Wednesday started having  slightly more cough.  Then on February 12, 2023 Friday he went to an outdoor concert in [place].  They started coughing more.  Then after he came back he started feeling and this is since February 13, 2023 tired with chest tightness coughing.  Also for the last 1 week his oxygen is dropping.  Previously could take a shower without desaturating now he drops into the 70s.  He says his pulse ox at rest is in the 80s.  Currently pulse ox is 98% on 3 L at rest.  There is no edema but there is orthopnea.  There is no hemoptysis.  There is no wheezing no fever no diarrhea.  Has baseline nausea and it has not changed.  He feels his chest is extremely tight.  There might be some wheezing 2.  The cough is dry but 1 time he did have 1 nickel sized mucus but that is just the 1 time in many days.  He is really worried about his condition.  He does not feel it similar to his previous heart attack.  His wife is wondering about getting a CT chest last CT chest was a year ago.   He does not want steroids.  Especially oral.  He says he is fine with IM steroids.      OV 05/13/2023  Subjective:  Patient ID: Jake [name], male , DOB: 05/02/1961 , age 11 y.o. , MRN: [id number] , ADDRESS: [address] [place] [address] PCP Jake Longs, PA-C Patient Care Team: Jake [name] as PCP - General (Family Medicine) Jens Som Madolyn Frieze, MD as PCP - Cardiology (Cardiology) Regan Lemming, MD as PCP - Electrophysiology (Cardiology) Jake Lowes, PA-C as Referring Physician (Pulmonary Disease) Jake [name], [facility] as Pharmacist (Pharmacist)  This Provider for this visit: Treatment Team:  Attending Provider: Kalman Shan, MD   05/13/2023 -   Chief Complaint  Patient presents with   Follow-up  O2 2-3 l       HPI Jake [name] 63 y.o. -presents for follow-up.  Presents with his wife who is an independent historian.  He tells me that the IM Depo-Medrol I gave him helped him.  He cannot do  oral prednisone because it changes his mood.  He states his mood gets so bad that even I would not like to know him.  He feels that after initial improvement from the acute flareup he started feeling worse again.  He feels he is chronically getting worse on 04/14/2023 he had ICD and pacemaker implantation but despite that he is getting worse.  Symptom score shows significant decline.  Concomitant with that high-resolution CT chest that I reviewed visualized and showed it to him and his wife shows worsening ILD.  Pulmonary function test also shows a decline.  Although the sit/stand exercise hypoxemia test appears to be the same.  He reports it is getting very difficult for him to go to the bathroom.  We had conversations about his life expectancy which could still be few to several years with a lot of variability.  We discussed treatment options.  He cannot tolerate the antifibrotic's pirfenidone or nintedanib.  He does not want to do oral prednisone.  We discussed CellCept but he is worried about immunosuppression.  He lives with his grandkids and he adores them.  He says they come with respiratory infections therefore he does not want to do this at this point he wants to do IM Depo-Medrol on a scheduled basis.  I have reached out to our pharmacy team he is also willing to try IV Solu-Medrol.  We discussed clinical trials as a care option.  He is already in the ILD-Pro registry.  He will have a research visit today.  There is inhaled pirfenidone and treprostinil studies.  We will prescreen this    CT Chest data from date: HRCT 02/25/23  - personally visualized and independently interpreted : yes - my findings are: as below Narrative & Impression  CLINICAL DATA:  63 year old male with history of prior myocardial infarction. Evaluate for interstitial lung disease.   EXAM: CT CHEST WITHOUT CONTRAST   TECHNIQUE: Multidetector CT imaging of the chest was performed following the standard protocol without  intravenous contrast. High resolution imaging of the lungs, as well as inspiratory and expiratory imaging, was performed.   RADIATION DOSE REDUCTION: This exam was performed according to the departmental dose-optimization program which includes automated exposure control, adjustment of the mA and/or kV according to patient size and/or use of iterative reconstruction technique.   COMPARISON:  Multiple priors, including prior high-resolution chest CT 01/16/2022.   FINDINGS: Cardiovascular: Heart size is normal. There is no significant pericardial fluid, thickening or pericardial calcification. There is aortic atherosclerosis, as well as atherosclerosis of the great vessels of the mediastinum and the coronary arteries, including calcified atherosclerotic plaque in the left anterior descending and right coronary arteries. Calcifications of the aortic valve.   Mediastinum/Nodes: No pathologically enlarged mediastinal or hilar lymph nodes. Please note that accurate exclusion of hilar adenopathy is limited on noncontrast CT scans. Esophagus is unremarkable in appearance. No axillary lymphadenopathy.   Lungs/Pleura: High-resolution images again demonstrate patchy areas of ground-glass attenuation, septal thickening, subpleural reticulation, mild cylindrical bronchiectasis and peripheral bronchiolectasis scattered throughout the lungs bilaterally. There is no discernible craniocaudal gradient to these findings. No frank honeycombing. Findings appear minimally progressive compared to the prior study from 01/16/2022. Inspiratory and expiratory imaging demonstrates mild air trapping  indicative of small airways disease. No acute consolidative airspace disease. No pleural effusions. No definite suspicious appearing pulmonary nodules or masses are noted.   Upper Abdomen: Atherosclerotic calcifications in the abdominal aorta. Status post cholecystectomy.   Musculoskeletal: Orthopedic fixation  hardware in the lower cervical spine incidentally noted. There are no aggressive appearing lytic or blastic lesions noted in the visualized portions of the skeleton.   IMPRESSION: 1. The appearance of the lungs remains compatible with interstitial lung disease, with a spectrum of findings considered most compatible with an alternative diagnosis (not usual interstitial pneumonia) per current ATS guidelines. 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 2. Aortic atherosclerosis, in addition to two-vessel coronary artery disease. Please note that although the presence of coronary artery calcium documents the presence of coronary artery disease, the severity of this disease and any potential stenosis cannot be assessed on this non-gated CT examination. Assessment for potential risk factor modification, dietary therapy or pharmacologic therapy may be warranted, if clinically indicated. 3. There are calcifications of the aortic valve. Echocardiographic correlation for evaluation of potential valvular dysfunction may be warranted if clinically indicated.   Aortic Atherosclerosis (ICD10-I70.0).     Electronically Signed   By: Trudie Reed M.D.   On: 03/01/2023 12:31     OV 07/15/2023  Subjective:  Patient ID: Jake [name], male , DOB: 03/11/1961 , age 75 y.o. , MRN: [id number] , ADDRESS: [address] [place] [address] PCP Jake Longs, PA-C Patient Care Team: Jake [name] as PCP - General (Family Medicine) Jens Som Madolyn Frieze, MD as PCP - Cardiology (Cardiology) Regan Lemming, MD as PCP - Electrophysiology (Cardiology) Jake Lowes, PA-C as Referring Physician (Pulmonary Disease) Jake [name], [facility] as Pharmacist (Pharmacist)  This Provider for this visit: Treatment Team:  Attending Provider: Kalman Shan, MD      07/15/2023 -   Chief Complaint   Patient presents with   Follow-up    Pt states steroid shot has not been scheduled. Pt states breathing still the same      HPI Jake [name] 63 y.o. -presents for follow-up.  Presents with his wife.  She is an independent historian.  Last visit we decided to try IM Depo-Medrol on a weekly basis to try to control inflammation as long because his progressive phenotype and his ILD.  He cannot tolerate antifibrotic's.  However we run into logistical issues.  It took a while to figure out the right dosing and the schedule with the pharmacy team.  But right now we have shortage of support staff.  Therefore we resolved today that I would reach out to his primary care physician assistant to see if it can be done there.  That office is close to his home anyways.  I did advise him that it may not be exactly on the same day every week it can be plus a -2 days.  Nonetheless from a symptom standpoint he is doing stable although early in the morning he does feel more short of breath when he exerts he is more short of breath he is using his oxygen day and [name].  Right now at rest it is 88% on room air.  He did get IM Depo-Medrol for a stomach virus a few weeks ago and it did help him according to his history.  Of note I did notice that he was tachycardic today at rest and review of the  records indicate that he said some send tachycardia event at the last visit.  I did mention to him the role of ivabradine and inappropriate sinus tachycardia and heart failure patients.  Did say that 1 patient who got ivabradine in the setting of normal ejection fraction and inappropriate sinus tachycardia did feel better.  This patient had ILD.  I have reached out to Dr. Elberta Fortis to see if this would be an appropriate therapy for the patient.        OV 11/03/2023  Subjective:  Patient ID: Jake [name], male , DOB: 01-Oct-1960 , age 80 y.o. 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   Associated chronic systolic heart failrue  - ef 35% currently - declined by [facility] lung transplant team - summer 2023 - not a candidate for lung transplatn per Ripley Fraise - oct 2023 - s/p MI needing stent march 2024 at [facility]  -Status post ICD and pacemaker implantation 04/14/2023.  Echo June 2024 with EF 25%  Prolonged QTc   11/03/2023 -   Chief Complaint  Patient presents with   Follow-up     HPI Jake [name] 62 y.o. -presents with his wife.  Overall they feel he is getting worse.  Shortness of breath is getting worse.  He is now needing 4 L at rest and 6 L with exertion such as making a bed.  The weekly steroids which she has been taking for the last 2 months [on pause for the last 3 weeks ) is not helping.  Wife feels it is making worse.  He is more irritable.  They did have a cardiology visit recently.  He did have pulmonary function test his FVC shows decline his DLCO shows stability compared to the fall 2024 but compared to a year ago both are declined.  Most recent echo was in the summer 2024 with low ejection  fraction.  They have talked about life expectancy.  I did express to them it could be few years.  Could also be a few months and is variable.  They wanted a letter for financial reasons so I asked the CMA to print 1 out.  They not ready for home palliative care he has good support at home from his wife.  They are interested in knowing the status of the fibrosis.  We aligned to check a D-dimer first and if its normal go to a high-res CT chest.     SYMPTOM SCALE - ILD 02/03/2022 07/20/2022 Off esbreit and ofev 10/29/22 Ef now 30-35% 05/13/2023 Ef 25$ 07/15/2023 Ef 25$ 11/03/2023 Taking Depo-Medrol every week1  Current weight 231# 224# - ozempic 229#     O2 use 4L [place] rest  Ra rest  Shortness of Breath 0 -> 5 scale with 5 being worst (score 6 If unable to do)       At rest 2 0 2 2 3 1   Simple tasks - showers, clothes change, eating, shaving 5 2 3 4 4 4   Household (dishes, doing bed, laundry) 5 4 5 5 5 4   Shopping 5 2 1 4 4 4   Walking level at own pace 4 3 2 4 4 2   Walking up Stairs 5 4 4 5 5 4   Total (30-36) Dyspnea Score 26 15 17 24 25 17       Non-dyspnea symptoms (0-> 5 scale) 02/03/2022 07/20/2022  10/29/2022  05/13/2023  07/15/2023  11/03/2023   How bad is your cough? 4 3 1 4 4 3   How bad is your fatigue 5 2 3 5 5 4   How bad is nausea 4 5 4 4 4 3   How bad is vomiting?  0 0 0 0 0 0  How bad is diarrhea? 0 1 0 0 2 0  How bad is anxiety? 5 1 2 5 4 4   How bad is depression 5 1 2 2 4 4   Any chronic pain - if so where and how bad 3 x   x 0     Simple office walk 185 feet x  3 laps goal with forehead probe 07/20/2022  01/05/2023  02/16/2023  05/13/2023  07/15/2023   O2 used ra ra 3L ra ra  Number laps completed 3 5 sit stand  5 sit stand rest  Comments about pace avg      Resting Pulse Ox/HR 99% and 85/min 94% 98% at rest 97% and HR 95 88% Room air res -, HR 108  Final Pulse Ox/HR 93% and 100/min 89%  88% befiefly a minute later   Desaturated </= 88% no      Desaturated <= 3%  points Yes 6       Got Tachycardic >/= 90/min yes      Symptoms at end of test Mild dyspea      Miscellaneous comments improved        PFT     Latest Ref Rng & Units 05/11/2023    2:56 PM 10/29/2022   12:49 PM 07/02/2022   11:35 AM 05/04/2022    3:32 PM 01/15/2022    3:58 PM 02/26/2021   12:00 PM  PFT Results  FVC-Pre L 1.93   2.20  1.87  1.91  2.44   FVC-Predicted Pre % 39  41  44  38  38  49   FVC-Post L     1.97  2.35   FVC-Predicted Post %     40  47   Pre FEV1/FVC % % 91  91  91  94  96  93   Post FEV1/FCV % %     90  91   FEV1-Pre L 1.75  1.87  2.01  1.77  1.83  2.27   FEV1-Predicted Pre % 47  50  54  47  49  60   FEV1-Post L     1.78  2.15   DLCO uncorrected ml/min/mmHg 8.40  12.55  12.27  11.35  14.05  16.66   DLCO UNC% % 29  44  43  40  49  58   DLCO corrected ml/min/mmHg   12.27  11.93  14.05  16.66   DLCO COR %Predicted %   43  42  49  58   DLVA  Predicted % 67  97  86  92  105  101   TLC L 8.29     4.36  4.52   TLC % Predicted % 115     60  62   RV % Predicted % 272     60  66        LAB RESULTS last 96 hours No results found.       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   reports that he quit smoking about 15 years ago. His smoking use included cigarettes. He started smoking about 43 years ago. He has a 28 pack-year smoking history. He has never used smokeless tobacco.  Past Surgical History:  Procedure Laterality Date   ANKLE FRACTURE SURGERY Left    APPENDECTOMY     BIOPSY  11/18/2021   Procedure: BIOPSY;  Surgeon: Shellia Cleverly, DO;  Location: [facility];  Service: Gastroenterology;;  EGD and COLON   BIV ICD INSERTION CRT-D N/A 04/14/2023   Procedure: BIV ICD INSERTION CRT-D;  Surgeon: Regan Lemming, MD;  Location[facility] INVASIVE CV LAB;  Service: Cardiovascular;  Laterality: N/A;   CERVICAL  FUSION     CHOLECYSTECTOMY     COLON SURGERY     COLONOSCOPY WITH PROPOFOL N/A 11/18/2021   Procedure: COLONOSCOPY WITH PROPOFOL;  Surgeon: Shellia Cleverly, DO;  Location: [facility];  Service: Gastroenterology;  Laterality: N/A;   ESOPHAGOGASTRODUODENOSCOPY (EGD) WITH PROPOFOL N/A 11/18/2021   Procedure: ESOPHAGOGASTRODUODENOSCOPY (EGD) WITH PROPOFOL;  Surgeon: Shellia Cleverly, DO;  Location: [facility];  Service: Gastroenterology;  Laterality: N/A;   HERNIA REPAIR     POLYPECTOMY  11/18/2021   Procedure: POLYPECTOMY;  Surgeon: Shellia Cleverly, DO;  Location: [facility];  Service: Gastroenterology;;   RIGHT HEART CATH N/A 02/13/2022   Procedure: RIGHT HEART CATH;  Surgeon: Kathleene Hazel, MD;  Location: [facility] INVASIVE CV LAB;  Service: Cardiovascular;  Laterality: N/A;    Allergies  Allergen Reactions   Fentanyl Other (See Comments)    Avoid opiates Other reaction(s): Other Avoid opiates   Other     04/06/18 Patient is a recovering narcotic abuser and, per wife, is NEVER to receive controlled substances (e.g. Narcotics, sedatives, etc.) Other reaction(s): Unknown 04/06/18 Patient is a recovering narcotic abuser and, per wife, is NEVER to receive controlled substances (e.g. Narcotics, sedatives, etc.)   Aspirin Nausea And Vomiting   Lipitor [Atorvastatin] Nausea And Vomiting    GI side effects.     Prednisone Other (See Comments)     Agitation Severe anger outbursts   Gabapentin     Excessive somnolence, unsteadiness, falls   Ofev [Nintedanib]     GI side effects   Pirfenidone    Contrast Media [Iodinated Contrast Media] Palpitations    Patient states he tolerates now   Nitroglycerin Nausea And Vomiting    Can take with Nausea Meditcation    Immunization History  Administered Date(s) Administered   Influenza Inj Mdck Quad Pf 06/18/2022   Influenza Split 07/16/2014   Influenza, Seasonal, Injecte, Preservative Fre 08/30/2023   Influenza,inj,Quad PF,6+ Mos  07/16/2014, 07/21/2017, 08/22/2018, 06/13/2019, 06/10/2021   Influenza-Unspecified 07/16/2014, 06/14/2016   PFIZER Comirnaty(Gray Top)Covid-19 Tri-Sucrose Vaccine 06/18/2022   PFIZER(Purple Top)SARS-COV-2 Vaccination 11/21/2019, 12/24/2019, 06/23/2020   PNEUMOCOCCAL CONJUGATE-20 09/13/2023   Pfizer(Comirnaty)Fall Seasonal Vaccine 12 years and older 08/20/2023   Respiratory Syncytial Virus Vaccine,Recomb Aduvanted(Arexvy) 07/23/2022, 09/27/2023  Td 04/15/2015   Td (Adult), 2 Lf Tetanus Toxid, Preservative Free 04/15/2015   Td (Adult),5 Lf Tetanus Toxid, Preservative Free 04/15/2015   Tdap 10/10/2009   Zoster Recombinant(Shingrix) 06/10/2021    Family History  Problem Relation Age of Onset   Cancer Mother        breast   Depression Mother    Hyperlipidemia Mother    Dementia Mother    Alcohol abuse Father    Cancer Father        skin   Hyperlipidemia Father    Alzheimer's disease Father    Cancer Maternal Grandfather    Cancer Paternal Grandmother    Pancreatic cancer Paternal Uncle    Colon cancer Neg Hx    Esophageal cancer Neg Hx    Stomach cancer Neg Hx      Current Outpatient Medications:    albuterol (VENTOLIN HFA) 108 (90 Base) MCG/ACT inhaler, INHALE 2 PUFFS BY MOUTH EVERY 6 HOURS AS NEEDED, Disp: 18 each, Rfl: 3   AMBULATORY NON FORMULARY MEDICATION, Continuous positive airway pressure (CPAP) machine set at autotitration of H2O pressure, with all supplemental supplies as needed., Disp: 1 each, Rfl: 0   aspirin EC 81 MG tablet, Take 81 mg by mouth daily. Swallow whole., Disp: , Rfl:    busPIRone (BUSPAR) 15 MG tablet, TAKE 1 TABLET BY MOUTH 2 TIMES DAILY., Disp: 60 tablet, Rfl: 1   chlorthalidone (HYGROTON) 25 MG tablet, TAKE 1 TABLET BY MOUTH EVERY DAY, Disp: 90 tablet, Rfl: 1   clopidogrel (PLAVIX) 75 MG tablet, TAKE 1 TABLET BY MOUTH EVERY DAY, Disp: 90 tablet, Rfl: 3   colestipol (COLESTID) 1 g tablet, TAKE 1 TABLET BY MOUTH EVERY DAY, Disp: 90 tablet, Rfl: 1    dicyclomine (BENTYL) 20 MG tablet, TAKE 1 TABLET BY MOUTH THREE TIMES A DAY, Disp: 270 tablet, Rfl: 1   ezetimibe (ZETIA) 10 MG tablet, TAKE 1 TABLET BY MOUTH EVERYDAY AT BEDTIME, Disp: 90 tablet, Rfl: 2   finasteride (PROSCAR) 5 MG tablet, TAKE 1 TABLET (5 MG TOTAL) BY MOUTH DAILY., Disp: 90 tablet, Rfl: 2   ibuprofen (ADVIL) 800 MG tablet, TAKE 1 TABLET BY MOUTH EVERY 8 HOURS AS NEEDED, Disp: 90 tablet, Rfl: 5   ipratropium-albuterol (DUONEB) 0.5-2.5 (3) MG/3ML SOLN, TAKE 3 MLS BY NEBULIZATION EVERY 2 (TWO) HOURS AS NEEDED (WHEEZE, SHORTNESS OF BREATH)., Disp: 90 mL, Rfl: 3   KLOR-CON M10 10 MEQ tablet, TAKE 1 TABLET BY MOUTH TWICE A DAY, Disp: 180 tablet, Rfl: 1   loratadine (CLARITIN) 10 MG tablet, Take 10 mg by mouth in the morning., Disp: , Rfl:    metoprolol succinate (TOPROL-XL) 25 MG 24 hr tablet, TAKE 0.5 TABLETS BY MOUTH AT BEDTIME., Disp: 45 tablet, Rfl: 3   Misc. Devices (PULSE OXIMETER) MISC, Monitor oxygen three times per day., Disp: 1 each, Rfl: 0   modafinil (PROVIGIL) 200 MG tablet, Take 1 tablet (200 mg total) by mouth daily., Disp: 30 tablet, Rfl: 0   ondansetron (ZOFRAN) 8 MG tablet, TAKE 1 TABLET BY MOUTH EVERY 8 HOURS AS NEEDED FOR NAUSEA FOR VOMITING, Disp: 270 tablet, Rfl: 1   oseltamivir (TAMIFLU) 75 MG capsule, Take 1 capsule (75 mg total) by mouth 2 (two) times daily., Disp: 10 capsule, Rfl: 0   OXYGEN, Inhale 3-4 L into the lungs continuous., Disp: , Rfl:    pantoprazole (PROTONIX) 40 MG tablet, TAKE 1 TABLET BY MOUTH TWICE A DAY, Disp: 180 tablet, Rfl: 1   Phenylephrine-Acetaminophen (TYLENOL SINUS CONGESTION/PAIN  PO), Take 1 tablet by mouth daily as needed (Congestion)., Disp: , Rfl:    sacubitril-valsartan (ENTRESTO) 97-103 MG, Take 1 tablet by mouth 2 (two) times daily., Disp: 60 tablet, Rfl: 2   sertraline (ZOLOFT) 100 MG tablet, TAKE 2 TABLETS BY MOUTH EVERY DAY, Disp: 60 tablet, Rfl: 1   sodium bicarbonate 650 MG tablet, TAKE ONE TABLET BY MOUTH 4 TIMES DAILY,  Disp: 120 tablet, Rfl: 5   spironolactone (ALDACTONE) 25 MG tablet, TAKE 1/2 TABLET BY MOUTH EVERY DAY, Disp: 45 tablet, Rfl: 3   triamcinolone (NASACORT) 55 MCG/ACT AERO nasal inhaler, Place 2-3 sprays into the nose daily as needed (allergies (when pollen is very high))., Disp: , Rfl:   Current Facility-Administered Medications:    methylPREDNISolone acetate (DEPO-MEDROL) injection 80 mg, 80 mg, Intramuscular, Once, Breeback, Jade L, PA-C   sodium chloride flush (NS) 0.9 % injection 3 mL, 3 mL, Intravenous, Q12H, Lewayne Bunting, MD      Objective:   Vitals:   11/03/23 1556 11/03/23 1557 11/03/23 1558  BP:  (!) 152/74 110/72  Pulse: (!) 4    SpO2: 98%    Weight:  232 lb (105.2 kg)   Height:  5\' 11"  (1.803 m)     Estimated body mass index is 32.36 kg/m as calculated from the following:   Height as of this encounter: 5\' 11"  (1.803 m).   Weight as of this encounter: 232 lb (105.2 kg).  @WEIGHTCHANGE @  Filed Weights   11/03/23 1557  Weight: 232 lb (105.2 kg)     Physical Exam   General: No distress. Looks sam O2 at rest: YES Cane present: no Sitting in wheel chair: no Frail: no Obese: no Neuro: Alert and Oriented x 3. GCS 15. Speech normal Psych: Pleasant Resp:  Barrel Chest - no.  Wheeze - no, Crackles - YES BASE, No overt respiratory distress CVS: Normal heart sounds. Murmurs - no Ext: Stigmata of Connective Tissue Disease - no HEENT: Normal upper airway. PEERL +. No post nasal drip        Assessment:       ICD-10-CM   1. Chronic respiratory failure with hypoxia (HCC)  J96.11 D-Dimer, Quantitative    CT Chest High Resolution    B Nat Peptide    D-Dimer, Quantitative    B Nat Peptide    2. ILD (interstitial lung disease) (HCC)  J84.9 D-Dimer, Quantitative    CT Chest High Resolution    B Nat Peptide    D-Dimer, Quantitative    B Nat Peptide    3. DOE (dyspnea on exertion)  R06.09 D-Dimer, Quantitative    CT Chest High Resolution    B Nat Peptide     D-Dimer, Quantitative    B Nat Peptide    4. Goals of care, counseling/discussion  Z71.89 D-Dimer, Quantitative    CT Chest High Resolution    B Nat Peptide    D-Dimer, Quantitative    B Nat Peptide    5. Chronic systolic congestive heart failure, NYHA class 3 (HCC)  I50.22 D-Dimer, Quantitative    CT Chest High Resolution    B Nat Peptide    D-Dimer, Quantitative    B Nat Peptide         Plan:     Patient Instructions  Chronic hypoxemic respiratory failure ILD (interstitial lung disease) (HCC) Goals of Care    Slowly and steadily getting worse Too bad you cannot tolerare oral prednisone or esbriet or ofev [place]-Medrol is not  working      [employer] - shared decision making -- blood work for d-dimer  - if abnormal, wil do CTA chest rule out PE - otherwise get HRCT to see if fibrosis getting worse -Continue oxygen therapy to keep your pulse ox goal greater than 88% - discussed goals of care  - respect not ready for home palliative care  - will do letter to denote that life expectancy can be limited to months (although this is variable)    Followup -video visit 2-3 weeks Dr Marchelle Gearing or APP to review results   FOLLOWUP Return in about 18 days (around 11/21/2023) for 15 min visit, VIDEO VISIT, with Dr Marchelle Gearing, with any of the APPS.    SIGNATURE    Dr. Kalman Shan, M.D., F.C.C.P,  Pulmonary and Critical Care Medicine Staff Physician, [facility] Center Director - Interstitial Lung Disease  Program  Pulmonary Fibrosis [facility] at [facility] [place], [place], [address]  Pager: [phone], If no answer or between  15:00h - 7:00h: call [phone] Telephone: [phone]  5:42 PM 11/03/2023

## 2023-11-03 NOTE — Progress Notes (Signed)
 Spirometry/diffusion capacity performed today.

## 2023-11-03 NOTE — Patient Instructions (Signed)
Spirometry/Diffusion capacity performed today. 

## 2023-11-04 LAB — BRAIN NATRIURETIC PEPTIDE: Pro B Natriuretic peptide (BNP): 47 pg/mL (ref 0.0–100.0)

## 2023-11-05 ENCOUNTER — Encounter: Payer: Self-pay | Admitting: Internal Medicine

## 2023-11-05 ENCOUNTER — Telehealth: Payer: Self-pay | Admitting: Internal Medicine

## 2023-11-05 ENCOUNTER — Ambulatory Visit (HOSPITAL_BASED_OUTPATIENT_CLINIC_OR_DEPARTMENT_OTHER)
Admission: RE | Admit: 2023-11-05 | Discharge: 2023-11-05 | Disposition: A | Discharge status: Home / Self Care | Payer: Medicare HMO | Source: Ambulatory Visit | Attending: Internal Medicine | Admitting: Internal Medicine

## 2023-11-05 DIAGNOSIS — R7989 Other specified abnormal findings of blood chemistry: Secondary | ICD-10-CM

## 2023-11-05 DIAGNOSIS — J849 Interstitial pulmonary disease, unspecified: Secondary | ICD-10-CM

## 2023-11-05 DIAGNOSIS — I700 Atherosclerosis of aorta: Secondary | ICD-10-CM | POA: Diagnosis not present

## 2023-11-05 DIAGNOSIS — R0602 Shortness of breath: Secondary | ICD-10-CM

## 2023-11-05 DIAGNOSIS — I517 Cardiomegaly: Secondary | ICD-10-CM | POA: Diagnosis not present

## 2023-11-05 DIAGNOSIS — I420 Dilated cardiomyopathy: Secondary | ICD-10-CM

## 2023-11-05 MED ORDER — IOHEXOL 350 MG/ML SOLN
75.0000 mL | Freq: Once | INTRAVENOUS | Status: AC | PRN
  Administered 2023-11-05: 75 mL via INTRAVENOUS

## 2023-11-05 NOTE — Progress Notes (Signed)
 No PE and results given

## 2023-11-05 NOTE — Telephone Encounter (Signed)
 I called and spoke with the pt and notified of results/recs  Pt states he does NOT have dye allergy and therefore wants to have CTA done  I spoke with MR regarding this via secure chat and he said ok to order CTA  I have cancelled the cxr and perf scan and ordered CTA, alerted PCC's of this  Nothing further needed

## 2023-11-05 NOTE — Telephone Encounter (Signed)
    D-dimer slightly high BNP normal  A Small chance he has PE If PE - small PE  Plan  - he has allergy to radio dye - so ordred Perf scan with cxr - to be done next few days (hopeuflly orders are correct) - if getting worse go to ER  SIGNATURE    Dr. Kalman Shan, M.D., F.C.C.P,  Pulmonary and Critical Care Medicine Staff Physician, [facility] Center Director - Interstitial Lung Disease  Program  Pulmonary Fibrosis [facility] at [facility] [place], [place], [address]   Pager: [phone], If no answer  -> Check AMION or Try [phone] Telephone (clinical office): [phone] Telephone (research): [phone]  9:48 AM 11/05/2023

## 2023-11-08 ENCOUNTER — Ambulatory Visit: Payer: Medicare HMO

## 2023-11-08 NOTE — Progress Notes (Unsigned)
 HPI: Follow-up CAD.  CPX March 2022 showed submaximal effort, moderate functional impairment due to pulmonary restriction.  High resolution chest CT March 2022 showed interstitial lung disease and aortic atherosclerosis; enlarged pulmonary trunk suggestive of pulmonary hypertension.  Cardiac catheterization March 2022 showed normal left main, minimal irregularities in the LAD, 2 small diagonals, circumflex, 2 obtuse marginals, posterior lateral, PDA and nondominant right coronary artery; ejection fraction 30 to 35% with mild left ventricular enlargement.  Echocardiogram June 2023 showed ejection fraction 40 to 45%, grade 1 diastolic dysfunction, moderate left atrial enlargement, mild mitral regurgitation.  Right heart catheterization June 2023 showed cardiac output 6.59 L/min, cardiac index 2.96 L/min, RA pressure 11, PA pressure 25/12 and pulmonary capillary wedge pressure 8. CTA January 2024 showed pneumonitis and atheromatous changes of the thoracic aorta but no aneurysm.     Patient admitted to [facility] March 2024 with STEMI.  Cardiac catheterization revealed 95 to 99% mid circumflex, 30% LAD, 50% PDA.  Patient had PCI of the left circumflex with drug-eluting stent.  Echocardiogram repeated February 23, 2023 and showed ejection fraction 25%, hypokinesis of the anterolateral and inferoseptal walls, severe left ventricular enlargement, mild left ventricular hypertrophy, grade 1 diastolic dysfunction, mild left atrial enlargement, mild mitral regurgitation and mildly dilated aortic root at 42 mm.  High-resolution chest CT June 2024 showed interstitial lung disease but no aneurysm.  Had biventricular ICD placed August 2024.  Carotid Dopplers February 2025 showed 40 to 59% right and 1 to 39% left stenosis.  CTA February 2025 showed no pulmonary embolus.  Since last seen  Current Outpatient Medications  Medication Sig Dispense Refill   albuterol (VENTOLIN HFA) 108 (90 Base) MCG/ACT inhaler INHALE  2 PUFFS BY MOUTH EVERY 6 HOURS AS NEEDED 18 each 3   AMBULATORY NON FORMULARY MEDICATION Continuous positive airway pressure (CPAP) machine set at autotitration of H2O pressure, with all supplemental supplies as needed. 1 each 0   aspirin EC 81 MG tablet Take 81 mg by mouth daily. Swallow whole.     busPIRone (BUSPAR) 15 MG tablet TAKE 1 TABLET BY MOUTH 2 TIMES DAILY. 60 tablet 1   chlorthalidone (HYGROTON) 25 MG tablet TAKE 1 TABLET BY MOUTH EVERY DAY 90 tablet 1   clopidogrel (PLAVIX) 75 MG tablet TAKE 1 TABLET BY MOUTH EVERY DAY 90 tablet 3   colestipol (COLESTID) 1 g tablet TAKE 1 TABLET BY MOUTH EVERY DAY 90 tablet 1   dicyclomine (BENTYL) 20 MG tablet TAKE 1 TABLET BY MOUTH THREE TIMES A DAY 270 tablet 1   ezetimibe (ZETIA) 10 MG tablet TAKE 1 TABLET BY MOUTH EVERYDAY AT BEDTIME 90 tablet 2   finasteride (PROSCAR) 5 MG tablet TAKE 1 TABLET (5 MG TOTAL) BY MOUTH DAILY. 90 tablet 2   ibuprofen (ADVIL) 800 MG tablet TAKE 1 TABLET BY MOUTH EVERY 8 HOURS AS NEEDED 90 tablet 5   ipratropium-albuterol (DUONEB) 0.5-2.5 (3) MG/3ML SOLN TAKE 3 MLS BY NEBULIZATION EVERY 2 (TWO) HOURS AS NEEDED (WHEEZE, SHORTNESS OF BREATH). 90 mL 3   KLOR-CON M10 10 MEQ tablet TAKE 1 TABLET BY MOUTH TWICE A DAY 180 tablet 1   loratadine (CLARITIN) 10 MG tablet Take 10 mg by mouth in the morning.     metoprolol succinate (TOPROL-XL) 25 MG 24 hr tablet TAKE 0.5 TABLETS BY MOUTH AT BEDTIME. 45 tablet 3   Misc. Devices (PULSE OXIMETER) MISC Monitor oxygen three times per day. 1 each 0   modafinil (PROVIGIL) 200 MG tablet  Take 1 tablet (200 mg total) by mouth daily. 30 tablet 0   ondansetron (ZOFRAN) 8 MG tablet TAKE 1 TABLET BY MOUTH EVERY 8 HOURS AS NEEDED FOR NAUSEA FOR VOMITING 270 tablet 1   oseltamivir (TAMIFLU) 75 MG capsule Take 1 capsule (75 mg total) by mouth 2 (two) times daily. 10 capsule 0   OXYGEN Inhale 3-4 L into the lungs continuous.     pantoprazole (PROTONIX) 40 MG tablet TAKE 1 TABLET BY MOUTH TWICE A  DAY 180 tablet 1   Phenylephrine-Acetaminophen (TYLENOL SINUS CONGESTION/PAIN PO) Take 1 tablet by mouth daily as needed (Congestion).     sacubitril-valsartan (ENTRESTO) 97-103 MG Take 1 tablet by mouth 2 (two) times daily. 60 tablet 2   sertraline (ZOLOFT) 100 MG tablet TAKE 2 TABLETS BY MOUTH EVERY DAY 60 tablet 1   sodium bicarbonate 650 MG tablet TAKE ONE TABLET BY MOUTH 4 TIMES DAILY 120 tablet 5   spironolactone (ALDACTONE) 25 MG tablet TAKE 1/2 TABLET BY MOUTH EVERY DAY 45 tablet 3   triamcinolone (NASACORT) 55 MCG/ACT AERO nasal inhaler Place 2-3 sprays into the nose daily as needed (allergies (when pollen is very high)).     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    Past Surgical History:  Procedure Laterality Date   ANKLE FRACTURE SURGERY Left    APPENDECTOMY     BIOPSY  11/18/2021   Procedure: BIOPSY;  Surgeon: Shellia Cleverly, DO;  Location: [facility];  Service: Gastroenterology;;  EGD and COLON   BIV ICD INSERTION CRT-D N/A 04/14/2023   Procedure: BIV ICD INSERTION CRT-D;  Surgeon: Regan Lemming, MD;  Location: [facility] INVASIVE CV LAB;  Service: Cardiovascular;  Laterality: N/A;   CERVICAL FUSION     CHOLECYSTECTOMY     COLON SURGERY     COLONOSCOPY WITH PROPOFOL N/A 11/18/2021   Procedure: COLONOSCOPY WITH PROPOFOL;  Surgeon: Shellia Cleverly, DO;  Location: [facility];  Service: Gastroenterology;  Laterality: N/A;   ESOPHAGOGASTRODUODENOSCOPY (EGD)  WITH PROPOFOL N/A 11/18/2021   Procedure: ESOPHAGOGASTRODUODENOSCOPY (EGD) WITH PROPOFOL;  Surgeon: Shellia Cleverly, DO;  Location: [facility];  Service: Gastroenterology;  Laterality: N/A;   HERNIA REPAIR     POLYPECTOMY  11/18/2021   Procedure: POLYPECTOMY;  Surgeon: Shellia Cleverly, DO;  Location: [facility];  Service: Gastroenterology;;   RIGHT HEART CATH N/A 02/13/2022   Procedure: RIGHT HEART CATH;  Surgeon: Kathleene Hazel, MD;  Location: [facility] INVASIVE CV LAB;  Service: Cardiovascular;  Laterality: N/A;    Social History   Socioeconomic History   Marital status: Married    Spouse name: Dawn   Number of children: 1   Years of education: 14   Highest education level: Some college, no degree  Occupational History   Occupation: Disabled  Tobacco Use   Smoking status: Former    Current packs/day: 0.00    Average packs/day: 1  pack/day for 28.0 years (28.0 ttl pk-yrs)    Types: Cigarettes    Start date: 53    Quit date: 2010    Years since quitting: 15.1   Smokeless tobacco: Never  Vaping Use   Vaping status: Never Used  Substance and Sexual Activity   Alcohol use: No   Drug use: No    Comment: 04/06/2018 Patient's wife wanted [place] to be very aware that her husband is a recovering narcotic abuser   Sexual activity: Yes  Other Topics Concern   Not on file  Social History Narrative   Lives with his wife, daughter, son-in-law and 2 two grandchildren. Likes to spend to time with grandchildren. Does not exercise due to back issues in the past and respiratory problems.   Social Drivers of Corporate investment banker Strain: Low Risk  (02/04/2022)   Overall Financial Resource Strain (CARDIA)    Difficulty of Paying Living Expenses: Not hard at all  Food Insecurity: Low Risk  (11/16/2022)   [facility]   Hunger Vital Sign    Worried About Running Out of Food in the Last Year: Never true    Ran Out of Food in the Last Year: Never true   Transportation Needs: Unmet Transportation Needs (11/16/2022)   [facility]   Transportation    In the past 12 months, has lack of reliable transportation kept you from medical appointments, meetings, work or from getting things needed for daily living? : Yes  Physical Activity: Inactive (02/04/2022)   Exercise Vital Sign    Days of Exercise per Week: 0 days    Minutes of Exercise per Session: 0 min  Stress: No Stress Concern Present (02/04/2022)   [employer] of Occupational Health - Occupational Stress Questionnaire    Feeling of Stress : Not at all  Social Connections: Moderately Integrated (02/04/2022)   Social Connection and Isolation Panel [NHANES]    Frequency of Communication with Friends and Family: Once a week    Frequency of Social Gatherings with Friends and Family: More than three times a week    Attends Religious Services: More than 4 times per year    Active Member of [employer] or Organizations: No    Attends Banker Meetings: Never    Marital Status: Married  Catering manager Violence: Not At Risk (02/04/2022)   Humiliation, Afraid, Rape, and Kick questionnaire    Fear of Current or Ex-Partner: No    Emotionally Abused: No    Physically Abused: No    Sexually Abused: No    Family History  Problem Relation Age of Onset   Cancer Mother        breast   Depression Mother    Hyperlipidemia Mother    Dementia Mother    Alcohol abuse Father    Cancer Father        skin   Hyperlipidemia Father    Alzheimer's disease Father    Cancer Maternal Grandfather    Cancer Paternal Grandmother    Pancreatic cancer Paternal Uncle    Colon cancer Neg Hx    Esophageal cancer Neg Hx    Stomach cancer Neg Hx     ROS: no fevers or chills, productive cough, hemoptysis, dysphasia, odynophagia, melena, hematochezia, dysuria, hematuria, rash, seizure activity, orthopnea, PND, pedal edema, claudication. Remaining systems are  negative.  Physical Exam: Well-developed well-nourished in no acute distress.  Skin is warm and dry.  HEENT is normal.  Neck is supple.  Chest is clear to auscultation with normal expansion.  Cardiovascular exam is regular rate and rhythm.  Abdominal exam nontender or distended. No masses palpated. Extremities show no edema. neuro grossly intact  ECG- personally reviewed  A/P  1 coronary artery disease-continue aspirin and Plavix.  Will discontinue Plavix April 1 which will be 1 year from his previous PCI.  He is intolerant to statins.  2 hyperlipidemia-continue Zetia.  He is intolerant to statins and also did not tolerate Repatha.  3 hypertension-blood pressure controlled.  Continue present medical regimen.  4 chronic combined systolic/diastolic congestive heart failure-he appears to be euvolemic.  Continue Entresto, Toprol and spironolactone.  He has declined SGLT2 inhibitor.  5 prior ICD-per Dr. Elberta Fortis.  6 obstructive sleep apnea-continue CPAP.  7 interstitial lung disease-monitored by pulmonology.  Olga Millers, MD

## 2023-11-09 ENCOUNTER — Ambulatory Visit (HOSPITAL_BASED_OUTPATIENT_CLINIC_OR_DEPARTMENT_OTHER)
Admission: RE | Admit: 2023-11-09 | Discharge: 2023-11-09 | Disposition: A | Discharge status: Home / Self Care | Payer: Medicare HMO | Source: Ambulatory Visit | Attending: Internal Medicine | Admitting: Internal Medicine

## 2023-11-09 DIAGNOSIS — Z7189 Other specified counseling: Secondary | ICD-10-CM | POA: Diagnosis not present

## 2023-11-09 DIAGNOSIS — J849 Interstitial pulmonary disease, unspecified: Secondary | ICD-10-CM | POA: Diagnosis not present

## 2023-11-09 DIAGNOSIS — I5022 Chronic systolic (congestive) heart failure: Secondary | ICD-10-CM | POA: Insufficient documentation

## 2023-11-09 DIAGNOSIS — J9611 Chronic respiratory failure with hypoxia: Secondary | ICD-10-CM | POA: Diagnosis not present

## 2023-11-09 DIAGNOSIS — R0609 Other forms of dyspnea: Secondary | ICD-10-CM | POA: Diagnosis not present

## 2023-11-09 DIAGNOSIS — I700 Atherosclerosis of aorta: Secondary | ICD-10-CM | POA: Diagnosis not present

## 2023-11-10 ENCOUNTER — Ambulatory Visit: Payer: Medicare HMO | Admitting: Cardiology

## 2023-11-10 ENCOUNTER — Encounter: Payer: Self-pay | Admitting: Cardiology

## 2023-11-10 ENCOUNTER — Other Ambulatory Visit: Payer: Self-pay | Admitting: Physician Assistant

## 2023-11-10 ENCOUNTER — Encounter: Payer: Self-pay | Admitting: Internal Medicine

## 2023-11-10 VITALS — BP 135/86 | HR 110 | Ht 71.0 in | Wt 236.0 lb

## 2023-11-10 DIAGNOSIS — I2510 Atherosclerotic heart disease of native coronary artery without angina pectoris: Secondary | ICD-10-CM | POA: Diagnosis not present

## 2023-11-10 DIAGNOSIS — J849 Interstitial pulmonary disease, unspecified: Secondary | ICD-10-CM

## 2023-11-10 DIAGNOSIS — I10 Essential (primary) hypertension: Secondary | ICD-10-CM

## 2023-11-10 DIAGNOSIS — M545 Low back pain, unspecified: Secondary | ICD-10-CM

## 2023-11-10 DIAGNOSIS — E782 Mixed hyperlipidemia: Secondary | ICD-10-CM

## 2023-11-10 DIAGNOSIS — M62830 Muscle spasm of back: Secondary | ICD-10-CM

## 2023-11-10 NOTE — Patient Instructions (Signed)
    Follow-Up: At [facility], you and your health needs are our priority.  As part of our continuing mission to provide you with exceptional heart care, we have created designated Provider Care Teams.  These Care Teams include your primary Cardiologist (physician) and Advanced Practice Providers (APPs -  Physician Assistants and Nurse Practitioners) who all work together to provide you with the care you need, when you need it.   Your next appointment:   6 week(s)  Provider:   Olga Millers, MD

## 2023-11-11 ENCOUNTER — Other Ambulatory Visit: Payer: Medicare HMO

## 2023-11-12 ENCOUNTER — Ambulatory Visit: Payer: Medicare HMO | Admitting: Emergency Medicine

## 2023-11-15 ENCOUNTER — Encounter: Payer: Self-pay | Admitting: Internal Medicine

## 2023-11-15 ENCOUNTER — Other Ambulatory Visit: Payer: Self-pay | Admitting: Adult Health

## 2023-11-15 ENCOUNTER — Other Ambulatory Visit: Payer: Self-pay | Admitting: Physician Assistant

## 2023-11-15 DIAGNOSIS — I10 Essential (primary) hypertension: Secondary | ICD-10-CM

## 2023-11-15 DIAGNOSIS — F411 Generalized anxiety disorder: Secondary | ICD-10-CM

## 2023-11-17 ENCOUNTER — Other Ambulatory Visit: Payer: Self-pay | Admitting: Physician Assistant

## 2023-11-17 DIAGNOSIS — R110 Nausea: Secondary | ICD-10-CM

## 2023-11-17 NOTE — Addendum Note (Signed)
 Addended by: Elease Etienne A on: 11/17/2023 08:23 AM   Modules accepted: Orders

## 2023-11-17 NOTE — Progress Notes (Signed)
 Remote ICD transmission.

## 2023-11-19 ENCOUNTER — Other Ambulatory Visit: Payer: Self-pay | Admitting: Physician Assistant

## 2023-11-19 MED ORDER — AMOXICILLIN-POT CLAVULANATE 875-125 MG PO TABS: 1.0000 | ORAL_TABLET | Freq: Two times a day (BID) | ORAL | 0 refills | Status: DC

## 2023-11-19 NOTE — Progress Notes (Signed)
 Sinusitis symptoms for the last week worsening. Sent augmentin for 10 days.

## 2023-11-22 NOTE — Telephone Encounter (Signed)
   Good news    IMPRESSION: 1. Pulmonary parenchymal pattern of interstitial lung disease, as described above, similar to 02/25/2023. 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 Findings are suggestive of an alternative diagnosis (not UIP) per consensus guidelines: Diagnosis of Idiopathic Pulmonary Fibrosis: An Official ATS/ERS/JRS/ALAT Clinical Practice Guideline. Am Rosezetta Schlatter Crit Care Med Vol 198, Iss 5, [phone], May 08 2017. 2. Age advanced coronary artery calcification. 3.  Aortic atherosclerosis (ICD10-I70.0).     Electronically Signed   By: Leanna Battles M.D.   On: 11/22/2023 11:13

## 2023-11-23 ENCOUNTER — Telehealth: Payer: Self-pay

## 2023-11-23 NOTE — Telephone Encounter (Signed)
 LM w/ Brad from [employer] to look into Cpap

## 2023-11-25 ENCOUNTER — Other Ambulatory Visit: Payer: Self-pay | Admitting: Physician Assistant

## 2023-11-25 DIAGNOSIS — I214 Non-ST elevation (NSTEMI) myocardial infarction: Secondary | ICD-10-CM

## 2023-11-25 DIAGNOSIS — I2510 Atherosclerotic heart disease of native coronary artery without angina pectoris: Secondary | ICD-10-CM

## 2023-11-25 DIAGNOSIS — Z955 Presence of coronary angioplasty implant and graft: Secondary | ICD-10-CM

## 2023-11-25 DIAGNOSIS — E782 Mixed hyperlipidemia: Secondary | ICD-10-CM

## 2023-11-26 ENCOUNTER — Encounter: Payer: Self-pay | Admitting: Internal Medicine

## 2023-11-26 ENCOUNTER — Telehealth: Payer: Medicare HMO | Admitting: Internal Medicine

## 2023-11-26 DIAGNOSIS — J849 Interstitial pulmonary disease, unspecified: Secondary | ICD-10-CM

## 2023-11-26 DIAGNOSIS — I5022 Chronic systolic (congestive) heart failure: Secondary | ICD-10-CM

## 2023-11-26 DIAGNOSIS — J9611 Chronic respiratory failure with hypoxia: Secondary | ICD-10-CM | POA: Diagnosis not present

## 2023-11-26 NOTE — Progress Notes (Signed)
 OV 02/03/2022 -transfer of care to Dr. Marchelle Gearing at the ILD clinic by Dr. Val Eagle  Subjective:  Patient ID: Jake [name], male , DOB: 1961-07-19 , age 63 y.o. , MRN: [id number] , ADDRESS: [address] [place] [address] PCP Jomarie Longs, PA-C Patient Care Team: Nolene Ebbs as PCP - General (Family Medicine) Thomasene Ripple, DO as PCP - Cardiology (Cardiology) Fran Lowes, PA-C as Referring Physician (Pulmonary Disease) Gabriel Carina, [facility] as Pharmacist (Pharmacist)  This Provider for this visit: Treatment Team:  Attending Provider: Kalman Shan, MD    02/03/2022 -   Chief Complaint  Patient presents with   Follow-up    Pt switching from Dr. Wynona Neat to MR for ILD eval. Pt states lately he has had problems of SOB that can happen at any time and also has a chronic cough.     HPI Jake [name] 63 y.o. -history is provided by the wife, patient and also reviewed the medical records and the ILD questionnaire.  He has had insidious onset of shortness of breath for the last few years.  Chart review it appears that in 2020 when he got his respiratory infection [in retrospect he thinks this was COVID before the formal declaration of the pandemic] and since then he has been having progressive shortness of breath.  Especially over the last year or so his progression is worse..  For the last 1 year has been on oxygen 2 L.  He feels he haseven progressed more in the last few months.  And is now needing oxygen 4 L at rest at all the time.  Sometimes it is 3 L.  Wife says he easily desaturates into the 33s and occasionally into the 20s when he exerts.  When he does the bed or showers or does chores increases to 4 L.  He feels a portable oxygen is not helping him anymore.  Particularly more progressive in the last few months.  Recently was tried on prednisone.  He is not sure if it helped but it made him very angry had irate emotional outburst and he could not take  prednisone anymore.  Through all this late last year in 2022 he was off his Entresto for heart failure.  This made the shortness of breath worse but then he went back on it in January and he got better but nevertheless overall progressive dyspnea.     [facility] Comprehensive ILD Questionnaire  Symptoms:     Past Medical History :  Status post CABG in 2004.  He also had cardiac stents approximately 6-12 months ago according to his history with 1 repair of the stent.  Still it did not help the shortness of breath.  He has had COVID at least 3 times.  The first 1 was in January 2020 right before the onset of the pandemic.  The second 1 was in January 2020 and the third was in December 2022 during small bowel obstruction admission.  This was an incidental finding according to the wife  He has chronic systolic heart failure  Other issues include nausea, irritable bowel syndrome, acid reflux back and neck pain  He has obstructive sleep apnea  He has a history of pneumonia several years ago  ROS:  -Positive for fatigue for the last several months, arthralgia for the last several years, dysphagia for the last several decades, dry eyes for the last several months, nausea for the last several years, acid reflux for  the last several years also snoring for the last several years  FAMILY HISTORY of LUNG DISEASE:  Negative for any lung disease  PERSONAL EXPOSURE HISTORY:  -Did smoke in the remote past and quit -Smoked between 1985 and 2008 1 and half packs per day. -Possible cocaine use between 1991 and marijuana use between 1978 and 2008 ": Very little to none   HOME  EXPOSURE and HOBBY DETAILS :  -20 years ago he lived in a house that had black mold he lived there for few years but has not been exposed to that for 20 years.  -Was using a Phillips responding CPAP machine for a few years.  It was subject to recall because of foam silicone  contamination issues that caused  respiratory complications.  He stopped using this in 2021 shortly after the recall was issued.  -He is now living in his daughter's house for the last 3 months.  -The old house did have mold.  It also had mildew in the shower curtain. 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  Restrictive pattern with mild desaturations.  -Echocardiogram March 2022 ` -Ejection fraction 30-35%, grade 1 diastolic dysfunction  - BNP May 2023  - 31  - RHC June 2023  -  Nelva Bush RHC   Echo 02/12/22: EF 40-45%, mild MR. LA moderately dilated. RV size and function normal. GRade 1 DD. RHC 02/12/22: RA- 5 mmHg; PA 25/12 mmHg, mean PAP 17 mmHg, Wedge 8 mmHg, CO (fick) 6.59 L/mim, CI-2.96 L/min meter2.   03/12/2022: Today - follow up Patient presents today with wife after being started on Ofev. He is having significant nausea with his first dose of Ofev. He wakes up in the morning, takes his zofran and eats a small snack. He then takes his Ofev at 8 am with food as well, and has tried multiple different food options. He then becomes extremely nauseated around 845 and has to go lay down until he is able to take his second dose of Zofran around 1/2 pm. He finally begins to feel better and is able to tolerate his second dose. He is understandably  frustrated by this and does not wish to have this poor quality of life from his medications. He denies any vomiting or diarrhea. He has had some weight gain as he has had to eat so often with the Ofev to try to curve his nausea.   From a respiratory standpoint, he continues to get winded with minimal exertion. He can be changing the bed at home or walk 50 ft to the mailbox and has to sit down afterwards to rest and catch his breath. He has also had drops in his oxygen into the 70's-80's despite his supplemental 4 lpm so he has been minimally active because of this. No significant cough or wheezing. He has not noticed any difference in  his activity tolerance or lung function since starting the Ofev, but has felt so terrible that he's not sure if he would notice much.           OV 05/05/2022  Subjective:  Patient ID: Jake [name], male , DOB: [birth year] , age 50 y.o. , MRN: [id number] , ADDRESS: [address] [place] [address] PCP Jomarie Longs, PA-C Patient Care Team: Nolene Ebbs as PCP - General (Family Medicine) Thomasene Ripple, DO as PCP - Cardiology (Cardiology) Fran Lowes, PA-C as Referring Physician (Pulmonary Disease) Gabriel Carina, [facility] as Pharmacist (Pharmacist)  This Provider for this visit: Treatment Team:  Attending Provider: Kalman Shan, MD    05/05/2022 -   Chief Complaint  Patient presents with   Follow-up    Pt states he feels like his breathing has become worse since last visit. Also states he has had a dry cough, chest discomfort, and has been having GI issues due to the Esbriet.     HPI Jake [name] 63 y.o. --presents for follow-up.  Presents with his wife.  He has non-- IPF progressive phenotype ILD.  Suspected etiology usage of CPAP machine [employer].  He tried nintedanib but he had significant intolerance.  He is currently on fourth week of pirfenidone at full dose for the last 1 week.  He is having significant  diarrhea nausea and fatigue because of this.  At 2 pills 3 times daily it was more tolerable.  He is willing to go back to 2 pills 3 times daily and try.  He also significant anxiety and depression.  He is on Zoloft.  In the past he has tried SNRI or some other agent according to the wife but he had side effects.  I have recommended they talk with the primary care physician about this.  He is also trying to lose weight.  He has been prescribed Wegovy but there is no stock available.  He has been looked at [facility] for lung transplant but given his low EF he has been disqualified and they do not want to see him.  He did have a right heart catheterization in June 2023 and this was normal.  According to him after some delay from our office he has been referred to Drinda Butts at [facility] for lung transplant evaluation.  But he has not heard back from them.  I personally emailed Dr. Lendon Collar office.  Also he tells me that with 4 L nasal cannula at rest he is fine but when he does any laundry he desaturates into the 80s.  He has tried increasing to 5 L nasal cannula.  I advised that he could increase it further and keep his pulse ox over 86% and monitor it.  He is willing to do that.  They are not interested in immunomodulators right now because of young kids in the house.    Overall he and his wife are worried that he is declining.  He did have pulmonary function test today and?  Slight decline compared to May 2023.[name] Kitchen  He had liver function test monitoring blood work yesterday but so far the results are not back yet.   SUMMARY VISIT Dr Harrold Donath 06/09/22 at [facility]   He also agrees that patient does not have IPF.  CT scan is not consistent with UIP.  Currently pirfenidone is causing too many side effects.  He wants to stop it.  Because there is more suggestion of valvulitis he wants  to start CellCept.  He is going to start the patient on 500 mg twice daily today.  Patient seeing me in approximately a  month from now.  At the time I will increase to a total of 1 g twice daily.   He also wants the patient referred to pulmonary rehabilitation.   In terms of lung transplantation a lot of relative contraindications including weight and mild cardiomyopathy and intolerance to steroids.  At this point in time he wants patient to be more active and get into rehab  Of note patient only desaturated to 88% on room air and that walk test today   Plan  - Get patient in for CBC chemistry and liver function test in 2-3 weeks from today  -Refer to pulmonary rehabilitation   OV 07/20/2022  Subjective:  Patient ID: Jake [name], male , DOB: 07-Dec-1960 , age 65 y.o. , MRN: [id number] , ADDRESS: [address] [place] [address] PCP Jomarie Longs, PA-C Patient Care Team: Nolene Ebbs as PCP - General (Family Medicine) Thomasene Ripple, DO as PCP - Cardiology (Cardiology) Fran Lowes, PA-C as Referring Physician (Pulmonary Disease) Gabriel Carina, [facility] as Pharmacist (Pharmacist)  This Provider for this visit: Treatment Team:  Attending Provider: Kalman Shan, MD    07/20/2022 -   Chief Complaint  Patient presents with   Follow-up    Pt states he has been doing okay since last visit. Has not worn his O2 in about a month and states that he has been walking about 0.50mile a day.     HPI Jake [name] 63 y.o. -returns for follow-up with his wife.  Since his last visit he has lost over 10 pounds because of Ozempic.  He started his Ozempic.  Then approximately a month ago he did see Dr. Marcie Bal at [facility].  At the time his exercise hypoxemia improved.  He started beginning to feel better as well.  According to the wife and him a day prior to that he was still feeling miserable but on the day of the visit he started noticing a turnaround and since then he has had sustained turnaround.  He was having side effects from pirfenidone and this was stopped  early October 2023.  Dr. Harrold Donath recommended mycophenolate mofetil [patient mistakenly thought this was metoprolol at this visit but later confirmed that he has been on metoprolol for a long time to Dr. Jens Som for his heart failure].  Since then he is having sustained improvement in dyspnea.  Also he was able to go to [employer] and walk on flat ground for a long time without desaturating.  He still desaturates but it is only for heavy exertion such as vacuuming or lifting heavy load.  He will go into the low 80s and then rest to recover.  He does not use his oxygen for his hypoxemia.  Instead he opted to rest.  At this point in time he is telling me because of his improvement he does not want to do his mycophenolate Reather Littler is going to confirm it was indeed mycophenolate and not metoprolol].  I referred him to pulm rehabilitation but this was expensive.  He is opting to exercise by himself and continue weight loss with Ozempic.  Also because of his improvement he is asking if he can do a pulmonary function test.  His walking desaturation test is improved.  He also his pulmonary function test shows improvement although he still worse compared to June  of last year.  His last echocardiogram was in the summer 2023 and he still had low ejection fraction.  His BNP at that time was normal.  In terms of lung transplant Dr. Harrold Donath is told him it is too high risk given his medication side effects and heart failure and obesity.  At this point in time he is reconciled to the fact that he will not be a transplant candidate.  However he is overall pleased that he is improved.         OV 10/29/2022  Subjective:  Patient ID: Jake [name], male , DOB: [birth year] , age 54 y.o. , MRN: [id number] , ADDRESS: [address] [place] [address] PCP Jomarie Longs, PA-C Patient Care Team: Nolene Ebbs as PCP - General (Family Medicine) Thomasene Ripple, DO as PCP - Cardiology  (Cardiology) Fran Lowes, PA-C as Referring Physician (Pulmonary Disease) Gabriel Carina, [facility] as Pharmacist (Pharmacist)  This Provider for this visit: Treatment Team:  Attending Provider: Kalman Shan, MD    10/29/2022 -   Chief Complaint  Patient presents with   Follow-up    Cleda Daub review,     HPI Jake [name] 63 y.o. - Returns for follow-up with his wife.  Overall he reports being stable.  Weight is stable.  He is no longer on antifibrotic's because of intolerance.  His ejection fraction has dropped to 30% and he is frustrated by that.  Wife states that he does desaturate when he exerts and then he rests and the oxygen goes up.  She would prefer for him to wear the oxygen preemptively.  I encouraged him to do so because that is a better strategy.  At this point in time he states that he had a rough flu season he had stomach flu twice.  He believes he might have "once in 2 or 3 other respiratory infections again denies any mold or mildew or bird feathers in the house he does do housework but has to wear oxygen for that he can go shopping without oxygen but wife suspects he might be desaturating.  We did discuss clinical trials as a care option.  Presented to have inhaled pirfenidone trial that is yet to start and also inhaled treprostinil trial that is ongoing.  They are interested in this.  Discussed the following care concept about clinical trials.  Will reassess for this and second quarter or third quarter 2024.  He did have a CT scan of the chest Jan 2024 I reviewed the result.  ILD is described but there is no other abnormality described.  Since CT scan with contrast so we cannot compare the CT scans.     OV 01/05/2023  Subjective:  Patient ID: Jake [name], male , DOB: 01/24/1961 , age 64 y.o. , MRN: [id number] , ADDRESS: [address] [place] [address] PCP Jomarie Longs, PA-C Patient Care Team: Nolene Ebbs as PCP - General (Family  Medicine) Jens Som Madolyn Frieze, MD as PCP - Cardiology (Cardiology) Fran Lowes, PA-C as Referring Physician (Pulmonary Disease) Gabriel Carina, [facility] as Pharmacist (Pharmacist)  This Provider for this visit: Treatment Team:  Attending Provider: Kalman Shan, MD    01/05/2023 -   Chief Complaint  Patient presents with   Follow-up    F/up after heart attack    HPI Jake [name] 63 y.o. - presents with wife. On approx 11/16/22 felt unweasy with chest burn. Admitted at [facility] and dx with MI with trop  leak. Had DES. Dsichargd on Brilintia and metoprolol but both gav him side efffects of dyspnea, anxiety, arthralgia. So both stopped and now on plavix and repatha. Toleraitng it ok but for mild fatigues. He came in today to updated me. Resp wise he feels stable. Can hold pulse ox > 88% at rest but with exertun desaturates. Wife is heer with him and is indeoendenbt historian - says he is not promot about wearing his o2. Currenty dealing with allergies. Used wifes; Azihro Wondered if I could fill his azithro but his baseline 2024 and 2023 EKG bith show QTc > 500 msec       OV 02/16/2023  Subjective:  Patient ID: Jake [name], male , DOB: 03/28/1961 , age 27 y.o. , MRN: [id number] , ADDRESS: [address] [place] [address] PCP Jomarie Longs, PA-C Patient Care Team: Nolene Ebbs as PCP - General (Family Medicine) Jens Som Madolyn Frieze, MD as PCP - Cardiology (Cardiology) Fran Lowes, PA-C as Referring Physician (Pulmonary Disease) Gabriel Carina, [facility] as Pharmacist (Pharmacist)  This Provider for this visit: Treatment Team:  Attending Provider: Kalman Shan, MD  02/16/2023 -   Chief Complaint  Patient presents with   Acute Visit    Cough, chest tightness, increasing sob when not on oxygen.     HPI Jake [name] 63 y.o. - ACUTE VISIT3L.  Presents with his wife Daemien Fronczak.  She is an independent historian.  She corroborates everything that he is saying.   Around February 10, 2023 Wednesday started having slightly more cough.  Then on February 12, 2023 Friday he went to an outdoor concert in [place].  They started coughing more.  Then after he came back he started feeling and this is since February 13, 2023 tired with chest tightness coughing.  Also for the last 1 week his oxygen is dropping.  Previously could take a shower without desaturating now he drops into the 70s.  He says his pulse ox at rest is in the 80s.  Currently pulse ox is 98% on 3 L at rest.  There is no edema but there is orthopnea.  There is no hemoptysis.  There is no wheezing no fever no diarrhea.  Has baseline nausea and it has not changed.  He feels his chest is extremely tight.  There might be some wheezing 2.  The cough is dry but 1 time he did have 1 nickel sized mucus but that is just the 1 time in many days.  He is really worried about his condition.  He does not feel it similar to his previous heart attack.  His wife is wondering about getting a CT chest last CT chest was a year ago.   He does not want steroids.  Especially oral.  He says he is fine with IM steroids.      OV 05/13/2023  Subjective:  Patient ID: Jake [name], male , DOB: 1961/07/09 , age 56 y.o. , MRN: [id number] , ADDRESS: [address] [place] [address] PCP Jomarie Longs, PA-C Patient Care Team: Nolene Ebbs as PCP - General (Family Medicine) Jens Som Madolyn Frieze, MD as PCP - Cardiology (Cardiology) Regan Lemming, MD as PCP - Electrophysiology (Cardiology) Fran Lowes, PA-C as Referring Physician (Pulmonary Disease) Gabriel Carina, [facility] as Pharmacist (Pharmacist)  This Provider for this visit: Treatment Team:  Attending Provider: Kalman Shan, MD   05/13/2023 -   Chief Complaint  Patient presents with   Follow-up  O2 2-3 l       HPI Jake [name] 63 y.o. -presents for follow-up.  Presents with his wife who is an independent historian.  He tells me that the IM  Depo-Medrol I gave him helped him.  He cannot do oral prednisone because it changes his mood.  He states his mood gets so bad that even I would not like to know him.  He feels that after initial improvement from the acute flareup he started feeling worse again.  He feels he is chronically getting worse on 04/14/2023 he had ICD and pacemaker implantation but despite that he is getting worse.  Symptom score shows significant decline.  Concomitant with that high-resolution CT chest that I reviewed visualized and showed it to him and his wife shows worsening ILD.  Pulmonary function test also shows a decline.  Although the sit/stand exercise hypoxemia test appears to be the same.  He reports it is getting very difficult for him to go to the bathroom.  We had conversations about his life expectancy which could still be few to several years with a lot of variability.  We discussed treatment options.  He cannot tolerate the antifibrotic's pirfenidone or nintedanib.  He does not want to do oral prednisone.  We discussed CellCept but he is worried about immunosuppression.  He lives with his grandkids and he adores them.  He says they come with respiratory infections therefore he does not want to do this at this point he wants to do IM Depo-Medrol on a scheduled basis.  I have reached out to our pharmacy team he is also willing to try IV Solu-Medrol.  We discussed clinical trials as a care option.  He is already in the ILD-Pro registry.  He will have a research visit today.  There is inhaled pirfenidone and treprostinil studies.  We will prescreen this    CT Chest data from date: HRCT 02/25/23  - personally visualized and independently interpreted : yes - my findings are: as below Narrative & Impression  CLINICAL DATA:  63 year old male with history of prior myocardial infarction. Evaluate for interstitial lung disease.   EXAM: CT CHEST WITHOUT CONTRAST   TECHNIQUE: Multidetector CT imaging of the chest was  performed following the standard protocol without intravenous contrast. High resolution imaging of the lungs, as well as inspiratory and expiratory imaging, was performed.   RADIATION DOSE REDUCTION: This exam was performed according to the departmental dose-optimization program which includes automated exposure control, adjustment of the mA and/or kV according to patient size and/or use of iterative reconstruction technique.   COMPARISON:  Multiple priors, including prior high-resolution chest CT 01/16/2022.   FINDINGS: Cardiovascular: Heart size is normal. There is no significant pericardial fluid, thickening or pericardial calcification. There is aortic atherosclerosis, as well as atherosclerosis of the great vessels of the mediastinum and the coronary arteries, including calcified atherosclerotic plaque in the left anterior descending and right coronary arteries. Calcifications of the aortic valve.   Mediastinum/Nodes: No pathologically enlarged mediastinal or hilar lymph nodes. Please note that accurate exclusion of hilar adenopathy is limited on noncontrast CT scans. Esophagus is unremarkable in appearance. No axillary lymphadenopathy.   Lungs/Pleura: High-resolution images again demonstrate patchy areas of ground-glass attenuation, septal thickening, subpleural reticulation, mild cylindrical bronchiectasis and peripheral bronchiolectasis scattered throughout the lungs bilaterally. There is no discernible craniocaudal gradient to these findings. No frank honeycombing. Findings appear minimally progressive compared to the prior study from 01/16/2022. Inspiratory and expiratory imaging demonstrates mild air trapping  indicative of small airways disease. No acute consolidative airspace disease. No pleural effusions. No definite suspicious appearing pulmonary nodules or masses are noted.   Upper Abdomen: Atherosclerotic calcifications in the abdominal aorta. Status post  cholecystectomy.   Musculoskeletal: Orthopedic fixation hardware in the lower cervical spine incidentally noted. There are no aggressive appearing lytic or blastic lesions noted in the visualized portions of the skeleton.   IMPRESSION: 1. The appearance of the lungs remains compatible with interstitial lung disease, with a spectrum of findings considered most compatible with an alternative diagnosis (not usual interstitial pneumonia) per current ATS guidelines. 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 2. Aortic atherosclerosis, in addition to two-vessel coronary artery disease. Please note that although the presence of coronary artery calcium documents the presence of coronary artery disease, the severity of this disease and any potential stenosis cannot be assessed on this non-gated CT examination. Assessment for potential risk factor modification, dietary therapy or pharmacologic therapy may be warranted, if clinically indicated. 3. There are calcifications of the aortic valve. Echocardiographic correlation for evaluation of potential valvular dysfunction may be warranted if clinically indicated.   Aortic Atherosclerosis (ICD10-I70.0).     Electronically Signed   By: Trudie Reed M.D.   On: 03/01/2023 12:31     OV 07/15/2023  Subjective:  Patient ID: Jake [name], male , DOB: 1960/10/16 , age 78 y.o. , MRN: [id number] , ADDRESS: [address] [place] [address] PCP Jomarie Longs, PA-C Patient Care Team: Nolene Ebbs as PCP - General (Family Medicine) Jens Som Madolyn Frieze, MD as PCP - Cardiology (Cardiology) Regan Lemming, MD as PCP - Electrophysiology (Cardiology) Fran Lowes, PA-C as Referring Physician (Pulmonary Disease) Gabriel Carina, [facility] as Pharmacist (Pharmacist)  This Provider for this visit: Treatment Team:  Attending Provider:  Kalman Shan, MD      07/15/2023 -   Chief Complaint  Patient presents with   Follow-up    Pt states steroid shot has not been scheduled. Pt states breathing still the same      HPI Jake [name] 63 y.o. -presents for follow-up.  Presents with his wife.  She is an independent historian.  Last visit we decided to try IM Depo-Medrol on a weekly basis to try to control inflammation as long because his progressive phenotype and his ILD.  He cannot tolerate antifibrotic's.  However we run into logistical issues.  It took a while to figure out the right dosing and the schedule with the pharmacy team.  But right now we have shortage of support staff.  Therefore we resolved today that I would reach out to his primary care physician assistant to see if it can be done there.  That office is close to his home anyways.  I did advise him that it may not be exactly on the same day every week it can be plus a -2 days.  Nonetheless from a symptom standpoint he is doing stable although early in the morning he does feel more short of breath when he exerts he is more short of breath he is using his oxygen day and [name].  Right now at rest it is 88% on room air.  He did get IM Depo-Medrol for a stomach virus a few weeks ago and it did help him according to his history.  Of note I did notice that he was tachycardic today at rest and review of the  records indicate that he said some send tachycardia event at the last visit.  I did mention to him the role of ivabradine and inappropriate sinus tachycardia and heart failure patients.  Did say that 1 patient who got ivabradine in the setting of normal ejection fraction and inappropriate sinus tachycardia did feel better.  This patient had ILD.  I have reached out to Dr. Elberta Fortis to see if this would be an appropriate therapy for the patient.        OV 11/03/2023  Subjective:  Patient ID: Jake [name], male , DOB: [birth year] , age 56 y.o. , MRN: [id number]  , ADDRESS: [address] [place] [address] PCP Jomarie Longs, PA-C Patient Care Team: Nolene Ebbs as PCP - General (Family Medicine) Jens Som Madolyn Frieze, MD as PCP - Cardiology (Cardiology) Regan Lemming, MD as PCP - Electrophysiology (Cardiology) Fran Lowes, PA-C as Referring Physician (Pulmonary Disease) Gabriel Carina, [facility] (Inactive) as Pharmacist (Pharmacist)  This Provider for this visit: Treatment Team:  Attending Provider: Kalman Shan, MD     11/03/2023 -   Chief Complaint  Patient presents with   Follow-up     HPI Jake [name] 63 y.o. -presents with his wife.  Overall they feel he is getting worse.  Shortness of breath is getting worse.  He is now needing 4 L at rest and 6 L with exertion such as making a bed.  The weekly steroids which she has been taking for the last 2 months [on pause for the last 3 weeks ) is not helping.  Wife feels it is making worse.  He is more irritable.  They did have a cardiology visit recently.  He did have pulmonary function test his FVC shows decline his DLCO shows stability compared to the fall 2024 but compared to a year ago both are declined.  Most recent echo was in the summer 2024 with low ejection fraction.  They have talked about life expectancy.  I did express to them it could be few years.  Could also be a few months and is variable.  They wanted a letter for financial reasons so I asked the CMA to print 1 out.  They not ready for home palliative care he has good support at home from his wife.  They are interested in knowing the status of the fibrosis.  We aligned to check a D-dimer first and if its normal go to a high-res CT chest.     OV 11/26/2023  Subjective:  Patient ID: Jake [name], male , DOB: 1961-07-04 , age 20 y.o. , MRN: [id number] , ADDRESS: [address] [place] [address] PCP Jomarie Longs, PA-C Patient Care Team: Nolene Ebbs as PCP - General  (Family Medicine) Jens Som Madolyn Frieze, MD as PCP - Cardiology (Cardiology) Regan Lemming, MD as PCP - Electrophysiology (Cardiology) Fran Lowes, PA-C as Referring Physician (Pulmonary Disease) Gabriel Carina, [facility] (Inactive) as Pharmacist (Pharmacist)     Type of visit: Video Virtual Visit Identification of patient Jake [name] with 11-25-1960 and MRN [id number] - 2 person identifier Risks: Risks, benefits, limitations of telephone visit explained. Patient understood and verbalized agreement to proceed Anyone else on call: hiw wife Patient location: his car This provider location: [address]; [place]; [place] [address]. [facility]. 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  CT scans  - 2021 -> perhaps some early groundglass opacities -> 2022: Increased groundglass opacities with some early reticulation -> 2023 scattered groundglass opacities of different pattern > 2024: More fibrotic appearance in the bilateral upper lobe anterior segment and bilateral lower lobe> stable appearance as of March 2025   Associated chronic systolic heart failrue  - ef 35% currently - declined by [facility] lung transplant team - summer 2023 - not a candidate for lung transplatn per [facility] - oct 2023 - s/p MI needing stent march 2024 at [facility]  -Status post ICD and pacemaker implantation 04/14/2023.  Echo June 2024 with EF 25%  Prolonged QTc  11/26/2023  -presents with his wife for this video visit.  He is sitting in his car.  He tells me that with 4 L of oxygen he was getting cramps after increasing to 5 L at rest the cramps are reduced.  He feels maybe he was hypoxemic at [name].  He still has significant class III dyspnea on exertion.  I rule out pulmonary embolism late February 2025 and then we went to an got high-resolution CT chest.  The fibrotic densities are unchanged compared to summer 2024 but is clear progression between now and 2021, between now and 2022 and between now and 2023.  He is off his steroids.  I did indicate to him the steroids probably did not help him.  At this point even though his progressive phenotype he is stable in the last 9 months.  In terms of his class III dyspnea I did raise the possibility that his systolic heart failure EF 25% is playing a role.  I have asked him to visit with Dr. Jens Som.  I believe he is on maximal medical therapy.  I did indicate to him to discuss about heart failure services and if that would be beneficial for him.   HPI Jake [name] 63 y.o. -  SYMPTOM SCALE - ILD 02/03/2022 07/20/2022 Off esbreit and ofev 10/29/22 Ef now 30-35% 05/13/2023 Ef 25$ 07/15/2023 Ef 25$ 11/03/2023 Taking Depo-Medrol every week1  Current weight 231# 224# - ozempic 229#     O2 use 4L [place] rest  Ra rest     Shortness of Breath 0 -> 5 scale with 5 being worst (score 6 If unable to do)       At rest 2 0 2 2 3 1   Simple tasks - showers, clothes change, eating, shaving 5 2 3 4 4 4   Household (dishes, doing bed, laundry) 5 4 5 5 5 4   Shopping 5 2 1 4 4 4   Walking level at own pace 4 3 2 4 4 2   Walking up Stairs 5 4 4 5 5 4   Total (30-36) Dyspnea Score 26 15 17 24 25 17       Non-dyspnea symptoms (0-> 5 scale) 02/03/2022 07/20/2022  10/29/2022  05/13/2023  07/15/2023  11/03/2023   How bad is your cough? 4 3 1 4 4 3   How bad is your fatigue 5 2 3 5 5 4   How bad is nausea 4 5 4 4 4 3   How bad is vomiting?  0 0 0  0 0 0  How bad is diarrhea? 0 1 0 0 2 0  How bad is anxiety? 5 1 2 5 4 4   How bad is depression 5 1 2 2 4 4   Any chronic pain - if so where and how bad 3 x   x 0     Simple office walk 185 feet x  3 laps goal with forehead probe 07/20/2022  01/05/2023  02/16/2023  05/13/2023  07/15/2023   O2 used ra ra 3L ra ra  Number laps completed 3 5 sit stand  5 sit stand rest  Comments about pace avg      Resting Pulse Ox/HR 99% and 85/min 94% 98% at rest 97% and HR 95 88% Room air res -, HR 108  Final Pulse Ox/HR 93% and 100/min 89%  88% befiefly a minute later   Desaturated </= 88% no      Desaturated <= 3% points Yes 6       Got Tachycardic >/= 90/min yes      Symptoms at end of test Mild dyspea      Miscellaneous comments improved       CT Chest data from date: 11/09/23  - personally visualized and independently interpreted : yes - my findings are: Mentioned above in discussion. arrative & Impression  CLINICAL DATA:  Diffuse/interstitial lung disease. Insidious onset of shortness of breath since COVID.   EXAM: CT CHEST WITHOUT CONTRAST   TECHNIQUE: Multidetector CT imaging of the chest was performed following the standard protocol without intravenous contrast. High resolution imaging of the lungs, as well as inspiratory and expiratory imaging, was performed.   RADIATION DOSE REDUCTION: This exam was performed according to the departmental dose-optimization program which includes automated exposure control, adjustment of the mA and/or kV according to patient size and/or use of iterative reconstruction technique.   COMPARISON:  11/05/2023, 02/25/2023, 01/16/2022 and 11/07/2020.   FINDINGS: Cardiovascular: Atherosclerotic calcification of the aorta and aortic valve with age advanced involvement of the coronary arteries. Ascending aorta measures up to approximately 3.9 cm (coronal image 102). Enlarged pulmonic trunk and heart. No pericardial effusion.   Mediastinum/Nodes: No  pathologically enlarged mediastinal or axillary lymph nodes. Hilar regions are difficult to definitively evaluate without IV contrast. Esophagus is grossly unremarkable.   Lungs/Pleura: Coarsened interstitial and subpleural ground-glass with traction bronchiectasis/bronchiolectasis and subpleural reticular densities. Findings appear similar to 02/25/2023. Calcified granulomas. No pleural fluid. Airway is unremarkable. There is air trapping.   Upper Abdomen: Visualized portions of the liver, right adrenal gland, spleen, pancreas, stomach and bowel are grossly unremarkable. Left hemidiaphragm is mildly elevated.   Musculoskeletal: Degenerative changes in the spine.   IMPRESSION: 1. Pulmonary parenchymal pattern of interstitial lung disease, as described above, similar to 02/25/2023. 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 Findings are suggestive of an alternative diagnosis (not UIP) per consensus guidelines: Diagnosis of Idiopathic Pulmonary Fibrosis: An Official ATS/ERS/JRS/ALAT Clinical Practice Guideline. Am Rosezetta Schlatter Crit Care Med Vol 198, Iss 5, [phone], May 08 2017. 2. Age advanced coronary artery calcification. 3.  Aortic atherosclerosis (ICD10-I70.0).     Electronically Signed   By: Leanna Battles M.D.   On: 11/22/2023 11:13    PFT     Latest Ref Rng & Units 05/11/2023    2:56 PM 10/29/2022   12:49 PM 07/02/2022   11:35 AM 05/04/2022    3:32 PM 01/15/2022    3:58 PM 02/26/2021  12:00 PM  PFT Results  FVC-Pre L 1.93   2.20  1.87  1.91  2.44   FVC-Predicted Pre % 39  41  44  38  38  49   FVC-Post L     1.97  2.35   FVC-Predicted Post %     40  47   Pre FEV1/FVC % % 91  91  91  94  96  93   Post FEV1/FCV % %     90  91   FEV1-Pre L 1.75  1.87  2.01  1.77  1.83  2.27   FEV1-Predicted Pre % 47  50  54  47  49  60   FEV1-Post L     1.78  2.15   DLCO uncorrected ml/min/mmHg 8.40  12.55  12.27  11.35  14.05   16.66   DLCO UNC% % 29  44  43  40  49  58   DLCO corrected ml/min/mmHg   12.27  11.93  14.05  16.66   DLCO COR %Predicted %   43  42  49  58   DLVA Predicted % 67  97  86  92  105  101   TLC L 8.29     4.36  4.52   TLC % Predicted % 115     60  62   RV % Predicted % 272     60  66        LAB RESULTS last 96 hours No results found.       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   reports that he quit smoking about 15 years ago. His smoking use included cigarettes. He started smoking about 43 years ago. He has a 28 pack-year smoking history. He has never used smokeless tobacco.  Past Surgical History:  Procedure Laterality Date   ANKLE FRACTURE SURGERY Left    APPENDECTOMY     BIOPSY  11/18/2021   Procedure: BIOPSY;  Surgeon: Shellia Cleverly, DO;  Location: [facility];  Service: Gastroenterology;;  EGD and COLON   BIV ICD INSERTION CRT-D N/A 04/14/2023   Procedure: BIV ICD INSERTION CRT-D;  Surgeon: Regan Lemming, MD;  Location: [facility] INVASIVE CV LAB;  Service: Cardiovascular;  Laterality: N/A;   CERVICAL FUSION     CHOLECYSTECTOMY     COLON SURGERY     COLONOSCOPY WITH PROPOFOL N/A 11/18/2021   Procedure: COLONOSCOPY WITH PROPOFOL;  Surgeon: Shellia Cleverly, DO;  Location: [facility];  Service: Gastroenterology;  Laterality: N/A;   ESOPHAGOGASTRODUODENOSCOPY (EGD) WITH PROPOFOL N/A 11/18/2021   Procedure: ESOPHAGOGASTRODUODENOSCOPY (EGD) WITH PROPOFOL;  Surgeon: Shellia Cleverly, DO;  Location: [facility];  Service: Gastroenterology;  Laterality: N/A;   HERNIA REPAIR     POLYPECTOMY  11/18/2021   Procedure: POLYPECTOMY;  Surgeon: Shellia Cleverly, DO;  Location: [facility];  Service: Gastroenterology;;   RIGHT HEART CATH N/A 02/13/2022   Procedure: RIGHT HEART CATH;  Surgeon: Kathleene Hazel, MD;  Location:  [facility] INVASIVE CV LAB;  Service: Cardiovascular;  Laterality: N/A;    Allergies  Allergen Reactions   Fentanyl Other (See Comments)    Avoid opiates Other reaction(s): Other Avoid opiates   Other     04/06/18 Patient is a recovering narcotic abuser and, per wife, is NEVER to receive controlled substances (e.g. Narcotics, sedatives, etc.) Other reaction(s): Unknown 04/06/18 Patient is a  recovering narcotic abuser and, per wife, is NEVER to receive controlled substances (e.g. Narcotics, sedatives, etc.)   Aspirin Nausea And Vomiting   Lipitor [Atorvastatin] Nausea And Vomiting    GI side effects.     Prednisone Other (See Comments)     Agitation Severe anger outbursts   Gabapentin     Excessive somnolence, unsteadiness, falls   Ofev [Nintedanib]     GI side effects   Pirfenidone    Nitroglycerin Nausea And Vomiting    Can take with Nausea Meditcation    Immunization History  Administered Date(s) Administered   Influenza Inj Mdck Quad Pf 06/18/2022   Influenza Split 07/16/2014   Influenza, Seasonal, Injecte, Preservative Fre 08/30/2023   Influenza,inj,Quad PF,6+ Mos 07/16/2014, 07/21/2017, 08/22/2018, 06/13/2019, 06/10/2021   Influenza-Unspecified 07/16/2014, 06/14/2016   PFIZER Comirnaty(Gray Top)Covid-19 Tri-Sucrose Vaccine 06/18/2022   PFIZER(Purple Top)SARS-COV-2 Vaccination 11/21/2019, 12/24/2019, 06/23/2020   PNEUMOCOCCAL CONJUGATE-20 09/13/2023   Pfizer(Comirnaty)Fall Seasonal Vaccine 12 years and older 08/20/2023   Respiratory Syncytial Virus Vaccine,Recomb Aduvanted(Arexvy) 07/23/2022, 09/27/2023   Td 04/15/2015   Td (Adult), 2 Lf Tetanus Toxid, Preservative Free 04/15/2015   Td (Adult),5 Lf Tetanus Toxid, Preservative Free 04/15/2015   Tdap 10/10/2009   Zoster Recombinant(Shingrix) 06/10/2021    Family History  Problem Relation Age of Onset   Cancer Mother        breast   Depression Mother    Hyperlipidemia Mother    Dementia Mother    Alcohol abuse Father     Cancer Father        skin   Hyperlipidemia Father    Alzheimer's disease Father    Cancer Maternal Grandfather    Cancer Paternal Grandmother    Pancreatic cancer Paternal Uncle    Colon cancer Neg Hx    Esophageal cancer Neg Hx    Stomach cancer Neg Hx      Current Outpatient Medications:    albuterol (VENTOLIN HFA) 108 (90 Base) MCG/ACT inhaler, INHALE 2 PUFFS BY MOUTH EVERY 6 HOURS AS NEEDED, Disp: 18 each, Rfl: 3   AMBULATORY NON FORMULARY MEDICATION, Continuous positive airway pressure (CPAP) machine set at autotitration of H2O pressure, with all supplemental supplies as needed., Disp: 1 each, Rfl: 0   amoxicillin-clavulanate (AUGMENTIN) 875-125 MG tablet, Take 1 tablet by mouth 2 (two) times daily., Disp: 20 tablet, Rfl: 0   aspirin EC 81 MG tablet, Take 81 mg by mouth daily. Swallow whole., Disp: , Rfl:    busPIRone (BUSPAR) 15 MG tablet, TAKE 1 TABLET BY MOUTH TWICE A DAY, Disp: 60 tablet, Rfl: 1   chlorthalidone (HYGROTON) 25 MG tablet, TAKE 1 TABLET BY MOUTH EVERY DAY, Disp: 90 tablet, Rfl: 1   clopidogrel (PLAVIX) 75 MG tablet, TAKE 1 TABLET BY MOUTH EVERY DAY, Disp: 90 tablet, Rfl: 3   colestipol (COLESTID) 1 g tablet, TAKE 1 TABLET BY MOUTH EVERY DAY, Disp: 90 tablet, Rfl: 1   dicyclomine (BENTYL) 20 MG tablet, TAKE 1 TABLET BY MOUTH THREE TIMES A DAY, Disp: 270 tablet, Rfl: 1   ezetimibe (ZETIA) 10 MG tablet, TAKE 1 TABLET BY MOUTH EVERYDAY AT BEDTIME, Disp: 90 tablet, Rfl: 2   finasteride (PROSCAR) 5 MG tablet, TAKE 1 TABLET (5 MG TOTAL) BY MOUTH DAILY., Disp: 90 tablet, Rfl: 2   ibuprofen (ADVIL) 800 MG tablet, TAKE 1 TABLET BY MOUTH EVERY 8 HOURS AS NEEDED, Disp: 90 tablet, Rfl: 5   ipratropium-albuterol (DUONEB) 0.5-2.5 (3) MG/3ML SOLN, TAKE 3 MLS BY NEBULIZATION EVERY 2 (TWO) HOURS AS NEEDED (  WHEEZE, SHORTNESS OF BREATH)., Disp: 90 mL, Rfl: 3   KLOR-CON M10 10 MEQ tablet, TAKE 1 TABLET BY MOUTH TWICE A DAY, Disp: 180 tablet, Rfl: 1   loratadine (CLARITIN) 10 MG tablet,  Take 10 mg by mouth in the morning., Disp: , Rfl:    metoprolol succinate (TOPROL-XL) 25 MG 24 hr tablet, TAKE 0.5 TABLETS BY MOUTH AT BEDTIME., Disp: 45 tablet, Rfl: 3   Misc. Devices (PULSE OXIMETER) MISC, Monitor oxygen three times per day., Disp: 1 each, Rfl: 0   modafinil (PROVIGIL) 200 MG tablet, Take 1 tablet (200 mg total) by mouth daily., Disp: 30 tablet, Rfl: 0   ondansetron (ZOFRAN) 8 MG tablet, TAKE 1 TABLET BY MOUTH EVERY 8 HOURS AS NEEDED FOR NAUSEA FOR VOMITING, Disp: 90 tablet, Rfl: 2   oseltamivir (TAMIFLU) 75 MG capsule, Take 1 capsule (75 mg total) by mouth 2 (two) times daily., Disp: 10 capsule, Rfl: 0   OXYGEN, Inhale 3-4 L into the lungs continuous., Disp: , Rfl:    pantoprazole (PROTONIX) 40 MG tablet, TAKE 1 TABLET BY MOUTH TWICE A DAY, Disp: 180 tablet, Rfl: 1   Phenylephrine-Acetaminophen (TYLENOL SINUS CONGESTION/PAIN PO), Take 1 tablet by mouth daily as needed (Congestion)., Disp: , Rfl:    sacubitril-valsartan (ENTRESTO) 97-103 MG, Take 1 tablet by mouth 2 (two) times daily., Disp: 60 tablet, Rfl: 2   sertraline (ZOLOFT) 100 MG tablet, TAKE 2 TABLETS BY MOUTH EVERY DAY, Disp: 60 tablet, Rfl: 1   sodium bicarbonate 650 MG tablet, TAKE ONE TABLET BY MOUTH 4 TIMES DAILY, Disp: 120 tablet, Rfl: 5   spironolactone (ALDACTONE) 25 MG tablet, TAKE 1/2 TABLET BY MOUTH EVERY DAY, Disp: 45 tablet, Rfl: 3   triamcinolone (NASACORT) 55 MCG/ACT AERO nasal inhaler, Place 2-3 sprays into the nose daily as needed (allergies (when pollen is very high))., Disp: , Rfl:   Current Facility-Administered Medications:    methylPREDNISolone acetate (DEPO-MEDROL) injection 80 mg, 80 mg, Intramuscular, Once, Breeback, Jade L, PA-C   sodium chloride flush (NS) 0.9 % injection 3 mL, 3 mL, Intravenous, Q12H, Crenshaw, Madolyn Frieze, MD      Objective:   There were no vitals filed for this visit.  Estimated body mass index is 32.92 kg/m as calculated from the following:   Height as of 11/10/23: 5'  11" (1.803 m).   Weight as of 11/10/23: 236 lb (107 kg).  @WEIGHTCHANGE @  There were no vitals filed for this visit.   Physical Exam   General: No distress. Looks well O2 at rest: YES sitting in the car [place] present: no Sitting in wheel chair: no Frail: no Obese: no Neuro: Alert and Oriented x 3. GCS 15. Speech normal Psych: Pleasant       Assessment:       ICD-10-CM   1. ILD (interstitial lung disease) (HCC)  J84.9     2. Chronic systolic congestive heart failure, NYHA class 3 (HCC)  I50.22          Plan:     Patient Instructions  Chronic hypoxemic respiratory failure ILD (interstitial lung disease) (HCC) Goals of Care    ILD versus compared to 2021, 2022 and 2023 but stable since 2024 I think heart failure systolic is also contributing to significant class III dyspnea Pulmonary embolism ruled out February 2025     Plan - shared decision making -- Continue monitoring and supportive care - Do spirometry and DLCO in 3-4 months - Visit with cardiology and consider heart failure consultation  Followup 3-4 months visit but after spirometry and DLCO; 15 min visit   FOLLOWUP Return in about 10 weeks (around 02/04/2024) for 15 min visit, after Cleda Daub and DLCO, with Dr Marchelle Gearing, Face to Face Visit.    SIGNATURE    Dr. Kalman Shan, M.D., F.C.C.P,  Pulmonary and Critical Care Medicine Staff Physician, [facility] Director - Interstitial Lung Disease  Program  Pulmonary Fibrosis [facility] [place], [place], [address]  Pager: [phone], If no answer or between  15:00h - 7:00h: call [phone] Telephone: [phone]  2:34 PM 11/26/2023

## 2023-11-26 NOTE — Telephone Encounter (Signed)
 Task completed. Per patient - he is no longer taking the medication. Please disregard the refill request.

## 2023-11-26 NOTE — Patient Instructions (Addendum)
 Chronic hypoxemic respiratory failure ILD (interstitial lung disease) (HCC) Goals of Care    ILD versus compared to 2021, 2022 and 2023 but stable since 2024 I think heart failure systolic is also contributing to significant class III dyspnea Pulmonary embolism ruled out February 2025     Plan - shared decision making -- Continue monitoring and supportive care - Do spirometry and DLCO in 3-4 months - Visit with cardiology and consider heart failure consultation  Followup 3-4 months visit but after spirometry and DLCO; 15 min visit

## 2023-12-12 ENCOUNTER — Other Ambulatory Visit: Payer: Self-pay | Admitting: Physician Assistant

## 2023-12-12 DIAGNOSIS — M62830 Muscle spasm of back: Secondary | ICD-10-CM

## 2023-12-12 DIAGNOSIS — M545 Low back pain, unspecified: Secondary | ICD-10-CM

## 2023-12-13 ENCOUNTER — Telehealth: Payer: Self-pay | Admitting: Adult Health

## 2023-12-13 ENCOUNTER — Other Ambulatory Visit: Payer: Self-pay

## 2023-12-13 DIAGNOSIS — G4733 Obstructive sleep apnea (adult) (pediatric): Secondary | ICD-10-CM

## 2023-12-13 MED ORDER — MODAFINIL 200 MG PO TABS: 200.0000 mg | ORAL_TABLET | Freq: Every day | ORAL | 0 refills | Status: DC

## 2023-12-13 NOTE — Telephone Encounter (Signed)
 Pt LVM @ 9:35a requesting refill of Modafinil to   [id number] [place], [place] - [address], [place] [address] Phone: [phone]  Fax: [phone]   He said he has been out of meds since last Friday  Next appt (first appt) with Almira Coaster (former pt of [facility])

## 2023-12-13 NOTE — Telephone Encounter (Signed)
 Pended.

## 2023-12-22 ENCOUNTER — Other Ambulatory Visit: Payer: Self-pay | Admitting: Adult Health

## 2023-12-22 DIAGNOSIS — F411 Generalized anxiety disorder: Secondary | ICD-10-CM

## 2023-12-22 DIAGNOSIS — F39 Unspecified mood [affective] disorder: Secondary | ICD-10-CM

## 2023-12-22 DIAGNOSIS — G4733 Obstructive sleep apnea (adult) (pediatric): Secondary | ICD-10-CM | POA: Diagnosis not present

## 2023-12-22 NOTE — Telephone Encounter (Signed)
Has appt 4/17

## 2023-12-23 ENCOUNTER — Ambulatory Visit: Payer: Medicare HMO | Admitting: Adult Health

## 2023-12-24 ENCOUNTER — Encounter: Payer: Self-pay | Admitting: Cardiology

## 2023-12-24 NOTE — Telephone Encounter (Signed)
 Left detailed message for pt's wife, Idamae Maize (ok per [facility]) regarding an appointment to see Dr. Audery Blazing on Monday in [place]. Will send mychart message as well.

## 2023-12-26 NOTE — Telephone Encounter (Signed)
 Apt now rescheduled for July 2025

## 2023-12-27 ENCOUNTER — Encounter: Payer: Self-pay | Admitting: Cardiology

## 2023-12-27 ENCOUNTER — Ambulatory Visit: Admitting: Cardiology

## 2023-12-27 VITALS — BP 160/80 | HR 96 | Ht 71.0 in

## 2023-12-27 DIAGNOSIS — I2510 Atherosclerotic heart disease of native coronary artery without angina pectoris: Secondary | ICD-10-CM

## 2023-12-27 DIAGNOSIS — J849 Interstitial pulmonary disease, unspecified: Secondary | ICD-10-CM

## 2023-12-27 DIAGNOSIS — I10 Essential (primary) hypertension: Secondary | ICD-10-CM | POA: Diagnosis not present

## 2023-12-27 DIAGNOSIS — R0602 Shortness of breath: Secondary | ICD-10-CM

## 2023-12-27 DIAGNOSIS — E782 Mixed hyperlipidemia: Secondary | ICD-10-CM

## 2023-12-27 DIAGNOSIS — I420 Dilated cardiomyopathy: Secondary | ICD-10-CM | POA: Diagnosis not present

## 2023-12-27 MED ORDER — EMPAGLIFLOZIN 10 MG PO TABS: 10.0000 mg | ORAL_TABLET | Freq: Every day | ORAL | 11 refills | Status: DC

## 2023-12-27 MED ORDER — TORSEMIDE 20 MG PO TABS: 20.0000 mg | ORAL_TABLET | Freq: Every day | ORAL | 3 refills | Status: DC

## 2023-12-27 MED ORDER — HYDRALAZINE HCL 25 MG PO TABS
25.0000 mg | ORAL_TABLET | Freq: Three times a day (TID) | ORAL | 3 refills | Status: DC
Start: 2023-12-27 — End: 2024-01-10

## 2023-12-27 NOTE — Progress Notes (Signed)
 HPI: Follow-up CAD.  CPX March 2022 showed submaximal effort, moderate functional impairment due to pulmonary restriction.  High resolution chest CT March 2022 showed interstitial lung disease and aortic atherosclerosis; enlarged pulmonary trunk suggestive of pulmonary hypertension.  Cardiac catheterization March 2022 showed normal left main, minimal irregularities in the LAD, 2 small diagonals, circumflex, 2 obtuse marginals, posterior lateral, PDA and nondominant right coronary artery; ejection fraction 30 to 35% with mild left ventricular enlargement.  Echocardiogram June 2023 showed ejection fraction 40 to 45%, grade 1 diastolic dysfunction, moderate left atrial enlargement, mild mitral regurgitation.  Right heart catheterization June 2023 showed cardiac output 6.59 L/min, cardiac index 2.96 L/min, RA pressure 11, PA pressure 25/12 and pulmonary capillary wedge pressure 8. CTA January 2024 showed pneumonitis and atheromatous changes of the thoracic aorta but no aneurysm.     Patient admitted [facility] March 2024 with STEMI.  Cardiac catheterization revealed 95 to 99% mid circumflex, 30% LAD, 50% PDA.  Patient had PCI of the left circumflex with drug-eluting stent.  Echocardiogram repeated February 23, 2023 and showed ejection fraction 25%, hypokinesis of the anterolateral and inferoseptal walls, severe left ventricular enlargement, mild left ventricular hypertrophy, grade 1 diastolic dysfunction, mild left atrial enlargement, mild mitral regurgitation and mildly dilated aortic root at 42 mm.  High-resolution chest CT June 2024 showed interstitial lung disease but no aneurysm.  Had biventricular ICD placed August 2024.  Carotid Dopplers February 2025 showed 40 to 59% right and 1 to 39% left stenosis.  CTA February 2025 showed no pulmonary embolus.  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  Seen recently by pulmonary with increasing dyspnea felt  not likely secondary to pulmonary disease and cardiology asked to evaluate.  Added to my schedule today.  Since last seen patient has dyspnea with minimal activities.  Question orthopnea.  He complains of a cough.  He denies syncope.  He has occasional pain in his chest for 1 hour at a time not related to activities.  He has diffuse myalgias/arthralgias and also complains of fatigue.  Current Outpatient Medications  Medication Sig Dispense Refill   AMBULATORY NON FORMULARY MEDICATION Continuous positive airway pressure (CPAP) machine set at autotitration of H2O pressure, with all supplemental supplies as needed. 1 each 0   aspirin  EC 81 MG tablet Take 81 mg by mouth daily. Swallow whole.     busPIRone  (BUSPAR ) 15 MG tablet TAKE 1 TABLET BY MOUTH TWICE A DAY 60 tablet 1   chlorthalidone  (HYGROTON ) 25 MG tablet TAKE 1 TABLET BY MOUTH EVERY DAY 90 tablet 1   colestipol  (COLESTID ) 1 g tablet TAKE 1 TABLET BY MOUTH EVERY DAY 90 tablet 1   dicyclomine  (BENTYL ) 20 MG tablet TAKE 1 TABLET BY MOUTH THREE TIMES A DAY 270 tablet 1   ezetimibe  (ZETIA ) 10 MG tablet TAKE 1 TABLET BY MOUTH EVERYDAY AT BEDTIME 90 tablet 2   finasteride  (PROSCAR ) 5 MG tablet TAKE 1 TABLET (5 MG TOTAL) BY MOUTH DAILY. 90 tablet 2   ibuprofen  (ADVIL ) 800 MG tablet TAKE 1 TABLET BY MOUTH EVERY 8 HOURS AS NEEDED 90 tablet 5   ipratropium-albuterol  (DUONEB) 0.5-2.5 (3) MG/3ML SOLN TAKE 3 MLS BY NEBULIZATION EVERY 2 (TWO) HOURS AS NEEDED (WHEEZE, SHORTNESS OF BREATH). 90 mL 3   KLOR-CON  M10 10 MEQ tablet TAKE 1 TABLET BY MOUTH TWICE A DAY 180 tablet 1   loratadine  (CLARITIN ) 10 MG tablet Take 10 mg by mouth in the morning.  metoprolol  succinate (TOPROL -XL) 25 MG 24 hr tablet TAKE 0.5 TABLETS BY MOUTH AT BEDTIME. 45 tablet 3   Misc. Devices (PULSE OXIMETER) MISC Monitor oxygen  three times per day. 1 each 0   modafinil  (PROVIGIL ) 200 MG tablet Take 1 tablet (200 mg total) by mouth daily. 30 tablet 0   ondansetron  (ZOFRAN ) 8 MG tablet  TAKE 1 TABLET BY MOUTH EVERY 8 HOURS AS NEEDED FOR NAUSEA FOR VOMITING 90 tablet 2   OXYGEN  Inhale 3-4 L into the lungs continuous.     pantoprazole  (PROTONIX ) 40 MG tablet TAKE 1 TABLET BY MOUTH TWICE A DAY 180 tablet 1   Phenylephrine -Acetaminophen  (TYLENOL  SINUS CONGESTION/PAIN PO) Take 1 tablet by mouth daily as needed (Congestion).     sacubitril -valsartan  (ENTRESTO ) 97-103 MG Take 1 tablet by mouth 2 (two) times daily. 60 tablet 2   sertraline  (ZOLOFT ) 100 MG tablet TAKE 2 TABLETS BY MOUTH EVERY DAY 60 tablet 1   sodium bicarbonate  650 MG tablet TAKE ONE TABLET BY MOUTH 4 TIMES DAILY 120 tablet 5   spironolactone  (ALDACTONE ) 25 MG tablet TAKE 1/2 TABLET BY MOUTH EVERY DAY 45 tablet 3   triamcinolone (NASACORT) 55 MCG/ACT AERO nasal inhaler Place 2-3 sprays into the nose daily as needed (allergies (when pollen is very high)).     albuterol  (VENTOLIN  HFA) 108 (90 Base) MCG/ACT inhaler INHALE 2 PUFFS BY MOUTH EVERY 6 HOURS AS NEEDED (Patient not taking: Reported on 12/27/2023) 18 each 3   clopidogrel  (PLAVIX ) 75 MG tablet TAKE 1 TABLET BY MOUTH EVERY DAY (Patient not taking: Reported on 12/27/2023) 90 tablet 3   Current Facility-Administered Medications  Medication Dose Route Frequency Provider Last Rate Last Admin   methylPREDNISolone  acetate (DEPO-MEDROL ) injection 80 mg  80 mg Intramuscular Once Breeback, Jade L, PA-C       sodium chloride  flush (NS) 0.9 % injection 3 mL  3 mL Intravenous Q12H Lenise Quince, MD         Past Medical History:  Diagnosis Date   Arthritis    lower back   Bipolar disorder (HCC)    GERD (gastroesophageal reflux disease)    Hyperlipidemia    Hypertension    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    Past Surgical History:  Procedure Laterality Date   ANKLE FRACTURE SURGERY Left    APPENDECTOMY     BIOPSY  11/18/2021   Procedure: BIOPSY;  Surgeon: Annis Kinder, DO;  Location: [facility];  Service: Gastroenterology;;   EGD and COLON   BIV ICD INSERTION CRT-D N/A 04/14/2023   Procedure: BIV ICD INSERTION CRT-D;  Surgeon: Lei Pump, MD;  Location: [facility] INVASIVE CV LAB;  Service: Cardiovascular;  Laterality: N/A;   CERVICAL FUSION     CHOLECYSTECTOMY     COLON SURGERY     COLONOSCOPY WITH PROPOFOL  N/A 11/18/2021   Procedure: COLONOSCOPY WITH PROPOFOL ;  Surgeon: Annis Kinder, DO;  Location: [facility];  Service: Gastroenterology;  Laterality: N/A;   ESOPHAGOGASTRODUODENOSCOPY (EGD) WITH PROPOFOL  N/A 11/18/2021   Procedure: ESOPHAGOGASTRODUODENOSCOPY (EGD) WITH PROPOFOL ;  Surgeon: Annis Kinder, DO;  Location: [facility];  Service: Gastroenterology;  Laterality: N/A;   HERNIA REPAIR     POLYPECTOMY  11/18/2021   Procedure: POLYPECTOMY;  Surgeon: Annis Kinder, DO;  Location: [facility];  Service: Gastroenterology;;   RIGHT HEART CATH N/A 02/13/2022   Procedure: RIGHT HEART CATH;  Surgeon: Odie Benne, MD;  Location: [facility] INVASIVE CV LAB;  Service: Cardiovascular;  Laterality: N/A;    Social History   Socioeconomic History   Marital status: Married    Spouse name: Dawn   Number of children: 1   Years of education: 14   Highest education level: Some college, no degree  Occupational History   Occupation: Disabled  Tobacco Use   Smoking status: Former    Current packs/day: 0.00    Average packs/day: 1 pack/day for 28.0 years (28.0 ttl pk-yrs)    Types: Cigarettes    Start date: 22    Quit date: 2010    Years since quitting: 15.3   Smokeless tobacco: Never  Vaping Use   Vaping status: Never Used  Substance and Sexual Activity   Alcohol use: No   Drug use: No    Comment: 04/06/2018 Patient's wife wanted us  to be very aware that her husband is a recovering narcotic abuser   Sexual activity: Yes  Other Topics Concern   Not on file  Social History Narrative   Lives with his wife, daughter, son-in-law and 2 two grandchildren. Likes to spend to time with  grandchildren. Does not exercise due to back issues in the past and respiratory problems.   Social Drivers of Corporate investment banker Strain: Low Risk  (02/04/2022)   Overall Financial Resource Strain (CARDIA)    Difficulty of Paying Living Expenses: Not hard at all  Food Insecurity: Low Risk  (11/16/2022)   [facility]   Hunger Vital Sign    Worried About Running Out of Food in the Last Year: Never true    Ran Out of Food in the Last Year: Never true  Transportation Needs: Unmet Transportation Needs (11/16/2022)   [facility]   Transportation    In the past 12 months, has lack of reliable transportation kept you from medical appointments, meetings, work or from getting things needed for daily living? : Yes  Physical Activity: Inactive (02/04/2022)   Exercise Vital Sign    Days of Exercise per Week: 0 days    Minutes of Exercise per Session: 0 min  Stress: No Stress Concern Present (02/04/2022)   [employer] of Occupational Health - Occupational Stress Questionnaire    Feeling of Stress : Not at all  Social Connections: Moderately Integrated (02/04/2022)   Social Connection and Isolation Panel [NHANES]    Frequency of Communication with Friends and Family: Once a week    Frequency of Social Gatherings with Friends and Family: More than three times a week    Attends Religious Services: More than 4 times per year    Active Member of [employer] or Organizations: No    Attends Banker Meetings: Never    Marital Status: Married  Catering manager Violence: Not At Risk (02/04/2022)   Humiliation, Afraid, Rape, and Kick questionnaire    Fear of Current or Ex-Partner: No    Emotionally Abused: No    Physically Abused: No    Sexually Abused: No    Family History  Problem Relation Age of Onset   Cancer Mother        breast   Depression Mother    Hyperlipidemia Mother    Dementia Mother    Alcohol abuse Father     Cancer Father        skin   Hyperlipidemia Father    Alzheimer's disease Father    Cancer Maternal Grandfather    Cancer Paternal Grandmother    Pancreatic cancer Paternal Uncle  Colon cancer Neg Hx    Esophageal cancer Neg Hx    Stomach cancer Neg Hx     ROS: Myalgias/arthralgias and fatigue but no fevers or chills, productive cough, hemoptysis, dysphasia, odynophagia, melena, hematochezia, dysuria, hematuria, rash, seizure activity, PND, pedal edema, claudication. Remaining systems are negative.  Physical Exam: Well-developed well-nourished in no acute distress.  Skin is warm and dry.  HEENT is normal.  Neck is supple.  Chest with dry crackles at the base bilaterally Cardiovascular exam is regular rate and rhythm.  Abdominal exam nontender or distended. No masses palpated. Extremities show no edema. neuro grossly intact  EKG Interpretation Date/Time:  Monday December 27 2023 14:00:11 EDT Ventricular Rate:  96 PR Interval:  152 QRS Duration:  134 QT Interval:  378 QTC Calculation: 477 R Axis:   -14  Text Interpretation: Atrial-sensed ventricular-paced rhythm with Fusion complexes When compared with ECG of 19-Jul-2023 14:58, Fusion complexes are now Present Vent. rate has increased BY   8 BPM Confirmed by Alexandria Angel ([id number]) on 12/27/2023 2:17:17 PM      A/P  1 worsening dyspnea on exertion-patient does not appear to be significantly volume overloaded on examination.  I think his dyspnea is likely predominantly related to progressive interstitial lung disease.  We will discontinue chlorthalidone  and treat with Demadex  20 mg daily for any component of CHF.  In 1 week check potassium, renal function and BNP.  We discussed possible right heart catheterization for definitive evaluation but he declined.  2 coronary artery disease-continue aspirin .  Patient is intolerant to statins.  He has had some chest pain that increases with cough.  He also feels it may be related to  indigestion.  He declined further ischemia evaluation.  3 hypertension-patient's blood pressure is elevated.  Add hydralazine  25 mg p.o. 3 times daily and follow.  4 hyperlipidemia-he is intolerant to statins and did not tolerate Repatha .  Continue Zetia .  5 chronic combined systolic/diastolic congestive heart failure-patient appears to be euvolemic on examination today.  Will check BNP.  Continue Entresto , Toprol  and spironolactone .  Add Jardiance  10 mg daily.  Check potassium and renal function in 1 week.  6 ICD-Per electrophysiology.  Patient ask about having device turned off given the progressive nature of his lung disease.  I explained that we could have EP performed this but he would like to discuss further with his wife.  7 obstructive sleep apnea-continue CPAP.  8 interstitial lung disease-managed by pulmonary.  I think this is the predominant issue.  I think his long-term prognosis is poor.  Alexandria Angel, MD

## 2023-12-27 NOTE — Patient Instructions (Signed)
 Medication Instructions:   STOP CHLORTHALIDONE    START HYDRALAZINE  25 MG ONE TABLET THREE TIMES DAILY  START JARDIANCE  10 MG ONCE DAILY  START TORSEMIDE  20 MG ONCE DAILY  *If you need a refill on your cardiac medications before your next appointment, please call your pharmacy*  Lab Work:  Your physician recommends that you return for lab work in: ONE WEEK  If you have labs (blood work) drawn today and your tests are completely normal, you will receive your results only by: [employer] (if you have MyChart) OR A paper copy in the mail If you have any lab test that is abnormal or we need to change your treatment, we will call you to review the results.   Follow-Up: At [facility], you and your health needs are our priority.  As part of our continuing mission to provide you with exceptional heart care, our providers are all part of one team.  This team includes your primary Cardiologist (physician) and Advanced Practice Providers or APPs (Physician Assistants and Nurse Practitioners) who all work together to provide you with the care you need, when you need it.  Your next appointment:   3 week(s)  Provider:   Alexandria Angel, MD

## 2023-12-28 NOTE — Progress Notes (Signed)
 HPI: Follow-up CAD.  CPX March 2022 showed submaximal effort, moderate functional impairment due to pulmonary restriction.  High resolution chest CT March 2022 showed interstitial lung disease and aortic atherosclerosis; enlarged pulmonary trunk suggestive of pulmonary hypertension.  Cardiac catheterization March 2022 showed normal left main, minimal irregularities in the LAD, 2 small diagonals, circumflex, 2 obtuse marginals, posterior lateral, PDA and nondominant right coronary artery; ejection fraction 30 to 35% with mild left ventricular enlargement.  Echocardiogram June 2023 showed ejection fraction 40 to 45%, grade 1 diastolic dysfunction, moderate left atrial enlargement, mild mitral regurgitation.  Right heart catheterization June 2023 showed cardiac output 6.59 L/min, cardiac index 2.96 L/min, RA pressure 11, PA pressure 25/12 and pulmonary capillary wedge pressure 8. CTA January 2024 showed pneumonitis and atheromatous changes of the thoracic aorta but no aneurysm.     Patient admitted to [facility] March 2024 with STEMI.  Cardiac catheterization revealed 95 to 99% mid circumflex, 30% LAD, 50% PDA.  Patient had PCI of the left circumflex with drug-eluting stent.  Echocardiogram repeated February 23, 2023 and showed ejection fraction 25%, hypokinesis of the anterolateral and inferoseptal walls, severe left ventricular enlargement, mild left ventricular hypertrophy, grade 1 diastolic dysfunction, mild left atrial enlargement, mild mitral regurgitation and mildly dilated aortic root at 42 mm.  High-resolution chest CT June 2024 showed interstitial lung disease but no aneurysm.  Had biventricular ICD placed August 2024.  Carotid Dopplers February 2025 showed 40 to 59% right and 1 to 39% left stenosis.  CTA February 2025 showed no pulmonary embolus.  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  Patient recently seen in the office with worsening  dyspnea on exertion.  Pulmonary thought there may be a component of congestive heart failure although this was not evident on examination.  We did slightly increase his diuretic and added Jardiance  10 mg daily.  Since last seen he continues to have dyspnea on exertion.  It is unchanged since increasing his diuretics.  No orthopnea, PND, pedal edema.  He has some chest discomfort relieved with Tums that he states is different than his infarct pain.  He feels that his indigestion.  Current Outpatient Medications  Medication Sig Dispense Refill   albuterol  (VENTOLIN  HFA) 108 (90 Base) MCG/ACT inhaler INHALE 2 PUFFS BY MOUTH EVERY 6 HOURS AS NEEDED (Patient not taking: Reported on 12/27/2023) 18 each 3   AMBULATORY NON FORMULARY MEDICATION Continuous positive airway pressure (CPAP) machine set at autotitration of H2O pressure, with all supplemental supplies as needed. 1 each 0   aspirin  EC 81 MG tablet Take 81 mg by mouth daily. Swallow whole.     busPIRone  (BUSPAR ) 15 MG tablet TAKE 1 TABLET BY MOUTH TWICE A DAY 60 tablet 1   clopidogrel  (PLAVIX ) 75 MG tablet TAKE 1 TABLET BY MOUTH EVERY DAY (Patient not taking: Reported on 12/27/2023) 90 tablet 3   colestipol  (COLESTID ) 1 g tablet TAKE 1 TABLET BY MOUTH EVERY DAY 90 tablet 1   dicyclomine  (BENTYL ) 20 MG tablet TAKE 1 TABLET BY MOUTH THREE TIMES A DAY 270 tablet 1   empagliflozin  (JARDIANCE ) 10 MG TABS tablet Take 1 tablet (10 mg total) by mouth daily before breakfast. 30 tablet 11   ezetimibe  (ZETIA ) 10 MG tablet TAKE 1 TABLET BY MOUTH EVERYDAY AT BEDTIME 90 tablet 2   finasteride  (PROSCAR ) 5 MG tablet TAKE 1 TABLET (5 MG TOTAL) BY MOUTH DAILY. 90 tablet 2   hydrALAZINE  (APRESOLINE ) 25 MG  tablet Take 1 tablet (25 mg total) by mouth 3 (three) times daily. 270 tablet 3   ibuprofen  (ADVIL ) 800 MG tablet TAKE 1 TABLET BY MOUTH EVERY 8 HOURS AS NEEDED 90 tablet 5   ipratropium-albuterol  (DUONEB) 0.5-2.5 (3) MG/3ML SOLN TAKE 3 MLS BY NEBULIZATION EVERY 2 (TWO)  HOURS AS NEEDED (WHEEZE, SHORTNESS OF BREATH). 90 mL 3   KLOR-CON  M10 10 MEQ tablet TAKE 1 TABLET BY MOUTH TWICE A DAY 180 tablet 1   loratadine  (CLARITIN ) 10 MG tablet Take 10 mg by mouth in the morning.     metoprolol  succinate (TOPROL -XL) 25 MG 24 hr tablet TAKE 0.5 TABLETS BY MOUTH AT BEDTIME. 45 tablet 3   Misc. Devices (PULSE OXIMETER) MISC Monitor oxygen  three times per day. 1 each 0   modafinil  (PROVIGIL ) 200 MG tablet Take 1 tablet (200 mg total) by mouth daily. 30 tablet 0   ondansetron  (ZOFRAN ) 8 MG tablet TAKE 1 TABLET BY MOUTH EVERY 8 HOURS AS NEEDED FOR NAUSEA FOR VOMITING 90 tablet 2   OXYGEN  Inhale 3-4 L into the lungs continuous.     pantoprazole  (PROTONIX ) 40 MG tablet TAKE 1 TABLET BY MOUTH TWICE A DAY 180 tablet 1   Phenylephrine -Acetaminophen  (TYLENOL  SINUS CONGESTION/PAIN PO) Take 1 tablet by mouth daily as needed (Congestion).     sacubitril -valsartan  (ENTRESTO ) 97-103 MG Take 1 tablet by mouth 2 (two) times daily. 60 tablet 2   sertraline  (ZOLOFT ) 100 MG tablet TAKE 2 TABLETS BY MOUTH EVERY DAY 60 tablet 1   sodium bicarbonate  650 MG tablet TAKE ONE TABLET BY MOUTH 4 TIMES DAILY 120 tablet 5   spironolactone  (ALDACTONE ) 25 MG tablet TAKE 1/2 TABLET BY MOUTH EVERY DAY 45 tablet 3   torsemide  (DEMADEX ) 20 MG tablet Take 1 tablet (20 mg total) by mouth daily. 90 tablet 3   triamcinolone (NASACORT) 55 MCG/ACT AERO nasal inhaler Place 2-3 sprays into the nose daily as needed (allergies (when pollen is very high)).     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    Past Surgical History:  Procedure Laterality Date   ANKLE FRACTURE SURGERY Left    APPENDECTOMY     BIOPSY  11/18/2021   Procedure: BIOPSY;  Surgeon: Annis Kinder, DO;  Location: [facility];  Service: Gastroenterology;;  EGD and COLON   BIV ICD INSERTION CRT-D N/A 04/14/2023   Procedure: BIV ICD INSERTION CRT-D;  Surgeon: Lei Pump, MD;  Location: [facility] INVASIVE CV LAB;  Service: Cardiovascular;  Laterality: N/A;   CERVICAL FUSION     CHOLECYSTECTOMY     COLON SURGERY     COLONOSCOPY WITH PROPOFOL  N/A 11/18/2021   Procedure: COLONOSCOPY WITH PROPOFOL ;  Surgeon: Annis Kinder, DO;  Location: [facility];  Service: Gastroenterology;  Laterality: N/A;   ESOPHAGOGASTRODUODENOSCOPY (EGD) WITH PROPOFOL  N/A 11/18/2021   Procedure: ESOPHAGOGASTRODUODENOSCOPY (EGD) WITH PROPOFOL ;  Surgeon: Annis Kinder, DO;  Location: [facility];  Service: Gastroenterology;  Laterality: N/A;   HERNIA REPAIR     POLYPECTOMY  11/18/2021   Procedure: POLYPECTOMY;  Surgeon: Annis Kinder, DO;  Location: [facility];  Service: Gastroenterology;;   RIGHT HEART CATH N/A 02/13/2022   Procedure: RIGHT HEART CATH;  Surgeon: Odie Benne, MD;  Location: [facility] INVASIVE CV LAB;  Service: Cardiovascular;  Laterality: N/A;    Social History   Socioeconomic History   Marital status: Married    Spouse name: Dawn   Number of children: 1   Years of education: 14   Highest education level: Some college, no degree  Occupational History   Occupation: Disabled  Tobacco Use   Smoking status: Former    Current packs/day: 0.00    Average packs/day: 1 pack/day for 28.0 years (28.0 ttl pk-yrs)    Types: Cigarettes    Start date: 31    Quit date: 2010    Years since quitting: 15.3   Smokeless tobacco: Never  Vaping Use   Vaping status: Never Used  Substance and Sexual Activity    Alcohol use: No   Drug use: No    Comment: 04/06/2018 Patient's wife wanted us  to be very aware that her husband is a recovering narcotic abuser   Sexual activity: Yes  Other Topics Concern   Not on file  Social History Narrative   Lives with his wife, daughter, son-in-law and 2 two grandchildren. Likes to spend to time with grandchildren. Does not exercise due to back issues in the past and respiratory problems.   Social Drivers of Corporate investment banker Strain: Low Risk  (02/04/2022)   Overall Financial Resource Strain (CARDIA)    Difficulty of Paying Living Expenses: Not hard at all  Food Insecurity: Low Risk  (11/16/2022)   [facility]   Hunger Vital Sign    Worried About Running Out of Food in the Last Year: Never true    Ran Out of Food in the Last Year: Never true  Transportation Needs: Unmet Transportation Needs (11/16/2022)   [facility]   Transportation    In the past 12 months, has lack of reliable transportation kept you from medical appointments, meetings, work or from getting things needed for daily living? : Yes  Physical Activity: Inactive (02/04/2022)   Exercise Vital Sign    Days of Exercise per Week: 0 days    Minutes of Exercise per Session: 0 min  Stress: No Stress Concern Present (02/04/2022)   [employer] of Occupational Health - Occupational Stress Questionnaire    Feeling of Stress : Not at all  Social Connections: Moderately Integrated (02/04/2022)   Social Connection and Isolation Panel [NHANES]    Frequency of Communication with Friends and Family: Once a week    Frequency of Social Gatherings with Friends and Family: More than three times a week    Attends Religious Services: More than 4 times per year    Active Member of [employer] or Organizations: No    Attends Banker Meetings: Never    Marital Status: Married  Catering manager Violence: Not At Risk (02/04/2022)    Humiliation, Afraid, Rape, and Kick questionnaire    Fear of Current or Ex-Partner: No    Emotionally Abused: No    Physically Abused: No    Sexually Abused: No    Family History  Problem Relation Age of Onset   Cancer Mother  breast   Depression Mother    Hyperlipidemia Mother    Dementia Mother    Alcohol abuse Father    Cancer Father        skin   Hyperlipidemia Father    Alzheimer's disease Father    Cancer Maternal Grandfather    Cancer Paternal Grandmother    Pancreatic cancer Paternal Uncle    Colon cancer Neg Hx    Esophageal cancer Neg Hx    Stomach cancer Neg Hx     ROS: no fevers or chills, productive cough, hemoptysis, dysphasia, odynophagia, melena, hematochezia, dysuria, hematuria, rash, seizure activity, orthopnea, PND, pedal edema, claudication. Remaining systems are negative.  Physical Exam: Well-developed well-nourished in no acute distress.  Skin is warm and dry.  HEENT is normal.  Neck is supple.  Chest is clear to auscultation with normal expansion.  Cardiovascular exam is regular rate and rhythm.  Abdominal exam nontender or distended. No masses palpated. Extremities show no edema. neuro grossly intact  EKG Interpretation Date/Time:  Monday Jan 10 2024 12:44:32 EDT Ventricular Rate:  92 PR Interval:  142 QRS Duration:  140 QT Interval:  378 QTC Calculation: 467 R Axis:   -52  Text Interpretation: Atrial-sensed ventricular-paced rhythm Confirmed by Alexandria Angel ([id number]) on 01/10/2024 12:45:41 PM    A/P  1 worsening dyspnea on exertion-patient's symptoms are unchanged.  I think this is likely secondary to progressive interstitial lung disease.  Will continue low-dose diuretic.  He previously declined right heart catheterization.  2 coronary artery disease-continue aspirin .  He is intolerant to statins.  He has had occasional chest pain that he feels is likely indigestion.    3 chronic systolic congestive heart failure-he appears to  be euvolemic on examination.  Will continue Entresto , Toprol , spironolactone  and Jardiance .  Continue present dose of Demadex .    4 hyperlipidemia-intolerant to statins and also did not tolerate Repatha .  Continue Zetia .  5 hypertension-patient's blood pressure remains elevated.  Increase hydralazine  to 50 mg p.o. 3 times daily and follow.  6 ICD-managed by electrophysiology.  7 obstructive sleep apnea-continue CPAP.  8 interstitial lung disease-per pulmonary.  Alexandria Angel, MD

## 2023-12-30 ENCOUNTER — Ambulatory Visit (INDEPENDENT_AMBULATORY_CARE_PROVIDER_SITE_OTHER): Admitting: Sports Medicine

## 2023-12-30 ENCOUNTER — Ambulatory Visit

## 2023-12-30 DIAGNOSIS — S46012A Strain of muscle(s) and tendon(s) of the rotator cuff of left shoulder, initial encounter: Secondary | ICD-10-CM

## 2023-12-30 DIAGNOSIS — M7542 Impingement syndrome of left shoulder: Secondary | ICD-10-CM | POA: Insufficient documentation

## 2023-12-30 DIAGNOSIS — Z043 Encounter for examination and observation following other accident: Secondary | ICD-10-CM | POA: Diagnosis not present

## 2023-12-30 DIAGNOSIS — M75102 Unspecified rotator cuff tear or rupture of left shoulder, not specified as traumatic: Secondary | ICD-10-CM | POA: Insufficient documentation

## 2023-12-30 DIAGNOSIS — S4992XA Unspecified injury of left shoulder and upper arm, initial encounter: Secondary | ICD-10-CM

## 2023-12-30 MED ORDER — DICLOFENAC SODIUM 75 MG PO TBEC: 75.0000 mg | DELAYED_RELEASE_TABLET | Freq: Two times a day (BID) | ORAL | 3 refills | Status: DC

## 2023-12-30 NOTE — Progress Notes (Signed)
    Procedures performed today:    None.  Independent interpretation of notes and tests performed by another provider:   None.  Brief History, Exam, Impression, and Recommendations:    Rotator cuff tear, left Pleasant 63 year old male, recent fall with subsequent inability to abduct and externally rotate the left shoulder. On exam he has excellent passive range of motion, he has 0 strength abduction and external rotation, he does have good internal rotation. Suspect massive rotator cuff tear. Jake [name] is somewhat complicated, he does have severe congestive heart failure, he has an MRI safe ICD/pacer. He has pulmonary fibrosis. We are going to proceed with x-rays, MRI of the shoulder. 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 Adding aggressive home physical therapy.    ____________________________________________ Joselyn Nicely. Sandy Crumb, M.D., ABFM., CAQSM., AME. Primary Care and Sports Medicine [facility]  Adjunct Professor of [employer] [facility] Medicine  [employer]  Restaurant manager, fast food

## 2023-12-30 NOTE — Assessment & Plan Note (Signed)
 Pleasant 63 year old male, recent fall with subsequent inability to abduct and externally rotate the left shoulder. On exam he has excellent passive range of motion, he has 0 strength abduction and external rotation, he does have good internal rotation. Suspect massive rotator cuff tear. Jake [name] is somewhat complicated, he does have severe congestive heart failure, he has an MRI safe ICD/pacer. He has pulmonary fibrosis. We are going to proceed with x-rays, MRI of the shoulder. 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 Adding aggressive home physical therapy.

## 2023-12-31 ENCOUNTER — Telehealth: Payer: Self-pay

## 2023-12-31 NOTE — Telephone Encounter (Signed)
 MRI ordered for the patient. The location must be [facility] due to patient having a pacemaker. Thanks in advance.

## 2023-12-31 NOTE — Telephone Encounter (Signed)
 Thank you, will call and update authorization.

## 2024-01-06 ENCOUNTER — Other Ambulatory Visit: Payer: Self-pay | Admitting: Cardiology

## 2024-01-06 DIAGNOSIS — R0602 Shortness of breath: Secondary | ICD-10-CM | POA: Diagnosis not present

## 2024-01-07 LAB — BASIC METABOLIC PANEL WITH GFR
BUN/Creatinine Ratio: 27 — ABNORMAL HIGH (ref 10–24)
BUN: 29 mg/dL — ABNORMAL HIGH (ref 8–27)
CO2: 26 mmol/L (ref 20–29)
Calcium: 9.5 mg/dL (ref 8.6–10.2)
Chloride: 94 mmol/L — ABNORMAL LOW (ref 96–106)
Creatinine, Ser: 1.06 mg/dL (ref 0.76–1.27)
Glucose: 85 mg/dL (ref 70–99)
Potassium: 4.4 mmol/L (ref 3.5–5.2)
Sodium: 136 mmol/L (ref 134–144)
eGFR: 79 mL/min/{1.73_m2} (ref >59)

## 2024-01-07 LAB — SPECIMEN STATUS REPORT

## 2024-01-07 LAB — PRO B NATRIURETIC PEPTIDE: NT-Pro BNP: 204 pg/mL (ref 0–210)

## 2024-01-08 ENCOUNTER — Other Ambulatory Visit: Payer: Self-pay | Admitting: Adult Health

## 2024-01-08 DIAGNOSIS — G4733 Obstructive sleep apnea (adult) (pediatric): Secondary | ICD-10-CM

## 2024-01-10 ENCOUNTER — Ambulatory Visit: Admitting: Cardiology

## 2024-01-10 ENCOUNTER — Encounter: Payer: Self-pay | Admitting: Cardiology

## 2024-01-10 VITALS — BP 122/87 | HR 67 | Ht 71.0 in

## 2024-01-10 DIAGNOSIS — I420 Dilated cardiomyopathy: Secondary | ICD-10-CM | POA: Diagnosis not present

## 2024-01-10 DIAGNOSIS — E782 Mixed hyperlipidemia: Secondary | ICD-10-CM | POA: Diagnosis not present

## 2024-01-10 DIAGNOSIS — I2510 Atherosclerotic heart disease of native coronary artery without angina pectoris: Secondary | ICD-10-CM | POA: Diagnosis not present

## 2024-01-10 DIAGNOSIS — I10 Essential (primary) hypertension: Secondary | ICD-10-CM

## 2024-01-10 DIAGNOSIS — J849 Interstitial pulmonary disease, unspecified: Secondary | ICD-10-CM

## 2024-01-10 MED ORDER — HYDRALAZINE HCL 50 MG PO TABS: 50.0000 mg | ORAL_TABLET | Freq: Three times a day (TID) | ORAL | 3 refills | Status: DC

## 2024-01-10 NOTE — Patient Instructions (Addendum)
 INCREASE HYDRALAZINE  TO 50 MG THREE DAILY= 2 OF THE 25 MG TABLETS THREE TIMES DAILY  Follow-Up: At [facility], you and your health needs are our priority.  As part of our continuing mission to provide you with exceptional heart care, our providers are all part of one team.  This team includes your primary Cardiologist (physician) and Advanced Practice Providers or APPs (Physician Assistants and Nurse Practitioners) who all work together to provide you with the care you need, when you need it.  Your next appointment:   4 month(s)  Provider:   Alexandria Angel, MD

## 2024-01-14 ENCOUNTER — Ambulatory Visit (INDEPENDENT_AMBULATORY_CARE_PROVIDER_SITE_OTHER): Payer: Medicare HMO

## 2024-01-14 DIAGNOSIS — R55 Syncope and collapse: Secondary | ICD-10-CM | POA: Diagnosis not present

## 2024-01-14 LAB — CUP PACEART REMOTE DEVICE CHECK
Battery Remaining Longevity: 117 mo
Battery Voltage: 3.02 V
Brady Statistic RV Percent Paced: 16.42 %
Date Time Interrogation Session: 20250508213933
HighPow Impedance: 67 Ohm
Lead Channel Impedance Value: 1026 Ohm
Lead Channel Impedance Value: 304 Ohm
Lead Channel Impedance Value: 361 Ohm
Lead Channel Impedance Value: 361 Ohm
Lead Channel Impedance Value: 399 Ohm
Lead Channel Impedance Value: 513 Ohm
Lead Channel Impedance Value: 551 Ohm
Lead Channel Impedance Value: 608 Ohm
Lead Channel Impedance Value: 779 Ohm
Lead Channel Impedance Value: 817 Ohm
Lead Channel Impedance Value: 874 Ohm
Lead Channel Impedance Value: 931 Ohm
Lead Channel Impedance Value: 969 Ohm
Lead Channel Pacing Threshold Amplitude: 0.5 V
Lead Channel Pacing Threshold Amplitude: 0.5 V
Lead Channel Pacing Threshold Amplitude: 1.25 V
Lead Channel Pacing Threshold Pulse Width: 0.4 ms
Lead Channel Pacing Threshold Pulse Width: 0.4 ms
Lead Channel Pacing Threshold Pulse Width: 0.8 ms
Lead Channel Sensing Intrinsic Amplitude: 24.5 mV
Lead Channel Sensing Intrinsic Amplitude: 4.1 mV
Lead Channel Setting Pacing Amplitude: 1 V
Lead Channel Setting Pacing Amplitude: 1.5 V
Lead Channel Setting Pacing Amplitude: 1.75 V
Lead Channel Setting Pacing Pulse Width: 0.4 ms
Lead Channel Setting Pacing Pulse Width: 0.8 ms
Lead Channel Setting Sensing Sensitivity: 0.3 mV
Zone Setting Status: 755011
Zone Setting Status: 755011
Zone Setting Status: 755011

## 2024-01-20 ENCOUNTER — Ambulatory Visit: Admitting: Adult Health

## 2024-01-26 DIAGNOSIS — J849 Interstitial pulmonary disease, unspecified: Secondary | ICD-10-CM | POA: Diagnosis not present

## 2024-01-27 ENCOUNTER — Other Ambulatory Visit: Payer: Self-pay | Admitting: Physician Assistant

## 2024-01-27 DIAGNOSIS — R110 Nausea: Secondary | ICD-10-CM

## 2024-01-28 ENCOUNTER — Other Ambulatory Visit: Payer: Self-pay | Admitting: Physician Assistant

## 2024-01-28 ENCOUNTER — Telehealth: Payer: Self-pay | Admitting: Physician Assistant

## 2024-01-28 DIAGNOSIS — R110 Nausea: Secondary | ICD-10-CM

## 2024-01-28 DIAGNOSIS — J9611 Chronic respiratory failure with hypoxia: Secondary | ICD-10-CM

## 2024-01-28 DIAGNOSIS — J849 Interstitial pulmonary disease, unspecified: Secondary | ICD-10-CM

## 2024-01-28 DIAGNOSIS — I5022 Chronic systolic (congestive) heart failure: Secondary | ICD-10-CM

## 2024-01-28 DIAGNOSIS — J209 Acute bronchitis, unspecified: Secondary | ICD-10-CM

## 2024-01-28 NOTE — Telephone Encounter (Signed)
 Copied from CRM [id number]. Topic: Clinical - Prescription Issue >> Jan 28, 2024  5:00 PM Tiffany H wrote: Reason for CRM: Patient called to request a refill for Zofran . Patient's Zofran  prescription pending as of yesterday - refill was too soon upon original request.

## 2024-01-28 NOTE — Telephone Encounter (Signed)
 [facility] called and spoke to [place], [facility] about the refill(s) zofran  requested. Advised it was sent on 11/17/23 #90/2 refill(s). He says the patient has been picking up 60 at a time and he only has 60 left for a 20 day supply. He says a new Rx will need to be sent for 90 pills so the patient could get the full month supply. Advised I will send to the provider.

## 2024-01-28 NOTE — Progress Notes (Signed)
 Pts wife calls in and would like pallative care referral. 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

## 2024-01-28 NOTE — Telephone Encounter (Signed)
 Please advise, thanks.

## 2024-01-29 MED ORDER — ONDANSETRON HCL 8 MG PO TABS
ORAL_TABLET | ORAL | 2 refills | Status: DC
Start: 2024-01-29 — End: 2024-03-28

## 2024-01-29 NOTE — Telephone Encounter (Signed)
 Sent!

## 2024-02-01 ENCOUNTER — Ambulatory Visit (HOSPITAL_COMMUNITY)

## 2024-02-02 NOTE — CV Procedure (Signed)
  Device system confirmed to be MRI conditional, with implant date > 6 weeks ago, and no evidence of abandoned or epicardial leads in review of most recent CXR  Device last cleared by EP Provider: Mertha Abrahams 02/02/24  Clearance is good through for 1 year as long as parameters remain stable at time of check. If pt undergoes a cardiac device procedure during that time, they should be re-cleared.   Tachy-therapies to be programmed off if applicable with device back to pre-MRI settings after completion of exam.  Medtronic - Programming recommendation received through Medtronic App/Tablet  Arlys Lamer, RT  02/02/2024 1:48 PM

## 2024-02-08 ENCOUNTER — Ambulatory Visit (INDEPENDENT_AMBULATORY_CARE_PROVIDER_SITE_OTHER): Admitting: Sports Medicine

## 2024-02-08 DIAGNOSIS — S46012A Strain of muscle(s) and tendon(s) of the rotator cuff of left shoulder, initial encounter: Secondary | ICD-10-CM | POA: Diagnosis not present

## 2024-02-08 NOTE — Assessment & Plan Note (Signed)
 Jake [name] returns, he is a very pleasant 63 year old male, he had a fall last month with subsequent inability to abduct and externally rotate the left shoulder, he had excellent passive range of motion on exam but 0 strength to abduction and external rotation, internal rotation strength was acceptable. I did suspect a massive rotator cuff tear. Clay's complicated patient, he does have severe congestive heart failure, he does have an MRI safe ICD/pacer. He also has pulmonary fibrosis and is on baseline oxygen  nasal cannula. We added Voltaren  for pain, we held off on narcotics as he is a recovering user, he was unable to do his home physical therapy regimen due to fatigue from his heart failure and pulmonary disease. He does have his MRI scheduled. We had a long discussion today regarding appropriateness of the MRI and follow-up steps, he is not a good candidate for general anesthesia, he does not have 4 metabolic equivalents of cardiac capacity. I do not know what procedure would be indicated if he does have a massive rotator cuff tear, be at an arthroscopic repair versus reverse arthroplasty, but I wonder if he would be able to have this type of procedure under an interscalene block.

## 2024-02-08 NOTE — Progress Notes (Signed)
    Procedures performed today:    None.  Independent interpretation of notes and tests performed by another provider:   None.  Brief History, Exam, Impression, and Recommendations:    Rotator cuff tear, left Jake [name] returns, he is a very pleasant 63 year old male, he had a fall last month with subsequent inability to abduct and externally rotate the left shoulder, he had excellent passive range of motion on exam but 0 strength to abduction and external rotation, internal rotation strength was acceptable. I did suspect a massive rotator cuff tear. Jake [name]'s complicated patient, he does have severe congestive heart failure, he does have an MRI safe ICD/pacer. He also has pulmonary fibrosis and is on baseline oxygen  nasal cannula. We added Voltaren  for pain, we held off on narcotics as he is a recovering user, he was unable to do his home physical therapy regimen due to fatigue from his heart failure and pulmonary disease. He does have his MRI scheduled. We had a long discussion today regarding appropriateness of the MRI and follow-up steps, he is not a good candidate for general anesthesia, he does not have 4 metabolic equivalents of cardiac capacity. I do not know what procedure would be indicated if he does have a massive rotator cuff tear, be at an arthroscopic repair versus reverse arthroplasty, but I wonder if he would be able to have this type of procedure under an interscalene block.  I spent 30 minutes of total time managing this patient today, this includes chart review, face to face, and non-face to face time.  ____________________________________________ Joselyn Nicely. Sandy Crumb, M.D., ABFM., CAQSM., AME. Primary Care and Sports Medicine [facility]  Adjunct Professor of [facility] Medicine  [employer]  Restaurant manager, fast food

## 2024-02-09 ENCOUNTER — Ambulatory Visit (HOSPITAL_COMMUNITY)
Admission: RE | Admit: 2024-02-09 | Discharge: 2024-02-09 | Disposition: A | Discharge status: Home / Self Care | Source: Ambulatory Visit | Attending: Sports Medicine | Admitting: Sports Medicine

## 2024-02-11 ENCOUNTER — Other Ambulatory Visit: Payer: Self-pay | Admitting: Adult Health

## 2024-02-11 DIAGNOSIS — F411 Generalized anxiety disorder: Secondary | ICD-10-CM

## 2024-02-18 NOTE — Progress Notes (Signed)
 Remote ICD transmission.

## 2024-02-26 DIAGNOSIS — J849 Interstitial pulmonary disease, unspecified: Secondary | ICD-10-CM | POA: Diagnosis not present

## 2024-02-28 ENCOUNTER — Other Ambulatory Visit: Payer: Self-pay | Admitting: Physician Assistant

## 2024-02-28 ENCOUNTER — Other Ambulatory Visit: Payer: Self-pay | Admitting: Cardiology

## 2024-02-28 DIAGNOSIS — I2510 Atherosclerotic heart disease of native coronary artery without angina pectoris: Secondary | ICD-10-CM

## 2024-02-28 DIAGNOSIS — Z955 Presence of coronary angioplasty implant and graft: Secondary | ICD-10-CM

## 2024-02-28 DIAGNOSIS — I214 Non-ST elevation (NSTEMI) myocardial infarction: Secondary | ICD-10-CM

## 2024-02-28 DIAGNOSIS — E782 Mixed hyperlipidemia: Secondary | ICD-10-CM

## 2024-02-28 DIAGNOSIS — K580 Irritable bowel syndrome with diarrhea: Secondary | ICD-10-CM

## 2024-03-01 NOTE — CV Procedure (Signed)
  Device system confirmed to be MRI conditional, with implant date > 6 weeks ago, and no evidence of abandoned or epicardial leads in review of most recent CXR  Device last cleared by EP Provider: Suzann Riddle 03/01/24  Clearance is good through for 1 year as long as parameters remain stable at time of check. If pt undergoes a cardiac device procedure during that time, they should be re-cleared.   Tachy-therapies to be programmed off if applicable with device back to pre-MRI settings after completion of exam.  Medtronic - Programming recommendation received through Medtronic App/Tablet  Izetta CHRISTELLA Linen, RT  03/01/2024 10:19 AM

## 2024-03-08 ENCOUNTER — Ambulatory Visit: Payer: Self-pay

## 2024-03-08 DIAGNOSIS — Z95810 Presence of automatic (implantable) cardiac defibrillator: Secondary | ICD-10-CM | POA: Diagnosis not present

## 2024-03-08 DIAGNOSIS — J849 Interstitial pulmonary disease, unspecified: Secondary | ICD-10-CM | POA: Diagnosis not present

## 2024-03-08 DIAGNOSIS — I110 Hypertensive heart disease with heart failure: Secondary | ICD-10-CM | POA: Diagnosis not present

## 2024-03-08 DIAGNOSIS — I5022 Chronic systolic (congestive) heart failure: Secondary | ICD-10-CM | POA: Diagnosis not present

## 2024-03-08 DIAGNOSIS — Z9981 Dependence on supplemental oxygen: Secondary | ICD-10-CM | POA: Diagnosis not present

## 2024-03-08 NOTE — Telephone Encounter (Signed)
 Copied from CRM [id number]. Topic: Clinical - Medication Question >> Mar 08, 2024  5:44 PM Kevelyn M wrote: Reason for CRM: MRI scheduled tomorrow. Asking if he can treat without MRI (shoulder)? It's $300 for out pay. He's saying if it's not critical for him to have an MRI, then he won't get it.

## 2024-03-09 ENCOUNTER — Ambulatory Visit: Payer: Self-pay | Admitting: Sports Medicine

## 2024-03-09 ENCOUNTER — Other Ambulatory Visit: Payer: Self-pay | Admitting: Physician Assistant

## 2024-03-09 ENCOUNTER — Ambulatory Visit (HOSPITAL_COMMUNITY)
Admission: RE | Admit: 2024-03-09 | Discharge: 2024-03-09 | Disposition: A | Discharge status: Home / Self Care | Source: Ambulatory Visit | Attending: Sports Medicine | Admitting: Sports Medicine

## 2024-03-09 ENCOUNTER — Other Ambulatory Visit: Payer: Self-pay | Admitting: Adult Health

## 2024-03-09 DIAGNOSIS — J9611 Chronic respiratory failure with hypoxia: Secondary | ICD-10-CM

## 2024-03-09 DIAGNOSIS — M19012 Primary osteoarthritis, left shoulder: Secondary | ICD-10-CM | POA: Diagnosis not present

## 2024-03-09 DIAGNOSIS — M7582 Other shoulder lesions, left shoulder: Secondary | ICD-10-CM | POA: Diagnosis not present

## 2024-03-09 DIAGNOSIS — S46012A Strain of muscle(s) and tendon(s) of the rotator cuff of left shoulder, initial encounter: Secondary | ICD-10-CM | POA: Insufficient documentation

## 2024-03-09 DIAGNOSIS — I5022 Chronic systolic (congestive) heart failure: Secondary | ICD-10-CM

## 2024-03-09 DIAGNOSIS — J849 Interstitial pulmonary disease, unspecified: Secondary | ICD-10-CM

## 2024-03-09 DIAGNOSIS — M948X1 Other specified disorders of cartilage, shoulder: Secondary | ICD-10-CM | POA: Diagnosis not present

## 2024-03-09 DIAGNOSIS — F411 Generalized anxiety disorder: Secondary | ICD-10-CM

## 2024-03-09 DIAGNOSIS — K219 Gastro-esophageal reflux disease without esophagitis: Secondary | ICD-10-CM

## 2024-03-09 DIAGNOSIS — G4733 Obstructive sleep apnea (adult) (pediatric): Secondary | ICD-10-CM

## 2024-03-09 DIAGNOSIS — F39 Unspecified mood [affective] disorder: Secondary | ICD-10-CM

## 2024-03-09 NOTE — Telephone Encounter (Signed)
 Jake [name], he asks this the day before the MRI.  If there is a full-thickness massive rotator cuff tear, an MRI is necessary for surgical planning, however I can make the diagnosis with ultrasound.  Ultrasound unfortunately is not enough for a surgeon to go off of for procedural planning.  We should proceed with the MRI and then revisit this to go over options.

## 2024-03-13 NOTE — Telephone Encounter (Signed)
 Patient did have the MRI.

## 2024-03-18 DIAGNOSIS — N400 Enlarged prostate without lower urinary tract symptoms: Secondary | ICD-10-CM | POA: Diagnosis not present

## 2024-03-18 DIAGNOSIS — I25119 Atherosclerotic heart disease of native coronary artery with unspecified angina pectoris: Secondary | ICD-10-CM | POA: Diagnosis not present

## 2024-03-18 DIAGNOSIS — R079 Chest pain, unspecified: Secondary | ICD-10-CM | POA: Diagnosis not present

## 2024-03-18 DIAGNOSIS — I480 Paroxysmal atrial fibrillation: Secondary | ICD-10-CM | POA: Diagnosis not present

## 2024-03-18 DIAGNOSIS — J849 Interstitial pulmonary disease, unspecified: Secondary | ICD-10-CM | POA: Diagnosis not present

## 2024-03-18 DIAGNOSIS — K219 Gastro-esophageal reflux disease without esophagitis: Secondary | ICD-10-CM | POA: Diagnosis not present

## 2024-03-18 DIAGNOSIS — R918 Other nonspecific abnormal finding of lung field: Secondary | ICD-10-CM | POA: Diagnosis not present

## 2024-03-18 DIAGNOSIS — Z7982 Long term (current) use of aspirin: Secondary | ICD-10-CM | POA: Diagnosis not present

## 2024-03-18 DIAGNOSIS — Z87891 Personal history of nicotine dependence: Secondary | ICD-10-CM | POA: Diagnosis not present

## 2024-03-18 DIAGNOSIS — F410 Panic disorder [episodic paroxysmal anxiety] without agoraphobia: Secondary | ICD-10-CM | POA: Diagnosis not present

## 2024-03-18 DIAGNOSIS — R61 Generalized hyperhidrosis: Secondary | ICD-10-CM | POA: Diagnosis not present

## 2024-03-18 DIAGNOSIS — R0989 Other specified symptoms and signs involving the circulatory and respiratory systems: Secondary | ICD-10-CM | POA: Diagnosis not present

## 2024-03-18 DIAGNOSIS — J9602 Acute respiratory failure with hypercapnia: Secondary | ICD-10-CM | POA: Diagnosis not present

## 2024-03-18 DIAGNOSIS — Z955 Presence of coronary angioplasty implant and graft: Secondary | ICD-10-CM | POA: Diagnosis not present

## 2024-03-18 DIAGNOSIS — J9611 Chronic respiratory failure with hypoxia: Secondary | ICD-10-CM | POA: Diagnosis not present

## 2024-03-18 DIAGNOSIS — I454 Nonspecific intraventricular block: Secondary | ICD-10-CM | POA: Diagnosis not present

## 2024-03-18 DIAGNOSIS — R000 Tachycardia, unspecified: Secondary | ICD-10-CM | POA: Diagnosis not present

## 2024-03-18 DIAGNOSIS — Z79899 Other long term (current) drug therapy: Secondary | ICD-10-CM | POA: Diagnosis not present

## 2024-03-18 DIAGNOSIS — I428 Other cardiomyopathies: Secondary | ICD-10-CM | POA: Diagnosis not present

## 2024-03-18 DIAGNOSIS — R0789 Other chest pain: Secondary | ICD-10-CM | POA: Diagnosis not present

## 2024-03-18 DIAGNOSIS — G4730 Sleep apnea, unspecified: Secondary | ICD-10-CM | POA: Diagnosis not present

## 2024-03-18 DIAGNOSIS — R0602 Shortness of breath: Secondary | ICD-10-CM | POA: Diagnosis not present

## 2024-03-18 DIAGNOSIS — E8729 Other acidosis: Secondary | ICD-10-CM | POA: Diagnosis not present

## 2024-03-18 DIAGNOSIS — Z136 Encounter for screening for cardiovascular disorders: Secondary | ICD-10-CM | POA: Diagnosis not present

## 2024-03-18 DIAGNOSIS — E876 Hypokalemia: Secondary | ICD-10-CM | POA: Diagnosis not present

## 2024-03-18 DIAGNOSIS — E785 Hyperlipidemia, unspecified: Secondary | ICD-10-CM | POA: Diagnosis not present

## 2024-03-18 DIAGNOSIS — I10 Essential (primary) hypertension: Secondary | ICD-10-CM | POA: Diagnosis not present

## 2024-03-18 DIAGNOSIS — J9621 Acute and chronic respiratory failure with hypoxia: Secondary | ICD-10-CM | POA: Diagnosis not present

## 2024-03-18 DIAGNOSIS — I252 Old myocardial infarction: Secondary | ICD-10-CM | POA: Diagnosis not present

## 2024-03-18 DIAGNOSIS — F319 Bipolar disorder, unspecified: Secondary | ICD-10-CM | POA: Diagnosis not present

## 2024-03-18 DIAGNOSIS — I4891 Unspecified atrial fibrillation: Secondary | ICD-10-CM | POA: Diagnosis not present

## 2024-03-18 DIAGNOSIS — Z9981 Dependence on supplemental oxygen: Secondary | ICD-10-CM | POA: Diagnosis not present

## 2024-03-18 DIAGNOSIS — Z95810 Presence of automatic (implantable) cardiac defibrillator: Secondary | ICD-10-CM | POA: Diagnosis not present

## 2024-03-18 DIAGNOSIS — I5022 Chronic systolic (congestive) heart failure: Secondary | ICD-10-CM | POA: Diagnosis not present

## 2024-03-18 DIAGNOSIS — I110 Hypertensive heart disease with heart failure: Secondary | ICD-10-CM | POA: Diagnosis not present

## 2024-03-20 ENCOUNTER — Encounter: Payer: Self-pay | Admitting: Cardiology

## 2024-03-20 ENCOUNTER — Ambulatory Visit: Admitting: Adult Health

## 2024-03-20 MED ORDER — BUSPIRONE HCL 15 MG PO TABS: 15.0000 mg | ORAL_TABLET | Freq: Two times a day (BID) | ORAL | 0 refills | Status: DC

## 2024-03-20 MED ORDER — MODAFINIL 200 MG PO TABS: 200.0000 mg | ORAL_TABLET | Freq: Every day | ORAL | 0 refills | Status: DC

## 2024-03-20 MED ORDER — SERTRALINE HCL 100 MG PO TABS: 200.0000 mg | ORAL_TABLET | Freq: Every day | ORAL | 0 refills | Status: DC

## 2024-03-20 NOTE — Telephone Encounter (Signed)
 Pt's wife called in and needs refill on BH medication. His provider left office and he has been admitted to hospital and needs refills on medications.   Sent modafinil /buspar /zoloft .

## 2024-03-21 ENCOUNTER — Telehealth: Payer: Self-pay

## 2024-03-21 ENCOUNTER — Telehealth: Payer: Self-pay | Admitting: Adult Health

## 2024-03-21 NOTE — Telephone Encounter (Signed)
 Pt is past pt of Jake [name]. He was in the hospital and they took him off of sertraline . He is back on it now at 100 mg. He wants to know how fast he can go up to 200 mg. Please give him a call at [phone]

## 2024-03-21 NOTE — Telephone Encounter (Signed)
 I read patient this note from his hospital discharge summary.  I have decreased your Zoloft  to 100mg  daily at the direction of our psychiatrist. You may start the new dosing at 100mg  daily or continue the 200mg  daily. The decreased dose it to ensure your qt interval is not prolonged. Either way please follow up with your psychiatrist in the next 2 weeks.   He has appt with cardiologist on 7/29. I told him Tillman could not advise on dosing because she has not seen him yet.

## 2024-03-21 NOTE — Telephone Encounter (Signed)
 Copied from CRM [id number]. Topic: Appointments - Scheduling Inquiry for Clinic >> Mar 21, 2024 10:42 AM Shona RAMAN wrote: Reason for CRM: patient was recently in hospital and discharged yesterday, 07/14, for afib of heart and pulmonary failure. Patient has ILD, and needs an appointment within the next two weeks. No openings until October and that is too far.  Patient scheduled 8/26 and is aware. Nothing further needed

## 2024-03-21 NOTE — Transitions of Care (Post Inpatient/ED Visit) (Signed)
   03/21/2024  Name: Jake [name] MRN: [id number] DOB: Apr 18, 1961  Today's TOC FU Call Status: Today's TOC FU Call Status:: Successful TOC FU Call Completed (patient reports palliative nurse is calling him and he feels this would be overwhelming - confirmed he has hospital f/u scheduled and he will call back if he changes his mind) TOC FU Call Complete Date: 03/21/24 Patient's Name and Date of Birth confirmed.  Transition Care Management Follow-up Telephone Call Date of Discharge: 03/20/24 Discharge Facility: Other ([facility]) Name of Other ([facility]) Discharge Facility: [facility] Type of Discharge: Inpatient Admission Primary Inpatient Discharge Diagnosis:: Atrial fibrillation with rapid ventricular response  Shona Prow RN, CCM [place]  VBCI-Population Health RN Care Manager [phone]

## 2024-03-21 NOTE — Telephone Encounter (Signed)
 LVM to [facility].  Has not been seen by [facility]man yet, has canceled 3 appts.

## 2024-03-23 ENCOUNTER — Ambulatory Visit: Admitting: Sports Medicine

## 2024-03-27 DIAGNOSIS — J849 Interstitial pulmonary disease, unspecified: Secondary | ICD-10-CM | POA: Diagnosis not present

## 2024-03-28 ENCOUNTER — Encounter: Payer: Self-pay | Admitting: Physician Assistant

## 2024-03-28 ENCOUNTER — Ambulatory Visit (INDEPENDENT_AMBULATORY_CARE_PROVIDER_SITE_OTHER): Admitting: Physician Assistant

## 2024-03-28 VITALS — BP 137/84 | HR 84 | Ht 71.0 in | Wt 237.0 lb

## 2024-03-28 DIAGNOSIS — R110 Nausea: Secondary | ICD-10-CM

## 2024-03-28 DIAGNOSIS — G4733 Obstructive sleep apnea (adult) (pediatric): Secondary | ICD-10-CM | POA: Diagnosis not present

## 2024-03-28 DIAGNOSIS — J849 Interstitial pulmonary disease, unspecified: Secondary | ICD-10-CM

## 2024-03-28 DIAGNOSIS — I5022 Chronic systolic (congestive) heart failure: Secondary | ICD-10-CM | POA: Diagnosis not present

## 2024-03-28 DIAGNOSIS — F39 Unspecified mood [affective] disorder: Secondary | ICD-10-CM

## 2024-03-28 DIAGNOSIS — F411 Generalized anxiety disorder: Secondary | ICD-10-CM | POA: Diagnosis not present

## 2024-03-28 DIAGNOSIS — I480 Paroxysmal atrial fibrillation: Secondary | ICD-10-CM | POA: Diagnosis not present

## 2024-03-28 MED ORDER — PROCHLORPERAZINE MALEATE 5 MG PO TABS
5.0000 mg | ORAL_TABLET | Freq: Three times a day (TID) | ORAL | 1 refills | Status: DC | PRN
Start: 2024-04-17 — End: 2024-05-18

## 2024-03-28 NOTE — Progress Notes (Addendum)
 Established Patient Office Visit  Subjective   Patient ID: Jake [name], male    DOB: [birth year]  Age: 63 y.o. MRN: [id number]  Chief Complaint  Patient presents with   Hospitalization Follow-up    HPI Pt is a 63 yo male who presents to the clinic for hospital follow up and medication review. He is accompanied by his wife.   Pt has a PMHx of CAD, CHF, HLD, HTN, AICD, ILD, OSA, GERD, GAD.   Pt went to the hospital due to chest pains and palpitations on 03/18/2024. He was found to be in A.fib. he was started on IV diltiazem and then amiodarone. He was sent home on: amiodarone 200mg  every day, increased toprol  XL to 50mg  every day,  Started on eliquis 5mg  bid, d/c toresemide and started lasix  40mg  daily, continue entrestro.   Cardiologist is Dr. Pietro.   He has not converted to any more abnormal rhythms. He continues to struggle every day to breathe and on continuous O2. In the hospital he was put on bipap and helped get him off some of the O2. He wonders if this could help him out of the hospital.   ROS See HPI.    Objective:     BP 137/84   Pulse 84   Ht 5' 11 (1.803 m)   Wt 237 lb (107.5 kg)   SpO2 95% Comment: 5-6L  BMI 33.05 kg/m  BP Readings from Last 3 Encounters:  03/28/24 137/84  01/10/24 122/87  12/27/23 (!) 160/80   Wt Readings from Last 3 Encounters:  03/28/24 237 lb (107.5 kg)  11/10/23 236 lb (107 kg)  11/03/23 232 lb (105.2 kg)      Physical Exam Constitutional:      Appearance: Normal appearance.  HENT:     Head: Normocephalic.  Cardiovascular:     Rate and Rhythm: Normal rate and regular rhythm.  Pulmonary:     Comments: Labored breathing on 5-6L of O2.  Musculoskeletal:     Right lower leg: No edema.     Left lower leg: No edema.  Neurological:     General: No focal deficit present.     Mental Status: He is alert and oriented to person, place, and time.  Psychiatric:        Mood and Affect: Mood normal.        Assessment &  Plan:  SABRASABRACalyn was seen today for hospitalization follow-up.  Diagnoses and all orders for this visit:  Paroxysmal atrial fibrillation (HCC)  Generalized anxiety disorder  Episodic mood disorder (HCC)  OSA on CPAP  ILD (interstitial lung disease) (HCC)  Chronic nausea -     prochlorperazine  (COMPAZINE ) 5 MG tablet; Take 1 tablet (5 mg total) by mouth every 8 (eight) hours as needed for nausea or vomiting.  Chronic systolic congestive heart failure, NYHA class 3 [facility])   Reviewed Hospital Chart and made medication changes:   Since on eliquis stop plavix /ASA/ibuprofen  Updated medication list to reflect changes Patient has follow up with cardiology in October Patient has follow up with pulmonology in August(consider asking about Bipap at home) Vitals look good today and normal HR Continue on O2 5-6L continuous  Pt has left shoulder pain and scheduled to see Dr. ONEIDA for injection.  Compazine  works better for nausea and does not interact with medications, refilled today.  New spirometer given today to help with pulmonary rehab  Discussed end-stages of life. Patient has palliative care and they come out every 2 weeks. He  is close to wanting to turn off ICD device and become more comfortable with anxiety medication and pain medication. He reports I am tired of struggling to breathe.   Spent 40 minutes with patient and patient wife in chart review, discussing plan, reviewing medications and coordinating care.    Tanzania Basham, PA-C

## 2024-03-31 ENCOUNTER — Other Ambulatory Visit: Payer: Self-pay

## 2024-03-31 ENCOUNTER — Ambulatory Visit: Admitting: Sports Medicine

## 2024-03-31 DIAGNOSIS — S46012A Strain of muscle(s) and tendon(s) of the rotator cuff of left shoulder, initial encounter: Secondary | ICD-10-CM

## 2024-03-31 DIAGNOSIS — M7542 Impingement syndrome of left shoulder: Secondary | ICD-10-CM

## 2024-03-31 MED ORDER — TRIAMCINOLONE ACETONIDE 40 MG/ML IJ SUSP
40.0000 mg | Freq: Once | INTRAMUSCULAR | Status: AC
  Administered 2024-03-31: 40 mg via INTRA_ARTICULAR

## 2024-03-31 NOTE — Progress Notes (Signed)
    Procedures performed today:    Procedure: Real-time Ultrasound Guided injection of the left subacromial bursa Device: Samsung HS60  Verbal informed consent obtained.  Time-out conducted.  Noted no overlying erythema, induration, or other signs of local infection.  Skin prepped in a sterile fashion.  Local anesthesia: Topical Ethyl chloride.  With sterile technique and under real time ultrasound guidance: Intact cuff noted, 1 cc Kenalog 40, 2 cc lidocaine , 2 cc bupivacaine  injected easily Completed without difficulty  Advised to call if fevers/chills, erythema, induration, drainage, or persistent bleeding.  Images permanently stored and available for review in PACS.  Impression: Technically successful ultrasound guided injection.  Independent interpretation of notes and tests performed by another provider:   None.  Brief History, Exam, Impression, and Recommendations:    Impingement syndrome, shoulder, left Very pleasant 63 year old male, he had a fall a couple of months ago with subsequent inability to abduct and externally rotate the left shoulder, he had good passive motion but 0 strength to abduction and external rotation, internal rotation strength was acceptable. We suspect a large rotator cuff tear, he is complicated, he does have severe congestive heart failure, he has pulmonary fibrosis and is on baseline oxygen  nasal cannula. 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 MRI did show cuff tendinosis, glenohumeral osteoarthritis, acromioclavicular osteoarthritis but no rotator cuff tearing. Today we did a subacromial injection. I am concerned that his weakness to abduction and external rotation is related to a suprascapular nerve injury. He will do some home PT but if not making progress we will add formal PT. If none of this helps we will consider nerve conduction and EMG of the rotator  cuff.    ____________________________________________ Debby PARAS. Curtis, M.D., ABFM., CAQSM., AME. Primary Care and Sports Medicine [facility]  Adjunct Professor of [facility] Medicine  [employer]  Restaurant manager, fast food

## 2024-03-31 NOTE — Assessment & Plan Note (Signed)
 Very pleasant 63 year old male, he had a fall a couple of months ago with subsequent inability to abduct and externally rotate the left shoulder, he had good passive motion but 0 strength to abduction and external rotation, internal rotation strength was acceptable. We suspect a large rotator cuff tear, he is complicated, he does have severe congestive heart failure, he has pulmonary fibrosis and is on baseline oxygen  nasal cannula. 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 MRI did show cuff tendinosis, glenohumeral osteoarthritis, acromioclavicular osteoarthritis but no rotator cuff tearing. Today we did a subacromial injection. I am concerned that his weakness to abduction and external rotation is related to a suprascapular nerve injury. He will do some home PT but if not making progress we will add formal PT. If none of this helps we will consider nerve conduction and EMG of the rotator cuff.

## 2024-03-31 NOTE — Addendum Note (Signed)
 Addended by: OLIVA-AVELLANEDA, Amelya Mabry L on: 03/31/2024 03:38 PM   Modules accepted: Orders

## 2024-04-04 ENCOUNTER — Ambulatory Visit: Admitting: Cardiology

## 2024-04-05 ENCOUNTER — Other Ambulatory Visit: Payer: Self-pay | Admitting: Physician Assistant

## 2024-04-05 DIAGNOSIS — N401 Enlarged prostate with lower urinary tract symptoms: Secondary | ICD-10-CM

## 2024-04-10 ENCOUNTER — Other Ambulatory Visit: Payer: Self-pay | Admitting: Physician Assistant

## 2024-04-10 DIAGNOSIS — Z20822 Contact with and (suspected) exposure to covid-19: Secondary | ICD-10-CM

## 2024-04-10 MED ORDER — MOLNUPIRAVIR EUA 200MG CAPSULE: 4.0000 | ORAL_CAPSULE | Freq: Two times a day (BID) | ORAL | 0 refills | Status: DC

## 2024-04-10 NOTE — Progress Notes (Signed)
 Wife with covid infection. Pt given to take if test positive at home due to chronic respiratory failure.

## 2024-04-11 NOTE — Telephone Encounter (Signed)
 He cannot take paxlovid  due to being on eliquis. There is no other alternative.

## 2024-04-14 ENCOUNTER — Ambulatory Visit: Payer: Medicare HMO

## 2024-04-14 DIAGNOSIS — R55 Syncope and collapse: Secondary | ICD-10-CM | POA: Diagnosis not present

## 2024-04-14 LAB — CUP PACEART REMOTE DEVICE CHECK
Battery Remaining Longevity: 111 mo
Battery Voltage: 3.01 V
Brady Statistic RV Percent Paced: 0.81 %
Date Time Interrogation Session: 20250807230015
HighPow Impedance: 63 Ohm
Lead Channel Impedance Value: 304 Ohm
Lead Channel Impedance Value: 361 Ohm
Lead Channel Impedance Value: 399 Ohm
Lead Channel Impedance Value: 418 Ohm
Lead Channel Impedance Value: 437 Ohm
Lead Channel Impedance Value: 551 Ohm
Lead Channel Impedance Value: 589 Ohm
Lead Channel Impedance Value: 741 Ohm
Lead Channel Impedance Value: 798 Ohm
Lead Channel Impedance Value: 893 Ohm
Lead Channel Impedance Value: 912 Ohm
Lead Channel Impedance Value: 931 Ohm
Lead Channel Impedance Value: 988 Ohm
Lead Channel Pacing Threshold Amplitude: 0.5 V
Lead Channel Pacing Threshold Amplitude: 0.5 V
Lead Channel Pacing Threshold Amplitude: 1.375 V
Lead Channel Pacing Threshold Pulse Width: 0.4 ms
Lead Channel Pacing Threshold Pulse Width: 0.4 ms
Lead Channel Pacing Threshold Pulse Width: 0.8 ms
Lead Channel Sensing Intrinsic Amplitude: 15.1 mV
Lead Channel Sensing Intrinsic Amplitude: 3.3 mV
Lead Channel Setting Pacing Amplitude: 1 V
Lead Channel Setting Pacing Amplitude: 1.5 V
Lead Channel Setting Pacing Amplitude: 2 V
Lead Channel Setting Pacing Pulse Width: 0.4 ms
Lead Channel Setting Pacing Pulse Width: 0.8 ms
Lead Channel Setting Sensing Sensitivity: 0.3 mV
Zone Setting Status: 755011
Zone Setting Status: 755011
Zone Setting Status: 755011

## 2024-04-17 ENCOUNTER — Other Ambulatory Visit: Payer: Self-pay | Admitting: Physician Assistant

## 2024-04-17 ENCOUNTER — Ambulatory Visit: Payer: Self-pay | Admitting: Cardiology

## 2024-04-17 DIAGNOSIS — I5022 Chronic systolic (congestive) heart failure: Secondary | ICD-10-CM

## 2024-04-17 DIAGNOSIS — I480 Paroxysmal atrial fibrillation: Secondary | ICD-10-CM

## 2024-04-17 MED ORDER — PACERONE 200 MG PO TABS: 200.0000 mg | ORAL_TABLET | Freq: Every day | ORAL | 0 refills | Status: DC

## 2024-04-17 MED ORDER — LASIX 20 MG PO TABS: 20.0000 mg | ORAL_TABLET | Freq: Two times a day (BID) | ORAL | 1 refills | Status: DC

## 2024-04-17 NOTE — Telephone Encounter (Signed)
 Copied from CRM #[id number]. Topic: Clinical - Medication Refill >> Apr 17, 2024 10:23 AM Carmell R wrote: Medication: Medications were prescribed while in the hospital and needs to be refilled by pcp furosemide  20mg  2 tabs by mouth every morning Amiodarone  200mg  1 tab a day for maintenance  Has the patient contacted their pharmacy? Yes, needs new rx  This is the patient's preferred pharmacy:  [id number] [place], [place] - [address] [place] [address] Phone: [phone] Fax: [phone]  Is this the correct pharmacy for this prescription? Yes If no, delete pharmacy and type the correct one.   Has the prescription been filled recently? Yes, while in the hospital  Is the patient out of the medication? No, has 1 day left  Has the patient been seen for an appointment in the last year OR does the patient have an upcoming appointment? Yes  Can we respond through MyChart? No  Agent: Please be advised that Rx refills may take up to 3 business days. We ask that you follow-up with your pharmacy.

## 2024-04-17 NOTE — Telephone Encounter (Signed)
 Copied from CRM #[id number]. Topic: Clinical - Medication Refill >> Apr 17, 2024 10:23 AM Carmell R wrote: Medication: Medications were prescribed while in the hospital and needs to be refilled by pcp furosemide  20mg  2 tabs by mouth every morning Amiodarone  200mg  1 tab a day for maintenance   Has the patient contacted their pharmacy? Yes, needs new rx   This is the patient's preferred pharmacy:  [id number] [place], [place] - [address] [place] [address] Phone: [phone] Fax: [phone]

## 2024-04-19 ENCOUNTER — Other Ambulatory Visit: Payer: Self-pay | Admitting: Physician Assistant

## 2024-04-19 DIAGNOSIS — I480 Paroxysmal atrial fibrillation: Secondary | ICD-10-CM

## 2024-04-19 MED ORDER — ELIQUIS 5 MG PO TABS: 5.0000 mg | ORAL_TABLET | Freq: Two times a day (BID) | ORAL | 5 refills | Status: DC

## 2024-04-19 NOTE — Progress Notes (Signed)
 Pt needs refills of eliquis .

## 2024-04-24 ENCOUNTER — Other Ambulatory Visit: Payer: Self-pay | Admitting: Physician Assistant

## 2024-04-24 DIAGNOSIS — J9611 Chronic respiratory failure with hypoxia: Secondary | ICD-10-CM | POA: Diagnosis not present

## 2024-04-24 DIAGNOSIS — R0602 Shortness of breath: Secondary | ICD-10-CM | POA: Diagnosis not present

## 2024-04-24 NOTE — Progress Notes (Signed)
 Comes in wanting labs to check CO2. His breathing is getting more labored. He is on continuous O2.

## 2024-04-25 ENCOUNTER — Ambulatory Visit: Payer: Self-pay | Admitting: Physician Assistant

## 2024-04-25 LAB — CMP14+EGFR
ALT: 36 IU/L (ref 0–44)
AST: 27 IU/L (ref 0–40)
Albumin: 4.7 g/dL (ref 3.9–4.9)
Alkaline Phosphatase: 59 IU/L (ref 44–121)
BUN/Creatinine Ratio: 18 (ref 10–24)
BUN: 22 mg/dL (ref 8–27)
Bilirubin Total: <0.2 mg/dL (ref 0.0–1.2)
CO2: 26 mmol/L (ref 20–29)
Calcium: 9.5 mg/dL (ref 8.6–10.2)
Chloride: 96 mmol/L (ref 96–106)
Creatinine, Ser: 1.23 mg/dL (ref 0.76–1.27)
Globulin, Total: 2.5 g/dL (ref 1.5–4.5)
Glucose: 85 mg/dL (ref 70–99)
Potassium: 4.5 mmol/L (ref 3.5–5.2)
Sodium: 139 mmol/L (ref 134–144)
Total Protein: 7.2 g/dL (ref 6.0–8.5)
eGFR: 66 mL/min/1.73 (ref >59)

## 2024-04-25 LAB — CBC WITH DIFFERENTIAL/PLATELET
Basophils Absolute: 0 x10E3/uL (ref 0.0–0.2)
Basos: 1 %
EOS (ABSOLUTE): 0.2 x10E3/uL (ref 0.0–0.4)
Eos: 2 %
Hematocrit: 38.9 % (ref 37.5–51.0)
Hemoglobin: 12.3 g/dL — ABNORMAL LOW (ref 13.0–17.7)
Immature Grans (Abs): 0 x10E3/uL (ref 0.0–0.1)
Immature Granulocytes: 0 %
Lymphocytes Absolute: 1.2 x10E3/uL (ref 0.7–3.1)
Lymphs: 17 %
MCH: 27.5 pg (ref 26.6–33.0)
MCHC: 31.6 g/dL (ref 31.5–35.7)
MCV: 87 fL (ref 79–97)
Monocytes Absolute: 0.9 x10E3/uL (ref 0.1–0.9)
Monocytes: 12 %
Neutrophils Absolute: 5 x10E3/uL (ref 1.4–7.0)
Neutrophils: 68 %
Platelets: 320 x10E3/uL (ref 150–450)
RBC: 4.47 x10E6/uL (ref 4.14–5.80)
RDW: 13.5 % (ref 11.6–15.4)
WBC: 7.2 x10E3/uL (ref 3.4–10.8)

## 2024-04-25 NOTE — Progress Notes (Signed)
 CO2 levels were normal range.  This is not a venous blood glass but does give us  a good idea and looks better than usual.  Normal WBC. No signs of infection.

## 2024-04-27 DIAGNOSIS — J849 Interstitial pulmonary disease, unspecified: Secondary | ICD-10-CM | POA: Diagnosis not present

## 2024-04-28 ENCOUNTER — Ambulatory Visit: Admitting: Adult Health

## 2024-05-02 ENCOUNTER — Encounter

## 2024-05-02 ENCOUNTER — Ambulatory Visit: Admitting: Internal Medicine

## 2024-05-02 VITALS — BP 140/84 | HR 74 | Ht 71.0 in | Wt 234.0 lb

## 2024-05-02 DIAGNOSIS — J849 Interstitial pulmonary disease, unspecified: Secondary | ICD-10-CM | POA: Diagnosis not present

## 2024-05-02 DIAGNOSIS — Z09 Encounter for follow-up examination after completed treatment for conditions other than malignant neoplasm: Secondary | ICD-10-CM

## 2024-05-02 DIAGNOSIS — J9611 Chronic respiratory failure with hypoxia: Secondary | ICD-10-CM

## 2024-05-02 DIAGNOSIS — I5022 Chronic systolic (congestive) heart failure: Secondary | ICD-10-CM

## 2024-05-02 DIAGNOSIS — G4733 Obstructive sleep apnea (adult) (pediatric): Secondary | ICD-10-CM | POA: Diagnosis not present

## 2024-05-02 NOTE — Progress Notes (Signed)
 OV 02/03/2022 -transfer of care to Dr. Geronimo at the ILD clinic by Dr. MALVA  Subjective:  Patient ID: Jake [name], male , DOB: 1960-09-28 , age 63 y.o. , MRN: [id number] , ADDRESS: [address] [place] [address] PCP Antoniette Vermell CROME, PA-C Patient Care Team: Antoniette Vermell CROME DEVONNA as PCP - General (Family Medicine) Tobb, Kardie, DO as PCP - Cardiology (Cardiology) Savannah Hastings, PA-C as Referring Physician (Pulmonary Disease) Beverli Cara PARAS, [facility] as Pharmacist (Pharmacist)  This Provider for this visit: Treatment Team:  Attending Provider: Geronimo Amel, MD    02/03/2022 -   Chief Complaint  Patient presents with   Follow-up    Pt switching from Dr. Neda to MR for ILD eval. Pt states lately he has had problems of SOB that can happen at any time and also has a chronic cough.     HPI Jake [name] 63 y.o. -history is provided by the wife, patient and also reviewed the medical records and the ILD questionnaire.  He has had insidious onset of shortness of breath for the last few years.  Chart review it appears that in 2020 when he got his respiratory infection [in retrospect he thinks this was COVID before the formal declaration of the pandemic] and since then he has been having progressive shortness of breath.  Especially over the last year or so his progression is worse..  For the last 1 year has been on oxygen  2 L.  He feels he haseven progressed more in the last few months.  And is now needing oxygen  4 L at rest at all the time.  Sometimes it is 3 L.  Wife says he easily desaturates into the 18s and occasionally into the 7s when he exerts.  When he does the bed or showers or does chores increases to 4 L.  He feels a portable oxygen  is not helping him anymore.  Particularly more progressive in the last few months.  Recently was tried on prednisone .  He is not sure if it helped but it made him very angry had irate emotional outburst and he could not take  prednisone  anymore.  Through all this late last year in 2022 he was off his Entresto  for heart failure.  This made the shortness of breath worse but then he went back on it in January and he got better but nevertheless overall progressive dyspnea.     [facility] Comprehensive ILD Questionnaire  Symptoms:     Past Medical History :  Status post CABG in 2004.  He also had cardiac stents approximately 6-12 months ago according to his history with 1 repair of the stent.  Still it did not help the shortness of breath.  He has had COVID at least 3 times.  The first 1 was in January 2020 right before the onset of the pandemic.  The second 1 was in January 2020 and the third was in December 2022 during small bowel obstruction admission.  This was an incidental finding according to the wife  He has chronic systolic heart failure  Other issues include nausea, irritable bowel syndrome, acid reflux back and neck pain  He has obstructive sleep apnea  He has a history of pneumonia several years ago  ROS:  -Positive for fatigue for the last several months, arthralgia for the last several years, dysphagia for the last several decades, dry eyes for the last several months, nausea for the last several years, acid reflux  for the last several years also snoring for the last several years  FAMILY HISTORY of LUNG DISEASE:  Negative for any lung disease  PERSONAL EXPOSURE HISTORY:  -Did smoke in the remote past and quit -Smoked between 1985 and 2008 1 and half packs per day. -Possible cocaine use between 1991 and marijuana use between 1978 and 2008 : Very little to none   HOME  EXPOSURE and HOBBY DETAILS :  -20 years ago he lived in a house that had black mold he lived there for few years but has not been exposed to that for 20 years.  -Was using a Phillips responding CPAP machine for a few years.  It was subject to recall because of foam silicone  contamination issues that caused  respiratory complications.  He stopped using this in 2021 shortly after the recall was issued.  -He is now living in his daughter's house for the last 3 months.  -The old house did have mold.  It also had mildew in the shower curtain. 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  Restrictive pattern with mild desaturations.  -Echocardiogram March 2022 ` -Ejection fraction 30-35%, grade 1 diastolic dysfunction  - BNP May 2023  - 31  - RHC June 2023  -  [place] [place] [facility]   Echo 02/12/22: EF 40-45%, mild MR. LA moderately dilated. RV size and function normal. GRade 1 DD. RHC 02/12/22: RA- 5 mmHg; PA 25/12 mmHg, mean PAP 17 mmHg, Wedge 8 mmHg, CO (fick) 6.59 L/mim, CI-2.96 L/min meter2.   03/12/2022: Today - follow up Patient presents today with wife after being started on Ofev. He is having significant nausea with his first dose of Ofev. He wakes up in the morning, takes his zofran  and eats a small snack. He then takes his Ofev at 8 am with food as well, and has tried multiple different food options. He then becomes extremely nauseated around 845 and has to go lay down until he is able to take his second dose of Zofran  around 1/2 pm. He finally begins to feel better and is able to tolerate his second dose. He is understandably  frustrated by this and does not wish to have this poor quality of life from his medications. He denies any vomiting or diarrhea. He has had some weight gain as he has had to eat so often with the Ofev to try to curve his nausea.   From a respiratory standpoint, he continues to get winded with minimal exertion. He can be changing the bed at home or walk 50 ft to the mailbox and has to sit down afterwards to rest and catch his breath. He has also had drops in his oxygen  into the 70's-80's despite his supplemental 4 lpm so he has been minimally active because of this. No significant cough or wheezing. He has not noticed any difference  in his activity tolerance or lung function since starting the Ofev, but has felt so terrible that he's not sure if he would notice much.           OV 05/05/2022  Subjective:  Patient ID: Jake [name], male , DOB: Jan 15, 1961 , age 39 y.o. , MRN: [id number] , ADDRESS: [address] [place] [address] PCP Antoniette Vermell CROME, PA-C Patient Care Team: Antoniette Vermell CROME DEVONNA as PCP - General (Family Medicine) Tobb, Kardie, DO as PCP - Cardiology (Cardiology) Savannah Hastings, PA-C as Referring Physician (Pulmonary Disease) Beverli Cara PARAS, [facility] as Pharmacist (Pharmacist)  This Provider for this visit: Treatment Team:  Attending Provider: Geronimo Amel, MD    05/05/2022 -   Chief Complaint  Patient presents with   Follow-up    Pt states he feels like his breathing has become worse since last visit. Also states he has had a dry cough, chest discomfort, and has been having GI issues due to the Esbriet .     HPI Boleslaw Borghi 63 y.o. --presents for follow-up.  Presents with his wife.  He has non-- IPF progressive phenotype ILD.  Suspected etiology usage of CPAP machine [employer].  He tried nintedanib but he had significant intolerance.  He is currently on fourth week of pirfenidone  at full dose for the last 1 week.  He is having significant  diarrhea nausea and fatigue because of this.  At 2 pills 3 times daily it was more tolerable.  He is willing to go back to 2 pills 3 times daily and try.  He also significant anxiety and depression.  He is on Zoloft .  In the past he has tried SNRI or some other agent according to the wife but he had side effects.  I have recommended they talk with the primary care physician about this.  He is also trying to lose weight.  He has been prescribed Wegovy  but there is no stock available.  He has been looked at [facility] for lung transplant but given his low EF he has been disqualified and they do not want to see him.  He did have a right heart catheterization in June 2023 and this was normal.  According to him after some delay from our office he has been referred to Marcey Lot at [facility] for lung transplant evaluation.  But he has not heard back from them.  I personally emailed Dr. Fritzi office.  Also he tells me that with 4 L nasal cannula at rest he is fine but when he does any laundry he desaturates into the 80s.  He has tried increasing to 5 L nasal cannula.  I advised that he could increase it further and keep his pulse ox over 86% and monitor it.  He is willing to do that.  They are not interested in immunomodulators right now because of young kids in the house.    Overall he and his wife are worried that he is declining.  He did have pulmonary function test today and?  Slight decline compared to May 2023.SABRA  He had liver function test monitoring blood work yesterday but so far the results are not back yet.   SUMMARY VISIT Dr Lot 06/09/22 at [facility]   He also agrees that patient does not have IPF.  CT scan is not consistent with UIP.  Currently pirfenidone  is causing too many side effects.  He wants to stop it.  Because there is more suggestion of valvulitis he  wants to start CellCept.  He is going to start the patient on 500 mg twice daily today.  Patient seeing me in approximately a  month from now.  At the time I will increase to a total of 1 g twice daily.   He also wants the patient referred to pulmonary rehabilitation.   In terms of lung transplantation a lot of relative contraindications including weight and mild cardiomyopathy and intolerance to steroids.  At this point in time he wants patient to be more active and get into rehab  Of note patient only desaturated to 88% on room air and that walk test today   Plan  - Get patient in for CBC chemistry and liver function test in 2-3 weeks from today  -Refer to pulmonary rehabilitation   OV 07/20/2022  Subjective:  Patient ID: Jake [name], male , DOB: Nov 28, 1960 , age 37 y.o. , MRN: [id number] , ADDRESS: [address] [place] [address] PCP Antoniette Vermell CROME, PA-C Patient Care Team: Antoniette Vermell CROME DEVONNA as PCP - General (Family Medicine) Tobb, Kardie, DO as PCP - Cardiology (Cardiology) Savannah Hastings, PA-C as Referring Physician (Pulmonary Disease) Beverli Cara PARAS, [facility] as Pharmacist (Pharmacist)  This Provider for this visit: Treatment Team:  Attending Provider: Geronimo Amel, MD    07/20/2022 -   Chief Complaint  Patient presents with   Follow-up    Pt states he has been doing okay since last visit. Has not worn his O2 in about a month and states that he has been walking about 0.11mile a day.     HPI Jaxon Flatt 63 y.o. -returns for follow-up with his wife.  Since his last visit he has lost over 10 pounds because of Ozempic.  He started his Ozempic.  Then approximately a month ago he did see Dr. Garnette Lot at [facility] .  At the time his exercise hypoxemia improved.  He started beginning to feel better as well.  According to the wife and him a day prior to that he was still feeling miserable but on the day of the visit he started noticing a turnaround and since then he has had sustained turnaround.  He was having side effects from pirfenidone  and this was stopped  early October 2023.  Dr. Lot recommended mycophenolate mofetil [patient mistakenly thought this was metoprolol  at this visit but later confirmed that he has been on metoprolol  for a long time to Dr. Pietro for his heart failure].  Since then he is having sustained improvement in dyspnea.  Also he was able to go to [employer] and walk on flat ground for a long time without desaturating.  He still desaturates but it is only for heavy exertion such as vacuuming or lifting heavy load.  He will go into the low 80s and then rest to recover.  He does not use his oxygen  for his hypoxemia.  Instead he opted to rest.  At this point in time he is telling me because of his improvement he does not want to do his mycophenolate havery is going to confirm it was indeed mycophenolate and not metoprolol ].  I referred him to pulm rehabilitation but this was expensive.  He is opting to exercise by himself and continue weight loss with Ozempic.  Also because of his improvement he is asking if he can do a pulmonary function test.  His walking desaturation test is improved.  He also his pulmonary function test shows improvement although he still worse compared to  June of last year.  His last echocardiogram was in the summer 2023 and he still had low ejection fraction.  His BNP at that time was normal.  In terms of lung transplant Dr. Rankin is told him it is too high risk given his medication side effects and heart failure and obesity.  At this point in time he is reconciled to the fact that he will not be a transplant candidate.  However he is overall pleased that he is improved.         OV 10/29/2022  Subjective:  Patient ID: Jake [name], male , DOB: Jan 12, 1961 , age 4 y.o. , MRN: [id number] , ADDRESS: [address] [place] [address] PCP Antoniette Vermell CROME, PA-C Patient Care Team: Antoniette Vermell CROME DEVONNA as PCP - General (Family Medicine) Tobb, Kardie, DO as PCP - Cardiology  (Cardiology) Savannah Hastings, PA-C as Referring Physician (Pulmonary Disease) Beverli Cara PARAS, [facility] as Pharmacist (Pharmacist)  This Provider for this visit: Treatment Team:  Attending Provider: Geronimo Amel, MD    10/29/2022 -   Chief Complaint  Patient presents with   Follow-up    Spiro review,     HPI Jake [name] 63 y.o. - Returns for follow-up with his wife.  Overall he reports being stable.  Weight is stable.  He is no longer on antifibrotic's because of intolerance.  His ejection fraction has dropped to 30% and he is frustrated by that.  Wife states that he does desaturate when he exerts and then he rests and the oxygen  goes up.  She would prefer for him to wear the oxygen  preemptively.  I encouraged him to do so because that is a better strategy.  At this point in time he states that he had a rough flu season he had stomach flu twice.  He believes he might have once in 2 or 3 other respiratory infections again denies any mold or mildew or bird feathers in the house he does do housework but has to wear oxygen  for that he can go shopping without oxygen  but wife suspects he might be desaturating.  We did discuss clinical trials as a care option.  Presented to have inhaled pirfenidone  trial that is yet to start and also inhaled treprostinil trial that is ongoing.  They are interested in this.  Discussed the following care concept about clinical trials.  Will reassess for this and second quarter or third quarter 2024.  He did have a CT scan of the chest Jan 2024 I reviewed the result.  ILD is described but there is no other abnormality described.  Since CT scan with contrast so we cannot compare the CT scans.     OV 01/05/2023  Subjective:  Patient ID: Jake [name], male , DOB: 07-08-1961 , age 57 y.o. , MRN: [id number] , ADDRESS: [address] [place] [address] PCP Antoniette Vermell CROME, PA-C Patient Care Team: Antoniette Vermell CROME DEVONNA as PCP - General (Family  Medicine) Pietro Redell RAMAN, MD as PCP - Cardiology (Cardiology) Savannah Hastings, PA-C as Referring Physician (Pulmonary Disease) Beverli Cara PARAS, [facility] as Pharmacist (Pharmacist)  This Provider for this visit: Treatment Team:  Attending Provider: Geronimo Amel, MD    01/05/2023 -   Chief Complaint  Patient presents with   Follow-up    F/up after heart attack    HPI Mannie Ohlin 63 y.o. - presents with wife. On approx 11/16/22 felt unweasy with chest burn. Admitted at [facility] and dx with MI with  trop leak. Had DES. Dsichargd on Brilintia and metoprolol  but both gav him side efffects of dyspnea, anxiety, arthralgia. So both stopped and now on plavix  and repatha . Toleraitng it ok but for mild fatigues. He came in today to updated me. Resp wise he feels stable. Can hold pulse ox > 88% at rest but with exertun desaturates. Wife is heer with him and is indeoendenbt historian - says he is not promot about wearing his o2. Currenty dealing with allergies. Used wifes; Azihro Wondered if I could fill his azithro but his baseline 2024 and 2023 EKG bith show QTc > 500 msec       OV 02/16/2023  Subjective:  Patient ID: Jake [name], male , DOB: 1960/09/23 , age 43 y.o. , MRN: [id number] , ADDRESS: [address] [place] [address] PCP Antoniette Vermell CROME, PA-C Patient Care Team: Antoniette Vermell CROME DEVONNA as PCP - General (Family Medicine) Pietro Redell RAMAN, MD as PCP - Cardiology (Cardiology) Savannah Hastings, PA-C as Referring Physician (Pulmonary Disease) Beverli Cara PARAS, [facility] as Pharmacist (Pharmacist)  This Provider for this visit: Treatment Team:  Attending Provider: Geronimo Amel, MD  02/16/2023 -   Chief Complaint  Patient presents with   Acute Visit    Cough, chest tightness, increasing sob when not on oxygen .     HPI Jake [name] 63 y.o. - ACUTE VISIT3L.  Presents with his wife Erwin Nishiyama.  She is an independent historian.  She corroborates everything that he is saying.   Around February 10, 2023 Wednesday started having slightly more cough.  Then on February 12, 2023 Friday he went to an outdoor concert in [place].  They started coughing more.  Then after he came back he started feeling and this is since February 13, 2023 tired with chest tightness coughing.  Also for the last 1 week his oxygen  is dropping.  Previously could take a shower without desaturating now he drops into the 70s.  He says his pulse ox at rest is in the 80s.  Currently pulse ox is 98% on 3 L at rest.  There is no edema but there is orthopnea.  There is no hemoptysis.  There is no wheezing no fever no diarrhea.  Has baseline nausea and it has not changed.  He feels his chest is extremely tight.  There might be some wheezing 2.  The cough is dry but 1 time he did have 1 nickel sized mucus but that is just the 1 time in many days.  He is really worried about his condition.  He does not feel it similar to his previous heart attack.  His wife is wondering about getting a CT chest last CT chest was a year ago.   He does not want steroids.  Especially oral.  He says he is fine with IM steroids.      OV 05/13/2023  Subjective:  Patient ID: Jake KANDICE [name], male , DOB: December 05, 1960 , age 64 y.o. , MRN: [id number] , ADDRESS: [address] [place] [address] PCP Antoniette Vermell CROME, PA-C Patient Care Team: Antoniette Vermell CROME DEVONNA as PCP - General (Family Medicine) Pietro Redell RAMAN, MD as PCP - Cardiology (Cardiology) Inocencio Soyla Lunger, MD as PCP - Electrophysiology (Cardiology) Savannah Hastings, PA-C as Referring Physician (Pulmonary Disease) Beverli Cara PARAS, [facility] as Pharmacist (Pharmacist)  This Provider for this visit: Treatment Team:  Attending Provider: Geronimo Amel, MD   05/13/2023 -   Chief Complaint  Patient presents with   Follow-up  O2 2-3 l       HPI ROSCOE WITTS 63 y.o. -presents for follow-up.  Presents with his wife who is an independent historian.  He tells me that the IM  Depo-Medrol  I gave him helped him.  He cannot do oral prednisone  because it changes his mood.  He states his mood gets so bad that even I would not like to know him.  He feels that after initial improvement from the acute flareup he started feeling worse again.  He feels he is chronically getting worse on 04/14/2023 he had ICD and pacemaker implantation but despite that he is getting worse.  Symptom score shows significant decline.  Concomitant with that high-resolution CT chest that I reviewed visualized and showed it to him and his wife shows worsening ILD.  Pulmonary function test also shows a decline.  Although the sit/stand exercise hypoxemia test appears to be the same.  He reports it is getting very difficult for him to go to the bathroom.  We had conversations about his life expectancy which could still be few to several years with a lot of variability.  We discussed treatment options.  He cannot tolerate the antifibrotic's pirfenidone  or nintedanib.  He does not want to do oral prednisone .  We discussed CellCept but he is worried about immunosuppression.  He lives with his grandkids and he adores them.  He says they come with respiratory infections therefore he does not want to do this at this point he wants to do IM Depo-Medrol  on a scheduled basis.  I have reached out to our pharmacy team he is also willing to try IV Solu-Medrol .  We discussed clinical trials as a care option.  He is already in the ILD-Pro registry.  He will have a research visit today.  There is inhaled pirfenidone  and treprostinil studies.  We will prescreen this    CT Chest data from date: HRCT 02/25/23  - personally visualized and independently interpreted : yes - my findings are: as below Narrative & Impression  CLINICAL DATA:  63 year old male with history of prior myocardial infarction. Evaluate for interstitial lung disease.   EXAM: CT CHEST WITHOUT CONTRAST   TECHNIQUE: Multidetector CT imaging of the chest was  performed following the standard protocol without intravenous contrast. High resolution imaging of the lungs, as well as inspiratory and expiratory imaging, was performed.   RADIATION DOSE REDUCTION: This exam was performed according to the departmental dose-optimization program which includes automated exposure control, adjustment of the mA and/or kV according to patient size and/or use of iterative reconstruction technique.   COMPARISON:  Multiple priors, including prior high-resolution chest CT 01/16/2022.   FINDINGS: Cardiovascular: Heart size is normal. There is no significant pericardial fluid, thickening or pericardial calcification. There is aortic atherosclerosis, as well as atherosclerosis of the great vessels of the mediastinum and the coronary arteries, including calcified atherosclerotic plaque in the left anterior descending and right coronary arteries. Calcifications of the aortic valve.   Mediastinum/Nodes: No pathologically enlarged mediastinal or hilar lymph nodes. Please note that accurate exclusion of hilar adenopathy is limited on noncontrast CT scans. Esophagus is unremarkable in appearance. No axillary lymphadenopathy.   Lungs/Pleura: High-resolution images again demonstrate patchy areas of ground-glass attenuation, septal thickening, subpleural reticulation, mild cylindrical bronchiectasis and peripheral bronchiolectasis scattered throughout the lungs bilaterally. There is no discernible craniocaudal gradient to these findings. No frank honeycombing. Findings appear minimally progressive compared to the prior study from 01/16/2022. Inspiratory and expiratory imaging demonstrates mild air trapping  indicative of small airways disease. No acute consolidative airspace disease. No pleural effusions. No definite suspicious appearing pulmonary nodules or masses are noted.   Upper Abdomen: Atherosclerotic calcifications in the abdominal aorta. Status post  cholecystectomy.   Musculoskeletal: Orthopedic fixation hardware in the lower cervical spine incidentally noted. There are no aggressive appearing lytic or blastic lesions noted in the visualized portions of the skeleton.   IMPRESSION: 1. The appearance of the lungs remains compatible with interstitial lung disease, with a spectrum of findings considered most compatible with an alternative diagnosis (not usual interstitial pneumonia) per current ATS guidelines. 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 2. Aortic atherosclerosis, in addition to two-vessel coronary artery disease. Please note that although the presence of coronary artery calcium  documents the presence of coronary artery disease, the severity of this disease and any potential stenosis cannot be assessed on this non-gated CT examination. Assessment for potential risk factor modification, dietary therapy or pharmacologic therapy may be warranted, if clinically indicated. 3. There are calcifications of the aortic valve. Echocardiographic correlation for evaluation of potential valvular dysfunction may be warranted if clinically indicated.   Aortic Atherosclerosis (ICD10-I70.0).     Electronically Signed   By: Toribio Aye M.D.   On: 03/01/2023 12:31     OV 07/15/2023  Subjective:  Patient ID: Jake KANDICE [name], male , DOB: [birth year] , age 63 y.o. , MRN: [id number] , ADDRESS: [address] [place] [address] PCP Antoniette Vermell CROME, PA-C Patient Care Team: Antoniette Vermell CROME DEVONNA as PCP - General (Family Medicine) Pietro Redell RAMAN, MD as PCP - Cardiology (Cardiology) Inocencio Soyla Lunger, MD as PCP - Electrophysiology (Cardiology) Savannah Hastings, PA-C as Referring Physician (Pulmonary Disease) Beverli Cara PARAS, [facility] as Pharmacist (Pharmacist)  This Provider for this visit: Treatment Team:  Attending Provider:  Geronimo Amel, MD      07/15/2023 -   Chief Complaint  Patient presents with   Follow-up    Pt states steroid shot has not been scheduled. Pt states breathing still the same      HPI JOSEMIGUEL GRIES 63 y.o. -presents for follow-up.  Presents with his wife.  She is an independent historian.  Last visit we decided to try IM Depo-Medrol  on a weekly basis to try to control inflammation as long because his progressive phenotype and his ILD.  He cannot tolerate antifibrotic's.  However we run into logistical issues.  It took a while to figure out the right dosing and the schedule with the pharmacy team.  But right now we have shortage of support staff.  Therefore we resolved today that I would reach out to his primary care physician assistant to see if it can be done there.  That office is close to his home anyways.  I did advise him that it may not be exactly on the same day every week it can be plus a -2 days.  Nonetheless from a symptom standpoint he is doing stable although early in the morning he does feel more short of breath when he exerts he is more short of breath he is using his oxygen  day and night.  Right now at rest it is 88% on room air.  He did get IM Depo-Medrol  for a stomach virus a few weeks ago and it did help him according to his history.  Of note I did notice that he was tachycardic today at rest and review of the  records indicate that he said some send tachycardia event at the last visit.  I did mention to him the role of ivabradine and inappropriate sinus tachycardia and heart failure patients.  Did say that 1 patient who got ivabradine in the setting of normal ejection fraction and inappropriate sinus tachycardia did feel better.  This patient had ILD.  I have reached out to Dr. Inocencio to see if this would be an appropriate therapy for the patient.        OV 11/03/2023  Subjective:  Patient ID: Jake KANDICE [name], male , DOB: June 03, 1961 , age 81 y.o. , MRN: [id number]  , ADDRESS: [address] [place] [address] PCP Antoniette Vermell CROME, PA-C Patient Care Team: Antoniette Vermell CROME DEVONNA as PCP - General (Family Medicine) Pietro Redell RAMAN, MD as PCP - Cardiology (Cardiology) Inocencio Soyla Lunger, MD as PCP - Electrophysiology (Cardiology) Savannah Hastings, PA-C as Referring Physician (Pulmonary Disease) Beverli Cara PARAS, [facility] (Inactive) as Pharmacist (Pharmacist)  This Provider for this visit: Treatment Team:  Attending Provider: Geronimo Amel, MD     11/03/2023 -   Chief Complaint  Patient presents with   Follow-up     HPI Jake KANDICE [name] 63 y.o. -presents with his wife.  Overall they feel he is getting worse.  Shortness of breath is getting worse.  He is now needing 4 L at rest and 6 L with exertion such as making a bed.  The weekly steroids which she has been taking for the last 2 months [on pause for the last 3 weeks ) is not helping.  Wife feels it is making worse.  He is more irritable.  They did have a cardiology visit recently.  He did have pulmonary function test his FVC shows decline his DLCO shows stability compared to the fall 2024 but compared to a year ago both are declined.  Most recent echo was in the summer 2024 with low ejection fraction.  They have talked about life expectancy.  I did express to them it could be few years.  Could also be a few months and is variable.  They wanted a letter for financial reasons so I asked the CMA to print 1 out.  They not ready for home palliative care he has good support at home from his wife.  They are interested in knowing the status of the fibrosis.  We aligned to check a D-dimer first and if its normal go to a high-res CT chest.     OV 11/26/2023  Subjective:  Patient ID: Jake KANDICE [name], male , DOB: 12-Dec-1960 , age 38 y.o. , MRN: [id number] , ADDRESS: [address] [place] [address] PCP Antoniette Vermell CROME, PA-C Patient Care Team: Antoniette Vermell CROME DEVONNA as PCP - General  (Family Medicine) Pietro Redell RAMAN, MD as PCP - Cardiology (Cardiology) Inocencio Soyla Lunger, MD as PCP - Electrophysiology (Cardiology) Savannah Hastings, PA-C as Referring Physician (Pulmonary Disease) Beverli Cara PARAS, [facility] (Inactive) as Pharmacist (Pharmacist)     Type of visit: Video Virtual Visit Identification of patient THURMOND HILDEBRAN with 11-16-1960 and MRN [id number] - 2 person identifier Risks: Risks, benefits, limitations of telephone visit explained. Patient understood and verbalized agreement to proceed Anyone else on call: hiw wife Patient location: his car This provider location: [address], [address]; [place]; [place] [address]. [facility]. [phone]   S:   This Provider for this visit: Treatment Team:  Attending Provider: Geronimo Amel, MD  11/26/2023 -presents with his wife for this video visit.  He is sitting in his car.  He tells me that with 4 L of oxygen  he was getting cramps after increasing to 5 L at rest the cramps are reduced.  He feels maybe he was hypoxemic at night.  He still has significant class III dyspnea on exertion.  I rule out pulmonary embolism late February 2025 and then we went to an got high-resolution CT chest.  The fibrotic densities are unchanged compared to summer 2024 but is clear progression between now and 2021, between now and 2022 and between now and 2023.  He is off his steroids.  I did indicate to him the steroids probably did not help him.  At this point even though his progressive phenotype he is stable in the last 9 months.  In terms of his class III dyspnea I did raise the possibility that his systolic heart failure EF 25% is playing a role.  I have asked him to visit with Dr. Pietro.  I believe he is on maximal medical therapy.  I did indicate to him to discuss about heart failure services and if that would be beneficial for him.   HPI ROGERIO BOUTELLE 63 y.o. - CT Chest data from date: 11/09/23  -  personally visualized and independently interpreted : yes - my findings are: Mentioned above in discussion. arrative & Impression  CLINICAL DATA:  Diffuse/interstitial lung disease. Insidious onset of shortness of breath since COVID.   EXAM: CT CHEST WITHOUT CONTRAST   TECHNIQUE: Multidetector CT imaging of the chest was performed following the standard protocol without intravenous contrast. High resolution imaging of the lungs, as well as inspiratory and expiratory imaging, was performed.   RADIATION DOSE REDUCTION: This exam was performed according to the departmental dose-optimization program which includes automated exposure control, adjustment of the mA and/or kV according to patient size and/or use of iterative reconstruction technique.   COMPARISON:  11/05/2023, 02/25/2023, 01/16/2022 and 11/07/2020.   FINDINGS: Cardiovascular: Atherosclerotic calcification of the aorta and aortic valve with age advanced involvement of the coronary arteries. Ascending aorta measures up to approximately 3.9 cm (coronal image 102). Enlarged pulmonic trunk and heart. No pericardial effusion.   Mediastinum/Nodes: No pathologically enlarged mediastinal or axillary lymph nodes. Hilar regions are difficult to definitively evaluate without IV contrast. Esophagus is grossly unremarkable.   Lungs/Pleura: Coarsened interstitial and subpleural ground-glass with traction bronchiectasis/bronchiolectasis and subpleural reticular densities. Findings appear similar to 02/25/2023. Calcified granulomas. No pleural fluid. Airway is unremarkable. There is air trapping.   Upper Abdomen: Visualized portions of the liver, right adrenal gland, spleen, pancreas, stomach and bowel are grossly unremarkable. Left hemidiaphragm is mildly elevated.   Musculoskeletal: Degenerative changes in the spine.   IMPRESSION: 1. Pulmonary parenchymal pattern of interstitial lung disease, as described above, similar to  02/25/2023. 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 Findings are suggestive of an alternative diagnosis (not UIP) per consensus guidelines: Diagnosis of Idiopathic Pulmonary Fibrosis: An Official ATS/ERS/JRS/ALAT Clinical Practice Guideline. Am JINNY Honey Crit Care Med Vol 198, Iss 5, [phone], May 08 2017. 2. Age advanced coronary artery calcification. 3.  Aortic atherosclerosis (ICD10-I70.0).     Electronically Signed   By: Newell Eke M.D.   On: 11/22/2023 11:13    OV 05/02/2024  Subjective:  Patient ID: Jake KANDICE [name], male , DOB: [birth year] , age 58 y.o. , MRN: [id number] , ADDRESS: [address] [place]  [address] PCP Antoniette Vermell CROME, PA-C Patient Care Team: Breeback, Jade L, PA-C as PCP - General (Family Medicine) Pietro Redell RAMAN, MD as PCP - Cardiology (Cardiology) Inocencio Soyla Lunger, MD as PCP - Electrophysiology (Cardiology) Savannah Hastings, [place] as Referring Physician (Pulmonary Disease) Beverli Cara PARAS, [facility] (Inactive) as Pharmacist (Pharmacist)  This Provider for this visit: Treatment Team:  Attending Provider: Geronimo Amel, MD    05/02/2024 -   Chief Complaint  Patient presents with   Medical Management of Chronic Issues   Interstitial Lung Disease    Breathing has been progressively worsening since the last visit.      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 Biopsy being avoided due to risk Noted severe prednisone  intolerance, Esbriet  intolerance and Ofev intolerance On supportive care Trying weekly Depo-Medrol  since late 2024.->  Stopped spring 2025  CT scans  - 2021 -> perhaps some early groundglass opacities -> 2022: Increased groundglass opacities with some early reticulation  -> 2023 scattered groundglass opacities of different pattern > 2024: More fibrotic appearance in the bilateral upper lobe anterior segment and bilateral lower lobe> stable appearance as of March 2025   Associated chronic systolic heart failrue  - ef 35% currently - declined by [facility] lung transplant team - summer 2023 - not a candidate for lung transplatn per Katherene Staple - oct 2023 - s/p MI needing stent march 2024 at [facility]  -Status post ICD and pacemaker implantation 04/14/2023.  Echo June 2024 with EF 25%  Prolonged QTc   #Obstructive sleep apnea on CPAP  #Hospitalization 03/18/2024 - 03/28/2024  - New onset atrial fibrillation -on amiodarone  in the hospital but not at discharge - Acute hypoxemic and hypercapnic respiratory failure requiring BiPAP [in the setting of crackles]  HPI ONNIE HATCHEL 63 y.o. -returns for follow-up.  Presents with his wife.  Since I last saw him he got hospitalized as above in July 2025 for new onset atrial fibrillation.  It is not exactly clear why but he did end up with acute hypoxemic and hypercapnic respiratory failure.  ABG reviewed his pCO2 was in the 70s [[facility]BG here 2 years ago had pCO2 in the low 50s].  He says he nearly got intubated.  They treated him with BiPAP.  Appears he might have  f gotten steroids.  But I am not sure.  He did get amiodarone  but not at discharge.  He is on Eliquis .  He was discharged on 7 L oxygen .  He now says he is improved to 4 L at baseline 6-7 L at exertion.  There is no edema.  He is back to his baseline symptom score.      SYMPTOM SCALE - ILD 02/03/2022 07/20/2022 Off esbreit and ofev 10/29/22 Ef now 30-35% 05/13/2023 Ef 25$ 07/15/2023 Ef 25$ 11/03/2023 Taking Depo-Medrol  every week1 05/02/2024 Of the Depo-Medrol   Current weight 231# 224# - ozempic 229#      O2 use 4L [place] rest  Ra rest      Shortness of Breath 0 -> 5 scale with 5 being worst (score 6 If unable to do)        At rest 2 0 2 2 3 1 2    Simple tasks - showers, clothes change, eating, shaving 5 2 3 4 4 4 4   Household (dishes, doing bed, laundry) 5 4 5  5 5 4 4   Shopping 5 2 1 4 4 4 3   Walking level at own pace 4 3 2 4 4 2 3   Walking up Stairs 5 4 4 5 5 4 4   Total (30-36) Dyspnea Score 26 15 17 24 25 17 20       Non-dyspnea symptoms (0-> 5 scale) 02/03/2022 07/20/2022  10/29/2022  05/13/2023  07/15/2023  11/03/2023  05/02/2024   How bad is your cough? 4 3 1 4 4 3 2   How bad is your fatigue 5 2 3 5 5 4 4   How bad is nausea 4 5 4 4 4 3 3   How bad is vomiting?  0 0 0 0 0 0 1  How bad is diarrhea? 0 1 0 0 2 0 1  How bad is anxiety? 5 1 2 5 4 4 4   How bad is depression 5 1 2 2 4 4 4   Any chronic pain - if so where and how bad 3 x   x 0 0     Simple office walk 185 feet x  3 laps goal with forehead probe 07/20/2022  01/05/2023  02/16/2023  05/13/2023  07/15/2023  05/02/2024   O2 used ra ra 3L ra ra   Number laps completed 3 5 sit stand  5 sit stand rest   Comments about pace avg       Resting Pulse Ox/HR 99% and 85/min 94% 98% at rest 97% and HR 95 88% Room air res -, HR 108 4L - 99% at rest  Final Pulse Ox/HR 93% and 100/min 89%  88% befiefly a minute later    Desaturated </= 88% no       Desaturated <= 3% points Yes 6        Got Tachycardic >/= 90/min yes       Symptoms at end of test Mild dyspea       Miscellaneous comments improved         CT Chest data from date: mrch 2025  - personally visualized and independently interpreted : no - my findings are:  as below    IMPRESSION: 1. Pulmonary parenchymal pattern of interstitial lung disease, as described above, similar to 02/25/2023. 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 Findings are suggestive of an alternative diagnosis (not UIP) per consensus guidelines: Diagnosis of Idiopathic Pulmonary Fibrosis: An Official ATS/ERS/JRS/ALAT Clinical Practice Guideline. Am JINNY Honey Crit Care Med Vol 198, Iss 5,  [phone], May 08 2017. 2. Age advanced coronary artery calcification. 3.  Aortic atherosclerosis (ICD10-I70.0).     Electronically Signed   By: Newell Eke M.D.   On: 11/22/2023 11:13    PFT     Latest Ref Rng & Units 05/11/2023    2:56 PM 10/29/2022   12:49 PM 07/02/2022   11:35 AM 05/04/2022    3:32 PM 01/15/2022    3:58 PM 02/26/2021   12:00 PM  PFT Results  FVC-Pre L 1.93  1.73  2.20  1.87  1.91  2.44   FVC-Predicted Pre % 39  35  44  38  38  49   FVC-Post L     1.97  2.35   FVC-Predicted Post %     40  47   Pre FEV1/FVC % % 91  89  91  94  96  93   Post FEV1/FCV % %     90  91   FEV1-Pre L 1.75  1.55  2.01  1.77  1.83  2.27   FEV1-Predicted Pre % 47  42  54  47  49  60   FEV1-Post L     1.78  2.15   DLCO uncorrected ml/min/mmHg 8.40  10.50  12.27  11.35  14.05  16.66   DLCO UNC% % 29  37  43  40  49  58   DLCO corrected ml/min/mmHg  10.50  12.27  11.93  14.05  16.66   DLCO COR %Predicted %  37  43  42  49  58   DLVA Predicted % 67  94  86  92  105  101   TLC L 8.29     4.36  4.52   TLC % Predicted % 115     60  62   RV % Predicted % 272     60  66        LAB RESULTS last 96 hours No results found.       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   reports that he quit smoking about 15 years ago. His smoking use included cigarettes. He started smoking about 43 years ago. He has a 28 pack-year smoking history. He has never used smokeless tobacco.  Past Surgical History:  Procedure Laterality Date   ANKLE FRACTURE SURGERY Left    APPENDECTOMY     BIOPSY  11/18/2021   Procedure: BIOPSY;  Surgeon: San Sandor GAILS, DO;  Location: [facility];  Service: Gastroenterology;;  EGD and COLON   BIV ICD INSERTION CRT-D N/A 04/14/2023   Procedure: BIV ICD INSERTION CRT-D;  Surgeon: Inocencio Soyla Lunger, MD;  Location: [facility]  INVASIVE CV LAB;  Service: Cardiovascular;  Laterality: N/A;   CERVICAL FUSION     CHOLECYSTECTOMY     COLON SURGERY     COLONOSCOPY WITH PROPOFOL  N/A 11/18/2021   Procedure: COLONOSCOPY WITH PROPOFOL ;  Surgeon: San Sandor GAILS, DO;  Location: [facility];  Service: Gastroenterology;  Laterality: N/A;   ESOPHAGOGASTRODUODENOSCOPY (EGD) WITH PROPOFOL  N/A 11/18/2021   Procedure: ESOPHAGOGASTRODUODENOSCOPY (EGD) WITH PROPOFOL ;  Surgeon: San Sandor GAILS, DO;  Location: [facility];  Service: Gastroenterology;  Laterality: N/A;   HERNIA REPAIR     POLYPECTOMY  11/18/2021   Procedure: POLYPECTOMY;  Surgeon: San Sandor GAILS, DO;  Location: [facility];  Service: Gastroenterology;;   RIGHT HEART CATH N/A 02/13/2022   Procedure: RIGHT HEART CATH;  Surgeon: Verlin Lonni BIRCH, MD;  Location: [facility] INVASIVE CV LAB;  Service: Cardiovascular;  Laterality: N/A;    Allergies  Allergen Reactions   Fentanyl  Other (See Comments)    Avoid opiates Other reaction(s): Other Avoid opiates   Other     04/06/18 Patient is a recovering narcotic abuser and, per wife, is NEVER to receive controlled substances (e.g. Narcotics, sedatives, etc.) Other reaction(s): Unknown 04/06/18 Patient is a recovering narcotic abuser and, per wife, is NEVER to receive controlled substances (e.g. Narcotics, sedatives, etc.)   Aspirin  Nausea And Vomiting   Lipitor [Atorvastatin] Nausea And Vomiting    GI side effects.     Prednisone  Other (See Comments)     Agitation Severe anger outbursts   Gabapentin     Excessive somnolence, unsteadiness, falls   Ofev [Nintedanib]     GI side effects   Pirfenidone     Nitroglycerin  Nausea And Vomiting    Can take with Nausea Meditcation  Immunization History  Administered Date(s) Administered   Influenza Inj Mdck Quad Pf 06/18/2022   Influenza Split 07/16/2014   Influenza, Seasonal, Injecte, Preservative Fre 08/30/2023   Influenza,inj,Quad PF,6+ Mos 07/16/2014, 07/21/2017,  08/22/2018, 06/13/2019, 06/10/2021   Influenza-Unspecified 07/16/2014, 06/14/2016   PFIZER Comirnaty(Gray Top)Covid-19 Tri-Sucrose Vaccine 06/18/2022   PFIZER(Purple Top)SARS-COV-2 Vaccination 11/21/2019, 12/24/2019, 06/23/2020   PNEUMOCOCCAL CONJUGATE-20 09/13/2023   Pfizer(Comirnaty)Fall Seasonal Vaccine 12 years and older 08/20/2023   Respiratory Syncytial Virus Vaccine ,Recomb Aduvanted(Arexvy ) 07/23/2022, 09/27/2023   Td 04/15/2015   Td (Adult), 2 Lf Tetanus Toxid, Preservative Free 04/15/2015   Td (Adult),5 Lf Tetanus Toxid, Preservative Free 04/15/2015   Tdap 10/10/2009   Zoster Recombinant(Shingrix) 06/10/2021    Family History  Problem Relation Age of Onset   Cancer Mother        breast   Depression Mother    Hyperlipidemia Mother    Dementia Mother    Alcohol abuse Father    Cancer Father        skin   Hyperlipidemia Father    Alzheimer's disease Father    Cancer Maternal Grandfather    Cancer Paternal Grandmother    Pancreatic cancer Paternal Uncle    Colon cancer Neg Hx    Esophageal cancer Neg Hx    Stomach cancer Neg Hx      Current Outpatient Medications:    albuterol  (VENTOLIN  HFA) 108 (90 Base) MCG/ACT inhaler, INHALE 2 PUFFS BY MOUTH EVERY 6 HOURS AS NEEDED, Disp: 18 each, Rfl: 3   AMBULATORY NON FORMULARY MEDICATION, Continuous positive airway pressure (CPAP) machine set at autotitration of H2O pressure, with all supplemental supplies as needed., Disp: 1 each, Rfl: 0   aspirin  81 MG chewable tablet, Chew 81 mg by mouth daily., Disp: , Rfl:    busPIRone  (BUSPAR ) 15 MG tablet, Take 1 tablet (15 mg total) by mouth 2 (two) times daily., Disp: 180 tablet, Rfl: 0   cetirizine (ZYRTEC) 10 MG tablet, Take 10 mg by mouth daily., Disp: , Rfl:    colestipol  (COLESTID ) 1 g tablet, TAKE 1 TABLET BY MOUTH EVERY DAY, Disp: 90 tablet, Rfl: 1   dicyclomine  (BENTYL ) 20 MG tablet, TAKE 1 TABLET BY MOUTH THREE TIMES A DAY, Disp: 270 tablet, Rfl: 1   ELIQUIS  5 MG TABS tablet,  Take 1 tablet (5 mg total) by mouth 2 (two) times daily. Take by mouth., Disp: 60 tablet, Rfl: 5   empagliflozin  (JARDIANCE ) 10 MG TABS tablet, Take 1 tablet (10 mg total) by mouth daily before breakfast., Disp: 30 tablet, Rfl: 11   ezetimibe  (ZETIA ) 10 MG tablet, TAKE 1 TABLET BY MOUTH EVERYDAY AT BEDTIME, Disp: 90 tablet, Rfl: 3   finasteride  (PROSCAR ) 5 MG tablet, TAKE 1 TABLET (5 MG TOTAL) BY MOUTH DAILY., Disp: 90 tablet, Rfl: 2   furosemide  (LASIX ) 20 MG tablet, Take 40 mg by mouth daily., Disp: , Rfl:    hydrALAZINE  (APRESOLINE ) 50 MG tablet, Take 1 tablet (50 mg total) by mouth 3 (three) times daily. (Patient taking differently: Take 75 mg by mouth 3 (three) times daily.), Disp: 270 tablet, Rfl: 3   ipratropium-albuterol  (DUONEB) 0.5-2.5 (3) MG/3ML SOLN, TAKE 3 MLS BY NEBULIZATION EVERY 2 (TWO) HOURS AS NEEDED (WHEEZE, SHORTNESS OF BREATH)., Disp: 90 mL, Rfl: 3   Misc. Devices (PULSE OXIMETER) MISC, Monitor oxygen  three times per day., Disp: 1 each, Rfl: 0   modafinil  (PROVIGIL ) 200 MG tablet, Take 1 tablet (200 mg total) by mouth daily., Disp: 90 tablet, Rfl: 0   OXYGEN ,  Inhale 3-4 L into the lungs continuous., Disp: , Rfl:    PACERONE  200 MG tablet, Take 1 tablet (200 mg total) by mouth daily., Disp: 90 tablet, Rfl: 0   pantoprazole  (PROTONIX ) 40 MG tablet, TAKE 1 TABLET BY MOUTH TWICE A DAY, Disp: 180 tablet, Rfl: 1   potassium chloride  (KLOR-CON  M) 10 MEQ tablet, TAKE 1 TABLET BY MOUTH TWICE A DAY, Disp: 180 tablet, Rfl: 1   prochlorperazine  (COMPAZINE ) 5 MG tablet, Take 1 tablet (5 mg total) by mouth every 8 (eight) hours as needed for nausea or vomiting., Disp: 30 tablet, Rfl: 1   sacubitril -valsartan  (ENTRESTO ) 97-103 MG, Take 1 tablet by mouth 2 (two) times daily., Disp: 60 tablet, Rfl: 2   sertraline  (ZOLOFT ) 100 MG tablet, Take 2 tablets (200 mg total) by mouth daily., Disp: 180 tablet, Rfl: 0   sodium bicarbonate  650 MG tablet, TAKE ONE TABLET BY MOUTH 4 TIMES DAILY, Disp: 360  tablet, Rfl: 1   spironolactone  (ALDACTONE ) 25 MG tablet, TAKE 1/2 TABLET BY MOUTH EVERY DAY, Disp: 45 tablet, Rfl: 3   TOPROL  XL 50 MG 24 hr tablet, Take by mouth., Disp: , Rfl:    loratadine  (CLARITIN ) 10 MG tablet, Take 10 mg by mouth in the morning., Disp: , Rfl:    Phenylephrine -Acetaminophen  (TYLENOL  SINUS CONGESTION/PAIN PO), Take 1 tablet by mouth daily as needed (Congestion)., Disp: , Rfl:    triamcinolone  (NASACORT ) 55 MCG/ACT AERO nasal inhaler, Place 2-3 sprays into the nose daily as needed (allergies (when pollen is very high))., Disp: , Rfl:       Objective:   Vitals:   05/02/24 1129  BP: (!) 140/84  Pulse: 74  SpO2: 99%  Weight: 234 lb (106.1 kg)  Height: 5' 11 (1.803 m)    Estimated body mass index is 32.64 kg/m as calculated from the following:   Height as of this encounter: 5' 11 (1.803 m).   Weight as of this encounter: 234 lb (106.1 kg).  @WEIGHTCHANGE @  [employer]   05/02/24 1129  Weight: 234 lb (106.1 kg)     Physical Exam   General: No distress. Flast affect O2 at rest: yes Cane present: no Sitting in wheel chair: no Frail: no Obese: no Neuro: Alert and Oriented x 3. GCS 15. Speech normal Psych: Pleasant Resp:  Barrel Chest - no.  Wheeze - no, Crackles - mild a base, No overt respiratory distress CVS: Normal heart sounds. Murmurs - no Ext: Stigmata of Connective Tissue Disease - no HEENT: Normal upper airway. PEERL +. No post nasal drip        Assessment/     Assessment & Plan Hospital discharge follow-up  Chronic respiratory failure with hypoxia (HCC)  ILD (interstitial lung disease) (HCC)  OSA on CPAP  Chronic systolic congestive heart failure, NYHA class 3 (HCC)    PLAN Patient Instructions     ICD-10-CM   1. Hospital discharge follow-up  Z09     2. Chronic respiratory failure with hypoxia (HCC)  J96.11     3. ILD (interstitial lung disease) (HCC)  J84.9     4. OSA on CPAP  G47.33     5. Chronic systolic  congestive heart failure, NYHA class 3 (HCC)  I50.22        ABG in July 2025 hospitalization suggests new/ worsening CO2 retention    Plan - shared decision making - CHeck ABG next few weeks to few days   - if still CO2 high, need to change cPAP  to BiPAP - Do spirometry and DLCO in 3 months  - depending on results likely might qualify for new anti-fibrotic NERANDROMILAST - cotninue o2  4L rest and 6-7 L with exertion   Followup 3 months visit but after spirometry and DLCO; 30 min visit    FOLLOWUP    Return in about 3 months (around 08/02/2024) for 30 min visit, after Spiro and DLCO, with Dr Geronimo, Face to Face Visit.    SIGNATURE    Dr. Dorethia Geronimo, M.D., F.C.C.P,  Pulmonary and Critical Care Medicine Staff Physician, [facility] Director - Interstitial Lung Disease  Program  Pulmonary Fibrosis [facility] at [facility] [place], [place], [address]  Pager: [phone], If no answer or between  15:00h - 7:00h: call [phone] Telephone: [phone]  5:27 PM 05/02/2024

## 2024-05-02 NOTE — Patient Instructions (Addendum)
 ICD-10-CM   1. Hospital discharge follow-up  Z09     2. Chronic respiratory failure with hypoxia (HCC)  J96.11     3. ILD (interstitial lung disease) (HCC)  J84.9     4. OSA on CPAP  G47.33     5. Chronic systolic congestive heart failure, NYHA class 3 (HCC)  I50.22        ABG in July 2025 hospitalization suggests new/ worsening CO2 retention    Plan - shared decision making - CHeck ABG next few weeks to few days   - if still CO2 high, need to change cPAP to BiPAP - Do spirometry and DLCO in 3 months  - depending on results likely might qualify for new anti-fibrotic NERANDROMILAST - cotninue o2  4L rest and 6-7 L with exertion   Followup 3 months visit but after spirometry and DLCO; 30 min visit

## 2024-05-09 ENCOUNTER — Encounter (HOSPITAL_COMMUNITY)

## 2024-05-09 ENCOUNTER — Encounter: Payer: Self-pay | Admitting: Sports Medicine

## 2024-05-09 ENCOUNTER — Other Ambulatory Visit: Payer: Self-pay | Admitting: Physician Assistant

## 2024-05-09 DIAGNOSIS — I480 Paroxysmal atrial fibrillation: Secondary | ICD-10-CM

## 2024-05-12 ENCOUNTER — Ambulatory Visit: Admitting: Sports Medicine

## 2024-05-17 ENCOUNTER — Ambulatory Visit (INDEPENDENT_AMBULATORY_CARE_PROVIDER_SITE_OTHER): Admitting: Physician Assistant

## 2024-05-17 ENCOUNTER — Encounter: Payer: Self-pay | Admitting: Physician Assistant

## 2024-05-17 ENCOUNTER — Ambulatory Visit: Payer: Self-pay | Admitting: Physician Assistant

## 2024-05-17 ENCOUNTER — Ambulatory Visit

## 2024-05-17 VITALS — BP 160/80 | HR 80 | Ht 71.0 in | Wt 236.0 lb

## 2024-05-17 DIAGNOSIS — Z09 Encounter for follow-up examination after completed treatment for conditions other than malignant neoplasm: Secondary | ICD-10-CM | POA: Diagnosis not present

## 2024-05-17 DIAGNOSIS — R051 Acute cough: Secondary | ICD-10-CM | POA: Diagnosis not present

## 2024-05-17 DIAGNOSIS — R059 Cough, unspecified: Secondary | ICD-10-CM

## 2024-05-17 DIAGNOSIS — J849 Interstitial pulmonary disease, unspecified: Secondary | ICD-10-CM | POA: Diagnosis not present

## 2024-05-17 DIAGNOSIS — R110 Nausea: Secondary | ICD-10-CM

## 2024-05-17 DIAGNOSIS — R5383 Other fatigue: Secondary | ICD-10-CM

## 2024-05-17 DIAGNOSIS — Z8709 Personal history of other diseases of the respiratory system: Secondary | ICD-10-CM

## 2024-05-17 DIAGNOSIS — I5022 Chronic systolic (congestive) heart failure: Secondary | ICD-10-CM | POA: Diagnosis not present

## 2024-05-17 DIAGNOSIS — J9611 Chronic respiratory failure with hypoxia: Secondary | ICD-10-CM

## 2024-05-17 DIAGNOSIS — R5381 Other malaise: Secondary | ICD-10-CM

## 2024-05-17 DIAGNOSIS — R918 Other nonspecific abnormal finding of lung field: Secondary | ICD-10-CM | POA: Diagnosis not present

## 2024-05-17 DIAGNOSIS — Z8616 Personal history of COVID-19: Secondary | ICD-10-CM

## 2024-05-17 DIAGNOSIS — Z95810 Presence of automatic (implantable) cardiac defibrillator: Secondary | ICD-10-CM | POA: Diagnosis not present

## 2024-05-17 DIAGNOSIS — I480 Paroxysmal atrial fibrillation: Secondary | ICD-10-CM

## 2024-05-17 MED ORDER — FUROSEMIDE 10 MG/ML IJ SOLN
10.0000 mg | Freq: Once | INTRAMUSCULAR | Status: AC
  Administered 2024-05-17: 10 mg via INTRAMUSCULAR

## 2024-05-17 MED ORDER — METHYLPREDNISOLONE SODIUM SUCC 125 MG IJ SOLR
125.0000 mg | Freq: Once | INTRAMUSCULAR | Status: AC
  Administered 2024-05-17: 125 mg via INTRAMUSCULAR

## 2024-05-17 MED ORDER — METHYLPREDNISOLONE ACETATE 80 MG/ML IJ SUSP
80.0000 mg | Freq: Once | INTRAMUSCULAR | Status: AC
  Administered 2024-05-17: 80 mg via INTRAMUSCULAR

## 2024-05-17 NOTE — Progress Notes (Unsigned)
   Acute Office Visit  Subjective:     Patient ID: Jake [name], male    DOB: 12/29/1960, 63 y.o.   MRN: [id number]  Chief Complaint  Patient presents with   Covid Positive    Post covid sob and labored breathing onset for 10days    HPI Patient is a 63 y/o male with PMH of ILD, CHF, and Chronic respiratory failure in today for persistent symptoms after testing positive for COVID at home a week and half ago. Wife is with him today who also had COVID before he did. He did not take any antiviral. He is concerned he may have pneumonia because he has had it in the past. His dyspnea has worsened from his baseline, worsens with laying flat and alleviates with sitting up so he has slept in the recliner for the past 3 nights. He is also positive for pleuritic chest pain which he states feels different from the chest pain he had with his heart attack and afib. He has used his prescribed albuterol  and twice daily duoneb treatments which has provided minimal relief. He states his cough has not changed much from his baseline dry, nagging cough other than a couple of times this week when he was able to produce a small amount of brownish-green sputum. He denies rhinorrhea, fever, and chills.   Negative for ankle edema but wife and patient are concerned he may be retaining fluid in his abdomen.   He is requesting refill on Compazine . Also requesting his Metoprolol  get fixed because the pharmacy filled it wrong.      ROS See HPI.      Objective:    BP (!) 160/80   Pulse 80   Ht 5' 11 (1.803 m)   Wt 236 lb (107 kg)   SpO2 93%   BMI 32.92 kg/m  BP Readings from Last 3 Encounters:  05/17/24 (!) 160/80  05/02/24 (!) 140/84  03/28/24 137/84   Wt Readings from Last 3 Encounters:  05/17/24 236 lb (107 kg)  05/02/24 234 lb (106.1 kg)  03/28/24 237 lb (107.5 kg)      Physical Exam       Assessment & Plan:  SABRASABRATyshawn was seen today for covid positive.  Diagnoses and all orders for  this visit:  ILD (interstitial lung disease) (HCC) -     methylPREDNISolone  sodium succinate  (SOLU-MEDROL ) 125 mg/2 mL injection 125 mg -     methylPREDNISolone  acetate (DEPO-MEDROL ) injection 80 mg  History of COVID-19 -     DG Chest 2 View; Future -     methylPREDNISolone  sodium succinate  (SOLU-MEDROL ) 125 mg/2 mL injection 125 mg -     methylPREDNISolone  acetate (DEPO-MEDROL ) injection 80 mg  Malaise and fatigue -     DG Chest 2 View; Future  Acute cough -     DG Chest 2 View; Future -     methylPREDNISolone  sodium succinate  (SOLU-MEDROL ) 125 mg/2 mL injection 125 mg -     methylPREDNISolone  acetate (DEPO-MEDROL ) injection 80 mg  Chronic respiratory failure with hypoxia (HCC) -     methylPREDNISolone  sodium succinate  (SOLU-MEDROL ) 125 mg/2 mL injection 125 mg -     methylPREDNISolone  acetate (DEPO-MEDROL ) injection 80 mg  Chronic systolic congestive heart failure, NYHA class 3 (HCC) -     furosemide  (LASIX ) injection 10 mg    Clare Casto, PA-C

## 2024-05-17 NOTE — Progress Notes (Signed)
 Discussed with patient and patient wife at office visit today. No acute changes. ILD

## 2024-05-18 MED ORDER — PROCHLORPERAZINE MALEATE 5 MG PO TABS: 5.0000 mg | ORAL_TABLET | Freq: Three times a day (TID) | ORAL | 1 refills | Status: DC | PRN

## 2024-05-18 MED ORDER — TOPROL XL 50 MG PO TB24: 50.0000 mg | ORAL_TABLET | Freq: Every day | ORAL | 1 refills | Status: DC

## 2024-05-23 ENCOUNTER — Other Ambulatory Visit: Payer: Self-pay | Admitting: Cardiology

## 2024-05-24 MED ORDER — SACUBITRIL-VALSARTAN 97-103 MG PO TABS: 1.0000 | ORAL_TABLET | Freq: Two times a day (BID) | ORAL | 2 refills | Status: DC

## 2024-05-26 ENCOUNTER — Ambulatory Visit (HOSPITAL_COMMUNITY)
Admission: RE | Admit: 2024-05-26 | Discharge: 2024-05-26 | Disposition: A | Discharge status: Home / Self Care | Source: Ambulatory Visit | Attending: Internal Medicine | Admitting: Internal Medicine

## 2024-05-26 DIAGNOSIS — J849 Interstitial pulmonary disease, unspecified: Secondary | ICD-10-CM | POA: Insufficient documentation

## 2024-05-26 DIAGNOSIS — J9611 Chronic respiratory failure with hypoxia: Secondary | ICD-10-CM | POA: Diagnosis not present

## 2024-05-26 LAB — BLOOD GAS, ARTERIAL
Acid-Base Excess: 7.3 mmol/L — ABNORMAL HIGH (ref 0.0–2.0)
Bicarbonate: 33.6 mmol/L — ABNORMAL HIGH (ref 20.0–28.0)
O2 Saturation: 98.3 %
Patient temperature: 37
pCO2 arterial: 53 mmHg — ABNORMAL HIGH (ref 32–48)
pH, Arterial: 7.41 (ref 7.35–7.45)
pO2, Arterial: 95 mmHg (ref 83–108)

## 2024-05-26 NOTE — Progress Notes (Signed)
 RT NOTE:  ABG obtained with no complications. Results as followed.  Latest Reference Range & Units 05/26/24 12:53  pH, Arterial 7.35 - 7.45  7.41  pCO2 arterial 32 - 48 mmHg 53 (H)  pO2, Arterial 83 - 108 mmHg 95  Acid-Base Excess 0.0 - 2.0 mmol/L 7.3 (H)  Bicarbonate 20.0 - 28.0 mmol/L 33.6 (H)  O2 Saturation % 98.3  Patient temperature  37.0  Collection site  LEFT RADIAL  Allens test (pass/fail) PASS  PASS  (H): Data is abnormally high  Patient on 5L [place] during blood draw.

## 2024-05-28 DIAGNOSIS — J849 Interstitial pulmonary disease, unspecified: Secondary | ICD-10-CM | POA: Diagnosis not present

## 2024-05-31 ENCOUNTER — Encounter: Payer: Self-pay | Admitting: Internal Medicine

## 2024-06-01 ENCOUNTER — Ambulatory Visit: Payer: Self-pay

## 2024-06-01 NOTE — Telephone Encounter (Signed)
 FYI Only or Action Required?: Action required by provider: request for appointment.  Patient was last seen in primary care on 05/17/2024 by Antoniette Vermell CROME, PA-C.  Called Nurse Triage reporting Headache.  Symptoms began several days ago.  Interventions attempted: OTC medications: tylenol .  Symptoms are: gradually worsening.  Triage Disposition: See HCP Within 4 Hours (Or PCP Triage)  Patient/caregiver understands and will follow disposition?: No, wishes to speak with PCP  Copied from CRM #[id number]. Topic: Clinical - Red Word Triage >> Jun 01, 2024 10:03 AM Mercer PEDLAR wrote: Red Word that prompted transfer to Nurse Triage: Headache in the back of head for several days, pain is 8/10. Reason for Disposition  [1] SEVERE headache (e.g., excruciating) AND [2] not improved after 2 hours of pain medicine  Answer Assessment - Initial Assessment Questions No available appts today with PCP or clinic. Offered appt at [facility], pt declined other providers and clinics; only wants to see Vermell. Advised UC today, pt declined. ED if symptoms worsen.  1. LOCATION: Where does it hurt?      Back right bottom of head 2. ONSET: When did the headache start? (e.g., minutes, hours, days)      4 days 3. PATTERN: Does the pain come and go, or has it been constant since it started?     constant 4. SEVERITY: How bad is the pain? and What does it keep you from doing?  (e.g., Scale 1-10; mild, moderate, or severe)     8/10; takes Tylenol  goes to 3/10, took tylenol  2 hours ago was not effective. 5. RECURRENT SYMPTOM: Have you ever had headaches before? If Yes, ask: When was the last time? and What happened that time?      Years ago 6. CAUSE: What do you think is causing the headache?     Not sure 7. MIGRAINE: Have you been diagnosed with migraine headaches? If Yes, ask: Is this headache similar?      Yes, similar to it, have not had in years 8. HEAD INJURY: Has there been any recent  injury to your head?      no 9. OTHER SYMPTOMS: Do you have any other symptoms? (e.g., fever, stiff neck, eye pain, sore throat, cold symptoms)     Nausea Denies dizziness, blurred vision, vomiting, no cold symptoms, stiff neck, fever, chills  Protocols used: Headache-A-AH

## 2024-06-01 NOTE — Telephone Encounter (Signed)
 Please advise on ABG

## 2024-06-02 ENCOUNTER — Encounter: Payer: Self-pay | Admitting: Physician Assistant

## 2024-06-02 ENCOUNTER — Ambulatory Visit: Admitting: Physician Assistant

## 2024-06-02 VITALS — BP 136/72 | HR 80 | Temp 98.6°F | Ht 71.0 in | Wt 233.0 lb

## 2024-06-02 DIAGNOSIS — J849 Interstitial pulmonary disease, unspecified: Secondary | ICD-10-CM | POA: Diagnosis not present

## 2024-06-02 DIAGNOSIS — R519 Headache, unspecified: Secondary | ICD-10-CM

## 2024-06-02 DIAGNOSIS — J9611 Chronic respiratory failure with hypoxia: Secondary | ICD-10-CM

## 2024-06-02 MED ORDER — DEXAMETHASONE SODIUM PHOSPHATE 10 MG/ML IJ SOLN
10.0000 mg | Freq: Once | INTRAMUSCULAR | Status: AC
  Administered 2024-06-02: 10 mg via INTRAMUSCULAR

## 2024-06-02 MED ORDER — KETOROLAC TROMETHAMINE 60 MG/2ML IM SOLN
60.0000 mg | Freq: Once | INTRAMUSCULAR | Status: AC
  Administered 2024-06-02: 60 mg via INTRAMUSCULAR

## 2024-06-02 MED ORDER — TIZANIDINE HCL 4 MG PO CAPS: 4.0000 mg | ORAL_CAPSULE | Freq: Three times a day (TID) | ORAL | 0 refills | Status: DC

## 2024-06-02 NOTE — Progress Notes (Unsigned)
   Acute Office Visit  Subjective:     Patient ID: Jake [name], male    DOB: 09-06-1961, 63 y.o.   MRN: [id number]  Chief Complaint  Patient presents with   Migraine    Onset 5 days ago Dark quiet rooms are the best at the moment Located on the back right side of the head Pressure makes pain worse    HPI Patient is in today for right sided headache for last 5 days. Pt has chronic respiratory failure due to ILD and on 4L of O2 and CPAP at night. He remembers a similar HA last time he was in the ED and had to be put on Bipap. He wonders if he needs bipap. He had blood gases done 7 days ago. He has sent message to pulmonology with no response. No sick contacts. He did just recover from covid. He is nauseated and light sensitive. He is very sensitive to even lay on that side of his head.   ROS See HPI.      Objective:    BP 136/72 (BP Location: Left Arm, Patient Position: Sitting, Cuff Size: Normal)   Pulse 80   Temp 98.6 F (37 C) (Oral)   Ht 5' 11 (1.803 m)   Wt 233 lb (105.7 kg)   SpO2 98% Comment: on 4L of O2.  BMI 32.50 kg/m  BP Readings from Last 3 Encounters:  06/02/24 136/72  05/17/24 (!) 160/80  05/02/24 (!) 140/84   Wt Readings from Last 3 Encounters:  06/02/24 233 lb (105.7 kg)  05/17/24 236 lb (107 kg)  05/02/24 234 lb (106.1 kg)      Physical Exam HENT:     Head: Normocephalic.     Comments: No rash. Tenderness to palpation over right occipital head.  Cardiovascular:     Rate and Rhythm: Normal rate and regular rhythm.     Pulses: Normal pulses.  Pulmonary:     Comments: Labored breathing without wheezing on 4L of O2.  Musculoskeletal:     Right lower leg: No edema.     Left lower leg: No edema.  Neurological:     General: No focal deficit present.     Mental Status: He is alert and oriented to person, place, and time.  Psychiatric:        Mood and Affect: Mood normal.          Assessment & Plan:  SABRASABRADayron was seen today for  migraine.  Diagnoses and all orders for this visit:  Right-sided headache -     ketorolac  (TORADOL ) injection 60 mg -     dexamethasone  (DECADRON ) injection 10 mg -     tiZANidine  (ZANAFLEX ) 4 MG capsule; Take 1 capsule (4 mg total) by mouth 3 (three) times daily.  Chronic respiratory failure with hypoxia (HCC)  ILD (interstitial lung disease) (HCC)  Other orders -     Discontinue: tiZANidine  (ZANAFLEX ) 4 MG capsule; Take 1 capsule (4 mg total) by mouth 3 (three) times daily.  ? Shingles but no rash vs occipital neuralgia vs migraine potentially triggered by hypercapnia  Toradol  and decadron  given IM today Tizanidine  for muscle relaxer Use icy hot and make sure doing neck stretches Spend more time on CPAP machine Reviewed blood gasses, PCO2 elevated but not to level it was at in the hospital  Wait on pulmonology reply   Vermell Bologna, PA-C

## 2024-06-02 NOTE — Progress Notes (Signed)
Remote ICD Transmission.

## 2024-06-02 NOTE — Patient Instructions (Addendum)
 Spend more time on CPAP with good deep breaths Zanaflex  for muscle spasms and tightness  Back of the Head Pain (Occipital Neuralgia): What to Know  Occipital neuralgia is a headache that causes very bad pain in the back of the head. This pain may spread to other parts of the head. The pain may be caused by irritation of the nerves in the spinal cord. The nerves are found just below the base of the head. These nerves transmit feeling from the back of the head, the top of the head, and the areas behind the ears. What are the causes? This headache can happen without any cause. This is known as primary headache syndrome. In some cases, the pain may be caused by problems that irritate or put pressure on the nerves. These include: A muscle spasm in the neck. Neck injury. Arthritis, or wear and tear of the neck bones. Disease of the disks that separate the neck bones. Swollen blood vessels that put pressure on the nerves. Infections. Tumors. What are the signs or symptoms? This problem causes brief pain that feels like burning or stabbing, or like an electric shooting pain in the back of the head. This pain may spread to the top of the head. It can happen on one side or both sides of the head. You may also feel: Pain behind the eye. Pain when you move your neck, or when you brush your hair. Soreness in the scalp. Aching in the back of the head between times of very bad pain. Pain that gets worse when you're around bright lights. How is this diagnosed?  This problem may be diagnosed based on your symptoms and a physical exam. A health care provider may put pressure on certain nerves in the neck to find where the pain is. Other tests may also be done, such as: MRI or CT scan of the brain and neck. Shots of medicine into the nerves to numb the area and see if the pain goes away. This is called a nerve block. How is this treated? Treatment for this condition may begin with rest, massage, and  over-the-counter pain medicine. You may also use an ice pack or a heating pad. If these treatments don't work, you may need stronger medicines that are prescribed by your provider. These include: Medicines to treat swelling. Medicines to relax your muscles. Medicines for seizures that also treat pain. Medicines for depression that also treat pain. Shots to numb the area and lessen swelling. These are called anesthetics or steroids. Pulsed radiofrequency ablation. This is when wires are used to give electrical signals that block pain from the nerves. Surgery to relieve pressure on the nerves. Physical therapy. Follow these instructions at home: Managing pain     Stop activities that cause pain, if possible. Rest when you have an attack of pain. Try gentle massage to decrease the pain. Try a different pillow or sleeping position. Use heat as told. Use the heat source that your provider recommends, such as a moist heat pack or a heating pad. Do this as often as told. Place a towel between your skin and the heat source. Leave the heat on for 20-30 minutes. Use ice or an ice pack as told. Place a towel between your skin and the ice. Leave the ice on for 20 minutes, 2-3 times a day. If your skin turns red, take off the ice or heat right away to prevent skin damage. The risk of damage is higher if you can't feel  pain, heat, or cold. General instructions Take your medicines only as told. Avoid things that make your pain worse, like bright lights. Try to stay active. Get regular exercise that doesn't cause pain. Ask your provider to suggest safe exercises for you. Work with a physical therapist to learn stretching exercises you can do at home. Use good posture. Contact a health care provider if: Your medicine is not working. You have new or worse symptoms. Get help right away if: You have very bad head pain that doesn't go away. You have a sudden change in vision, balance, or  speech. These symptoms may be an emergency. Call 911 right away. Do not wait to see if the symptoms will go away. Do not drive yourself to the hospital. This information is not intended to replace advice given to you by your health care provider. Make sure you discuss any questions you have with your health care provider. Document Revised: 05/28/2023 Document Reviewed: 05/28/2023 Elsevier Patient Education  2025 [employer].

## 2024-06-02 NOTE — Telephone Encounter (Signed)
 Can we work him in today? This afternoon.

## 2024-06-04 MED ORDER — TIZANIDINE HCL 4 MG PO CAPS: 4.0000 mg | ORAL_CAPSULE | Freq: Three times a day (TID) | ORAL | 0 refills | Status: DC

## 2024-06-07 NOTE — Progress Notes (Deleted)
 HPI: Follow-up CAD.  CPX March 2022 showed submaximal effort, moderate functional impairment due to pulmonary restriction.  High resolution chest CT March 2022 showed interstitial lung disease and aortic atherosclerosis; enlarged pulmonary trunk suggestive of pulmonary hypertension.  Cardiac catheterization March 2022 showed normal left main, minimal irregularities in the LAD, 2 small diagonals, circumflex, 2 obtuse marginals, posterior lateral, PDA and nondominant right coronary artery; ejection fraction 30 to 35% with mild left ventricular enlargement.  Echocardiogram June 2023 showed ejection fraction 40 to 45%, grade 1 diastolic dysfunction, moderate left atrial enlargement, mild mitral regurgitation.  Right heart catheterization June 2023 showed cardiac output 6.59 L/min, cardiac index 2.96 L/min, RA pressure 11, PA pressure 25/12 and pulmonary capillary wedge pressure 8. CTA January 2024 showed pneumonitis and atheromatous changes of the thoracic aorta but no aneurysm.     Patient admitted to [facility] March 2024 with STEMI.  Cardiac catheterization revealed 95 to 99% mid circumflex, 30% LAD, 50% PDA.  Patient had PCI of the left circumflex with drug-eluting stent.  Echocardiogram repeated February 23, 2023 and showed ejection fraction 25%, hypokinesis of the anterolateral and inferoseptal walls, severe left ventricular enlargement, mild left ventricular hypertrophy, grade 1 diastolic dysfunction, mild left atrial enlargement, mild mitral regurgitation and mildly dilated aortic root at 42 mm.  High-resolution chest CT June 2024 showed interstitial lung disease but no aneurysm.  Had biventricular ICD placed August 2024.  Carotid Dopplers February 2025 showed 40 to 59% right and 1 to 39% left stenosis.  CTA February 2025 showed no pulmonary embolus.  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    Patient admitted in [facility] July 2025 with  complaints of increased shortness of breath and chest pain.  Found to be in new onset atrial fibrillation.  Opponent minimally elevated.  He was placed on amiodarone  and converted to sinus rhythm.  Since last seen  Current Outpatient Medications  Medication Sig Dispense Refill   albuterol  (VENTOLIN  HFA) 108 (90 Base) MCG/ACT inhaler INHALE 2 PUFFS BY MOUTH EVERY 6 HOURS AS NEEDED 18 each 3   AMBULATORY NON FORMULARY MEDICATION Continuous positive airway pressure (CPAP) machine set at autotitration of H2O pressure, with all supplemental supplies as needed. 1 each 0   amiodarone  (PACERONE ) 200 MG tablet TAKE 1 TABLET BY MOUTH EVERY DAY 90 tablet 0   aspirin  81 MG chewable tablet Chew 81 mg by mouth daily.     busPIRone  (BUSPAR ) 15 MG tablet Take 1 tablet (15 mg total) by mouth 2 (two) times daily. 180 tablet 0   cetirizine (ZYRTEC) 10 MG tablet Take 10 mg by mouth daily.     colestipol  (COLESTID ) 1 g tablet TAKE 1 TABLET BY MOUTH EVERY DAY 90 tablet 1   dicyclomine  (BENTYL ) 20 MG tablet TAKE 1 TABLET BY MOUTH THREE TIMES A DAY 270 tablet 1   ELIQUIS  5 MG TABS tablet Take 1 tablet (5 mg total) by mouth 2 (two) times daily. Take by mouth. 60 tablet 5   empagliflozin  (JARDIANCE ) 10 MG TABS tablet Take 1 tablet (10 mg total) by mouth daily before breakfast. 30 tablet 11   ezetimibe  (ZETIA ) 10 MG tablet TAKE 1 TABLET BY MOUTH EVERYDAY AT BEDTIME 90 tablet 3   finasteride  (PROSCAR ) 5 MG tablet TAKE 1 TABLET (5 MG TOTAL) BY MOUTH DAILY. 90 tablet 2   furosemide  (LASIX ) 20 MG tablet Take 40 mg by mouth daily.     hydrALAZINE  (APRESOLINE ) 50 MG  tablet Take 1 tablet (50 mg total) by mouth 3 (three) times daily. (Patient taking differently: Take 75 mg by mouth 3 (three) times daily.) 270 tablet 3   ipratropium-albuterol  (DUONEB) 0.5-2.5 (3) MG/3ML SOLN TAKE 3 MLS BY NEBULIZATION EVERY 2 (TWO) HOURS AS NEEDED (WHEEZE, SHORTNESS OF BREATH). 90 mL 3   loratadine  (CLARITIN ) 10 MG tablet Take 10 mg by mouth in the  morning.     Misc. Devices (PULSE OXIMETER) MISC Monitor oxygen  three times per day. 1 each 0   modafinil  (PROVIGIL ) 200 MG tablet Take 1 tablet (200 mg total) by mouth daily. 90 tablet 0   OXYGEN  Inhale 3-4 L into the lungs continuous.     pantoprazole  (PROTONIX ) 40 MG tablet TAKE 1 TABLET BY MOUTH TWICE A DAY 180 tablet 1   Phenylephrine -Acetaminophen  (TYLENOL  SINUS CONGESTION/PAIN PO) Take 1 tablet by mouth daily as needed (Congestion).     potassium chloride  (KLOR-CON  M) 10 MEQ tablet TAKE 1 TABLET BY MOUTH TWICE A DAY 180 tablet 1   prochlorperazine  (COMPAZINE ) 5 MG tablet Take 1 tablet (5 mg total) by mouth every 8 (eight) hours as needed for nausea or vomiting. 30 tablet 1   sacubitril -valsartan  (ENTRESTO ) 97-103 MG Take 1 tablet by mouth 2 (two) times daily. 60 tablet 2   sertraline  (ZOLOFT ) 100 MG tablet Take 2 tablets (200 mg total) by mouth daily. 180 tablet 0   sodium bicarbonate  650 MG tablet TAKE ONE TABLET BY MOUTH 4 TIMES DAILY 360 tablet 1   spironolactone  (ALDACTONE ) 25 MG tablet TAKE 1/2 TABLET BY MOUTH EVERY DAY 45 tablet 3   tiZANidine  (ZANAFLEX ) 4 MG capsule Take 1 capsule (4 mg total) by mouth 3 (three) times daily. 90 capsule 0   TOPROL  XL 50 MG 24 hr tablet Take 1 tablet (50 mg total) by mouth daily. 90 tablet 1   triamcinolone  (NASACORT ) 55 MCG/ACT AERO nasal inhaler Place 2-3 sprays into the nose daily as needed (allergies (when pollen is very high)).     No current facility-administered medications for this visit.     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    Past Surgical History:  Procedure Laterality Date   ANKLE FRACTURE SURGERY Left    APPENDECTOMY     BIOPSY  11/18/2021   Procedure: BIOPSY;  Surgeon: San Sandor GAILS, DO;  Location: [facility];  Service:  Gastroenterology;;  EGD and COLON   BIV ICD INSERTION CRT-D N/A 04/14/2023   Procedure: BIV ICD INSERTION CRT-D;  Surgeon: Inocencio Soyla Lunger, MD;  Location: [facility] INVASIVE CV LAB;  Service: Cardiovascular;  Laterality: N/A;   CERVICAL FUSION     CHOLECYSTECTOMY     COLON SURGERY     COLONOSCOPY WITH PROPOFOL  N/A 11/18/2021   Procedure: COLONOSCOPY WITH PROPOFOL ;  Surgeon: San Sandor GAILS, DO;  Location: [facility];  Service: Gastroenterology;  Laterality: N/A;   ESOPHAGOGASTRODUODENOSCOPY (EGD) WITH PROPOFOL  N/A 11/18/2021   Procedure: ESOPHAGOGASTRODUODENOSCOPY (EGD) WITH PROPOFOL ;  Surgeon: San Sandor GAILS, DO;  Location: [facility];  Service: Gastroenterology;  Laterality: N/A;   HERNIA REPAIR     POLYPECTOMY  11/18/2021   Procedure: POLYPECTOMY;  Surgeon: San Sandor GAILS, DO;  Location: [facility];  Service: Gastroenterology;;   RIGHT HEART CATH N/A 02/13/2022  Procedure: RIGHT HEART CATH;  Surgeon: Verlin Lonni BIRCH, MD;  Location: [facility] INVASIVE CV LAB;  Service: Cardiovascular;  Laterality: N/A;    Social History   Socioeconomic History   Marital status: Married    Spouse name: Dawn   Number of children: 1   Years of education: 14   Highest education level: Some college, no degree  Occupational History   Occupation: Disabled  Tobacco Use   Smoking status: Former    Current packs/day: 0.00    Average packs/day: 1 pack/day for 28.0 years (28.0 ttl pk-yrs)    Types: Cigarettes    Start date: 21    Quit date: 2010    Years since quitting: 15.7   Smokeless tobacco: Never  Vaping Use   Vaping status: Never Used  Substance and Sexual Activity   Alcohol use: No   Drug use: No    Comment: 04/06/2018 Patient's wife wanted us  to be very aware that her husband is a recovering narcotic abuser   Sexual activity: Yes  Other Topics Concern   Not on file  Social History Narrative   Lives with his wife, daughter, son-in-law and 2 two grandchildren. Likes to spend to  time with grandchildren. Does not exercise due to back issues in the past and respiratory problems.   Social Drivers of Corporate investment banker Strain: Low Risk  (02/04/2022)   Overall Financial Resource Strain (CARDIA)    Difficulty of Paying Living Expenses: Not hard at all  Food Insecurity: Low Risk  (03/18/2024)   Received from [facility]   Hunger Vital Sign    Within the past 12 months, you worried that your food would run out before you got money to buy more: Never true    Within the past 12 months, the food you bought just didn't last and you didn't have money to get more. : Never true  Transportation Needs: No Transportation Needs (03/18/2024)   Received from [facility]    In the past 12 months, has lack of reliable transportation kept you from medical appointments, meetings, work or from getting things needed for daily living? : No  Physical Activity: Inactive (02/04/2022)   Exercise Vital Sign    Days of Exercise per Week: 0 days    Minutes of Exercise per Session: 0 min  Stress: No Stress Concern Present (02/04/2022)   [employer] of Occupational Health - Occupational Stress Questionnaire    Feeling of Stress : Not at all  Social Connections: Moderately Integrated (02/04/2022)   Social Connection and Isolation Panel    Frequency of Communication with Friends and Family: Once a week    Frequency of Social Gatherings with Friends and Family: More than three times a week    Attends Religious Services: More than 4 times per year    Active Member of [employer] or Organizations: No    Attends Banker Meetings: Never    Marital Status: Married  Catering manager Violence: Not At Risk (02/04/2022)   Humiliation, Afraid, Rape, and Kick questionnaire    Fear of Current or Ex-Partner: No    Emotionally Abused: No    Physically Abused: No    Sexually Abused: No    Family History  Problem Relation Age of Onset   Cancer Mother        breast    Depression Mother    Hyperlipidemia Mother    Dementia Mother    Alcohol abuse Father    Cancer  Father        skin   Hyperlipidemia Father    Alzheimer's disease Father    Cancer Maternal Grandfather    Cancer Paternal Grandmother    Pancreatic cancer Paternal Uncle    Colon cancer Neg Hx    Esophageal cancer Neg Hx    Stomach cancer Neg Hx     ROS: no fevers or chills, productive cough, hemoptysis, dysphasia, odynophagia, melena, hematochezia, dysuria, hematuria, rash, seizure activity, orthopnea, PND, pedal edema, claudication. Remaining systems are negative.  Physical Exam: Well-developed well-nourished in no acute distress.  Skin is warm and dry.  HEENT is normal.  Neck is supple.  Chest is clear to auscultation with normal expansion.  Cardiovascular exam is regular rate and rhythm.  Abdominal exam nontender or distended. No masses palpated. Extremities show no edema. neuro grossly intact  ECG- personally reviewed  A/P  1 dyspnea-felt secondary to progressive interstitial lung disease.  2 coronary artery disease-continue aspirin .  Intolerant to statins.  3 chronic systolic congestive heart failure-patient is euvolemic on examination today.  Continue Entresto , Jardiance , spironolactone  and Toprol .    4 hyperlipidemia-continue Zetia .  Did not tolerate Repatha  or statins.  5 hypertension-patient's blood pressure is controlled.  Continue present medical regimen.  6 ICD-per EP.  7 paroxysmal atrial fibrillation-patient was placed on amiodarone  in [employer].  I do not think this is an ideal choice given his interstitial lung disease.  Will discontinue.  If he has recurrences and would consider treating with Tikosyn.  Continue Toprol  and apixaban .  8 obstructive sleep apnea-continue CPAP.  9 interstitial lung disease-managed by pulmonary.  Redell Shallow, MD

## 2024-06-09 NOTE — Telephone Encounter (Signed)
  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     PLAN  1) Who is his sleep doc? > he will have to ask them to do the change  2)Justification  CEFERINO LANG  was set up for an ABG done on below date and the results showed showed chronic hypercarbia   DEONTRAY HUNNICUTT has  chronic respiratory failure with OSA/OHV and chronic systolic CHF. That  is life threatening.  Previous ABG's have documented high PCO2 Patient WILL benefit from non-invasive ventilation.  Without this therapy, the patient is at high risk of ending up with worsening symptoms, worsened respiratory failure, need for ER visits and/or recurrent hospitalizations.  Bilevel device unable to adequately support patient's nocturnal ventilation needs.  Patient would benefit from NIV therapy with set tidal volumes and pressure.   3) Can also see Dr Theodoro - there are some newer sleep apnea Rx     SIGNATURE    Dr. Dorethia Cave, M.D., F.C.C.P,  Pulmonary and Critical Care Medicine Staff Physician, [facility] Center Director - Interstitial Lung Disease  Program  Pulmonary Fibrosis [facility] at [facility] [place], [place], [address]   Pager: [phone], If no answer  -> Check AMION or Try [phone] Telephone (clinical office): [phone] Telephone (research): [phone]  9:54 AM 06/09/2024      SIGNATURE    Dr. Dorethia Cave, M.D., F.C.C.P,  Pulmonary and Critical Care Medicine Staff Physician, [facility] Center Director - Interstitial Lung Disease  Program  Pulmonary Fibrosis [facility] at [facility] [place], [place], [address]   Pager: [phone], If no answer  -> Check  AMION or Try [phone] Telephone (clinical office): [phone] Telephone (research): [phone]  9:52 AM 06/09/2024

## 2024-06-17 ENCOUNTER — Other Ambulatory Visit: Payer: Self-pay | Admitting: Physician Assistant

## 2024-06-17 DIAGNOSIS — K580 Irritable bowel syndrome with diarrhea: Secondary | ICD-10-CM

## 2024-06-17 DIAGNOSIS — G4733 Obstructive sleep apnea (adult) (pediatric): Secondary | ICD-10-CM

## 2024-06-17 DIAGNOSIS — F39 Unspecified mood [affective] disorder: Secondary | ICD-10-CM

## 2024-06-17 DIAGNOSIS — F411 Generalized anxiety disorder: Secondary | ICD-10-CM

## 2024-06-19 ENCOUNTER — Other Ambulatory Visit: Payer: Self-pay | Admitting: Physician Assistant

## 2024-06-19 DIAGNOSIS — G4733 Obstructive sleep apnea (adult) (pediatric): Secondary | ICD-10-CM

## 2024-06-19 MED ORDER — MODAFINIL 200 MG PO TABS: 200.0000 mg | ORAL_TABLET | Freq: Every day | ORAL | 0 refills | Status: DC

## 2024-06-19 MED ORDER — HYDRALAZINE HCL 25 MG PO TABS: 75.0000 mg | ORAL_TABLET | Freq: Three times a day (TID) | ORAL | 5 refills | Status: DC

## 2024-06-20 ENCOUNTER — Ambulatory Visit (INDEPENDENT_AMBULATORY_CARE_PROVIDER_SITE_OTHER): Admitting: Physician Assistant

## 2024-06-20 VITALS — BP 144/77 | HR 77 | Ht 71.0 in | Wt 233.0 lb

## 2024-06-20 DIAGNOSIS — J849 Interstitial pulmonary disease, unspecified: Secondary | ICD-10-CM

## 2024-06-20 DIAGNOSIS — T7840XD Allergy, unspecified, subsequent encounter: Secondary | ICD-10-CM

## 2024-06-20 DIAGNOSIS — J309 Allergic rhinitis, unspecified: Secondary | ICD-10-CM | POA: Diagnosis not present

## 2024-06-20 DIAGNOSIS — J9611 Chronic respiratory failure with hypoxia: Secondary | ICD-10-CM | POA: Diagnosis not present

## 2024-06-20 MED ORDER — METHYLPREDNISOLONE SODIUM SUCC 125 MG IJ SOLR
125.0000 mg | Freq: Once | INTRAMUSCULAR | Status: AC
  Administered 2024-06-20: 125 mg via INTRAMUSCULAR

## 2024-06-21 ENCOUNTER — Encounter: Payer: Self-pay | Admitting: Physician Assistant

## 2024-06-21 ENCOUNTER — Ambulatory Visit: Admitting: Cardiology

## 2024-06-21 DIAGNOSIS — J309 Allergic rhinitis, unspecified: Secondary | ICD-10-CM | POA: Insufficient documentation

## 2024-06-21 NOTE — Progress Notes (Signed)
   Acute Office Visit  Subjective:     Patient ID: Jake [name], male    DOB: March 05, 1961, 63 y.o.   MRN: [id number]  HPI Pt is a 63 yo male with ILD with chronic respiratory failure who presents to the clinic with allergy exacerbation today. He has known allergies and does not tolerate oral steroids. He is having lots of sinus pressure, ear popping, nasal and sinus congestion. No fever, chills, body aches. His O2 is staying at 4-5L continuous. He is using tylenol  cold sinus severe frequently with mucinex in the morning. He takes claritin  and nasocort daily.    ROS See HPI.     Objective:    BP (!) 144/77   Pulse 77   Ht 5' 11 (1.803 m)   Wt 233 lb (105.7 kg)   SpO2 91%   BMI 32.50 kg/m  BP Readings from Last 3 Encounters:  06/20/24 (!) 144/77  06/02/24 136/72  05/17/24 (!) 160/80   Wt Readings from Last 3 Encounters:  06/20/24 233 lb (105.7 kg)  06/02/24 233 lb (105.7 kg)  05/17/24 236 lb (107 kg)      Physical Exam Constitutional:      Appearance: Normal appearance. He is obese.  HENT:     Head: Normocephalic.     Nose: Congestion present. No rhinorrhea.     Mouth/Throat:     Mouth: Mucous membranes are moist.  Eyes:     Comments: Injected conjunctiva with watery eye discharge  Cardiovascular:     Rate and Rhythm: Normal rate and regular rhythm.  Pulmonary:     Effort: Pulmonary effort is normal.     Breath sounds: Normal breath sounds.  Musculoskeletal:     Right lower leg: No edema.     Left lower leg: No edema.  Neurological:     General: No focal deficit present.     Mental Status: He is alert and oriented to person, place, and time.  Psychiatric:        Mood and Affect: Mood normal.          Assessment & Plan:  SABRASABRADiagnoses and all orders for this visit:  Allergic sinusitis -     methylPREDNISolone  sodium succinate  (SOLU-MEDROL ) 125 mg/2 mL injection 125 mg  ILD (interstitial lung disease) (HCC)  Chronic respiratory failure with hypoxia  (HCC)   Solumedrol given IM in office today Vitals stable, pulse ox at 91 percent.  Continue symptomatic allergy support Consider adding sinus rinses to allergy regimen Continue O2 use Follow up as needed if symptoms persist or worsen   Vermell Bologna, PA-C

## 2024-06-26 ENCOUNTER — Other Ambulatory Visit: Payer: Self-pay | Admitting: Cardiology

## 2024-06-27 DIAGNOSIS — J849 Interstitial pulmonary disease, unspecified: Secondary | ICD-10-CM | POA: Diagnosis not present

## 2024-07-07 ENCOUNTER — Telehealth: Payer: Self-pay

## 2024-07-07 MED ORDER — LORAZEPAM 0.5 MG PO TABS: 0.5000 mg | ORAL_TABLET | Freq: Three times a day (TID) | ORAL | 2 refills | Status: DC | PRN

## 2024-07-07 NOTE — Telephone Encounter (Signed)
 Med not showing in current med list

## 2024-07-07 NOTE — Telephone Encounter (Signed)
 End stage lung disease. Hospice called for order to start ativan due to air hunger with ativan .5mg  TID. Sent to pharmacy.

## 2024-07-07 NOTE — Telephone Encounter (Signed)
 Copied from CRM [id number]. Topic: Clinical - Medication Question >> Jul 07, 2024  4:09 PM Sophia H wrote: Reason for CRM: States PCP is waiting on message from [facility]. [facility] states that they are needing to get a new RX for Lorazapam 0.5MG  every 8 hours as needed for anxiety or shortness of breath. States patient spoke with PCP about this already and all she was waiting on was the recommendation from the nurse with hospice. # [phone] ERIN    [id number] - [place], [place] - [address]

## 2024-07-07 NOTE — Telephone Encounter (Signed)
 Attempted call to [facility]- unable to leave a message - after office hours.

## 2024-07-10 ENCOUNTER — Encounter: Payer: Self-pay | Admitting: Cardiology

## 2024-07-10 ENCOUNTER — Encounter: Payer: Self-pay | Admitting: Internal Medicine

## 2024-07-10 DIAGNOSIS — G4730 Sleep apnea, unspecified: Secondary | ICD-10-CM

## 2024-07-10 DIAGNOSIS — G478 Other sleep disorders: Secondary | ICD-10-CM

## 2024-07-11 NOTE — Telephone Encounter (Signed)
 Called patient to discuss MyChart message. Patient reports he has swelling in his wrist and stomach, no worsening SOB that he would contribute to fluid.   Offered patient appointment to see APP tomorrow for evaluation. Patient declined. He is asking what fluid restriction should he be following.  Clinical summary from [facility]: Assessment & Plan (03/19/2024 9:00 AM EDT):   Formatting of this note might be different from the original. The patient has evidence of chronic heart failure without evidence of an acute exacerbation. Will monitor intake and output closely and treat as indicated. --EF on echo 2024 25% --patient takes torsemide  20mg  outpatient cardiology started lasix  40mg  oral daily --continue home entresto  , carvedilol --fluid restriction , 2gm salt diet --AICD in place --daily weights   Instructed patient to follow 1.5 liter daily fluid restriction. He reports he has been drinking more than this and will limit fluids. He has appt with Dr. Pietro on 08/16/24.

## 2024-07-12 NOTE — Telephone Encounter (Signed)
 So I sent a phone note 06/09/24. Did you all receive it? I never got a reply back. Please advise. Also in any event please refer to sleep doc; I did order.  Please give appt to see sleep doc  Sending to both clinical and admin

## 2024-07-12 NOTE — Telephone Encounter (Signed)
 Called patient. He would like to wait and talk with Dr.Ramsawamy at his next appointment to see if it is necessary for him to see a sleep doctor since he is already on cpap.

## 2024-07-13 NOTE — Telephone Encounter (Signed)
 Spoke with Rocky - informed that Lorazepam had been sent to pharmacy on 07/07/2024. She states this has been helping him - She states his breathing does continue to worsen and  That their NP is going out to see him In a few weeks for re-eval to see if he is at the Hospice stage now.

## 2024-07-14 ENCOUNTER — Ambulatory Visit (INDEPENDENT_AMBULATORY_CARE_PROVIDER_SITE_OTHER): Payer: Medicare HMO

## 2024-07-14 DIAGNOSIS — I420 Dilated cardiomyopathy: Secondary | ICD-10-CM

## 2024-07-16 LAB — CUP PACEART REMOTE DEVICE CHECK
Battery Remaining Longevity: 99 mo
Battery Voltage: 3.01 V
Brady Statistic RV Percent Paced: 1.51 %
Date Time Interrogation Session: 20251107012844
HighPow Impedance: 59 Ohm
Lead Channel Impedance Value: 285 Ohm
Lead Channel Impedance Value: 342 Ohm
Lead Channel Impedance Value: 380 Ohm
Lead Channel Impedance Value: 399 Ohm
Lead Channel Impedance Value: 418 Ohm
Lead Channel Impedance Value: 513 Ohm
Lead Channel Impedance Value: 513 Ohm
Lead Channel Impedance Value: 627 Ohm
Lead Channel Impedance Value: 779 Ohm
Lead Channel Impedance Value: 798 Ohm
Lead Channel Impedance Value: 817 Ohm
Lead Channel Impedance Value: 836 Ohm
Lead Channel Impedance Value: 893 Ohm
Lead Channel Pacing Threshold Amplitude: 0.5 V
Lead Channel Pacing Threshold Amplitude: 0.75 V
Lead Channel Pacing Threshold Amplitude: 1.75 V
Lead Channel Pacing Threshold Pulse Width: 0.4 ms
Lead Channel Pacing Threshold Pulse Width: 0.4 ms
Lead Channel Pacing Threshold Pulse Width: 0.8 ms
Lead Channel Sensing Intrinsic Amplitude: 14.1 mV
Lead Channel Sensing Intrinsic Amplitude: 2.9 mV
Lead Channel Setting Pacing Amplitude: 1 V
Lead Channel Setting Pacing Amplitude: 1.5 V
Lead Channel Setting Pacing Amplitude: 2.25 V
Lead Channel Setting Pacing Pulse Width: 0.4 ms
Lead Channel Setting Pacing Pulse Width: 0.8 ms
Lead Channel Setting Sensing Sensitivity: 0.3 mV
Zone Setting Status: 755011
Zone Setting Status: 755011
Zone Setting Status: 755011

## 2024-07-18 ENCOUNTER — Ambulatory Visit: Payer: Self-pay | Admitting: Cardiology

## 2024-07-18 NOTE — Progress Notes (Signed)
Remote ICD

## 2024-07-18 NOTE — Addendum Note (Signed)
 Addended by: VICCI SELLER A on: 07/18/2024 12:31 PM   Modules accepted: Orders

## 2024-07-20 NOTE — Progress Notes (Signed)
 Jake [name]                                          MRN: [id number]   07/20/2024   The [facility]BCI Quality Team Specialist reviewed this patient medical record for the purposes of chart review for care gap closure. The following were reviewed: chart review for care gap closure-controlling blood pressure.    VBCI Quality Team

## 2024-07-25 NOTE — Progress Notes (Unsigned)
 OV 02/03/2022 -transfer of care to Dr. Geronimo at the ILD clinic by Dr. MALVA  Subjective:  Patient ID: Jake [name], male , DOB: 1961-02-27 , age 63 y.o. , MRN: [id number] , ADDRESS: [address] [place] [address] PCP Antoniette Vermell CROME, PA-C Patient Care Team: Antoniette Vermell CROME DEVONNA as PCP - General (Family Medicine) Tobb, Kardie, DO as PCP - Cardiology (Cardiology) Savannah Hastings, PA-C as Referring Physician (Pulmonary Disease) Beverli Cara PARAS, [facility] as Pharmacist (Pharmacist)  This Provider for this visit: Treatment Team:  Attending Provider: Geronimo Amel, MD    02/03/2022 -   Chief Complaint  Patient presents with   Follow-up    Pt switching from Dr. Neda to MR for ILD eval. Pt states lately he has had problems of SOB that can happen at any time and also has a chronic cough.     HPI [name] 63 y.o. -history is provided by the wife, patient and also reviewed the medical records and the ILD questionnaire.  He has had insidious onset of shortness of breath for the last few years.  Chart review it appears that in 2020 when he got his respiratory infection [in retrospect he thinks this was COVID before the formal declaration of the pandemic] and since then he has been having progressive shortness of breath.  Especially over the last year or so his progression is worse..  For the last 1 year has been on oxygen  2 L.  He feels he haseven progressed more in the last few months.  And is now needing oxygen  4 L at rest at all the time.  Sometimes it is 3 L.  Wife says he easily desaturates into the 58s and occasionally into the 78s when he exerts.  When he does the bed or showers or does chores increases to 4 L.  He feels a portable oxygen  is not helping him anymore.  Particularly more progressive in the last few months.  Recently was tried on prednisone .  He is not sure if it helped but it made him very angry had irate emotional outburst and he could not take  prednisone  anymore.  Through all this late last year in 2022 he was off his Entresto  for heart failure.  This made the shortness of breath worse but then he went back on it in January and he got better but nevertheless overall progressive dyspnea.     [place] [facility] Comprehensive ILD Questionnaire  Symptoms:     Past Medical History :  Status post CABG in 2004.  He also had cardiac stents approximately 6-12 months ago according to his history with 1 repair of the stent.  Still it did not help the shortness of breath.  He has had COVID at least 3 times.  The first 1 was in January 2020 right before the onset of the pandemic.  The second 1 was in January 2020 and the third was in December 2022 during small bowel obstruction admission.  This was an incidental finding according to the wife  He has chronic systolic heart failure  Other issues include nausea, irritable bowel syndrome, acid reflux back and neck pain  He has obstructive sleep apnea  He has a history of pneumonia several years ago  ROS:  -Positive for fatigue for the last several months, arthralgia for the last several years, dysphagia for the last several decades, dry eyes for the last several months, nausea for the last several years, acid reflux  for the last several years also snoring for the last several years  FAMILY HISTORY of LUNG DISEASE:  Negative for any lung disease  PERSONAL EXPOSURE HISTORY:  -Did smoke in the remote past and quit -Smoked between 1985 and 2008 1 and half packs per day. -Possible cocaine use between 1991 and marijuana use between 1978 and 2008 : Very little to none   HOME  EXPOSURE and HOBBY DETAILS :  -20 years ago he lived in a house that had black mold he lived there for few years but has not been exposed to that for 20 years.  -Was using a Phillips responding CPAP machine for a few years.  It was subject to recall because of foam silicone  contamination issues that caused  respiratory complications.  He stopped using this in 2021 shortly after the recall was issued.  -He is now living in his daughter's house for the last 3 months.  -The old house did have mold.  It also had mildew in the shower curtain. 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  Restrictive pattern with mild desaturations.  -Echocardiogram March 2022 ` -Ejection fraction 30-35%, grade 1 diastolic dysfunction  - BNP May 2023  - 31  - RHC June 2023  -  [place] [facility]   Echo 02/12/22: EF 40-45%, mild MR. LA moderately dilated. RV size and function normal. GRade 1 DD. RHC 02/12/22: RA- 5 mmHg; PA 25/12 mmHg, mean PAP 17 mmHg, Wedge 8 mmHg, CO (fick) 6.59 L/mim, CI-2.96 L/min meter2.   03/12/2022: Today - follow up Patient presents today with wife after being started on Ofev. He is having significant nausea with his first dose of Ofev. He wakes up in the morning, takes his zofran  and eats a small snack. He then takes his Ofev at 8 am with food as well, and has tried multiple different food options. He then becomes extremely nauseated around 845 and has to go lay down until he is able to take his second dose of Zofran  around 1/2 pm. He finally begins to feel better and is able to tolerate his second dose. He is understandably  frustrated by this and does not wish to have this poor quality of life from his medications. He denies any vomiting or diarrhea. He has had some weight gain as he has had to eat so often with the Ofev to try to curve his nausea.   From a respiratory standpoint, he continues to get winded with minimal exertion. He can be changing the bed at home or walk 50 ft to the mailbox and has to sit down afterwards to rest and catch his breath. He has also had drops in his oxygen  into the 70's-80's despite his supplemental 4 lpm so he has been minimally active because of this. No significant cough or wheezing. He has not noticed any difference  in his activity tolerance or lung function since starting the Ofev, but has felt so terrible that he's not sure if he would notice much.           OV 05/05/2022  Subjective:  Patient ID: Jake [name], male , DOB: [birth year] , age 22 y.o. , MRN: [id number] , ADDRESS: [address] [place] [address] PCP Antoniette Vermell CROME, PA-C Patient Care Team: Antoniette Vermell CROME DEVONNA as PCP - General (Family Medicine) Tobb, Kardie, DO as PCP - Cardiology (Cardiology) Savannah Hastings, PA-C as Referring Physician (Pulmonary Disease) Beverli Cara PARAS, [facility] as Pharmacist (Pharmacist)  This Provider for this visit: Treatment Team:  Attending Provider: Geronimo Amel, MD    05/05/2022 -   Chief Complaint  Patient presents with   Follow-up    Pt states he feels like his breathing has become worse since last visit. Also states he has had a dry cough, chest discomfort, and has been having GI issues due to the Esbriet .     HPI Atul Delucia 63 y.o. --presents for follow-up.  Presents with his wife.  He has non-- IPF progressive phenotype ILD.  Suspected etiology usage of CPAP machine [employer].  He tried nintedanib but he had significant intolerance.  He is currently on fourth week of pirfenidone  at full dose for the last 1 week.  He is having significant  diarrhea nausea and fatigue because of this.  At 2 pills 3 times daily it was more tolerable.  He is willing to go back to 2 pills 3 times daily and try.  He also significant anxiety and depression.  He is on Zoloft .  In the past he has tried SNRI or some other agent according to the wife but he had side effects.  I have recommended they talk with the primary care physician about this.  He is also trying to lose weight.  He has been prescribed Wegovy  but there is no stock available.  He has been looked at [facility] for lung transplant but given his low EF he has been disqualified and they do not want to see him.  He did have a right heart catheterization in June 2023 and this was normal.  According to him after some delay from our office he has been referred to Marcey Lot at [facility] for lung transplant evaluation.  But he has not heard back from them.  I personally emailed Dr. Fritzi office.  Also he tells me that with 4 L nasal cannula at rest he is fine but when he does any laundry he desaturates into the 80s.  He has tried increasing to 5 L nasal cannula.  I advised that he could increase it further and keep his pulse ox over 86% and monitor it.  He is willing to do that.  They are not interested in immunomodulators right now because of young kids in the house.    Overall he and his wife are worried that he is declining.  He did have pulmonary function test today and?  Slight decline compared to May 2023.SABRA  He had liver function test monitoring blood work yesterday but so far the results are not back yet.   SUMMARY VISIT Dr Lot 06/09/22 at [facility]   He also agrees that patient does not have IPF.  CT scan is not consistent with UIP.  Currently pirfenidone  is causing too many side effects.  He wants to stop it.  Because there is more suggestion of valvulitis he  wants to start CellCept.  He is going to start the patient on 500 mg twice daily today.  Patient seeing me in approximately a  month from now.  At the time I will increase to a total of 1 g twice daily.   He also wants the patient referred to pulmonary rehabilitation.   In terms of lung transplantation a lot of relative contraindications including weight and mild cardiomyopathy and intolerance to steroids.  At this point in time he wants patient to be more active and get into rehab  Of note patient only desaturated to 88% on room air and that walk test today   Plan  - Get patient in for CBC chemistry and liver function test in 2-3 weeks from today  -Refer to pulmonary rehabilitation   OV 07/20/2022  Subjective:  Patient ID: Jake [name], male , DOB: 09/11/1960 , age 27 y.o. , MRN: [id number] , ADDRESS: [address] [place] [address] PCP Antoniette Vermell CROME, PA-C Patient Care Team: Antoniette Vermell CROME DEVONNA as PCP - General (Family Medicine) Tobb, Kardie, DO as PCP - Cardiology (Cardiology) Savannah Hastings, PA-C as Referring Physician (Pulmonary Disease) Beverli Cara PARAS, [facility] as Pharmacist (Pharmacist)  This Provider for this visit: Treatment Team:  Attending Provider: Geronimo Amel, MD    07/20/2022 -   Chief Complaint  Patient presents with   Follow-up    Pt states he has been doing okay since last visit. Has not worn his O2 in about a month and states that he has been walking about 0.45mile a day.     HPI Haydon Dorris 63 y.o. -returns for follow-up with his wife.  Since his last visit he has lost over 10 pounds because of Ozempic.  He started his Ozempic.  Then approximately a month ago he did see Dr. Garnette Lot at [facility] .  At the time his exercise hypoxemia improved.  He started beginning to feel better as well.  According to the wife and him a day prior to that he was still feeling miserable but on the day of the visit he started noticing a turnaround and since then he has had sustained turnaround.  He was having side effects from pirfenidone  and this was stopped  early October 2023.  Dr. Lot recommended mycophenolate mofetil [patient mistakenly thought this was metoprolol  at this visit but later confirmed that he has been on metoprolol  for a long time to Dr. Pietro for his heart failure].  Since then he is having sustained improvement in dyspnea.  Also he was able to go to [employer] and walk on flat ground for a long time without desaturating.  He still desaturates but it is only for heavy exertion such as vacuuming or lifting heavy load.  He will go into the low 80s and then rest to recover.  He does not use his oxygen  for his hypoxemia.  Instead he opted to rest.  At this point in time he is telling me because of his improvement he does not want to do his mycophenolate havery is going to confirm it was indeed mycophenolate and not metoprolol ].  I referred him to pulm rehabilitation but this was expensive.  He is opting to exercise by himself and continue weight loss with Ozempic.  Also because of his improvement he is asking if he can do a pulmonary function test.  His walking desaturation test is improved.  He also his pulmonary function test shows improvement although he still worse compared to  June of last year.  His last echocardiogram was in the summer 2023 and he still had low ejection fraction.  His BNP at that time was normal.  In terms of lung transplant Dr. Rankin is told him it is too high risk given his medication side effects and heart failure and obesity.  At this point in time he is reconciled to the fact that he will not be a transplant candidate.  However he is overall pleased that he is improved.         OV 10/29/2022  Subjective:  Patient ID: Jake [name], male , DOB: Sep 02, 1961 , age 37 y.o. , MRN: [id number] , ADDRESS: [address] [place] [address] PCP Antoniette Vermell CROME, PA-C Patient Care Team: Antoniette Vermell CROME DEVONNA as PCP - General (Family Medicine) Tobb, Kardie, DO as PCP - Cardiology  (Cardiology) Savannah Hastings, PA-C as Referring Physician (Pulmonary Disease) Beverli Cara PARAS, [facility] as Pharmacist (Pharmacist)  This Provider for this visit: Treatment Team:  Attending Provider: Geronimo Amel, MD    10/29/2022 -   Chief Complaint  Patient presents with   Follow-up    Spiro review,     HPI Jake [name] 62 y.o. - Returns for follow-up with his wife.  Overall he reports being stable.  Weight is stable.  He is no longer on antifibrotic's because of intolerance.  His ejection fraction has dropped to 30% and he is frustrated by that.  Wife states that he does desaturate when he exerts and then he rests and the oxygen  goes up.  She would prefer for him to wear the oxygen  preemptively.  I encouraged him to do so because that is a better strategy.  At this point in time he states that he had a rough flu season he had stomach flu twice.  He believes he might have once in 2 or 3 other respiratory infections again denies any mold or mildew or bird feathers in the house he does do housework but has to wear oxygen  for that he can go shopping without oxygen  but wife suspects he might be desaturating.  We did discuss clinical trials as a care option.  Presented to have inhaled pirfenidone  trial that is yet to start and also inhaled treprostinil trial that is ongoing.  They are interested in this.  Discussed the following care concept about clinical trials.  Will reassess for this and second quarter or third quarter 2024.  He did have a CT scan of the chest Jan 2024 I reviewed the result.  ILD is described but there is no other abnormality described.  Since CT scan with contrast so we cannot compare the CT scans.     OV 01/05/2023  Subjective:  Patient ID: Jake [name], male , DOB: May 14, 1961 , age 35 y.o. , MRN: [id number] , ADDRESS: [address] [place] [address] PCP Antoniette Vermell CROME, PA-C Patient Care Team: Antoniette Vermell CROME DEVONNA as PCP - General (Family  Medicine) Pietro Redell RAMAN, MD as PCP - Cardiology (Cardiology) Savannah Hastings, PA-C as Referring Physician (Pulmonary Disease) Beverli Cara PARAS, [facility] as Pharmacist (Pharmacist)  This Provider for this visit: Treatment Team:  Attending Provider: Geronimo Amel, MD    01/05/2023 -   Chief Complaint  Patient presents with   Follow-up    F/up after heart attack    HPI Spyridon Hornstein 63 y.o. - presents with wife. On approx 11/16/22 felt unweasy with chest burn. Admitted at [facility] and dx with MI with  trop leak. Had DES. Dsichargd on Brilintia and metoprolol  but both gav him side efffects of dyspnea, anxiety, arthralgia. So both stopped and now on plavix  and repatha . Toleraitng it ok but for mild fatigues. He came in today to updated me. Resp wise he feels stable. Can hold pulse ox > 88% at rest but with exertun desaturates. Wife is heer with him and is indeoendenbt historian - says he is not promot about wearing his o2. Currenty dealing with allergies. Used wifes; Azihro Wondered if I could fill his azithro but his baseline 2024 and 2023 EKG bith show QTc > 500 msec       OV 02/16/2023  Subjective:  Patient ID: Jake [name], male , DOB: [birth year] , age 29 y.o. , MRN: [id number] , ADDRESS: [address] [place] [address] PCP Antoniette Vermell CROME, PA-C Patient Care Team: Antoniette Vermell CROME DEVONNA as PCP - General (Family Medicine) Pietro Redell RAMAN, MD as PCP - Cardiology (Cardiology) Savannah Hastings, PA-C as Referring Physician (Pulmonary Disease) Beverli Cara PARAS, [facility] as Pharmacist (Pharmacist)  This Provider for this visit: Treatment Team:  Attending Provider: Geronimo Amel, MD  02/16/2023 -   Chief Complaint  Patient presents with   Acute Visit    Cough, chest tightness, increasing sob when not on oxygen .     HPI Jake [name] 63 y.o. - ACUTE VISIT3L.  Presents with his wife Nayquan Evinger.  She is an independent historian.  She corroborates everything that he is saying.   Around February 10, 2023 Wednesday started having slightly more cough.  Then on February 12, 2023 Friday he went to an outdoor concert in [place].  They started coughing more.  Then after he came back he started feeling and this is since February 13, 2023 tired with chest tightness coughing.  Also for the last 1 week his oxygen  is dropping.  Previously could take a shower without desaturating now he drops into the 70s.  He says his pulse ox at rest is in the 80s.  Currently pulse ox is 98% on 3 L at rest.  There is no edema but there is orthopnea.  There is no hemoptysis.  There is no wheezing no fever no diarrhea.  Has baseline nausea and it has not changed.  He feels his chest is extremely tight.  There might be some wheezing 2.  The cough is dry but 1 time he did have 1 nickel sized mucus but that is just the 1 time in many days.  He is really worried about his condition.  He does not feel it similar to his previous heart attack.  His wife is wondering about getting a CT chest last CT chest was a year ago.   He does not want steroids.  Especially oral.  He says he is fine with IM steroids.      OV 05/13/2023  Subjective:  Patient ID: Jake KANDICE [name], male , DOB: Jan 10, 1961 , age 26 y.o. , MRN: [id number] , ADDRESS: [address] [place] [address] PCP Antoniette Vermell CROME, PA-C Patient Care Team: Antoniette Vermell CROME DEVONNA as PCP - General (Family Medicine) Pietro Redell RAMAN, MD as PCP - Cardiology (Cardiology) Inocencio Soyla Lunger, MD as PCP - Electrophysiology (Cardiology) Savannah Hastings, PA-C as Referring Physician (Pulmonary Disease) Beverli Cara PARAS, [facility] as Pharmacist (Pharmacist)  This Provider for this visit: Treatment Team:  Attending Provider: Geronimo Amel, MD   05/13/2023 -   Chief Complaint  Patient presents with   Follow-up  O2 2-3 l       HPI BRAYON BIELEFELD 63 y.o. -presents for follow-up.  Presents with his wife who is an independent historian.  He tells me that the IM  Depo-Medrol  I gave him helped him.  He cannot do oral prednisone  because it changes his mood.  He states his mood gets so bad that even I would not like to know him.  He feels that after initial improvement from the acute flareup he started feeling worse again.  He feels he is chronically getting worse on 04/14/2023 he had ICD and pacemaker implantation but despite that he is getting worse.  Symptom score shows significant decline.  Concomitant with that high-resolution CT chest that I reviewed visualized and showed it to him and his wife shows worsening ILD.  Pulmonary function test also shows a decline.  Although the sit/stand exercise hypoxemia test appears to be the same.  He reports it is getting very difficult for him to go to the bathroom.  We had conversations about his life expectancy which could still be few to several years with a lot of variability.  We discussed treatment options.  He cannot tolerate the antifibrotic's pirfenidone  or nintedanib.  He does not want to do oral prednisone .  We discussed CellCept but he is worried about immunosuppression.  He lives with his grandkids and he adores them.  He says they come with respiratory infections therefore he does not want to do this at this point he wants to do IM Depo-Medrol  on a scheduled basis.  I have reached out to our pharmacy team he is also willing to try IV Solu-Medrol .  We discussed clinical trials as a care option.  He is already in the ILD-Pro registry.  He will have a research visit today.  There is inhaled pirfenidone  and treprostinil studies.  We will prescreen this    CT Chest data from date: HRCT 02/25/23  - personally visualized and independently interpreted : yes - my findings are: as below Narrative & Impression  CLINICAL DATA:  63 year old male with history of prior myocardial infarction. Evaluate for interstitial lung disease.   EXAM: CT CHEST WITHOUT CONTRAST   TECHNIQUE: Multidetector CT imaging of the chest was  performed following the standard protocol without intravenous contrast. High resolution imaging of the lungs, as well as inspiratory and expiratory imaging, was performed.   RADIATION DOSE REDUCTION: This exam was performed according to the departmental dose-optimization program which includes automated exposure control, adjustment of the mA and/or kV according to patient size and/or use of iterative reconstruction technique.   COMPARISON:  Multiple priors, including prior high-resolution chest CT 01/16/2022.   FINDINGS: Cardiovascular: Heart size is normal. There is no significant pericardial fluid, thickening or pericardial calcification. There is aortic atherosclerosis, as well as atherosclerosis of the great vessels of the mediastinum and the coronary arteries, including calcified atherosclerotic plaque in the left anterior descending and right coronary arteries. Calcifications of the aortic valve.   Mediastinum/Nodes: No pathologically enlarged mediastinal or hilar lymph nodes. Please note that accurate exclusion of hilar adenopathy is limited on noncontrast CT scans. Esophagus is unremarkable in appearance. No axillary lymphadenopathy.   Lungs/Pleura: High-resolution images again demonstrate patchy areas of ground-glass attenuation, septal thickening, subpleural reticulation, mild cylindrical bronchiectasis and peripheral bronchiolectasis scattered throughout the lungs bilaterally. There is no discernible craniocaudal gradient to these findings. No frank honeycombing. Findings appear minimally progressive compared to the prior study from 01/16/2022. Inspiratory and expiratory imaging demonstrates mild air trapping  indicative of small airways disease. No acute consolidative airspace disease. No pleural effusions. No definite suspicious appearing pulmonary nodules or masses are noted.   Upper Abdomen: Atherosclerotic calcifications in the abdominal aorta. Status post  cholecystectomy.   Musculoskeletal: Orthopedic fixation hardware in the lower cervical spine incidentally noted. There are no aggressive appearing lytic or blastic lesions noted in the visualized portions of the skeleton.   IMPRESSION: 1. The appearance of the lungs remains compatible with interstitial lung disease, with a spectrum of findings considered most compatible with an alternative diagnosis (not usual interstitial pneumonia) per current ATS guidelines. 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 2. Aortic atherosclerosis, in addition to two-vessel coronary artery disease. Please note that although the presence of coronary artery calcium  documents the presence of coronary artery disease, the severity of this disease and any potential stenosis cannot be assessed on this non-gated CT examination. Assessment for potential risk factor modification, dietary therapy or pharmacologic therapy may be warranted, if clinically indicated. 3. There are calcifications of the aortic valve. Echocardiographic correlation for evaluation of potential valvular dysfunction may be warranted if clinically indicated.   Aortic Atherosclerosis (ICD10-I70.0).     Electronically Signed   By: Toribio Aye M.D.   On: 03/01/2023 12:31     OV 07/15/2023  Subjective:  Patient ID: Jake KANDICE [name], male , DOB: 1960-10-30 , age 36 y.o. , MRN: [id number] , ADDRESS: [address] [place] [address] PCP Antoniette Vermell CROME, PA-C Patient Care Team: Antoniette Vermell CROME DEVONNA as PCP - General (Family Medicine) Pietro Redell RAMAN, MD as PCP - Cardiology (Cardiology) Inocencio Soyla Lunger, MD as PCP - Electrophysiology (Cardiology) Savannah Hastings, PA-C as Referring Physician (Pulmonary Disease) Beverli Cara PARAS, [facility] as Pharmacist (Pharmacist)  This Provider for this visit: Treatment Team:  Attending Provider:  Geronimo Amel, MD      07/15/2023 -   Chief Complaint  Patient presents with   Follow-up    Pt states steroid shot has not been scheduled. Pt states breathing still the same      HPI LADARREN STEINER 63 y.o. -presents for follow-up.  Presents with his wife.  She is an independent historian.  Last visit we decided to try IM Depo-Medrol  on a weekly basis to try to control inflammation as long because his progressive phenotype and his ILD.  He cannot tolerate antifibrotic's.  However we run into logistical issues.  It took a while to figure out the right dosing and the schedule with the pharmacy team.  But right now we have shortage of support staff.  Therefore we resolved today that I would reach out to his primary care physician assistant to see if it can be done there.  That office is close to his home anyways.  I did advise him that it may not be exactly on the same day every week it can be plus a -2 days.  Nonetheless from a symptom standpoint he is doing stable although early in the morning he does feel more short of breath when he exerts he is more short of breath he is using his oxygen  day and night.  Right now at rest it is 88% on room air.  He did get IM Depo-Medrol  for a stomach virus a few weeks ago and it did help him according to his history.  Of note I did notice that he was tachycardic today at rest and review of the  records indicate that he said some send tachycardia event at the last visit.  I did mention to him the role of ivabradine and inappropriate sinus tachycardia and heart failure patients.  Did say that 1 patient who got ivabradine in the setting of normal ejection fraction and inappropriate sinus tachycardia did feel better.  This patient had ILD.  I have reached out to Dr. Inocencio to see if this would be an appropriate therapy for the patient.        OV 11/03/2023  Subjective:  Patient ID: Jake KANDICE [name], male , DOB: 1960-11-20 , age 31 y.o. , MRN: [id number]  , ADDRESS: [address] [place] [address] PCP Antoniette Vermell CROME, PA-C Patient Care Team: Antoniette Vermell CROME DEVONNA as PCP - General (Family Medicine) Pietro Redell RAMAN, MD as PCP - Cardiology (Cardiology) Inocencio Soyla Lunger, MD as PCP - Electrophysiology (Cardiology) Savannah Hastings, PA-C as Referring Physician (Pulmonary Disease) Beverli Cara PARAS, [facility] (Inactive) as Pharmacist (Pharmacist)  This Provider for this visit: Treatment Team:  Attending Provider: Geronimo Amel, MD     11/03/2023 -   Chief Complaint  Patient presents with   Follow-up     HPI Jake KANDICE [name] 63 y.o. -presents with his wife.  Overall they feel he is getting worse.  Shortness of breath is getting worse.  He is now needing 4 L at rest and 6 L with exertion such as making a bed.  The weekly steroids which she has been taking for the last 2 months [on pause for the last 3 weeks ) is not helping.  Wife feels it is making worse.  He is more irritable.  They did have a cardiology visit recently.  He did have pulmonary function test his FVC shows decline his DLCO shows stability compared to the fall 2024 but compared to a year ago both are declined.  Most recent echo was in the summer 2024 with low ejection fraction.  They have talked about life expectancy.  I did express to them it could be few years.  Could also be a few months and is variable.  They wanted a letter for financial reasons so I asked the CMA to print 1 out.  They not ready for home palliative care he has good support at home from his wife.  They are interested in knowing the status of the fibrosis.  We aligned to check a D-dimer first and if its normal go to a high-res CT chest.     OV 11/26/2023  Subjective:  Patient ID: Jake KANDICE [name], male , DOB: 05-18-1961 , age 81 y.o. , MRN: [id number] , ADDRESS: [address] [place] [address] PCP Antoniette Vermell CROME, PA-C Patient Care Team: Antoniette Vermell CROME DEVONNA as PCP - General  (Family Medicine) Pietro Redell RAMAN, MD as PCP - Cardiology (Cardiology) Inocencio Soyla Lunger, MD as PCP - Electrophysiology (Cardiology) Savannah Hastings, PA-C as Referring Physician (Pulmonary Disease) Beverli Cara PARAS, [facility] (Inactive) as Pharmacist (Pharmacist)     Type of visit: Video Virtual Visit Identification of patient TALLIN HART with 1961-02-28 and MRN [id number] - 2 person identifier Risks: Risks, benefits, limitations of telephone visit explained. Patient understood and verbalized agreement to proceed Anyone else on call: hiw wife Patient location: his car This provider location: [address]; [place]; [place] [address]. [facility]. [phone]   S:   This Provider for this visit: Treatment Team:  Attending Provider: Geronimo Amel, MD  11/26/2023 -presents with his wife for this video visit.  He is sitting in his car.  He tells me that with 4 L of oxygen  he was getting cramps after increasing to 5 L at rest the cramps are reduced.  He feels maybe he was hypoxemic at night.  He still has significant class III dyspnea on exertion.  I rule out pulmonary embolism late February 2025 and then we went to an got high-resolution CT chest.  The fibrotic densities are unchanged compared to summer 2024 but is clear progression between now and 2021, between now and 2022 and between now and 2023.  He is off his steroids.  I did indicate to him the steroids probably did not help him.  At this point even though his progressive phenotype he is stable in the last 9 months.  In terms of his class III dyspnea I did raise the possibility that his systolic heart failure EF 25% is playing a role.  I have asked him to visit with Dr. Pietro.  I believe he is on maximal medical therapy.  I did indicate to him to discuss about heart failure services and if that would be beneficial for him.   HPI DUGLAS HEIER 63 y.o. - CT Chest data from date: 11/09/23  -  personally visualized and independently interpreted : yes - my findings are: Mentioned above in discussion. arrative & Impression  CLINICAL DATA:  Diffuse/interstitial lung disease. Insidious onset of shortness of breath since COVID.   EXAM: CT CHEST WITHOUT CONTRAST   TECHNIQUE: Multidetector CT imaging of the chest was performed following the standard protocol without intravenous contrast. High resolution imaging of the lungs, as well as inspiratory and expiratory imaging, was performed.   RADIATION DOSE REDUCTION: This exam was performed according to the departmental dose-optimization program which includes automated exposure control, adjustment of the mA and/or kV according to patient size and/or use of iterative reconstruction technique.   COMPARISON:  11/05/2023, 02/25/2023, 01/16/2022 and 11/07/2020.   FINDINGS: Cardiovascular: Atherosclerotic calcification of the aorta and aortic valve with age advanced involvement of the coronary arteries. Ascending aorta measures up to approximately 3.9 cm (coronal image 102). Enlarged pulmonic trunk and heart. No pericardial effusion.   Mediastinum/Nodes: No pathologically enlarged mediastinal or axillary lymph nodes. Hilar regions are difficult to definitively evaluate without IV contrast. Esophagus is grossly unremarkable.   Lungs/Pleura: Coarsened interstitial and subpleural ground-glass with traction bronchiectasis/bronchiolectasis and subpleural reticular densities. Findings appear similar to 02/25/2023. Calcified granulomas. No pleural fluid. Airway is unremarkable. There is air trapping.   Upper Abdomen: Visualized portions of the liver, right adrenal gland, spleen, pancreas, stomach and bowel are grossly unremarkable. Left hemidiaphragm is mildly elevated.   Musculoskeletal: Degenerative changes in the spine.   IMPRESSION: 1. Pulmonary parenchymal pattern of interstitial lung disease, as described above, similar to  02/25/2023. 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 Findings are suggestive of an alternative diagnosis (not UIP) per consensus guidelines: Diagnosis of Idiopathic Pulmonary Fibrosis: An Official ATS/ERS/JRS/ALAT Clinical Practice Guideline. Am JINNY Honey Crit Care Med Vol 198, Iss 5, [phone], May 08 2017. 2. Age advanced coronary artery calcification. 3.  Aortic atherosclerosis (ICD10-I70.0).     Electronically Signed   By: Newell Eke M.D.   On: 11/22/2023 11:13    OV 05/02/2024  Subjective:  Patient ID: Jake KANDICE [name], male , DOB: 10-02-1960 , age 63 y.o. , MRN: [id number] , ADDRESS: [address] [place]  [address] PCP Antoniette Vermell CROME, PA-C Patient Care Team: Breeback, Jade L, PA-C as PCP - General (Family Medicine) Pietro Redell RAMAN, MD as PCP - Cardiology (Cardiology) Inocencio Soyla Lunger, MD as PCP - Electrophysiology (Cardiology) Savannah Hastings, [place] as Referring Physician (Pulmonary Disease) Beverli Cara PARAS, [facility] (Inactive) as Pharmacist (Pharmacist)  This Provider for this visit: Treatment Team:  Attending Provider: Geronimo Amel, MD    05/02/2024 -   Chief Complaint  Patient presents with   Medical Management of Chronic Issues   Interstitial Lung Disease    Breathing has been progressively worsening since the last visit.      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 Biopsy being avoided due to risk Noted severe prednisone  intolerance, Esbriet  intolerance and Ofev intolerance On supportive care Trying weekly Depo-Medrol  since late 2024.->  Stopped spring 2025  CT scans  - 2021 -> perhaps some early groundglass opacities -> 2022: Increased groundglass opacities with some early reticulation  -> 2023 scattered groundglass opacities of different pattern > 2024: More fibrotic appearance in the bilateral upper lobe anterior segment and bilateral lower lobe> stable appearance as of March 2025   Associated chronic systolic heart failrue  - ef 35% currently - declined by [facility] lung transplant team - summer 2023 - not a candidate for lung transplatn per Katherene Staple - oct 2023 - s/p MI needing stent march 2024 at [facility]  -Status post ICD and pacemaker implantation 04/14/2023.  Echo June 2024 with EF 25%  Prolonged QTc   #Obstructive sleep apnea on CPAP  #Hospitalization 03/18/2024 - 03/28/2024  - New onset atrial fibrillation -on amiodarone  in the hospital but not at discharge - Acute hypoxemic and hypercapnic respiratory failure requiring BiPAP [in the setting of crackles]  HPI AMARE BAIL 63 y.o. -returns for follow-up.  Presents with his wife.  Since I last saw him he got hospitalized as above in July 2025 for new onset atrial fibrillation.  It is not exactly clear why but he did end up with acute hypoxemic and hypercapnic respiratory failure.  ABG reviewed his pCO2 was in the 70s [[facility] here 2 years ago had pCO2 in the low 50s].  He says he nearly got intubated.  They treated him with BiPAP.  Appears he might have  f gotten steroids.  But I am not sure.  He did get amiodarone  but not at discharge.  He is on Eliquis .  He was discharged on 7 L oxygen .  He now says he is improved to 4 L at baseline 6-7 L at exertion.  There is no edema.  He is back to his baseline symptom score.       OV 07/26/2024  Subjective:  Patient ID: Jake KANDICE [name], male , DOB: Feb 06, 1961 , age 53 y.o. , MRN: [id number] , ADDRESS: [address] [place] [address] PCP Antoniette Vermell CROME, PA-C Patient Care Team: Antoniette Vermell CROME DEVONNA as PCP - General (Family Medicine) Pietro Redell RAMAN, MD as PCP - Cardiology (Cardiology) Inocencio Soyla Lunger, MD as PCP - Electrophysiology  (Cardiology) Savannah Hastings, PA-C as Referring Physician (Pulmonary Disease) Beverli Cara PARAS, [facility] (Inactive) as Pharmacist (Pharmacist)  This Provider for this visit: Treatment Team:  Attending Provider: Geronimo Amel, MD    07/26/2024 -   Chief Complaint  Patient presents with   Interstitial  Lung Disease    Pt states since LOV breathing hasn't been great, 02 levels are dropping to 60s with little to no exertion, pt stated he has been having chest pains SOB occurs constantly w/ exertion Dry cough, deep very loud Pt is on 5L of 02     HPI DOVBER ERNEST 63 y.o. -ANDYN SALES is a 63 year old male with pulmonary fibrosis who presents with worsening hypoxemia and decreased quality of life.  He experiences significant difficulty maintaining adequate oxygen  saturation levels, particularly during exertion. His oxygen  saturation drops into the sixties with exertion, even while on five to six liters of oxygen . At rest, his saturation remains in the nineties on five liters. He experiences a rapid drop in oxygen  levels with minimal physical activity, such as walking across a room, which causes significant anxiety. He has been prescribed anxiety medication, but finds that increasing his oxygen  flow provides quicker relief than the medication.  He describes logistical challenges with his current oxygen  setup, as the concentrator is centrally located in the house, making it difficult to adjust the flow rate quickly when needed. He uses a portable oxygen  tank set to four liters, but it depletes quickly, limiting its utility. Spirometry testing over the past year has shown a decrease in lung function, with values dropping from 1.9 liters to 1.45 liters.  He has a history of atrial fibrillation and has been hospitalized since his last visit. During his hospital stay, his oxygen  was titrated from seven liters down to five liters. He has been evaluated for pulmonary hypertension, with a right  heart catheterization two years ago showing no evidence of the condition at that time. He is not a candidate for lung transplant due to his medical history and medication intolerances.  He experiences chest pain, which he attributes to scar tissue pulling. He is currently on palliative care and is considering transitioning to hospice care due to his declining quality of life and the progression of his disease.   Wife indy historian and says QUALY miserable  SYMPTOM SCALE - ILD 02/03/2022 07/20/2022 Off esbreit and ofev 10/29/22 Ef now 30-35% 05/13/2023 Ef 25$ 07/15/2023 Ef 25$ 11/03/2023 Taking Depo-Medrol  every week1 05/02/2024 Of the Depo-Medrol   Current weight 231# 224# - ozempic 229#      O2 use 4L [place] rest  Ra rest      Shortness of Breath 0 -> 5 scale with 5 being worst (score 6 If unable to do)        At rest 2 0 2 2 3 1 2   Simple tasks - showers, clothes change, eating, shaving 5 2 3 4 4 4 4   Household (dishes, doing bed, laundry) 5 4 5 5 5 4 4   Shopping 5 2 1 4 4 4 3   Walking level at own pace 4 3 2 4 4 2 3   Walking up Stairs 5 4 4 5 5 4 4   Total (30-36) Dyspnea Score 26 15 17 24 25 17 20       Non-dyspnea symptoms (0-> 5 scale) 02/03/2022 07/20/2022  10/29/2022  05/13/2023  07/15/2023  11/03/2023  05/02/2024   How bad is your cough? 4 3 1 4 4 3 2   How bad is your fatigue 5 2 3 5 5 4 4   How bad is nausea 4 5 4 4 4 3 3   How bad is vomiting?  0 0 0 0 0 0 1  How bad is diarrhea? 0 1 0 0 2 0 1  How bad is anxiety? 5 1 2 5 4 4 4   How bad is depression 5 1 2 2 4 4 4   Any chronic pain - if so where and how bad 3 x   x 0 0     Simple office walk 185 feet x  3 laps goal with forehead probe 07/20/2022  01/05/2023  02/16/2023  05/13/2023  07/15/2023  05/02/2024   O2 used ra ra 3L ra ra   Number laps completed 3 5 sit stand  5 sit stand rest   Comments about pace avg       Resting Pulse Ox/HR 99% and 85/min 94% 98% at rest 97% and HR 95 88% Room air res -, HR 108 4L - 99% at rest   Final Pulse Ox/HR 93% and 100/min 89%  88% befiefly a minute later    Desaturated </= 88% no       Desaturated <= 3% points Yes 6        Got Tachycardic >/= 90/min yes       Symptoms at end of test Mild dyspea       Miscellaneous comments improved         CT Chest data from date: mrch 2025  - personally visualized and independently interpreted : no - my findings are:  as below    IMPRESSION: 1. Pulmonary parenchymal pattern of interstitial lung disease, as described above, similar to 02/25/2023. 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 Findings are suggestive of an alternative diagnosis (not UIP) per consensus guidelines: Diagnosis of Idiopathic Pulmonary Fibrosis: An Official ATS/ERS/JRS/ALAT Clinical Practice Guideline. Am JINNY Honey Crit Care Med Vol 198, Iss 5, [phone], May 08 2017. 2. Age advanced coronary artery calcification. 3.  Aortic atherosclerosis (ICD10-I70.0).     Electronically Signed   By: Newell Eke M.D.   On: 11/22/2023 11:13    Latest Reference Range & Units 05/26/24 12:53  pH, Arterial 7.35 - 7.45  7.41  pCO2 arterial 32 - 48 mmHg 53 (H)  pO2, Arterial 83 - 108 mmHg 95  (H): Data is abnormally high PFT     Latest Ref Rng & Units 05/11/2023    2:56 PM 10/29/2022   12:49 PM 07/02/2022   11:35 AM 05/04/2022    3:32 PM 01/15/2022    3:58 PM 02/26/2021   12:00 PM  PFT Results  FVC-Pre L 1.93  1.73  2.20  1.87  1.91  2.44   FVC-Predicted Pre % 39  35  44  38  38  49   FVC-Post L     1.97  2.35   FVC-Predicted Post %     40  47   Pre FEV1/FVC % % 91  89  91  94  96  93   Post FEV1/FCV % %     90  91   FEV1-Pre L 1.75  1.55  2.01  1.77  1.83  2.27   FEV1-Predicted Pre % 47  42  54  47  49  60   FEV1-Post L     1.78  2.15   DLCO uncorrected ml/min/mmHg 8.40  10.50  12.27  11.35  14.05  16.66   DLCO UNC% % 29  37  43  40  49  58   DLCO corrected ml/min/mmHg  10.50  12.27  11.93  14.05   16.66   DLCO COR %Predicted %  37  43  42  49  58   DLVA Predicted % 67  94  86  92  105  101   TLC L 8.29     4.36  4.52   TLC % Predicted % 115     60  62   RV % Predicted % 272     60  66        LAB RESULTS last 96 hours No results found.       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   reports that he quit smoking about 15 years ago. His smoking use included cigarettes. He started smoking about 43 years ago. He has a 28 pack-year smoking history. He has never used smokeless tobacco.  Past Surgical History:  Procedure Laterality Date   ANKLE FRACTURE SURGERY Left    APPENDECTOMY     BIOPSY  11/18/2021   Procedure: BIOPSY;  Surgeon: San Sandor GAILS, DO;  Location: [facility];  Service: Gastroenterology;;  EGD and COLON   BIV ICD INSERTION CRT-D N/A 04/14/2023   Procedure: BIV ICD INSERTION CRT-D;  Surgeon: Inocencio Soyla Lunger, MD;  Location: [facility] INVASIVE CV LAB;  Service: Cardiovascular;  Laterality: N/A;   CERVICAL FUSION     CHOLECYSTECTOMY     COLON SURGERY     COLONOSCOPY WITH PROPOFOL  N/A 11/18/2021   Procedure: COLONOSCOPY WITH PROPOFOL ;  Surgeon: San Sandor GAILS, DO;  Location: [facility];  Service: Gastroenterology;  Laterality: N/A;   ESOPHAGOGASTRODUODENOSCOPY (EGD) WITH PROPOFOL  N/A 11/18/2021   Procedure: ESOPHAGOGASTRODUODENOSCOPY (EGD) WITH PROPOFOL ;  Surgeon: San Sandor GAILS, DO;  Location: [facility];  Service: Gastroenterology;  Laterality: N/A;   HERNIA REPAIR     POLYPECTOMY  11/18/2021   Procedure: POLYPECTOMY;  Surgeon: San Sandor GAILS, DO;  Location: [facility];  Service: Gastroenterology;;   RIGHT HEART CATH N/A 02/13/2022   Procedure: RIGHT HEART CATH;  Surgeon: Verlin Lonni BIRCH, MD;  [facility] INV[facility] CV LAB;  Service: Cardiovascular;  Laterality: N/A;    Allergies  Allergen  Reactions   Fentanyl  Other (See Comments)    Avoid opiates Other reaction(s): Other Avoid opiates   Other     04/06/18 Patient is a recovering narcotic abuser and, per wife, is NEVER to receive controlled substances (e.g. Narcotics, sedatives, etc.) Other reaction(s): Unknown 04/06/18 Patient is a recovering narcotic abuser and, per wife, is NEVER to receive controlled substances (e.g. Narcotics, sedatives, etc.)   Aspirin  Nausea And Vomiting   Lipitor [Atorvastatin] Nausea And Vomiting    GI side effects.     Prednisone  Other (See Comments)     Agitation Severe anger outbursts   Gabapentin     Excessive somnolence, unsteadiness, falls   Ofev [Nintedanib]     GI side effects   Pirfenidone     Nitroglycerin  Nausea And Vomiting    Can take with Nausea Meditcation    Immunization History  Administered Date(s) Administered   Influenza Inj Mdck Quad Pf 06/18/2022   Influenza Split 07/16/2014   Influenza, Seasonal, Injecte, Preservative Fre 08/30/2023   Influenza,inj,Quad PF,6+ Mos 07/16/2014, 07/21/2017, 08/22/2018, 06/13/2019, 06/10/2021   Influenza-Unspecified 07/16/2014, 06/14/2016   PFIZER Comirnaty(Gray Top)Covid-19 Tri-Sucrose Vaccine 06/18/2022   PFIZER(Purple Top)SARS-COV-2 Vaccination 11/21/2019, 12/24/2019, 06/23/2020   PNEUMOCOCCAL CONJUGATE-20 09/13/2023   Pfizer(Comirnaty)Fall Seasonal Vaccine 12 years and older 08/20/2023   Respiratory Syncytial Virus Vaccine ,Recomb Aduvanted(Arexvy ) 07/23/2022, 09/27/2023   Td 04/15/2015   Td (Adult), 2 Lf Tetanus Toxid,  Preservative Free 04/15/2015   Td (Adult),5 Lf Tetanus Toxid, Preservative Free 04/15/2015   Tdap 10/10/2009   Zoster Recombinant(Shingrix) 06/10/2021    Family History  Problem Relation Age of Onset   Cancer Mother        breast   Depression Mother    Hyperlipidemia Mother    Dementia Mother    Alcohol abuse Father    Cancer Father        skin   Hyperlipidemia Father    Alzheimer's disease Father     Cancer Maternal Grandfather    Cancer Paternal Grandmother    Pancreatic cancer Paternal Uncle    Colon cancer Neg Hx    Esophageal cancer Neg Hx    Stomach cancer Neg Hx      Current Outpatient Medications:    AMBULATORY NON FORMULARY MEDICATION, Continuous positive airway pressure (CPAP) machine set at autotitration of H2O pressure, with all supplemental supplies as needed., Disp: 1 each, Rfl: 0   amiodarone  (PACERONE ) 200 MG tablet, TAKE 1 TABLET BY MOUTH EVERY DAY, Disp: 90 tablet, Rfl: 0   aspirin  81 MG chewable tablet, Chew 81 mg by mouth daily., Disp: , Rfl:    busPIRone  (BUSPAR ) 15 MG tablet, TAKE 1 TABLET BY MOUTH 2 TIMES DAILY., Disp: 180 tablet, Rfl: 0   colestipol  (COLESTID ) 1 g tablet, TAKE 1 TABLET BY MOUTH EVERY DAY, Disp: 90 tablet, Rfl: 1   dicyclomine  (BENTYL ) 20 MG tablet, TAKE 1 TABLET BY MOUTH THREE TIMES A DAY, Disp: 270 tablet, Rfl: 0   ELIQUIS  5 MG TABS tablet, Take 1 tablet (5 mg total) by mouth 2 (two) times daily. Take by mouth., Disp: 60 tablet, Rfl: 5   empagliflozin  (JARDIANCE ) 10 MG TABS tablet, Take 1 tablet (10 mg total) by mouth daily before breakfast., Disp: 30 tablet, Rfl: 11   ezetimibe  (ZETIA ) 10 MG tablet, TAKE 1 TABLET BY MOUTH EVERYDAY AT BEDTIME, Disp: 90 tablet, Rfl: 3   finasteride  (PROSCAR ) 5 MG tablet, TAKE 1 TABLET (5 MG TOTAL) BY MOUTH DAILY., Disp: 90 tablet, Rfl: 2   furosemide  (LASIX ) 20 MG tablet, Take 40 mg by mouth daily., Disp: , Rfl:    hydrALAZINE  (APRESOLINE ) 25 MG tablet, Take 3 tablets (75 mg total) by mouth 3 (three) times daily., Disp: 270 tablet, Rfl: 5   ipratropium-albuterol  (DUONEB) 0.5-2.5 (3) MG/3ML SOLN, TAKE 3 MLS BY NEBULIZATION EVERY 2 (TWO) HOURS AS NEEDED (WHEEZE, SHORTNESS OF BREATH)., Disp: 90 mL, Rfl: 3   loratadine  (CLARITIN ) 10 MG tablet, Take 10 mg by mouth in the morning., Disp: , Rfl:    LORazepam (ATIVAN) 0.5 MG tablet, Take 1 tablet (0.5 mg total) by mouth every 8 (eight) hours as needed for anxiety., Disp: 90  tablet, Rfl: 2   Misc. Devices (PULSE OXIMETER) MISC, Monitor oxygen  three times per day., Disp: 1 each, Rfl: 0   modafinil  (PROVIGIL ) 200 MG tablet, Take 1 tablet (200 mg total) by mouth daily., Disp: 90 tablet, Rfl: 0   OXYGEN , Inhale 3-4 L into the lungs continuous., Disp: , Rfl:    pantoprazole  (PROTONIX ) 40 MG tablet, TAKE 1 TABLET BY MOUTH TWICE A DAY, Disp: 180 tablet, Rfl: 1   Phenylephrine -Acetaminophen  (TYLENOL  SINUS CONGESTION/PAIN PO), Take 1 tablet by mouth daily as needed (Congestion)., Disp: , Rfl:    potassium chloride  (KLOR-CON  M) 10 MEQ tablet, TAKE 1 TABLET BY MOUTH TWICE A DAY, Disp: 180 tablet, Rfl: 1   prochlorperazine  (COMPAZINE ) 5 MG tablet, Take 1 tablet (5 mg total)  by mouth every 8 (eight) hours as needed for nausea or vomiting., Disp: 30 tablet, Rfl: 1   sacubitril -valsartan  (ENTRESTO ) 97-103 MG, TAKE 1 TABLET BY MOUTH TWICE A DAY, Disp: 180 tablet, Rfl: 1   sertraline  (ZOLOFT ) 100 MG tablet, TAKE 2 TABLETS BY MOUTH EVERY DAY, Disp: 180 tablet, Rfl: 0   sodium bicarbonate  650 MG tablet, TAKE ONE TABLET BY MOUTH 4 TIMES DAILY, Disp: 360 tablet, Rfl: 1   spironolactone  (ALDACTONE ) 25 MG tablet, TAKE 1/2 TABLET BY MOUTH EVERY DAY, Disp: 45 tablet, Rfl: 3   triamcinolone  (NASACORT ) 55 MCG/ACT AERO nasal inhaler, Place 2-3 sprays into the nose daily as needed (allergies (when pollen is very high))., Disp: , Rfl:    albuterol  (VENTOLIN  HFA) 108 (90 Base) MCG/ACT inhaler, INHALE 2 PUFFS BY MOUTH EVERY 6 HOURS AS NEEDED (Patient not taking: Reported on 07/26/2024), Disp: 18 each, Rfl: 3   cetirizine (ZYRTEC) 10 MG tablet, Take 10 mg by mouth daily. (Patient not taking: Reported on 07/26/2024), Disp: , Rfl:    tiZANidine  (ZANAFLEX ) 4 MG capsule, Take 1 capsule (4 mg total) by mouth 3 (three) times daily. (Patient not taking: Reported on 07/26/2024), Disp: 90 capsule, Rfl: 0   TOPROL  XL 50 MG 24 hr tablet, Take 1 tablet (50 mg total) by mouth daily. (Patient not taking: Reported on  07/26/2024), Disp: 90 tablet, Rfl: 1      Objective:   Vitals:   07/26/24 0913  BP: 112/73  Pulse: 81  Temp: 98.2 F (36.8 C)  TempSrc: Oral  SpO2: 96%  Weight: 230 lb (104.3 kg)  Height: 5' 11 (1.803 m)    Estimated body mass index is 32.08 kg/m as calculated from the following:   Height as of this encounter: 5' 11 (1.803 m).   Weight as of this encounter: 230 lb (104.3 kg).  @WEIGHTCHANGE @  Filed Weights   07/26/24 0913  Weight: 230 lb (104.3 kg)     Physical Exam   General: No distress. Looks same O2 at rest: yes [place]ford present: no Sitting in wheel chair: no Frail: no Obese: no Neuro: Alert and Oriented x 3. GCS 15. Speech normal Psych: Pleasant Resp:  Barrel Chest - no.  Wheeze - no, Crackles - no/mild at base, No overt respiratory distress CVS: Normal heart sounds. Murmurs - no Ext: Stigmata of Connective Tissue Disease - no HEENT: Normal upper airway. PEERL +. No post nasal drip        Assessment/     Assessment & Plan ILD (interstitial lung disease) (HCC)  Progressive fibrosing interstitial lung disease (HCC)  Chronic respiratory failure with hypoxia and hypercapnia (HCC)  Goals of care, counseling/discussion    PLAN Patient Instructions     ICD-10-CM   1. ILD (interstitial lung disease) (HCC)  J84.9     2. Progressive fibrosing interstitial lung disease (HCC)  J84.89     3. Chronic respiratory failure with hypoxia and hypercapnia (HCC)  J96.11    J96.12     4. Goals of care, counseling/discussion  Z71.89       Progressive disease with 20% decline in lung capacity SEvere hypoxemia - 5L at rest  Plan - respect no interest in RHC to rule out pulmonary hypertension and no interest in Boring drug or clinical trials  - refer hospice  - Support DNR BEDFORD and MOST formm  - Dyspnea and o2 suppport from hospice - look at ways to get more o2 when you walk   Folllowup  - video  visit in 2-3 months     FOLLOWUP    Return for  -  video visit in 2-3 months.    SIGNATURE    Dr. Dorethia Cave, M.D., F.C.C.P,  Pulmonary and Critical Care Medicine Staff Physician, [facility] Director - Interstitial Lung Disease  Program  Pulmonary Fibrosis [facility] at [facility] [place], [place], [address]  Pager: [phone], If no answer or between  15:00h - 7:00h: call [phone] Telephone: [phone]  9:45 AM 07/26/2024

## 2024-07-25 NOTE — Patient Instructions (Signed)
 ICD-10-CM   1. Hospital discharge follow-up  Z09     2. Chronic respiratory failure with hypoxia (HCC)  J96.11     3. ILD (interstitial lung disease) (HCC)  J84.9     4. OSA on CPAP  G47.33     5. Chronic systolic congestive heart failure, NYHA class 3 (HCC)  I50.22        ABG in July 2025 hospitalization suggests new/ worsening CO2 retention    Plan - shared decision making - CHeck ABG next few weeks to few days   - if still CO2 high, need to change cPAP to BiPAP - Do spirometry and DLCO in 3 months  - depending on results likely might qualify for new anti-fibrotic NERANDROMILAST - cotninue o2  4L rest and 6-7 L with exertion   Followup 3 months visit but after spirometry and DLCO; 30 min visit

## 2024-07-26 ENCOUNTER — Ambulatory Visit: Admitting: Internal Medicine

## 2024-07-26 ENCOUNTER — Encounter: Payer: Self-pay | Admitting: Internal Medicine

## 2024-07-26 ENCOUNTER — Ambulatory Visit (INDEPENDENT_AMBULATORY_CARE_PROVIDER_SITE_OTHER): Admitting: *Deleted

## 2024-07-26 VITALS — BP 112/73 | HR 81 | Temp 98.2°F | Ht 71.0 in | Wt 230.0 lb

## 2024-07-26 DIAGNOSIS — J9611 Chronic respiratory failure with hypoxia: Secondary | ICD-10-CM | POA: Diagnosis not present

## 2024-07-26 DIAGNOSIS — J8489 Other specified interstitial pulmonary diseases: Secondary | ICD-10-CM

## 2024-07-26 DIAGNOSIS — J9612 Chronic respiratory failure with hypercapnia: Secondary | ICD-10-CM

## 2024-07-26 DIAGNOSIS — I5022 Chronic systolic (congestive) heart failure: Secondary | ICD-10-CM

## 2024-07-26 DIAGNOSIS — J849 Interstitial pulmonary disease, unspecified: Secondary | ICD-10-CM

## 2024-07-26 DIAGNOSIS — Z7189 Other specified counseling: Secondary | ICD-10-CM | POA: Diagnosis not present

## 2024-07-26 NOTE — Progress Notes (Signed)
 Spirometry and diffusion capacity performed today.

## 2024-07-26 NOTE — Patient Instructions (Signed)
 Spirometry and diffusion capacity performed today.

## 2024-07-28 DIAGNOSIS — I5022 Chronic systolic (congestive) heart failure: Secondary | ICD-10-CM | POA: Diagnosis not present

## 2024-07-28 DIAGNOSIS — Z9981 Dependence on supplemental oxygen: Secondary | ICD-10-CM | POA: Diagnosis not present

## 2024-07-28 DIAGNOSIS — I110 Hypertensive heart disease with heart failure: Secondary | ICD-10-CM | POA: Diagnosis not present

## 2024-07-28 DIAGNOSIS — J849 Interstitial pulmonary disease, unspecified: Secondary | ICD-10-CM | POA: Diagnosis not present

## 2024-07-28 DIAGNOSIS — Z95810 Presence of automatic (implantable) cardiac defibrillator: Secondary | ICD-10-CM | POA: Diagnosis not present

## 2024-08-03 LAB — PULMONARY FUNCTION TEST
DL/VA % pred: 90 %
DL/VA: 3.78 ml/min/mmHg/L
DLCO cor % pred: 33 %
DLCO cor: 9.38 ml/min/mmHg
DLCO unc % pred: 33 %
DLCO unc: 9.38 ml/min/mmHg
FEF 25-75 Pre: 2.24 L/s
FEF2575-%Pred-Pre: 76 %
FEV1-%Pred-Pre: 35 %
FEV1-Pre: 1.29 L
FEV1FVC-%Pred-Pre: 116 %
FEV6-%Pred-Pre: 32 %
FEV6-Pre: 1.48 L
FEV6FVC-%Pred-Pre: 105 %
FVC-%Pred-Pre: 30 %
FVC-Pre: 1.48 L
Pre FEV1/FVC ratio: 87 %
Pre FEV6/FVC Ratio: 100 %

## 2024-08-07 ENCOUNTER — Telehealth: Payer: Self-pay | Admitting: Cardiology

## 2024-08-07 NOTE — Telephone Encounter (Signed)
 Pt calling about having his defibrillator turned off. Please advise.

## 2024-08-07 NOTE — Telephone Encounter (Signed)
 Reviewed the patient's chart. He has been following with [facility] for progressing interstitial lung disease and has reported decrease in quality of life.   CRT-D implanted for Chronic CHF.   I called and spoke with the patient and confirmed that he wants his ICD turned off.  Advised the patient this if the defibrillator portion of his device is turned off, it will still continue to pace his heart as needed.   Inquired if he is currently on pallative care or hospice care. Per Mr. Staszak, he is now on hospice care.   Advised the patient I would forward to Dr. Inocencio for review as we cannot turn off his ICD without an order. The patient lives near the [facility] office and is due for is yearly follow up with EP.  He is aware that I will see if we can work him into the [employer] location on 08/21/24 with Dr. Inocencio. I will call back once I can review with Dr. Inocencio.   The patient voices understanding and is agreeable.

## 2024-08-07 NOTE — Telephone Encounter (Signed)
 Reviewed with Dr. Deno- ok to double book on 08/21/24 in the [facility] office to discuss turning of therapies.   I have notified the patient of the above and he is agreeable. Appt scheduled for Monday 08/21/24 at 1:45 pm. Location confirmed with the patient- he voices understanding and is agreeable. He was very appreciative of the call back.

## 2024-08-09 NOTE — Progress Notes (Deleted)
 HPI: Follow-up CAD.  CPX March 2022 showed submaximal effort, moderate functional impairment due to pulmonary restriction.  High resolution chest CT March 2022 showed interstitial lung disease and aortic atherosclerosis; enlarged pulmonary trunk suggestive of pulmonary hypertension.  Cardiac catheterization March 2022 showed normal left main, minimal irregularities in the LAD, 2 small diagonals, circumflex, 2 obtuse marginals, posterior lateral, PDA and nondominant right coronary artery; ejection fraction 30 to 35% with mild left ventricular enlargement. Echocardiogram June 2023 showed ejection fraction 40 to 45%, grade 1 diastolic dysfunction, moderate left atrial enlargement, mild mitral regurgitation.  Right heart catheterization June 2023 showed cardiac output 6.59 L/min, cardiac index 2.96 L/min, RA pressure 11, PA pressure 25/12 and pulmonary capillary wedge pressure 8. CTA January 2024 showed pneumonitis and atheromatous changes of the thoracic aorta but no aneurysm.     Patient admitted to [facility] March 2024 with STEMI.  Cardiac catheterization revealed 95 to 99% mid circumflex, 30% LAD, 50% PDA.  Patient had PCI of the left circumflex with drug-eluting stent.  Echocardiogram repeated February 23, 2023 and showed ejection fraction 25%, hypokinesis of the anterolateral and inferoseptal walls, severe left ventricular enlargement, mild left ventricular hypertrophy, grade 1 diastolic dysfunction, mild left atrial enlargement, mild mitral regurgitation and mildly dilated aortic root at 42 mm.  High-resolution chest CT June 2024 showed interstitial lung disease but no aneurysm.  Had biventricular ICD placed August 2024.  Carotid Dopplers February 2025 showed 40 to 59% right and 1 to 39% left stenosis.  CTA February 2025 showed no pulmonary embolus.  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  Patient admitted at [facility] July 2025 with atrial  fibrillation.  Treated with Cardizem and converted to sinus rhythm.  Patient was also placed on amiodarone .  Since last seen  Current Outpatient Medications  Medication Sig Dispense Refill   albuterol  (VENTOLIN  HFA) 108 (90 Base) MCG/ACT inhaler INHALE 2 PUFFS BY MOUTH EVERY 6 HOURS AS NEEDED (Patient not taking: Reported on 07/26/2024) 18 each 3   AMBULATORY NON FORMULARY MEDICATION Continuous positive airway pressure (CPAP) machine set at autotitration of H2O pressure, with all supplemental supplies as needed. 1 each 0   amiodarone  (PACERONE ) 200 MG tablet TAKE 1 TABLET BY MOUTH EVERY DAY 90 tablet 0   aspirin  81 MG chewable tablet Chew 81 mg by mouth daily.     busPIRone  (BUSPAR ) 15 MG tablet TAKE 1 TABLET BY MOUTH 2 TIMES DAILY. 180 tablet 0   cetirizine (ZYRTEC) 10 MG tablet Take 10 mg by mouth daily. (Patient not taking: Reported on 07/26/2024)     colestipol  (COLESTID ) 1 g tablet TAKE 1 TABLET BY MOUTH EVERY DAY 90 tablet 1   dicyclomine  (BENTYL ) 20 MG tablet TAKE 1 TABLET BY MOUTH THREE TIMES A DAY 270 tablet 0   ELIQUIS  5 MG TABS tablet Take 1 tablet (5 mg total) by mouth 2 (two) times daily. Take by mouth. 60 tablet 5   empagliflozin  (JARDIANCE ) 10 MG TABS tablet Take 1 tablet (10 mg total) by mouth daily before breakfast. 30 tablet 11   ezetimibe  (ZETIA ) 10 MG tablet TAKE 1 TABLET BY MOUTH EVERYDAY AT BEDTIME 90 tablet 3   finasteride  (PROSCAR ) 5 MG tablet TAKE 1 TABLET (5 MG TOTAL) BY MOUTH DAILY. 90 tablet 2   furosemide  (LASIX ) 20 MG tablet Take 40 mg by mouth daily.     hydrALAZINE  (APRESOLINE ) 25 MG tablet Take 3 tablets (75 mg total) by mouth 3 (three)  times daily. 270 tablet 5   ipratropium-albuterol  (DUONEB) 0.5-2.5 (3) MG/3ML SOLN TAKE 3 MLS BY NEBULIZATION EVERY 2 (TWO) HOURS AS NEEDED (WHEEZE, SHORTNESS OF BREATH). 90 mL 3   loratadine  (CLARITIN ) 10 MG tablet Take 10 mg by mouth in the morning.     LORazepam  (ATIVAN ) 0.5 MG tablet Take 1 tablet (0.5 mg total) by mouth every  8 (eight) hours as needed for anxiety. 90 tablet 2   Misc. Devices (PULSE OXIMETER) MISC Monitor oxygen  three times per day. 1 each 0   modafinil  (PROVIGIL ) 200 MG tablet Take 1 tablet (200 mg total) by mouth daily. 90 tablet 0   OXYGEN  Inhale 3-4 L into the lungs continuous.     pantoprazole  (PROTONIX ) 40 MG tablet TAKE 1 TABLET BY MOUTH TWICE A DAY 180 tablet 1   Phenylephrine -Acetaminophen  (TYLENOL  SINUS CONGESTION/PAIN PO) Take 1 tablet by mouth daily as needed (Congestion).     potassium chloride  (KLOR-CON  M) 10 MEQ tablet TAKE 1 TABLET BY MOUTH TWICE A DAY 180 tablet 1   prochlorperazine  (COMPAZINE ) 5 MG tablet Take 1 tablet (5 mg total) by mouth every 8 (eight) hours as needed for nausea or vomiting. 30 tablet 1   sacubitril -valsartan  (ENTRESTO ) 97-103 MG TAKE 1 TABLET BY MOUTH TWICE A DAY 180 tablet 1   sertraline  (ZOLOFT ) 100 MG tablet TAKE 2 TABLETS BY MOUTH EVERY DAY 180 tablet 0   sodium bicarbonate  650 MG tablet TAKE ONE TABLET BY MOUTH 4 TIMES DAILY 360 tablet 1   spironolactone  (ALDACTONE ) 25 MG tablet TAKE 1/2 TABLET BY MOUTH EVERY DAY 45 tablet 3   tiZANidine  (ZANAFLEX ) 4 MG capsule Take 1 capsule (4 mg total) by mouth 3 (three) times daily. (Patient not taking: Reported on 07/26/2024) 90 capsule 0   TOPROL  XL 50 MG 24 hr tablet Take 1 tablet (50 mg total) by mouth daily. (Patient not taking: Reported on 07/26/2024) 90 tablet 1   triamcinolone  (NASACORT ) 55 MCG/ACT AERO nasal inhaler Place 2-3 sprays into the nose daily as needed (allergies (when pollen is very high)).     No current facility-administered medications for this visit.     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    Past Surgical History:  Procedure Laterality Date   ANKLE FRACTURE SURGERY Left    APPENDECTOMY      BIOPSY  11/18/2021   Procedure: BIOPSY;  Surgeon: San Sandor GAILS, DO;  Location: [facility];  Service: Gastroenterology;;  EGD and COLON   BIV ICD INSERTION CRT-D N/A 04/14/2023   Procedure: BIV ICD INSERTION CRT-D;  Surgeon: Inocencio Soyla Lunger, MD;  Location: [facility] INVASIVE CV LAB;  Service: Cardiovascular;  Laterality: N/A;   CERVICAL FUSION     CHOLECYSTECTOMY     COLON SURGERY     COLONOSCOPY WITH PROPOFOL  N/A 11/18/2021   Procedure: COLONOSCOPY WITH PROPOFOL ;  Surgeon: San Sandor GAILS, DO;  Location: [facility];  Service: Gastroenterology;  Laterality: N/A;   ESOPHAGOGASTRODUODENOSCOPY (EGD) WITH PROPOFOL  N/A 11/18/2021   Procedure: ESOPHAGOGASTRODUODENOSCOPY (EGD) WITH PROPOFOL ;  Surgeon: San Sandor GAILS, DO;  Location: [facility];  Service: Gastroenterology;  Laterality: N/A;   HERNIA REPAIR     POLYPECTOMY  11/18/2021   Procedure: POLYPECTOMY;  Surgeon: San Sandor GAILS, DO;  Location: [facility];  Service: Gastroenterology;;   RIGHT HEART CATH N/A 02/13/2022   Procedure: RIGHT HEART CATH;  Surgeon: Verlin Lonni BIRCH, MD;  Location: [facility] INVASIVE CV LAB;  Service: Cardiovascular;  Laterality: N/A;    Social History   Socioeconomic History   Marital status: Married    Spouse name: Dawn   Number of children: 1   Years of education: 14   Highest education level: Some college, no degree  Occupational History   Occupation: Disabled  Tobacco Use   Smoking status: Former    Current packs/day: 0.00    Average packs/day: 1 pack/day for 28.0 years (28.0 ttl pk-yrs)    Types: Cigarettes    Start date: 95    Quit date: 2010    Years since quitting: 15.9   Smokeless tobacco: Never  Vaping Use   Vaping status: Never Used  Substance and Sexual Activity   Alcohol use: No   Drug use: No    Comment: 04/06/2018 Patient's wife wanted us  to be very aware that her husband is a recovering narcotic abuser   Sexual activity: Yes  Other Topics Concern   Not on file  Social  History Narrative   Lives with his wife, daughter, son-in-law and 2 two grandchildren. Likes to spend to time with grandchildren. Does not exercise due to back issues in the past and respiratory problems.   Social Drivers of Corporate Investment Banker Strain: Low Risk  (02/04/2022)   Overall Financial Resource Strain (CARDIA)    Difficulty of Paying Living Expenses: Not hard at all  Food Insecurity: Low Risk  (03/18/2024)   Received from [facility]   Hunger Vital Sign    Within the past 12 months, you worried that your food would run out before you got money to buy more: Never true    Within the past 12 months, the food you bought just didn't last and you didn't have money to get more. : Never true  Transportation Needs: No Transportation Needs (03/18/2024)   Received from [facility]    In the past 12 months, has lack of reliable transportation kept you from medical appointments, meetings, work or from getting things needed for daily living? : No  Physical Activity: Inactive (02/04/2022)   Exercise Vital Sign    Days of Exercise per Week: 0 days    Minutes of Exercise per Session: 0 min  Stress: No Stress Concern Present (02/04/2022)   [employer] of Occupational Health - Occupational Stress Questionnaire    Feeling of Stress : Not at all  Social Connections: Moderately Integrated (02/04/2022)   Social Connection and Isolation Panel    Frequency of Communication with Friends and Family: Once a week    Frequency of Social Gatherings with Friends and Family: More than three times a week    Attends Religious Services: More than 4 times per year    Active Member of [employer] or Organizations: No    Attends Banker Meetings: Never    Marital Status: Married  Catering Manager Violence: Not At Risk (02/04/2022)   Humiliation, Afraid, Rape, and Kick questionnaire    Fear of Current or Ex-Partner: No    Emotionally Abused: No    Physically Abused: No     Sexually Abused: No    Family History  Problem Relation Age of Onset   Cancer Mother        breast   Depression Mother    Hyperlipidemia Mother    Dementia  Mother    Alcohol abuse Father    Cancer Father        skin   Hyperlipidemia Father    Alzheimer's disease Father    Cancer Maternal Grandfather    Cancer Paternal Grandmother    Pancreatic cancer Paternal Uncle    Colon cancer Neg Hx    Esophageal cancer Neg Hx    Stomach cancer Neg Hx     ROS: no fevers or chills, productive cough, hemoptysis, dysphasia, odynophagia, melena, hematochezia, dysuria, hematuria, rash, seizure activity, orthopnea, PND, pedal edema, claudication. Remaining systems are negative.  Physical Exam: Well-developed well-nourished in no acute distress.  Skin is warm and dry.  HEENT is normal.  Neck is supple.  Chest is clear to auscultation with normal expansion.  Cardiovascular exam is regular rate and rhythm.  Abdominal exam nontender or distended. No masses palpated. Extremities show no edema. neuro grossly intact  ECG- personally reviewed  A/P  1 coronary artery disease-plan to continue aspirin .  Intolerant to statins.  Patient denies chest pain.  2 chronic systolic congestive heart failure-continue Entresto , Toprol , Jardiance  and spironolactone .  Will continue torsemide  at present dose.  3 hyperlipidemia-continue Zetia .  Intolerant to Repatha  and statins.  4 hypertension-patient's blood pressure is controlled.  Continue present medical regimen.  5 ICD-per EP.  6 obstructive sleep apnea-continue CPAP.  7 interstitial lung disease-managed by pulmonary.  8 paroxysmal atrial fibrillation-patient is presently being treated with amiodarone .  I do not think this is a good choice given underlying interstitial lung disease.  Redell Shallow, MD

## 2024-08-16 ENCOUNTER — Ambulatory Visit: Admitting: Cardiology

## 2024-08-21 ENCOUNTER — Ambulatory Visit: Admitting: Cardiology

## 2024-09-07 DEATH — deceased

## 2024-10-23 ENCOUNTER — Ambulatory Visit: Admitting: Internal Medicine

## 2025-01-12 ENCOUNTER — Ambulatory Visit

## 2025-04-13 ENCOUNTER — Ambulatory Visit

## 2025-07-13 ENCOUNTER — Ambulatory Visit

## 2025-10-12 ENCOUNTER — Ambulatory Visit
